# Patient Record
Sex: Female | Born: 1950
Health system: Southern US, Community
[De-identification: ages and names within clinical notes are randomized; demographics above are authoritative.]

## PROBLEM LIST (undated history)

## (undated) DIAGNOSIS — I739 Peripheral vascular disease, unspecified: Secondary | ICD-10-CM

## (undated) DIAGNOSIS — J449 Chronic obstructive pulmonary disease, unspecified: Secondary | ICD-10-CM

## (undated) DIAGNOSIS — I2510 Atherosclerotic heart disease of native coronary artery without angina pectoris: Secondary | ICD-10-CM

## (undated) DIAGNOSIS — E785 Hyperlipidemia, unspecified: Secondary | ICD-10-CM

## (undated) DIAGNOSIS — N189 Chronic kidney disease, unspecified: Secondary | ICD-10-CM

## (undated) DIAGNOSIS — E119 Type 2 diabetes mellitus without complications: Secondary | ICD-10-CM

## (undated) DIAGNOSIS — C55 Malignant neoplasm of uterus, part unspecified: Secondary | ICD-10-CM

## (undated) DIAGNOSIS — I10 Essential (primary) hypertension: Secondary | ICD-10-CM

## (undated) DIAGNOSIS — K317 Polyp of stomach and duodenum: Secondary | ICD-10-CM

## (undated) DIAGNOSIS — K2940 Chronic atrophic gastritis without bleeding: Secondary | ICD-10-CM

## (undated) DIAGNOSIS — G4730 Sleep apnea, unspecified: Secondary | ICD-10-CM

## (undated) DIAGNOSIS — D649 Anemia, unspecified: Secondary | ICD-10-CM

## (undated) DIAGNOSIS — I209 Angina pectoris, unspecified: Secondary | ICD-10-CM

## (undated) DIAGNOSIS — G4733 Obstructive sleep apnea (adult) (pediatric): Secondary | ICD-10-CM

## (undated) HISTORY — DX: Type 2 diabetes mellitus without complications: E11.9

## (undated) HISTORY — DX: Essential (primary) hypertension: I10

## (undated) HISTORY — PX: ESOPHAGOGASTRODUODENOSCOPY: SHX1529

## (undated) HISTORY — DX: Obstructive sleep apnea (adult) (pediatric): G47.33

## (undated) HISTORY — DX: Peripheral vascular disease, unspecified: I73.9

## (undated) HISTORY — DX: Polyp of stomach and duodenum: K31.7

## (undated) HISTORY — DX: Hyperlipidemia, unspecified: E78.5

## (undated) HISTORY — DX: Atherosclerotic heart disease of native coronary artery without angina pectoris: I25.10

## (undated) HISTORY — PX: RENAL ARTERY STENT: SHX2321

## (undated) HISTORY — DX: Malignant neoplasm of uterus, part unspecified: C55

## (undated) HISTORY — DX: Anemia, unspecified: D64.9

## (undated) HISTORY — PX: TOTAL ABDOMINAL HYSTERECTOMY: SHX209

---
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
## 1997-09-10 HISTORY — PX: CORONARY ANGIOPLASTY WITH STENT PLACEMENT: SHX49

## 1998-01-03 ENCOUNTER — Ambulatory Visit (HOSPITAL_COMMUNITY): Admission: RE | Admit: 1998-01-03 | Discharge: 1998-01-03 | Payer: Self-pay | Admitting: Cardiology

## 1998-01-07 ENCOUNTER — Observation Stay (HOSPITAL_COMMUNITY): Admission: AD | Admit: 1998-01-07 | Discharge: 1998-01-08 | Payer: Self-pay | Admitting: Cardiology

## 1998-09-10 HISTORY — PX: COLONOSCOPY: SHX174

## 2001-11-10 ENCOUNTER — Encounter (INDEPENDENT_AMBULATORY_CARE_PROVIDER_SITE_OTHER): Payer: Self-pay | Admitting: *Deleted

## 2001-11-10 LAB — CONVERTED CEMR LAB

## 2003-11-09 ENCOUNTER — Other Ambulatory Visit: Admission: RE | Admit: 2003-11-09 | Discharge: 2003-11-09 | Payer: Self-pay | Admitting: Family Medicine

## 2003-11-11 ENCOUNTER — Other Ambulatory Visit: Admission: RE | Admit: 2003-11-11 | Discharge: 2003-11-11 | Payer: Self-pay | Admitting: Family Medicine

## 2005-01-09 ENCOUNTER — Ambulatory Visit: Payer: Self-pay | Admitting: Internal Medicine

## 2005-01-17 ENCOUNTER — Other Ambulatory Visit: Admission: RE | Admit: 2005-01-17 | Discharge: 2005-01-17 | Payer: Self-pay | Admitting: Family Medicine

## 2005-01-17 ENCOUNTER — Ambulatory Visit: Payer: Self-pay | Admitting: Family Medicine

## 2005-04-20 ENCOUNTER — Ambulatory Visit: Payer: Self-pay | Admitting: Internal Medicine

## 2005-05-24 ENCOUNTER — Ambulatory Visit: Payer: Self-pay | Admitting: Internal Medicine

## 2005-06-12 ENCOUNTER — Encounter: Admission: RE | Admit: 2005-06-12 | Discharge: 2005-09-10 | Payer: Self-pay | Admitting: Internal Medicine

## 2005-07-25 ENCOUNTER — Ambulatory Visit: Payer: Self-pay | Admitting: Family Medicine

## 2005-09-12 ENCOUNTER — Ambulatory Visit: Payer: Self-pay | Admitting: Internal Medicine

## 2005-10-23 ENCOUNTER — Ambulatory Visit: Payer: Self-pay | Admitting: Internal Medicine

## 2005-10-31 ENCOUNTER — Ambulatory Visit: Payer: Self-pay

## 2005-12-30 ENCOUNTER — Emergency Department (HOSPITAL_COMMUNITY): Admission: EM | Admit: 2005-12-30 | Discharge: 2005-12-30 | Payer: Self-pay | Admitting: Emergency Medicine

## 2006-01-02 ENCOUNTER — Ambulatory Visit: Payer: Self-pay | Admitting: Internal Medicine

## 2006-01-04 ENCOUNTER — Ambulatory Visit: Payer: Self-pay | Admitting: Internal Medicine

## 2006-02-06 ENCOUNTER — Encounter: Admission: RE | Admit: 2006-02-06 | Discharge: 2006-02-06 | Payer: Self-pay | Admitting: Cardiovascular Disease

## 2006-02-12 ENCOUNTER — Inpatient Hospital Stay (HOSPITAL_COMMUNITY): Admission: AD | Admit: 2006-02-12 | Discharge: 2006-02-13 | Payer: Self-pay | Admitting: Cardiovascular Disease

## 2006-02-22 ENCOUNTER — Ambulatory Visit: Payer: Self-pay | Admitting: Internal Medicine

## 2006-02-28 ENCOUNTER — Ambulatory Visit: Payer: Self-pay | Admitting: Internal Medicine

## 2006-06-20 ENCOUNTER — Ambulatory Visit: Payer: Self-pay | Admitting: Internal Medicine

## 2006-08-14 ENCOUNTER — Encounter: Admission: RE | Admit: 2006-08-14 | Discharge: 2006-08-14 | Payer: Self-pay | Admitting: Cardiovascular Disease

## 2006-08-20 ENCOUNTER — Ambulatory Visit (HOSPITAL_COMMUNITY): Admission: RE | Admit: 2006-08-20 | Discharge: 2006-08-21 | Payer: Self-pay | Admitting: Cardiovascular Disease

## 2006-08-28 ENCOUNTER — Ambulatory Visit: Payer: Self-pay | Admitting: Internal Medicine

## 2006-08-28 LAB — CONVERTED CEMR LAB
BUN: 20 mg/dL (ref 6–23)
Creatinine, Ser: 1.4 mg/dL — ABNORMAL HIGH (ref 0.4–1.2)
Potassium: 4.2 meq/L (ref 3.5–5.1)

## 2006-09-05 ENCOUNTER — Ambulatory Visit: Payer: Self-pay | Admitting: Internal Medicine

## 2006-10-21 ENCOUNTER — Ambulatory Visit: Payer: Self-pay | Admitting: Internal Medicine

## 2006-10-21 LAB — CONVERTED CEMR LAB: Hgb A1c MFr Bld: 5.7 % (ref 4.6–6.0)

## 2006-10-31 ENCOUNTER — Ambulatory Visit: Payer: Self-pay | Admitting: Internal Medicine

## 2007-03-25 ENCOUNTER — Encounter: Payer: Self-pay | Admitting: Internal Medicine

## 2007-04-10 ENCOUNTER — Encounter: Payer: Self-pay | Admitting: Internal Medicine

## 2007-04-22 ENCOUNTER — Encounter: Admission: RE | Admit: 2007-04-22 | Discharge: 2007-04-22 | Payer: Self-pay | Admitting: Cardiovascular Disease

## 2007-04-28 ENCOUNTER — Inpatient Hospital Stay (HOSPITAL_COMMUNITY): Admission: AD | Admit: 2007-04-28 | Discharge: 2007-04-29 | Payer: Self-pay | Admitting: Cardiovascular Disease

## 2007-05-21 ENCOUNTER — Encounter: Payer: Self-pay | Admitting: Internal Medicine

## 2007-06-04 ENCOUNTER — Encounter: Payer: Self-pay | Admitting: Internal Medicine

## 2007-07-07 DIAGNOSIS — E1151 Type 2 diabetes mellitus with diabetic peripheral angiopathy without gangrene: Secondary | ICD-10-CM | POA: Insufficient documentation

## 2007-07-07 DIAGNOSIS — Z8542 Personal history of malignant neoplasm of other parts of uterus: Secondary | ICD-10-CM | POA: Insufficient documentation

## 2007-07-07 DIAGNOSIS — D649 Anemia, unspecified: Secondary | ICD-10-CM | POA: Insufficient documentation

## 2007-07-07 DIAGNOSIS — I10 Essential (primary) hypertension: Secondary | ICD-10-CM

## 2007-07-24 ENCOUNTER — Encounter: Payer: Self-pay | Admitting: Internal Medicine

## 2007-09-18 ENCOUNTER — Encounter: Payer: Self-pay | Admitting: Internal Medicine

## 2007-10-09 ENCOUNTER — Encounter (INDEPENDENT_AMBULATORY_CARE_PROVIDER_SITE_OTHER): Payer: Self-pay | Admitting: *Deleted

## 2007-10-09 DIAGNOSIS — I219 Acute myocardial infarction, unspecified: Secondary | ICD-10-CM | POA: Insufficient documentation

## 2007-10-09 DIAGNOSIS — N259 Disorder resulting from impaired renal tubular function, unspecified: Secondary | ICD-10-CM

## 2007-12-07 ENCOUNTER — Telehealth (INDEPENDENT_AMBULATORY_CARE_PROVIDER_SITE_OTHER): Payer: Self-pay | Admitting: *Deleted

## 2007-12-10 ENCOUNTER — Telehealth (INDEPENDENT_AMBULATORY_CARE_PROVIDER_SITE_OTHER): Payer: Self-pay | Admitting: *Deleted

## 2008-02-03 ENCOUNTER — Encounter: Payer: Self-pay | Admitting: Internal Medicine

## 2008-04-06 ENCOUNTER — Ambulatory Visit: Payer: Self-pay | Admitting: Internal Medicine

## 2008-04-06 DIAGNOSIS — R0989 Other specified symptoms and signs involving the circulatory and respiratory systems: Secondary | ICD-10-CM

## 2008-04-06 DIAGNOSIS — R609 Edema, unspecified: Secondary | ICD-10-CM

## 2008-04-07 ENCOUNTER — Ambulatory Visit: Payer: Self-pay | Admitting: Internal Medicine

## 2008-04-10 LAB — CONVERTED CEMR LAB
BUN: 16 mg/dL (ref 6–23)
Basophils Absolute: 0.1 10*3/uL (ref 0.0–0.1)
Basophils Relative: 1.6 % (ref 0.0–3.0)
Eosinophils Absolute: 0.2 10*3/uL (ref 0.0–0.7)
Hemoglobin: 12.7 g/dL (ref 12.0–15.0)
MCHC: 33.6 g/dL (ref 30.0–36.0)
MCV: 87 fL (ref 78.0–100.0)
Monocytes Absolute: 0.3 10*3/uL (ref 0.1–1.0)
Neutro Abs: 1.2 10*3/uL — ABNORMAL LOW (ref 1.4–7.7)
RBC: 4.34 M/uL (ref 3.87–5.11)
TSH: 1.6 microintl units/mL (ref 0.35–5.50)

## 2008-04-12 ENCOUNTER — Encounter (INDEPENDENT_AMBULATORY_CARE_PROVIDER_SITE_OTHER): Payer: Self-pay | Admitting: *Deleted

## 2008-04-21 ENCOUNTER — Ambulatory Visit: Payer: Self-pay | Admitting: Internal Medicine

## 2008-04-21 ENCOUNTER — Encounter (INDEPENDENT_AMBULATORY_CARE_PROVIDER_SITE_OTHER): Payer: Self-pay | Admitting: *Deleted

## 2008-04-21 LAB — CONVERTED CEMR LAB: OCCULT 3: NEGATIVE

## 2008-05-28 ENCOUNTER — Encounter: Payer: Self-pay | Admitting: Internal Medicine

## 2008-06-24 ENCOUNTER — Encounter: Payer: Self-pay | Admitting: Internal Medicine

## 2008-06-28 ENCOUNTER — Telehealth (INDEPENDENT_AMBULATORY_CARE_PROVIDER_SITE_OTHER): Payer: Self-pay | Admitting: *Deleted

## 2008-07-15 ENCOUNTER — Encounter: Payer: Self-pay | Admitting: Internal Medicine

## 2008-07-26 ENCOUNTER — Ambulatory Visit: Payer: Self-pay | Admitting: Internal Medicine

## 2008-08-01 LAB — CONVERTED CEMR LAB
BUN: 12 mg/dL (ref 6–23)
Creatinine,U: 91 mg/dL
Microalb, Ur: 0.2 mg/dL (ref 0.0–1.9)

## 2008-08-02 ENCOUNTER — Ambulatory Visit: Payer: Self-pay | Admitting: Internal Medicine

## 2008-11-17 ENCOUNTER — Encounter: Payer: Self-pay | Admitting: Internal Medicine

## 2008-11-17 ENCOUNTER — Ambulatory Visit: Payer: Self-pay | Admitting: Internal Medicine

## 2008-11-30 ENCOUNTER — Ambulatory Visit: Payer: Self-pay | Admitting: Internal Medicine

## 2008-12-07 LAB — CONVERTED CEMR LAB
Creatinine,U: 69.9 mg/dL
Hgb A1c MFr Bld: 6.2 % (ref 4.6–6.5)
Microalb, Ur: 0.2 mg/dL (ref 0.0–1.9)
Potassium: 4.3 meq/L (ref 3.5–5.1)

## 2008-12-09 ENCOUNTER — Encounter (INDEPENDENT_AMBULATORY_CARE_PROVIDER_SITE_OTHER): Payer: Self-pay | Admitting: *Deleted

## 2008-12-30 ENCOUNTER — Encounter: Payer: Self-pay | Admitting: Internal Medicine

## 2009-01-25 ENCOUNTER — Telehealth (INDEPENDENT_AMBULATORY_CARE_PROVIDER_SITE_OTHER): Payer: Self-pay | Admitting: *Deleted

## 2009-04-04 ENCOUNTER — Ambulatory Visit: Payer: Self-pay | Admitting: Internal Medicine

## 2009-04-05 ENCOUNTER — Encounter (INDEPENDENT_AMBULATORY_CARE_PROVIDER_SITE_OTHER): Payer: Self-pay | Admitting: *Deleted

## 2009-04-11 ENCOUNTER — Encounter (INDEPENDENT_AMBULATORY_CARE_PROVIDER_SITE_OTHER): Payer: Self-pay | Admitting: *Deleted

## 2009-04-11 LAB — CONVERTED CEMR LAB
Creatinine, Ser: 1.3 mg/dL — ABNORMAL HIGH (ref 0.4–1.2)
Hgb A1c MFr Bld: 6 % (ref 4.6–6.5)
Potassium: 4 meq/L (ref 3.5–5.1)

## 2009-06-06 ENCOUNTER — Encounter: Payer: Self-pay | Admitting: Internal Medicine

## 2009-06-23 ENCOUNTER — Encounter: Payer: Self-pay | Admitting: Internal Medicine

## 2009-10-03 ENCOUNTER — Ambulatory Visit: Payer: Self-pay | Admitting: Internal Medicine

## 2009-10-07 LAB — CONVERTED CEMR LAB
Creatinine, Ser: 1.3 mg/dL — ABNORMAL HIGH (ref 0.4–1.2)
Creatinine,U: 41.6 mg/dL
Hgb A1c MFr Bld: 6.3 % (ref 4.6–6.5)
Microalb, Ur: 0.2 mg/dL (ref 0.0–1.9)

## 2009-10-14 ENCOUNTER — Ambulatory Visit: Payer: Self-pay | Admitting: Internal Medicine

## 2009-10-14 DIAGNOSIS — F17200 Nicotine dependence, unspecified, uncomplicated: Secondary | ICD-10-CM | POA: Insufficient documentation

## 2010-01-11 ENCOUNTER — Encounter: Payer: Self-pay | Admitting: Internal Medicine

## 2010-02-09 ENCOUNTER — Telehealth (INDEPENDENT_AMBULATORY_CARE_PROVIDER_SITE_OTHER): Payer: Self-pay | Admitting: *Deleted

## 2010-02-16 ENCOUNTER — Ambulatory Visit: Payer: Self-pay | Admitting: Internal Medicine

## 2010-02-16 LAB — CONVERTED CEMR LAB
ALT: 16 units/L (ref 0–35)
BUN: 15 mg/dL (ref 6–23)
Bilirubin, Direct: 0.1 mg/dL (ref 0.0–0.3)
CO2: 30 meq/L (ref 19–32)
Calcium: 9.3 mg/dL (ref 8.4–10.5)
Cholesterol: 156 mg/dL (ref 0–200)
GFR calc non Af Amer: 57.64 mL/min (ref >60)
Glucose, Bld: 106 mg/dL — ABNORMAL HIGH (ref 70–99)
HDL: 63.7 mg/dL (ref >39.00)
LDL Cholesterol: 77 mg/dL (ref 0–99)
Microalb Creat Ratio: 0.2 mg/g (ref 0.0–30.0)
Total Bilirubin: 0.6 mg/dL (ref 0.3–1.2)
Total CHOL/HDL Ratio: 2
Triglycerides: 76 mg/dL (ref 0.0–149.0)

## 2010-02-23 ENCOUNTER — Ambulatory Visit: Payer: Self-pay | Admitting: Internal Medicine

## 2010-02-23 DIAGNOSIS — K219 Gastro-esophageal reflux disease without esophagitis: Secondary | ICD-10-CM

## 2010-06-09 ENCOUNTER — Encounter: Payer: Self-pay | Admitting: Internal Medicine

## 2010-08-23 ENCOUNTER — Ambulatory Visit: Payer: Self-pay | Admitting: Internal Medicine

## 2010-08-29 LAB — CONVERTED CEMR LAB
Hgb A1c MFr Bld: 6.5 % (ref 4.6–6.5)
Potassium: 4.6 meq/L (ref 3.5–5.1)

## 2010-08-30 ENCOUNTER — Ambulatory Visit: Payer: Self-pay | Admitting: Internal Medicine

## 2010-09-19 ENCOUNTER — Encounter: Payer: Self-pay | Admitting: Cardiology

## 2010-09-19 ENCOUNTER — Ambulatory Visit
Admission: RE | Admit: 2010-09-19 | Discharge: 2010-09-19 | Payer: Self-pay | Source: Home / Self Care | Attending: Cardiology | Admitting: Cardiology

## 2010-09-19 DIAGNOSIS — I2510 Atherosclerotic heart disease of native coronary artery without angina pectoris: Secondary | ICD-10-CM | POA: Insufficient documentation

## 2010-10-10 NOTE — Letter (Signed)
Summary: [facility]  [facility]   Imported By: Lanelle Bal 01/25/2010 10:08:59  _____________________________________________________________________  External Attachment:    Type:   Image     Comment:   External Document

## 2010-10-10 NOTE — Assessment & Plan Note (Signed)
Summary: rov by pt..lh   Vital Signs:  Patient profile:   60 year old female Weight:      179.6 pounds BMI:     31.68 Pulse rate:   64 / minute Resp:     17 per minute BP sitting:   130 / 68  (left arm) Cuff size:   large  Vitals Entered By: Shonna Chock (October 14, 2009 2:58 PM) CC: Follow-up visit: Discuss labs  Comments REVIEWED MED LIST, PATIENT AGREED DOSE AND INSTRUCTION CORRECT    CC:  Follow-up visit: Discuss labs .  History of Present Illness: Labs reviewed & risks discussed. She is not checking glucoses because no  working machine .She states she only smoking 2 / day  but she lives with smoker.  Allergies (verified): No Known Drug Allergies  Review of Systems General:  Denies fatigue and weight loss. CV:  Denies chest pain or discomfort, lightheadness, and near fainting. Derm:  Denies poor wound healing. Neuro:  Denies numbness and tingling. Endo:  Denies excessive hunger, excessive thirst, and excessive urination.  Physical Exam  General:  well-nourished,in no acute distress; alert,appropriate and cooperative throughout examination; heavy tobacco odor  Lungs:  Normal respiratory effort, chest expands symmetrically. Lungs are clear to auscultation, no crackles or wheezes. Heart:  Normal rate and regular rhythm. S1 and S2 normal without gallop, murmur, click, rub. S4 with slurring Abdomen:  Bowel sounds positive,abdomen soft and non-tender without masses, organomegaly or hernias noted. No AAA or bruits Pulses:  R and L carotid,radial,dorsalis pedis and posterior tibial pulses are full and equal bilaterally. Bilateral carotid bruits Extremities:  No clubbing, cyanosis, edema.   Impression & Recommendations:  Problem # 1:  Hx of RENAL INSUFFICIENCY (ICD-588.9)  stable  Orders: Tobacco use cessation intermediate 3-10 minutes (04540)  Problem # 2:  DIABETES MELLITUS, TYPE II (ICD-250.00)  Slight progression Her updated medication list for this problem  includes:    Metformin Hcl 500 Mg Xr24h-tab (Metformin hcl) .Marland KitchenMarland KitchenMarland KitchenMarland Kitchen 1by mouth  once  a day with largest meal    Glimepiride 2 Mg Tabs (Glimepiride) .Marland Kitchen... 1/2  by mouth once daily    Baby Aspirin 81 Mg Chew (Aspirin) .Marland Kitchen... Chew 1 tablet by mouth qd    Altace 10 Mg Tabs (Ramipril) .Marland Kitchen... 1 by mouth once daily  Orders: Tobacco use cessation intermediate 3-10 minutes (98119)  Problem # 3:  HYPERTENSION (ICD-401.9)  controlled Her updated medication list for this problem includes:    Metoprolol Tartrate 25 Mg Tabs (Metoprolol tartrate) .Marland Kitchen... 1 by mouth two times a day    Altace 10 Mg Tabs (Ramipril) .Marland Kitchen... 1 by mouth once daily  Orders: Tobacco use cessation intermediate 3-10 minutes (14782)  Complete Medication List: 1)  Metformin Hcl 500 Mg Xr24h-tab (Metformin hcl) .Marland Kitchen.. 1by mouth  once  a day with largest meal 2)  Glimepiride 2 Mg Tabs (Glimepiride) .... 1/2  by mouth once daily 3)  Multivitamins Tabs (Multiple vitamin) .... Take 1 tablet by mouth daily 4)  Baby Aspirin 81 Mg Chew (Aspirin) .... Chew 1 tablet by mouth qd 5)  Metoprolol Tartrate 25 Mg Tabs (Metoprolol tartrate) .Marland Kitchen.. 1 by mouth two times a day 6)  Altace 10 Mg Tabs (Ramipril) .Marland Kitchen.. 1 by mouth once daily 7)  Plavix 75 Mg Tabs (Clopidogrel bisulfate) 8)  Simvastatin 80 Mg Tabs (Simvastatin) .Marland Kitchen.. 1 by mouth at bedtime 9)  Caltrate  10)  Niacin Cr 500 Mg Cr-caps (Niacin) .Marland Kitchen.. 1 by mouth at bedtime  Patient  Instructions: 1)  Eat brown carbs (yams, wheat pasta , wild rice & whole grained breads)  instead of whites (potatoes, rice , bread & pasta). Avoid ALL foods & drinks with High Fructose Corn Syrup as #1,2 or #3 on label. 2)  Please schedule a follow-up appointment in 4 months. 3)  BUN,creat, K+ prior to visit, ICD-9: 4)  HbgA1C prior to visit, ICD-9: 5)  Urine Microalbumin prior to visit, ICD-9: 6)  Stop Smoking Tips: Choose a Quit date. Cut down before the Quit date. decide what you will do as a substitute when you feel  the urge to smoke(gum,toothpick,exercise). Prescriptions: GLIMEPIRIDE 2 MG  TABS (GLIMEPIRIDE) 1/2  by mouth once daily  #90 Tablet x 0   Entered and Authorized by:   Marga Melnick MD   Signed by:   Marga Melnick MD on 10/14/2009   Method used:   Print then Give to Patient   RxID:   [id number] METFORMIN HCL 500 MG XR24H-TAB (METFORMIN HCL) 1BY MOUTH  ONCE  a day WITH LARGEST MEAL  #180 x 1   Entered and Authorized by:   Marga Melnick MD   Signed by:   Marga Melnick MD on 10/14/2009   Method used:   Print then Give to Patient   RxID:   [id number]

## 2010-10-10 NOTE — Assessment & Plan Note (Signed)
Summary: 4 mth fu/kdc   Vital Signs:  Patient profile:   60 year old female Weight:      179.2 pounds Pulse rate:   72 / minute Resp:     16 per minute BP sitting:   148 / 70  (left arm) Cuff size:   large  Vitals Entered By: Shonna Chock (February 23, 2010 10:16 AM) CC: 4 Month follow-up (copy of labs given), Hypertension Management Comments REVIEWED MED LIST, PATIENT AGREED DOSE AND INSTRUCTION CORRECT    CC:  4 Month follow-up (copy of labs given) and Hypertension Management.  History of Present Illness: Labs reviewed & risks discussed. A1c is 6.1%; it was 6.3% in 09/2009. Creat now 1.2 ; it was 1.3. Dr Allyson Sabal changed Altace to Lisinopril 10 mg. Some "heartburn " X 3 weeks intermittently. Triggers for ERD reviewed.  Hypertension History:      She denies headache, chest pain, palpitations, dyspnea with exertion, orthopnea, PND, peripheral edema, visual symptoms, neurologic problems, and syncope.        Positive major cardiovascular risk factors include female age 72 years old or older, diabetes, hypertension, and current tobacco user.        Positive history for target organ damage include ASHD (either angina/prior MI/prior CABG).     Preventive Screening-Counseling & Management  Alcohol-Tobacco     Smoking Status: current     Smoking Cessation Counseling: yes     Packs/Day: <0.25     Tobacco Counseling: to quit use of tobacco products  Caffeine-Diet-Exercise     Caffeine use/day: 2-3 cups / day  Comments: Cardiovascular  & GI risks discussed  Allergies (verified): No Known Drug Allergies  Social History: Packs/Day:  <0.25 Caffeine use/day:  2-3 cups / day  Review of Systems Eyes:  Denies blurring, double vision, and vision loss-both eyes. ENT:  Denies difficulty swallowing and hoarseness. CV:  Denies leg cramps with exertion, lightheadness, and near fainting. GI:  Complains of indigestion; denies abdominal pain, bloody stools, and dark tarry stools; Vinegar taken as  needed for dyspepsia. Derm:  Denies poor wound healing. Neuro:  Denies numbness and tingling.  Physical Exam  General:  well-nourished,in no acute distress; alert,appropriate and cooperative throughout examination Eyes:  No corneal or conjunctival inflammation noted.No icterus Lungs:  Normal respiratory effort, chest expands symmetrically. Lungs are clear to auscultation, no crackles or wheezes. Heart:  Normal rate and regular rhythm. S1 and S2 normal without gallop, murmur, click, rub . S4 Abdomen:  Bowel sounds positive,abdomen soft and non-tender without masses, organomegaly or hernias noted. Pulses:  R and L carotid,radial  pulses are full and equal bilaterally. Pedal pulses decreased Extremities:  No clubbing, cyanosis, edema. Neurologic:  alert & oriented X3 and sensation intact to light touch over feet .   Skin:  Intact without suspicious lesions or rashes Cervical Nodes:  No lymphadenopathy noted Axillary Nodes:  No palpable lymphadenopathy Psych:  memory intact for recent and remote, normally interactive, and good eye contact.     Impression & Recommendations:  Problem # 1:  DIABETES MELLITUS, TYPE II (ICD-250.00)  The following medications were removed from the medication list:    Glimepiride 2 Mg Tabs (Glimepiride) .Marland Kitchen... 1/2  by mouth once daily    Altace 10 Mg Tabs (Ramipril) .Marland Kitchen... 1 by mouth once daily Her updated medication list for this problem includes:    Metformin Hcl 500 Mg Xr24h-tab (Metformin hcl) .Marland KitchenMarland KitchenMarland KitchenMarland Kitchen 1by mouth  once  a day with largest meal    Baby  Aspirin 81 Mg Chew (Aspirin) .Marland Kitchen... Chew 1 tablet by mouth qd    Lisinopril 20 Mg Tabs (Lisinopril) .Marland Kitchen... 1 once daily  Problem # 2:  Hx of RENAL INSUFFICIENCY (ICD-588.9) resolved  Problem # 3:  HYPERTENSION (ICD-401.9) suboptimal control The following medications were removed from the medication list:    Altace 10 Mg Tabs (Ramipril) .Marland Kitchen... 1 by mouth once daily Her updated medication list for this problem  includes:    Metoprolol Tartrate 25 Mg Tabs (Metoprolol tartrate) .Marland Kitchen... 1 by mouth two times a day    Lisinopril 20 Mg Tabs (Lisinopril) .Marland Kitchen... 1 once daily  Problem # 4:  GERD (ICD-530.81)  Her updated medication list for this problem includes:    Ranitidine Hcl 150 Mg Tabs (Ranitidine hcl) .Marland Kitchen... 1 every 12 hrs pre meals as needed for heartburn  Problem # 5:  NONDEPENDENT TOBACCO USE DISORDER (ICD-305.1)  Complete Medication List: 1)  Metformin Hcl 500 Mg Xr24h-tab (Metformin hcl) .Marland Kitchen.. 1by mouth  once  a day with largest meal 2)  Multivitamins Tabs (Multiple vitamin) .... Take 1 tablet by mouth daily 3)  Baby Aspirin 81 Mg Chew (Aspirin) .... Chew 1 tablet by mouth qd 4)  Metoprolol Tartrate 25 Mg Tabs (Metoprolol tartrate) .Marland Kitchen.. 1 by mouth two times a day 5)  Plavix 75 Mg Tabs (Clopidogrel bisulfate) 6)  Simvastatin 80 Mg Tabs (Simvastatin) .Marland Kitchen.. 1 by mouth at bedtime 7)  Caltrate  8)  Niacin Cr 500 Mg Cr-caps (Niacin) .Marland Kitchen.. 1 by mouth at bedtime 9)  Lisinopril 20 Mg Tabs (Lisinopril) .Marland Kitchen.. 1 once daily 10)  Ranitidine Hcl 150 Mg Tabs (Ranitidine hcl) .Marland Kitchen.. 1 every 12 hrs pre meals as needed for heartburn  Hypertension Assessment/Plan:      The patient's hypertensive risk group is category C: Target organ damage and/or diabetes.  Today's blood pressure is 148/70.    Patient Instructions: 1)  Please schedule a follow-up appointment in 6 months. 2)  Tobacco is very bad for your health and your loved ones as we discussed 3)  Stop Smoking Tips: Choose a Quit date. Cut down before the Quit date. decide what you will do as a substitute when you feel the urge to smoke(gum,toothpick,exercise). 4)  BUN,creat, K+ prior to visit, ICD-9:401.9 5)  HbgA1C prior to visit, ICD-9:250.00 6)  Avoid foods high in acid (tomatoes, citrus juices, spicy foods). Avoid eating within two hours of lying down or before exercising. Do not over eat; try smaller more frequent meals. Elevate head of bed twelve inches  when sleeping. Prescriptions: RANITIDINE HCL 150 MG TABS (RANITIDINE HCL) 1 every 12 hrs pre meals as needed for heartburn  #60 x 1   Entered and Authorized by:   Marga Melnick MD   Signed by:   Marga Melnick MD on 02/23/2010   Method used:   Print then Give to Patient   RxID:   [id number] LISINOPRIL 20 MG TABS (LISINOPRIL) 1 once daily  #90 x 1   Entered and Authorized by:   Marga Melnick MD   Signed by:   Marga Melnick MD on 02/23/2010   Method used:   Print then Give to Patient   RxID:   [id number]

## 2010-10-10 NOTE — Progress Notes (Signed)
Summary: Refill Request  Phone Note Refill Request Call back at [phone] Message from:  Pharmacy on February 09, 2010 2:44 PM  Refills Requested: Medication #1:  GLIMEPIRIDE 2 MG  TABS 1/2  by mouth once daily   Dosage confirmed as above?Dosage Confirmed   Supply Requested: 1 month   Last Refilled: 11/14/2009 [facility] on [address] in [employer]  Next Appointment Scheduled: 6.16.11 Initial call taken by: Harold Barban,  February 09, 2010 2:44 PM    Prescriptions: GLIMEPIRIDE 2 MG  TABS (GLIMEPIRIDE) 1/2  by mouth once daily  #90 Tablet x 0   Entered by:   Shonna Chock   Authorized by:   Marga Melnick MD   Signed by:   Shonna Chock on 02/09/2010   Method used:   Electronically to        [facility] [id number] (retail)       [address] [place], [place]  [address]       Ph: [phone] or [phone]       Fax: [phone]   RxID:   [id number]

## 2010-10-12 NOTE — Assessment & Plan Note (Signed)
Summary: rto 6 months/cbs   Vital Signs:  Patient profile:   60 year old female Weight:      175.2 pounds BMI:     30.90 Pulse rate:   76 / minute Resp:     15 per minute BP sitting:   130 / 68  (left arm) Cuff size:   large  Vitals Entered By: Shonna Chock CMA (August 30, 2010 9:48 AM) CC: 6 month follow-up (copy of labs given) , Type 2 diabetes mellitus follow-up   CC:  6 month follow-up (copy of labs given)  and Type 2 diabetes mellitus follow-up.  History of Present Illness: Type 2 Diabetes Mellitus Follow-Up      This is a 60 year old woman who presents for Type 2 diabetes mellitus follow-up.  The patient denies polyuria, polydipsia, blurred vision, self managed hypoglycemia, weight loss, weight gain, and numbness of extremities.  The patient denies the following symptoms: neuropathic pain, chest pain, orthostatic symptoms, poor wound healing, intermittent claudication, vision loss, and foot ulcer.  Since the last visit the patient reports not exercising regularly.  The patient has been measuring capillary blood glucose before breakfast, range 130-140.  A1c was 6.1% ( 129, 22% risk) ; it is now 6.5% ( 140, 30% risk).She has stopped her Plavix due to cost ; [facility]  has discharged her due to outstanding balance.   Current Medications (verified): 1)  Metformin Hcl 500 Mg Xr24h-Tab (Metformin Hcl) .Marland Kitchen.. 1by Mouth  Once  A Day With Largest Meal 2)  Multivitamins   Tabs (Multiple Vitamin) .... Take 1 Tablet By Mouth Daily 3)  Baby Aspirin 81 Mg  Chew (Aspirin) .... Chew 1 Tablet By Mouth Qd 4)  Metoprolol Tartrate 25 Mg  Tabs (Metoprolol Tartrate) .Marland Kitchen.. 1 By Mouth Two Times A Day 5)  Plavix 75 Mg  Tabs (Clopidogrel Bisulfate) 6)  Simvastatin 80 Mg Tabs (Simvastatin) .Marland Kitchen.. 1 By Mouth At Bedtime 7)  Caltrate 8)  Niacin Cr 500 Mg Cr-Caps (Niacin) .Marland Kitchen.. 1 By Mouth At Bedtime 9)  Lisinopril 20 Mg Tabs (Lisinopril) .Marland Kitchen.. 1 Once Daily 10)  Ranitidine Hcl 150 Mg Tabs (Ranitidine Hcl) .Marland Kitchen..  1 Every 12 Hrs Pre Meals As Needed For Heartburn  Allergies (verified): No Known Drug Allergies  Physical Exam  General:  well-nourished,in no acute distress; alert,appropriate and cooperative throughout examination Lungs:  Normal respiratory effort, chest expands symmetrically. Lungs are clear to auscultation, no crackles or wheezes. Heart:  normal rate, regular rhythm, no gallop, no rub, no JVD, and grade 1 /6 systolic murmur.   Pulses:  R and L carotid,radial,dorsalis pedis and posterior tibial pulses are full and equal bilaterally. bilateral carotid bruits Extremities:  No clubbing, cyanosis, edema.Note : seen by Podiatry every 3 months) Neurologic:  alert & oriented X3 and sensation intact to light touch over feet.     Impression & Recommendations:  Problem # 1:  DIABETES MELLITUS, TYPE II (ICD-250.00)  Her updated medication list for this problem includes:    Metformin Hcl 500 Mg Xr24h-tab (Metformin hcl) .Marland KitchenMarland KitchenMarland KitchenMarland Kitchen 1by mouth  once  a day with largest meal    Baby Aspirin 81 Mg Chew (Aspirin) .Marland Kitchen... Chew 1 tablet by mouth qd    Lisinopril 20 Mg Tabs (Lisinopril) .Marland Kitchen... 1 once daily  Problem # 2:  NONDEPENDENT TOBACCO USE DISORDER (ICD-305.1) risk discussed  Problem # 3:  Hx of AMI (ICD-410.90)  discharged from [facility]; she needs new Cardiologist Her updated medication list for this problem includes:  Baby Aspirin 81 Mg Chew (Aspirin) .Marland Kitchen... Chew 1 tablet by mouth qd    Metoprolol Tartrate 25 Mg Tabs (Metoprolol tartrate) .Marland Kitchen... 1 by mouth two times a day    Plavix 75 Mg Tabs (Clopidogrel bisulfate)    Lisinopril 20 Mg Tabs (Lisinopril) .Marland Kitchen... 1 once daily  Orders: Cardiology Referral (Cardiology)  Complete Medication List: 1)  Metformin Hcl 500 Mg Xr24h-tab (Metformin hcl) .Marland Kitchen.. 1by mouth  once  a day with largest meal 2)  Multivitamins Tabs (Multiple vitamin) .... Take 1 tablet by mouth daily 3)  Baby Aspirin 81 Mg Chew (Aspirin) .... Chew 1 tablet by mouth qd 4)   Metoprolol Tartrate 25 Mg Tabs (Metoprolol tartrate) .Marland Kitchen.. 1 by mouth two times a day 5)  Plavix 75 Mg Tabs (Clopidogrel bisulfate) 6)  Simvastatin 80 Mg Tabs (Simvastatin) .Marland Kitchen.. 1 by mouth at bedtime 7)  Caltrate  8)  Niacin Cr 500 Mg Cr-caps (Niacin) .Marland Kitchen.. 1 by mouth at bedtime 9)  Lisinopril 20 Mg Tabs (Lisinopril) .Marland Kitchen.. 1 once daily 10)  Ranitidine Hcl 150 Mg Tabs (Ranitidine hcl) .Marland Kitchen.. 1 every 12 hrs pre meals as needed for heartburn  Patient Instructions: 1)  Stay on Metformin once daily with largest meal. Avoid sugar from High Fructose Corn Syrup sources as discussed. 2)  Please schedule a follow-up appointment in 4 months. 3)  HbgA1C prior to visit, ICD-9:250.00 4)  Urine Microalbumin prior to visit, ICD-9:250.00 5)  Stop Smoking Tips: Choose a Quit date. Cut down before the Quit date. decide what you will do as a substitute when you feel the urge to smoke(gum,toothpick,exercise).Smoking raises heart attack risk 2-3X normal. 6)  Take 325 mg coated  Aspirin every day since you have stopped your Plavix   Orders Added: 1)  Est. Patient Level III [14782] 2)  Cardiology Referral [Cardiology]

## 2010-10-12 NOTE — Assessment & Plan Note (Signed)
Summary: acute mi/mt   Visit Type:  Initial Consult Primary Provider:  Marga Melnick MD  CC:  CAD.  History of Present Illness: The patient presents as a new patient. She has been cared for by another cardiology service. She has a history of peripheral vascular disease and coronary disease. She has been getting along quite well cervix last catheterization in 2007. She doesn't exercise routinely but she had been at work and doing household chores. With this she denies any chest pressure, neck or arm discomfort. She does not have palpitations, presyncope or syncope. She has no shortness of breath, PND or orthopnea. She has no weight gain or edema.  She does have good lipid control as described below. Her diabetes is also well controlled. Unfortunately she continues to smoke cigarettes.  Current Medications (verified): 1)  Metformin Hcl 500 Mg Xr24h-Tab (Metformin Hcl) .Marland Kitchen.. 1by Mouth  Once  A Day With Largest Meal 2)  Multivitamins   Tabs (Multiple Vitamin) .... Take 1 Tablet By Mouth Daily 3)  Baby Aspirin 81 Mg  Chew (Aspirin) .... Chew 1 Tablet By Mouth Qd 4)  Metoprolol Tartrate 25 Mg  Tabs (Metoprolol Tartrate) .Marland Kitchen.. 1 By Mouth Two Times A Day 5)  Simvastatin 80 Mg Tabs (Simvastatin) .Marland Kitchen.. 1 By Mouth At Bedtime 6)  Caltrate 7)  Niacin Cr 500 Mg Cr-Caps (Niacin) .Marland Kitchen.. 1 By Mouth At Bedtime 8)  Lisinopril 20 Mg Tabs (Lisinopril) .Marland Kitchen.. 1 Once Daily 9)  Ranitidine Hcl 150 Mg Tabs (Ranitidine Hcl) .Marland Kitchen.. 1 Every 12 Hrs Pre Meals As Needed For Heartburn  Allergies (verified): No Known Drug Allergies  Past History:  Past Medical History: Reviewed history from 03/30/2008 and no changes required. Anemia-NOS Diabetes mellitus, type II Hypertension Uterine CA Left bruit w/ normal doppler  Past Surgical History: Reviewed history from 04/06/2008 and no changes required. Hysterectomy & BSO for uterine CA Right renal art  stent (May 2007) Right renal art stent for restenosis (Dec. 2007) RCA  stent (1999), Dr Elsie Lincoln G4 P4 Colonoscopy neg. (2000)  Family History: Reviewed history from 10/09/2007 and no changes required. Father: HTN Mother: died April 23, 2003 CHF after hip fx., DM, HTN, DVT Siblings: brother- IDDM, HTN, Emphysema, smoker                sister- IDDM MGM: NIDDM Maternal aunt:  NIDDM Paternal aunt:  MI  Social History: Current Smoker; no diet  Review of Systems       As stated in the HPI and negative for all other systems.   Vital Signs:  Patient profile:   60 year old female Height:      63.25 inches Weight:      170 pounds BMI:     29.98 Pulse rate:   89 / minute Resp:     16 per minute BP sitting:   164 / 70  (right arm)  Vitals Entered By: Marrion Coy, CNA (September 19, 2010 10:49 AM)  Physical Exam  General:  Well developed, well nourished, in no acute distress. Head:  normocephalic and atraumatic Eyes:  PERRLA/EOM intact; conjunctiva and lids normal. Mouth:  Teeth, gums and palate normal. Oral mucosa normal. Neck:  Neck supple, no JVD. No masses, thyromegaly or abnormal cervical nodes. Chest Wall:  no deformities or breast masses noted Lungs:  Clear bilaterally to auscultation and percussion. Abdomen:  Bowel sounds positive,abdomen soft and non-tender without masses, organomegaly or hernias noted. Extremities:  No clubbing, cyanosis, edema.Note : seen by Podiatry every 3 months) Neurologic:  alert & oriented X3 and sensation intact to light touch over feet.   Skin:  Intact without suspicious lesions or rashes Cervical Nodes:  No lymphadenopathy noted Axillary Nodes:  No palpable lymphadenopathy Psych:  memory intact for recent and remote, normally interactive, and good eye contact.     Detailed Cardiovascular Exam  Neck    Carotids: Carotids full and equal bilaterally without bruits.      Neck Veins: Normal, no JVD.    Heart    Inspection: no deformities or lifts noted.      Palpation: normal PMI with no thrills palpable.       Auscultation: regular rate and rhythm, S1, S2 without murmurs, rubs, gallops, or clicks.    Vascular    Abdominal Aorta: no palpable masses, pulsations, or audible bruits.      Femoral Pulses: normal femoral pulses bilaterally, Ruiz    Pedal Pulses: normal pedal pulses bilaterally.      Radial Pulses: normal radial pulses bilaterally.      Peripheral Circulation: no clubbing, cyanosis, or edema noted with normal capillary refill.     EKG  Procedure date:  09/19/2010  Findings:      Sinus rhythm, rate 78, axis within normal limits, intervals within normal limits, no acute ST-T wave changes  Impression & Recommendations:  Problem # 1:  CAD (ICD-414.00) The patient has no ongoing angina. She will continue with risk reduction. Orders: EKG w/ Interpretation (93000)  Problem # 2:  CAROTID BRUITS, BILATERAL (ICD-785.9) She has no nonobstructive carotid stenosis and is due for carotid Dopplers in April. I will arrange this. Orders: Carotid Duplex (Carotid Duplex)  Problem # 3:  NONDEPENDENT TOBACCO USE DISORDER (ICD-305.1) We discussed the need to stop smoking (greater than 3 minutes). She will try cold [place].  Problem # 4:  HYPERTENSION (ICD-401.9) Her blood pressure is elevated but she did not take her medications today. She says it is otherwise well controlled. I will make no changes.  Problem # 5:  DIABETES MELLITUS, TYPE II (ICD-250.00) Her hemoglobin A1c was 6.7 and she will continue the meds as listed.  Patient Instructions: 1)  Your physician recommends that you schedule a follow-up appointment in: 6 months with Dr  Lions 2)  Your physician recommends that you continue on your current medications as directed. Please refer to the Current Medication list given to you today. 3)  Your physician has requested that you have a carotid duplex in April 2012. This test is an ultrasound of the carotid arteries in your neck. It looks at blood flow through these arteries that supply  the brain with blood. Allow one hour for this exam. There are no restrictions or special instructions. 4)  Your physician discussed the hazards of tobacco use.  Tobacco use cessation is recommended and techniques and options to help you quit were discussed.

## 2010-12-19 ENCOUNTER — Encounter: Payer: Self-pay | Admitting: *Deleted

## 2011-01-23 ENCOUNTER — Other Ambulatory Visit: Payer: Self-pay | Admitting: Cardiology

## 2011-01-23 ENCOUNTER — Other Ambulatory Visit: Payer: Self-pay | Admitting: *Deleted

## 2011-01-23 ENCOUNTER — Encounter (INDEPENDENT_AMBULATORY_CARE_PROVIDER_SITE_OTHER): Payer: PRIVATE HEALTH INSURANCE | Admitting: *Deleted

## 2011-01-23 DIAGNOSIS — R0989 Other specified symptoms and signs involving the circulatory and respiratory systems: Secondary | ICD-10-CM

## 2011-01-23 DIAGNOSIS — I6529 Occlusion and stenosis of unspecified carotid artery: Secondary | ICD-10-CM

## 2011-01-23 NOTE — Cardiovascular Report (Signed)
NAME:  JACARRA, BOBAK NO.:  [id number]   MEDICAL RECORD NO.:  [id number]          PATIENT TYPE:  INP   LOCATION:  2899                         FACILITY:  [facility]   PHYSICIAN:  Nanetta Batty, M.D.   DATE OF BIRTH:  Mar 08, 1951   DATE OF PROCEDURE:  DATE OF DISCHARGE:                            CARDIAC CATHETERIZATION   HISTORY OF PRESENT ILLNESS:  Ms. Holben is a 60 year old, mildly  overweight African American female, mother of four children with a  history of CAD and PVOD.  She has had right renal artery intervention as  well as reintervention in the past.  She has also had right iliac PTA  and stenting with known total left SFA.  Positive for hypertension and  hyperlipidemia as well as non-insulin requiring diabetes.  Follow-up  renal Dopplers performed April 28 revealed early in-stent restenosis  after her most recent procedure performed August 20, 2006.  She does  have hypertension and multiple antihypertensive medications.  She  presents now for angiography and potential re-intervention.   DESCRIPTION OF PROCEDURE:  The patient was brought to the second floor  [facility] angiographic suite in the postabsorptive state.  She was  premedicated with p.o. Valium.  Right groin was prepped and shaved in  the usual sterile fashion and 1% Xylocaine was used for local  anesthesia.  A six upgraded to 7-French sheath was inserted into the  right femoral artery using standard Seldinger technique.  A 5-French  pigtail catheter was used for midstream abdominal aortography.  A 7-  French right Judkins guide was used for selective right renal artery  angiography.  Visipaque dye was used through the entirety of the case.  Aortic pressures monitored in the case.   ANGIOGRAPHIC RESULTS:  1. Abdominal aorta.      a.     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      b.     A 40-50% left renal artery stenosis.  2. The right and iliac stent was widely patent.   PROCEDURE DESCRIPTION:  The patient received 3000 units of heparin  intravenously.  Using a 7-French short IMA guide catheter along with OM4  190 stabilizer wire and a 35 x 15 cutting balloon, atherectomy was  performed on the right renal artery in-stent restenosis at nominal  pressures.  Following this a 35 x 18 Endeavor stent was then deployed  across the entirety of the stented segment at 15 atmospheres and  postdilated with a 50 x 15 Quantum at 12 atmospheres (5 mm) resulting in  reduction of a 95% diffuse in-stent restenosis to 0% residual with  excellent flow.  The patient tolerated procedure well.  The guidewire  and catheter were removed.  The ACT was measured less than  200.  The sheath was removed.  Pressure held on groin to achieve  hemostasis.  The patient left lab in stable condition.  She will be  hydrated overnight, discharged home in the morning on aspirin and  Plavix.  She will get follow-up renal Dopplers and we will see her back  in the office after that.  She  left lab in stable condition.      Nanetta Batty, M.D.  Electronically Signed     JB/MEDQ  D:  04/28/2007  T:  04/28/2007  Job:  [id number]   cc:   2nd Floor [facility] Angiographic Suite  [facility]  Madaline Savage, M.D.  Titus Dubin. Alwyn Ren, MD,FACP,FCCP

## 2011-01-24 ENCOUNTER — Encounter: Payer: Self-pay | Admitting: Cardiology

## 2011-01-26 NOTE — Cardiovascular Report (Signed)
NAME:  Christine Meyer, Christine Meyer NO.:  [id number]   MEDICAL RECORD NO.:  [id number]          PATIENT TYPE:  AMB   LOCATION:  SDS                          FACILITY:  [facility]   PHYSICIAN:  Nanetta Batty, M.D.   DATE OF BIRTH:  12/30/1950   DATE OF PROCEDURE:  08/20/2006  DATE OF DISCHARGE:                            CARDIAC CATHETERIZATION   PROCEDURES:  1. Abdominal aortogram.  2. Selective right renal angiogram.  3. Cutting balloon atherectomy.  4. Percutaneous transluminal angioplasty and stent procedure.   Christine Meyer is a 60 year old moderately overweight African-American  female, history of CAD status post RCA stenting by Dr. Elsie Lincoln in 1999.  Problems include peripheral vascular occlusive disease status post right  renal artery PTA and stenting 6 months ago as well as right iliac artery  PTA and stenting.  She is a known superficial femoral artery occlusion.  We have been following her carotids which show moderate disease on the  left.  She does complain of left lower extremity claudication.  She  denies chest pain.  Left Myoview performed in April was nonischemic.  She recently discontinued smoking.  She has had resistant hypertension  and was seen by Dr. Elsie Lincoln July 23, 2006, with documented blood  pressures in the 170 range.  Recent renal Dopplers revealed high-grade  in-stent restenosis.  She presents now for angiography and potential  repeat intervention.   DESCRIPTION OF PROCEDURE:  The patient brought to the second floor [facility] angiographic suite in the postabsorptive state.  She was  premedicated with p.o. Valium.  Her right groin was prepped and shaved  in the usual sterile fashion; 1% Xylocaine was used local anesthesia.  A  6-French sheath was inserted into the right femoral artery using  standard Seldinger technique.  A 5-French tennis racket catheter was  used for abdominal aortography.  ICV dye was used for the entirety of  the case.  Aortic  pressures monitored in the case.   ANGIOGRAPHIC RESULTS:  Abdominal aorta.  1. Renal arteries: 95% in-stent restenosis within the right renal      artery stent.  2. Left renal artery patent.  3. Infrarenal abdominal aorta normal.  4. Iliac bifurcation 40% ostial right common iliac artery stenosis.      The right iliac stent was widely patent.   DESCRIPTION OF PROCEDURE:  Existing 6-French sheath in the right femoral  artery was exchanged over wire for a 7-French sheath.  Using a 7-French  IMA short guide catheter along with 0.14 190 stabilizer wire on a 4 x 15  cutting balloon, atherectomy was performed.  The patient received 3000  units of heparin intravenously.  Following this, angioplasty was  performed with a 6 x 15 Aviator resulting in residual disease at the  distal edge of the stent.  Because of this, additional stenting was  performed with a 6 x 12 Genesis on Aviator stent balloon premount and  deployed at 8-10 atmospheres.  The final angiographic result was  reduction of 95% in-stent restenosis to 0% residual.  The patient  tolerated the procedure well.  The patient did  have transient back pain  with balloon and stent deployment which resolved immediately with  balloon deflation.  The guidewire and catheter removed.  The sheath  was then secured in place.  ACT was measured.  The sheath will be  removed once the ACT falls below 150.  The patient will be hydrated  overnight and discharged home in the morning on aspirin and Plavix.  She  will get followup renal Dopplers in the office after which time she will  see me back in the office for followup.      Nanetta Batty, M.D.  Electronically Signed     JB/MEDQ  D:  08/20/2006  T:  08/20/2006  Job:  [id number]   cc:   2nd Fl[facility] angiographic suite  [facility]  Madaline Savage, M.D.  Titus Dubin. Alwyn Ren, MD,FACP,FCCP

## 2011-01-26 NOTE — Discharge Summary (Signed)
NAMEDENORA, WYSOCKI NO.:  [id number]   MEDICAL RECORD NO.:  [id number]          PATIENT TYPE:  INP   LOCATION:  2039                         FACILITY:  [facility]   PHYSICIAN:  Madaline Savage, M.D.DATE OF BIRTH:  10-30-1950   DATE OF ADMISSION:  02/12/2006  DATE OF DISCHARGE:  02/13/2006                                 DISCHARGE SUMMARY   DISCHARGE DIAGNOSIS:  1.  Claudication with abnormal ABIs, status post elective right external      iliac artery percutaneous transluminal angioplasty as well as right      renal artery stenting this admission.  2.  Coronary disease, right coronary artery stenting in 1999, by Dr. Elsie Lincoln.  3.  Hypertension, treated, now status post right renal artery stenosis.  4.  Hyperlipidemia, treated.  5.  Non-insulin-dependent diabetes mellitus.  6.  Smoking.   HOSPITAL COURSE:  The patient is a 60 year old female, followed by Dr.  Elsie Lincoln, with disease as noted above.  She presented Dr. Allyson Sabal, Jan 11, 2006,  with claudication and accelerated hypertension.  Outpatient Doppler  suggested right renal artery stenosis and showed moderate disease in both  lower extremities.  She was admitted for peripheral angiogram.  This was  done on February 12, 2006.  An 80% right renal artery stenosis, 70% right  external iliac artery stenosis, and total left SFA with three-vessel runoff.  She underwent a right external iliac artery stenosis intervention and right  renal artery stenosis intervention.  She tolerated this well and we feel she  be discharged February 13, 2006.   DISCHARGE MEDICATIONS:  1.  Altace 10 mg a day.  2.  Toprol XL 50 mg a day.  3.  Advicor 500/20 every day.  4.  Plavix 75 mg a day.  5.  Actos 30 mg a day.  6.  Glimepiride 1 mg a day.  7.  Chantix 1 mg twice a day.   LABORATORY:  White count 4.3, hemoglobin 11.3, hematocrit 33, platelets 206.  Sodium 143, potassium 4.2, BUN 16, creatinine 1.1.   DISPOSITION:  The patient is discharged  in stable condition and will follow  up with Dr. Elsie Lincoln in the office.      Abelino Derrick, P.A.    ______________________________  Madaline Savage, M.D.    Lenard Lance  D:  02/13/2006  T:  02/13/2006  Job:  [id number]   cc:   Titus Dubin. Alwyn Ren, M.D. [facility]  [phone] [place]  [place] [address]

## 2011-01-26 NOTE — Discharge Summary (Signed)
Christine Meyer, Christine Meyer NO.:  [id number]   MEDICAL RECORD NO.:  [id number]          PATIENT TYPE:  INP   LOCATION:  6531                         FACILITY:  [facility]   PHYSICIAN:  Nanetta Batty, M.D.        DATE OF BIRTH:   DATE OF ADMISSION:  04/28/2007  DATE OF DISCHARGE:  04/29/2007                               DISCHARGE SUMMARY   MAIN CARDIOLOGIST:  Dr. Elsie Lincoln.   PRIMARY CARE PHYSICIAN:  Dr. Alwyn Ren in [place].   FINAL DIAGNOSES:  1. Peripheral vascular disease.  2. Right renal artery end stent restenosis requiring cutting balloon      nephrectomy and placement of an Endeavor stent.  3. Left renal artery 40% to 50% stenosis.  4. Hypertension.  5. Hyperlipidemia.  6. Coronary artery disease.   PROCEDURE:  Renal angiogram reveals 95% stenosis in the right renal  artery.  This was end stent restenosis.  There was also noted to be a  40% to 50% stenosis of the left renal artery.  The right and ileac stent  was widely patent.  Dr. Allyson Sabal proceeded with a cutting balloon  nephrectomy of the right renal artery stent restenosis, followed by  placement of 35-mm  x 18-mm Endeavor stent which was deployed across the  entirety of the previously-stented segment, taking 95% stenosis to 0%  residual.   COMPLICATIONS:  None.   BRIEF HISTORY:  Christine Meyer is a 60 year old African American female with  a history of coronary atherosclerotic heart disease, as well as  peripheral vascular disease.  She underwent renal artery stenting in the  past, as well as right ileac PTA stenting, and she has no totally-  occluded left SFA.  She also has diabetes mellitus, as well as  hypertension and dyslipidemia.  She underwent followup renal Dopplers in  April of 2008, which revealed early end-stent restenosis, and she was  referred back for repeat angiography as well as potential intervention.   HOSPITAL COURSE:  Postprocedure, Christine Meyer was admitted to 6500 in  stable condition.  She  was hydrated aggressively post angiogram.  Preprocedure labs revealed a sodium of 140, BUN of 20, creatinine of  1.9.  Postprocedure labs remains stable.  The groin site was without  hematoma or swelling.  Her BUN prior to discharge was 20, and her  creatinine was 1.35.  She does continue to smoke, and we have strongly  advised her to stop tobacco.  She is deemed ready for discharge.  She  may increase her activity slowly.  She may shower and bathe.  No lifting  for the next 2 weeks.  No driving for the next 48 hours.  She is to  follow a low-sodium heart-healthy diet.  She may return to work on  May 06, 2007.  Please keep her groin site clean and dry, and is to  contact us with any redness, swelling, or drainage from the site.  She  is to follow up with Dr. Allyson Sabal in the next 1 to 2 weeks.  However, prior  to that appointment I would like for her to have renal Dopplers  performed.  Our office has been notified and will contact her with date  and time of the appointment.   DISCHARGE MEDICATIONS:  1. Altace 10 mg 2 pills daily.  2. Toprol-XL 50 mg daily.  3. Advicor 500 mg/20 mg daily.  4. Plavix 75 mg daily.  5. Actos 30 mg daily.  6. Glimepiride 2 mg daily.  7. Multivitamin daily.  8. Caltrate Plus D 600 mg daily.  9. Aspirin 81 mg daily.     ______________________________  Charmian Muff, NP      Nanetta Batty, M.D.  Electronically Signed    LS/MEDQ  D:  06/03/2007  T:  06/04/2007  Job:  [id number]   cc:   [facility]  Titus Dubin. Alwyn Ren, MD,FACP,FCCP

## 2011-01-26 NOTE — Cardiovascular Report (Signed)
NAME:  BREXLEE, HEBERLEIN NO.:  [id number]   MEDICAL RECORD NO.:  [id number]          PATIENT TYPE:  INP   LOCATION:  2039                         FACILITY:  [facility]   PHYSICIAN:  Nanetta Batty, M.D.   DATE OF BIRTH:  [birth year]   DATE OF PROCEDURE:  DATE OF DISCHARGE:                              CARDIAC CATHETERIZATION   Ms. Edling is a 60 year old, mildly overweight, African American female with  a history of CAD status post RCA stenting in 1999 by Dr. Lavonne Chick.  Other  problems include recently discontinued tobacco abuse, hypertension,  hyperlipidemia, and diabetes.  She had a Persantine Myoview performed, December 28, 2005, which was not ischemic.  She denies chest pain, shortness of  breath.  She does complain of significant claudication, right greater than  left at one block.  Recent Doppler's revealed high-grade right renal artery  stenosis, moderate left ICA stenosis, with severe PVOD.  ABIs were 0.7 on  the right and 0.55 on the left with what appeared to be an occluded left  SFA.  She presents now for angiography and potential intervention.   DESCRIPTION OF PROCEDURE:  The patient was brought to the second floor [facility] angiographic suite in the postabsorptive state.  She was premedicated  without p.o. Valium. Her right groin was prepped and shaved in the usual  sterile fashion.  Her left groin was prepped and draped in the usual sterile  fashion.  Xylocaine 1% was used for local anesthesia.  A 5-French sheath was  inserted into left femoral artery, using standard Seldinger technique.  A 5-  [place] tennis racket catheter was used for midstream and distal abdominal  aortography with bi femoral runoff.  Visipaque dye was used for the entirety  of the case.  Retrograde aortic pressures monitored during the case.   ANGIOGRAPHIC RESULTS:  1.  Abdominal aorta.      1.  Renal arteries - 80% right renal artery stenosis.      2.  Infrarenal abdominal aorta free  of significant atherosclerotic          changes.  2.  Left lower extremity.      1.  Total left SFA with reconstitution of the above-the-knee popliteal          by profunda femoris collaterals.  There is 2-vessel runoff.  3.  Right lower extremity.      1.  A 40 to 50% ostial right common iliac artery stenosis.      2.  A 70% smooth segmental proximal right external iliac artery stenosis          with an 80-mm pullback gradient, after administration of 200 mcg of          intra-arterial nitroglycerin distally through an end-hole catheter.      3.  Three-vessel runoff.   IMPRESSION:  Ms. Schum has high-grade right renal artery stenosis, total  left superficial femoral artery and high-grade hemodynamically significant  right external iliac artery stenosis, probably responsible for claudication.  We will proceed with stenting of her iliac and renal artery for relief of  symptoms of claudication, for renal preservation, and treatment of  renovascular hypertension.   PROCEDURE DESCRIPTION:  The patient received 3000 units of heparin  intravenously.  Contralateral access was obtained with a 5-French crossover  catheter and an 0.035 __________ wire.  A 6-French bright tip 30-cm  __________ Cordis sheath was then advanced over the iliac bifurcation and an  8.4 Smart stent was then deployed under fluoroscopic angiographic control  and post dilated with a 7 Powerflex to 2 atmospheres resulting in reduction  of a 70% proximal right external iliac artery stenosis to 0% residual.   Attention was then focused on the right renal artery.  Using a 6-French  short right Judkins guide catheter with an 0.014 190 stabilizer guidewire  and a 4 x 1.5 Genesis balloon PTA was performed.  Following this,  overlapping a 6 x 12 Genesis __________  stents were then deployed at  nominal pressures resulting in reduction of an 80% proximal right renal  artery stenosis to 0% residual.  The patient tolerated the  procedure well.  There were no hemodynamic or electrograph sequelae.   IMPRESSION:  Ms. Stehlin had successful PTA and stenting of her right external  iliac artery and right renal artery.   The guidewire and catheter were removed.  The sheath was then secured in  place.  An ACT will be measured and the sheaths will be removed once  __________  is below 200.  The patient will be hydrated overnight,  discharged home in the morning if she remains hemodynamically stable.  The  patient left lab in stable condition.      Nanetta Batty, M.D.  Electronically Signed     JB/MEDQ  D:  02/12/2006  T:  02/13/2006  Job:  [id number]   cc:   patient's chart   Second Floor [facility]   [facility]  [address]  [place], [place] [address]   Madaline Savage, M.D.  Fax: [phone]   Titus Dubin. Alwyn Ren, M.D. [facility]  [phone] [place]  [place] [address]

## 2011-01-26 NOTE — Discharge Summary (Signed)
NAME:  MAGDALENE, TARDIFF NO.:  [id number]   MEDICAL RECORD NO.:  [id number]          PATIENT TYPE:  OIB   LOCATION:  3705                         FACILITY:  [facility]   PHYSICIAN:  Nanetta Batty, M.D.   DATE OF BIRTH:  1951/06/26   DATE OF ADMISSION:  08/20/2006  DATE OF DISCHARGE:  08/21/2006                               DISCHARGE SUMMARY   DISCHARGE DIAGNOSIS:  1. Peripheral vascular disease, status post left renal artery      angioplasty this admission secondary to in-stent restenosis.  2. Treated hypertension.  3. Non-insulin-dependent diabetes.  4. Treated dyslipidemia.  5. Coronary disease with prior right coronary artery stenting.  6. History of smoking.   HOSPITAL COURSE:  Ms. Jillson is a 60 year old female followed by Dr.  Elsie Lincoln who had RCA stent placed in 1999.  She has hypertension and  peripheral vascular disease.  She has had previous right renal artery  PTA and stenting in the spring of 2007.  She also had a right iliac  artery PTA and has known left superficial femoral artery occlusion.  She  has moderate disease in her carotids that we are following.  She had a  nonischemic Cardiolite in April 2000.  She was referred to Dr. Allyson Sabal for  evaluation of hypertension with abnormal renal Dopplers.  She was seen  by Dr. Allyson Sabal August 08, 2006, admitted for elective angiogram.  This  revealed a 95% in-stent restenosis on the right.  This was dilated and  stented with good result.  She had a patent right iliac stent.  We feel  she can be discharged on August 21, 2006.  She will need followup  Dopplers and followup BMP.   LABORATORY DATA AT DISCHARGE:  White count 5.0, hemoglobin 10.6,  hematocrit 31.5, platelets 183.  Sodium 140, potassium 3.8, BUN 14,  creatinine 1.3   DISPOSITION:  The patient is discharged in stable condition and will  follow up with Dr. Elsie Lincoln and Dr. Allyson Sabal.      Abelino Derrick, P.A.      Nanetta Batty, M.D.  Electronically Signed    LKK/MEDQ  D:  08/21/2006  T:  08/21/2006  Job:  [id number]   cc:   Titus Dubin. Alwyn Ren, MD,FACP,FCCP  Madaline Savage, M.D.

## 2011-02-05 ENCOUNTER — Other Ambulatory Visit: Payer: Self-pay | Admitting: Internal Medicine

## 2011-02-06 NOTE — Telephone Encounter (Signed)
2)  Please schedule a follow-up appointment in 4 months. 3)  HbgA1C prior to visit, ICD-9:250.00 4)  Urine Microalbumin prior to visit, ICD-9:250.00 Copied from 08/2010 Patient Instructions

## 2011-02-14 ENCOUNTER — Encounter: Payer: Self-pay | Admitting: Cardiology

## 2011-03-12 ENCOUNTER — Encounter: Payer: Self-pay | Admitting: Cardiology

## 2011-03-12 ENCOUNTER — Ambulatory Visit (INDEPENDENT_AMBULATORY_CARE_PROVIDER_SITE_OTHER): Payer: PRIVATE HEALTH INSURANCE | Admitting: Cardiology

## 2011-03-12 DIAGNOSIS — F17200 Nicotine dependence, unspecified, uncomplicated: Secondary | ICD-10-CM

## 2011-03-12 DIAGNOSIS — E785 Hyperlipidemia, unspecified: Secondary | ICD-10-CM

## 2011-03-12 DIAGNOSIS — E119 Type 2 diabetes mellitus without complications: Secondary | ICD-10-CM

## 2011-03-12 DIAGNOSIS — N259 Disorder resulting from impaired renal tubular function, unspecified: Secondary | ICD-10-CM

## 2011-03-12 DIAGNOSIS — R0989 Other specified symptoms and signs involving the circulatory and respiratory systems: Secondary | ICD-10-CM

## 2011-03-12 DIAGNOSIS — I10 Essential (primary) hypertension: Secondary | ICD-10-CM

## 2011-03-12 DIAGNOSIS — I2510 Atherosclerotic heart disease of native coronary artery without angina pectoris: Secondary | ICD-10-CM

## 2011-03-12 MED ORDER — LISINOPRIL 20 MG PO TABS: 20.0000 mg | ORAL_TABLET | Freq: Every day | ORAL | Status: DC

## 2011-03-12 MED ORDER — PRAVASTATIN SODIUM 80 MG PO TABS: 80.0000 mg | ORAL_TABLET | Freq: Every evening | ORAL | Status: DC

## 2011-03-12 NOTE — Progress Notes (Signed)
HPI The patient presents for followup of her known significant vascular disease. Since I last saw her she had carotid Doppler demonstrating less then 30% right stenosis and 40-59% left stenosis. She has been unable to stop smoking despite Chantix. She has had no new cardiovascular symptoms.The patient denies any new symptoms such as chest discomfort, neck or arm discomfort. There has been no new shortness of breath, PND or orthopnea. There have been no reported palpitations, presyncope or syncope.  She walks at work but does not otherwise exercise.  No Known Allergies  Current Outpatient Prescriptions  Medication Sig Dispense Refill  . aspirin (BABY ASPIRIN) 81 MG chewable tablet Chew 81 mg by mouth daily.        . Calcium Carbonate (CALTRATE 600 PO) Take by mouth daily.        Marland Kitchen lisinopril (PRINIVIL,ZESTRIL) 20 MG tablet Take 20 mg by mouth daily.        . metFORMIN (GLUCOPHAGE) 500 MG tablet Take one tablet daily with largest meal  30 tablet  0  . metoprolol tartrate (LOPRESSOR) 25 MG tablet Take 25 mg by mouth 2 (two) times daily.        . Multiple Vitamins-Minerals (MULTIVITAL) tablet Take 1 tablet by mouth daily.        . niacin 500 MG CR capsule Take 500 mg by mouth at bedtime.        . ranitidine (ZANTAC) 150 MG capsule Take 150 mg by mouth. 1 every 12 hrs pre meals as needed for heartburn       . DISCONTD: simvastatin (ZOCOR) 80 MG tablet Take 80 mg by mouth at bedtime.          Past Medical History  Diagnosis Date  . Anemia     NOS  . Diabetes mellitus, type 2   . Hypertension   . Uterine cancer     Past Surgical History  Procedure Date  . Hysterectomy (other)     and BSO for uterine CA  . Right renal art stent May 2007  . Right renal art sten for restenosis Dec 2007  . Rca stent 1999    Dr. Elsie Lincoln G4 P4  . Colonoscopy neg 2000    ROS:  As stated in the HPI and negative for all other systems.  PHYSICAL EXAM BP 152/74  Pulse 80  Resp 16  Ht 5\' 2"  (1.575 m)  Wt 178  lb (80.74 kg)  BMI 32.56 kg/m2GENERAL:  Well appearing HEENT:  Pupils equal round and reactive, fundi not visualized, oral mucosa unremarkable NECK:  No jugular venous distention, waveform within normal limits, carotid upstroke brisk and symmetric, soft carotid bruits, no thyromegaly LYMPHATICS:  No cervical, inguinal adenopathy LUNGS:  Clear to auscultation bilaterally BACK:  No CVA tenderness CHEST:  Unremarkable HEART:  PMI not displaced or sustained,S1 and S2 within normal limits, no S3, no S4, no clicks, no rubs, no murmurs ABD:  Flat, positive bowel sounds normal in frequency in pitch, no bruits, no rebound, no guarding, no midline pulsatile mass, no hepatomegaly, no splenomegaly EXT:  2 plus pulses upper, diminished DP/PT lower., no edema, no cyanosis no clubbing SKIN:  No rashes no nodules NEURO:  Cranial nerves II through XII grossly intact, motor grossly intact throughout PSYCH:  Cognitively intact, oriented to person place and time   EKG:  Sinus rhythm, rate 71, axis within normal limits, intervals within normal limits, no acute ST-T wave changes.  ASSESSMENT AND PLAN

## 2011-03-12 NOTE — Assessment & Plan Note (Signed)
Her blood pressure is elevated today but she has been out of her lisinopril. She will restart this and I will assess at the time of her treadmill.

## 2011-03-12 NOTE — Patient Instructions (Addendum)
You are being scheduled for a treadmill test.  Please follow the instruction sheet given.  You are due for this in 8 weeks. Please have a fasting lipid panel in 8 weeks.  Do not eat or drink anything after midnight the day you come in. Start Pravastatin 80 mg a day Stop Simvastatin Continue all other medications as listed

## 2011-03-12 NOTE — Assessment & Plan Note (Signed)
She will have follow up in April of next year.

## 2011-03-12 NOTE — Assessment & Plan Note (Signed)
I will bring the patient back for a POET (Plain Old Exercise Test). This will allow me to screen for obstructive coronary disease, risk stratify and very importantly provide a prescription for exercise.   

## 2011-03-12 NOTE — Assessment & Plan Note (Signed)
We again discussed the need to stop smoking 

## 2011-03-12 NOTE — Assessment & Plan Note (Signed)
I would check a lipid profile when she comes back for a treadmill. She is not taking simvastatin apparently because of cost. I have convinced her to start pravastatin 80 mg q.h.s. Her lipids will be in 8 weeks.

## 2011-04-12 ENCOUNTER — Other Ambulatory Visit: Payer: Self-pay | Admitting: *Deleted

## 2011-04-12 ENCOUNTER — Telehealth: Payer: Self-pay | Admitting: Internal Medicine

## 2011-04-12 MED ORDER — METFORMIN HCL ER 500 MG PO TB24: ORAL_TABLET | ORAL | Status: DC

## 2011-04-12 MED ORDER — METOPROLOL TARTRATE 25 MG PO TABS: 25.0000 mg | ORAL_TABLET | Freq: Two times a day (BID) | ORAL | Status: DC

## 2011-04-12 NOTE — Telephone Encounter (Signed)
Per OV in Dec-- needs labs prior to visit, due for f/u w/ Hopp as well.  3)  HbgA1C prior to visit, ICD-9:250.00 4)  Urine Microalbumin prior to visit, ICD-9:250.00

## 2011-04-12 NOTE — Telephone Encounter (Signed)
Patient has lab appt on 04/16/2011 and followup appt with Dr Alwyn Ren on 8/15

## 2011-04-16 ENCOUNTER — Other Ambulatory Visit (INDEPENDENT_AMBULATORY_CARE_PROVIDER_SITE_OTHER): Payer: PRIVATE HEALTH INSURANCE

## 2011-04-16 DIAGNOSIS — E119 Type 2 diabetes mellitus without complications: Secondary | ICD-10-CM

## 2011-04-16 LAB — MICROALBUMIN / CREATININE URINE RATIO: Microalb, Ur: 0.2 mg/dL (ref 0.0–1.9)

## 2011-04-17 LAB — HEMOGLOBIN A1C: Hgb A1c MFr Bld: 6.3 % (ref 4.6–6.5)

## 2011-04-25 ENCOUNTER — Encounter: Payer: Self-pay | Admitting: Internal Medicine

## 2011-04-25 ENCOUNTER — Ambulatory Visit (INDEPENDENT_AMBULATORY_CARE_PROVIDER_SITE_OTHER): Payer: PRIVATE HEALTH INSURANCE | Admitting: Internal Medicine

## 2011-04-25 DIAGNOSIS — F17200 Nicotine dependence, unspecified, uncomplicated: Secondary | ICD-10-CM

## 2011-04-25 DIAGNOSIS — E119 Type 2 diabetes mellitus without complications: Secondary | ICD-10-CM

## 2011-04-25 NOTE — Progress Notes (Signed)
Subjective:    Patient ID: Christine Meyer, female    DOB: 03-11-1951, 60 y.o.   MRN: [id number]  HPI Diabetes status assessment: Fasting or morning glucose range:  84- 95  Highest glucose 2 hours after any meal:  Not  checked. Hypoglycemia :  no .                                                     Excess thirst :no;  Excess hunger:  no ;  Excess urination:  no.                                  Lightheadedness with standing:  no. Chest pain:  no ; Palpitations :no ;  Pain in  calves with walking:  Not since stents placed .                                                                                                                     Non healing skin  ulcers or sores,especially over the feet:  no. Numbness or tingling or burning in feet : no .                                                                                                                                              Significant change in  Weight : stable. Vision changes : no  .                                                                    Exercise : walking 2x/week . Nutrition/diet:  No plan  Medication compliance : yes. Medication adverse  Effects:  no . Eye exam : due Foot care : seen every 3 mos.  A1c/ urine microalbumin monitor:  6.3% ;0.2 Smoking : 4 cigarettes/ day; risks & options discussed  She is scheduled to see a cardiologist on August 30; lipids will be done and a  stress test performed. Her last lipids were 02/16/2010; all values were at goal at that time.        Review of Systems     Objective:   Physical Exam Gen.: Healthy and well-nourished in appearance. Alert, appropriate and cooperative throughout exam. Neck: No deformities, masses, or tenderness noted.  Thyroid normal. Lungs: Normal respiratory effort; chest expands symmetrically. Lungs are clear to auscultation without rales, wheezes, or increased work of breathing. Heart: Normal rate and rhythm. Normal S1 and S2. No gallop, click, or  rub. S4, no  murmur.                       Musculoskeletal/extremities: No  cyanosis, edema, or deformity noted. Slight clubbing suggested.Joints normal. Nail health  good. Vascular: Carotid, radial artery are full and equal. No bruits present. Slightly decreased pedal pulses Neurologic: Alert and oriented x3. Deep tendon reflexes symmetrical and normal.   Light touch normal over feet        Skin: Intact without suspicious lesions or rashes.  Psych: Mood and affect are normal. Normally interactive                                                                                         Assessment & Plan:  #1 diabetes, excellent control  #2 smoker, risk discussed  #3 dyslipidemia, as per cardiology  Plan: See orders and recommendations

## 2011-04-25 NOTE — Patient Instructions (Addendum)
Please think about quitting smoking. Review the risks we discussed. Please call 1-800-QUIT-NOW [phone]) for free smoking cessation counseling.Consider  [facility]'s smoking cessation program @ [email or web address] or [phone].  Please  schedule fasting Labs : BMET, A1c, TSH  in 6 months (250.00, 272.4)

## 2011-05-07 ENCOUNTER — Other Ambulatory Visit: Payer: Self-pay | Admitting: Internal Medicine

## 2011-05-08 MED ORDER — METOPROLOL TARTRATE 25 MG PO TABS: 25.0000 mg | ORAL_TABLET | Freq: Two times a day (BID) | ORAL | Status: DC

## 2011-05-08 MED ORDER — METFORMIN HCL ER 500 MG PO TB24: ORAL_TABLET | ORAL | Status: DC

## 2011-05-08 NOTE — Telephone Encounter (Signed)
RX's sent to pharmacy 

## 2011-05-10 ENCOUNTER — Other Ambulatory Visit (INDEPENDENT_AMBULATORY_CARE_PROVIDER_SITE_OTHER): Payer: PRIVATE HEALTH INSURANCE | Admitting: *Deleted

## 2011-05-10 ENCOUNTER — Encounter: Payer: PRIVATE HEALTH INSURANCE | Admitting: Cardiology

## 2011-05-10 ENCOUNTER — Ambulatory Visit (INDEPENDENT_AMBULATORY_CARE_PROVIDER_SITE_OTHER): Payer: PRIVATE HEALTH INSURANCE | Admitting: Cardiology

## 2011-05-10 DIAGNOSIS — E785 Hyperlipidemia, unspecified: Secondary | ICD-10-CM

## 2011-05-10 DIAGNOSIS — I2510 Atherosclerotic heart disease of native coronary artery without angina pectoris: Secondary | ICD-10-CM

## 2011-05-10 DIAGNOSIS — I10 Essential (primary) hypertension: Secondary | ICD-10-CM

## 2011-05-10 LAB — LIPID PANEL
LDL Cholesterol: 66 mg/dL (ref 0–99)
VLDL: 13.2 mg/dL (ref 0.0–40.0)

## 2011-05-10 NOTE — Patient Instructions (Signed)
Your physician has requested that you have a lexiscan myoview. For further information please visit [email or web address]. Please follow instruction sheet, as given.   

## 2011-05-10 NOTE — Progress Notes (Signed)
Exercise Treadmill Test  Pre-Exercise Testing Evaluation Rhythm: normal sinus  Rate: 66   PR:  .14 QRS:  .08  QT:  .39 QTc: .41     Test  Exercise Tolerance Test Ordering MD: Angelina Sheriff, MD  Interpreting MD:  Angelina Sheriff, MD  Unique Test No: 1  Treadmill:  1  Indication for ETT: known ASHD  Contraindication to ETT: No   Stress Modality: exercise - treadmill  Cardiac Imaging Performed: non   Protocol: standard Bruce - maximal  Max BP:  143/53  Max MPHR (bpm):  158 85% MPR (bpm):  134  MPHR obtained (bpm):  137 % MPHR obtained:  88  Reached 85% MPHR (min:sec):  3:50 Total Exercise Time (min-sec):  5:00  Workload in METS:  6.9 Borg Scale: 17  Reason ETT Terminated:  desired heart rate attained    ST Segment Analysis At Rest: normal ST segments - no evidence of significant ST depression With Exercise:  Borderline ST depression in the anterior and lateral leads at peak exercise  Other Information Arrhythmia:  Yes Angina during ETT:  absent (0) Quality of ETT:  diagnostic  ETT Interpretation:  borderline (indeterminate) with non-specific ST changes  Comments: The patient had an poor exercise tolerance.  There was no chest pain.  There was an accelerated level of dyspnea.  There were no arrhythmias, a normal heart rate response and normal BP response.  There was 1.5 mm ST T wave changes at peak exercise in the anterior and lateral leads.  She did not have a normal heart rate recovery.  Recommendations: The patient had ST changes suggestive but not diagnostic of ischemia.  She needs a Scientist, physiological.

## 2011-05-15 ENCOUNTER — Encounter: Payer: Self-pay | Admitting: *Deleted

## 2011-05-17 ENCOUNTER — Ambulatory Visit (HOSPITAL_COMMUNITY): Payer: No Typology Code available for payment source | Attending: Cardiology | Admitting: Radiology

## 2011-05-17 DIAGNOSIS — I739 Peripheral vascular disease, unspecified: Secondary | ICD-10-CM | POA: Insufficient documentation

## 2011-05-17 DIAGNOSIS — R0609 Other forms of dyspnea: Secondary | ICD-10-CM | POA: Insufficient documentation

## 2011-05-17 DIAGNOSIS — R9439 Abnormal result of other cardiovascular function study: Secondary | ICD-10-CM | POA: Insufficient documentation

## 2011-05-17 DIAGNOSIS — E119 Type 2 diabetes mellitus without complications: Secondary | ICD-10-CM

## 2011-05-17 DIAGNOSIS — I2510 Atherosclerotic heart disease of native coronary artery without angina pectoris: Secondary | ICD-10-CM

## 2011-05-17 DIAGNOSIS — R0989 Other specified symptoms and signs involving the circulatory and respiratory systems: Secondary | ICD-10-CM | POA: Insufficient documentation

## 2011-05-17 DIAGNOSIS — I10 Essential (primary) hypertension: Secondary | ICD-10-CM | POA: Insufficient documentation

## 2011-05-17 DIAGNOSIS — F17200 Nicotine dependence, unspecified, uncomplicated: Secondary | ICD-10-CM | POA: Insufficient documentation

## 2011-05-17 MED ORDER — TECHNETIUM TC 99M TETROFOSMIN IV KIT
32.0000 | PACK | Freq: Once | INTRAVENOUS | Status: AC | PRN
  Administered 2011-05-17: 32 via INTRAVENOUS

## 2011-05-17 MED ORDER — TECHNETIUM TC 99M TETROFOSMIN IV KIT
11.0000 | PACK | Freq: Once | INTRAVENOUS | Status: AC | PRN
  Administered 2011-05-17: 11 via INTRAVENOUS

## 2011-05-17 MED ORDER — REGADENOSON 0.4 MG/5ML IV SOLN
0.4000 mg | Freq: Once | INTRAVENOUS | Status: AC
  Administered 2011-05-17: 0.4 mg via INTRAVENOUS

## 2011-05-17 NOTE — Progress Notes (Signed)
[facility] SITE 3 NUCLEAR MED [address] [place] [address] [phone]  Cardiology Nuclear Med Study  Christine Meyer is a 60 y.o. female [id number] [birth year]   Nuclear Med Background Indication for Stress Test:  Evaluation for Ischemia, Stent Patency, PTCA Patency and  05/10/11 Abnormal GXT with poor exercise tolerance History: 2007  Angioplasty, COPD, 8/12 GXT- Abnormal, 2007 Heart Catheterization ,2007 Myocardial Perfusion Study and 1999, 2007 Stents-RCA Cardiac Risk Factors: Claudication, Hypertension, Lipids, NIDDM, PVD and Smoker  Symptoms:  DOE   Nuclear Pre-Procedure Caffeine/Decaff Intake:  None NPO After: 8:30pm   Lungs:  Clear IV 0.9% NS with Angio Cath:  22g  IV Site: R Wrist , tolerated well IV Started by:  Irean Hong, RN  Chest Size (in):  38 Cup Size: C  Height: 5\' 2"  (1.575 m)  Weight:  173 lb (78.472 kg)  BMI:  Body mass index is 31.64 kg/(m^2). Tech Comments:  Held lopressor, and metformin this am    Nuclear Med Study 1 or 2 day study: 1 day  Stress Test Type:  Treadmill/Lexiscan  Reading MD: Charlton Haws, MD  Order Authorizing Provider:  Dr. Daiva Nakayama  Resting Radionuclide: Technetium 26m Tetrofosmin  Resting Radionuclide Dose: 11 mCi   Stress Radionuclide:  Technetium 67m Tetrofosmin  Stress Radionuclide Dose: 32 mCi           Stress Protocol Rest HR: 59 Stress HR: 110  Rest BP: 110/42 Stress BP: 175/61  Exercise Time (min): n/a METS: n/a   Predicted Max HR: 158 bpm % Max HR: 69.62 bpm Rate Pressure Product: 52841   Dose of Adenosine (mg):  n/a Dose of Lexiscan: 0.4 mg  Dose of Atropine (mg): n/a Dose of Dobutamine: n/a mcg/kg/min (at max HR)  Stress Test Technologist: Bonnita Levan, RN  Nuclear Technologist:  Domenic Polite, CNMT     Rest Procedure:  Myocardial perfusion imaging was performed at rest 45 minutes following the intravenous administration of Technetium 19m Tetrofosmin. Rest ECG: NSR  Stress  Procedure:  The patient received IV Lexiscan 0.4 mg over 15-seconds with concurrent low level exercise and then Technetium 25m Tetrofosmin was injected at 30-seconds while the patient continued walking one more minute.  There were no significant changes with Lexiscan.  Quantitative spect images were obtained after a 45-minute delay. Stress ECG: No significant change from baseline ECG  QPS Raw Data Images:  Normal; no motion artifact; normal heart/lung ratio. Stress Images:  Normal homogeneous uptake in all areas of the myocardium. Rest Images:  Normal homogeneous uptake in all areas of the myocardium. Subtraction (SDS):  Normal Transient Ischemic Dilatation (Normal <1.22):  1.06 Lung/Heart Ratio (Normal <0.45):  .34  Quantitative Gated Spect Images QGS EDV:  71 ml QGS ESV:  29 ml QGS cine images:  NL LV Function; NL Wall Motion QGS EF: 59%  Impression Exercise Capacity:  Lexiscan with no exercise. BP Response:  Normal blood pressure response. Clinical Symptoms:  No chest pain. ECG Impression:  No significant ST segment change suggestive of ischemia. Comparison with Prior Nuclear Study: No images to compare  Overall Impression:  Normal stress nuclear study.       Charlton Haws

## 2011-05-24 ENCOUNTER — Telehealth: Payer: Self-pay | Admitting: Cardiology

## 2011-05-24 NOTE — Telephone Encounter (Signed)
Pt rtn call re test results °

## 2011-05-24 NOTE — Telephone Encounter (Signed)
Pt aware of normal myoview results

## 2011-06-22 LAB — BASIC METABOLIC PANEL
CO2: 26
Calcium: 8.8
Calcium: 9.1
Creatinine, Ser: 1.35 — ABNORMAL HIGH
GFR calc Af Amer: 51 — ABNORMAL LOW
GFR calc non Af Amer: 42 — ABNORMAL LOW
Glucose, Bld: 111 — ABNORMAL HIGH
Sodium: 140
Sodium: 141

## 2011-06-22 LAB — CBC
Hemoglobin: 11.2 — ABNORMAL LOW
MCHC: 33.8
MCV: 86.6
RDW: 15.4 — ABNORMAL HIGH

## 2011-09-20 ENCOUNTER — Encounter: Payer: Self-pay | Admitting: Internal Medicine

## 2011-09-20 ENCOUNTER — Ambulatory Visit (INDEPENDENT_AMBULATORY_CARE_PROVIDER_SITE_OTHER): Payer: PRIVATE HEALTH INSURANCE | Admitting: Internal Medicine

## 2011-09-20 VITALS — BP 130/72 | HR 73 | Temp 98.4°F | Wt 174.0 lb

## 2011-09-20 DIAGNOSIS — M545 Low back pain: Secondary | ICD-10-CM

## 2011-09-20 DIAGNOSIS — E119 Type 2 diabetes mellitus without complications: Secondary | ICD-10-CM

## 2011-09-20 DIAGNOSIS — R350 Frequency of micturition: Secondary | ICD-10-CM

## 2011-09-20 LAB — POCT URINALYSIS DIPSTICK
Protein, UA: NEGATIVE
Spec Grav, UA: <=1.005
Urobilinogen, UA: 0.2
pH, UA: 5

## 2011-09-20 LAB — HEMOGLOBIN A1C: Hgb A1c MFr Bld: 6.3 % (ref 4.6–6.5)

## 2011-09-20 MED ORDER — CYCLOBENZAPRINE HCL 5 MG PO TABS: 5.0000 mg | ORAL_TABLET | Freq: Three times a day (TID) | ORAL | Status: AC | PRN

## 2011-09-20 MED ORDER — TRAMADOL HCL 50 MG PO TABS: 50.0000 mg | ORAL_TABLET | Freq: Four times a day (QID) | ORAL | Status: AC | PRN

## 2011-09-20 NOTE — Progress Notes (Signed)
  Subjective:    Patient ID: Christine Meyer, female    DOB: 24-May-1951, 61 y.o.   MRN: [id number]  HPI FREQUENCY: Onset: 10 days  Course: progressive Symptoms Urgency: no  Dysuria: no Hesitancy: no  Hematuria: no  Flank Pain: yes, also LS with LLE radiation  Fever: no    Nausea/Vomiting: no  Discharge: yes, scant Red Flags  : (Risk Factors for Complicated UTI) Recent Antibiotic Usage (last 30 days): no  More than 3 UTI's last 12 months: no  PMH of  1. DM: yes 2. Renal Disease/Calculi: no  3. Urinary Tract Abnormality: no 4. Instrumentation/Trauma: no    Review of Systems   She is unable to quantitate or qualify the radicular pain in the left leg. She denies weakness or numbness or tingling in the leg. She denies any incontinence of urine or stool. Tylenol does help the back pain. She denies any back injury or back surgery.  She states fasting blood sugars typically are in the 125 range. A1c was well controlled at 6.3 in August, 20012     Objective:   Physical Exam Gen.: Healthy and well-nourished in appearance. Alert, appropriate and cooperative throughout exam.  Eyes: No corneal or conjunctival inflammation noted. No icterus Neck:  Range of motion normal. Lungs: Normal respiratory effort; chest expands symmetrically. Lungs are clear to auscultation without rales, wheezes, or increased work of breathing. Heart: Normal rate and rhythm. Normal S1 and S2. No gallop, click, or rub. Grade 1/6 systolic murmur  Abdomen: Bowel sounds normal; abdomen soft and nontender. No masses, organomegaly or hernias noted.                                                                                 Musculoskeletal/extremities: No deformity or scoliosis noted of  the thoracic or lumbar spine. No  cyanosis, edema, or deformity noted. Slight clubbing suggested. Tone & strength  Normal. She exhibits the classic "low back crawl" lying down and sitting up from the exam table. Straight leg raising  is normal. Vascular: Carotid, radial artery, dorsalis pedis and  posterior tibial pulses are full and equal. No bruits present. Neurologic: Alert and oriented x3. Deep tendon reflexes symmetrical and normal. Gait is normal including hips and heel walking.         Skin: Intact without suspicious lesions or rashes. Lymph: No cervical, axillary  lymphadenopathy present. Psych: Mood and affect are normal. Normally interactive                                                                                            Assessment & Plan:  #1 frequency; urinalysis is normal.  #2 diabetes, status needs to be clarified at home but sugar measurements are excellent.  #3 low back pain syndrome without evidence  Plan: See orders and recommendations

## 2011-09-20 NOTE — Progress Notes (Signed)
Addended by: Regis Bill on: 09/20/2011 01:54 PM   Modules accepted: Orders

## 2011-09-20 NOTE — Patient Instructions (Signed)
The best exercises for the low back include freestyle swimming, stretch aerobics, and yoga. 

## 2012-02-22 ENCOUNTER — Other Ambulatory Visit: Payer: Self-pay | Admitting: *Deleted

## 2012-02-22 ENCOUNTER — Encounter (INDEPENDENT_AMBULATORY_CARE_PROVIDER_SITE_OTHER): Payer: Self-pay

## 2012-02-22 DIAGNOSIS — I6529 Occlusion and stenosis of unspecified carotid artery: Secondary | ICD-10-CM

## 2012-03-07 ENCOUNTER — Telehealth: Payer: Self-pay | Admitting: *Deleted

## 2012-03-07 NOTE — Telephone Encounter (Signed)
Left message on identified voice mail--c. Dopplers normal--will need f/u c. Dopplers in 1 year

## 2012-04-01 ENCOUNTER — Telehealth: Payer: Self-pay | Admitting: Internal Medicine

## 2012-04-01 DIAGNOSIS — I10 Essential (primary) hypertension: Secondary | ICD-10-CM

## 2012-04-01 MED ORDER — LISINOPRIL 20 MG PO TABS: 20.0000 mg | ORAL_TABLET | Freq: Every day | ORAL | Status: DC

## 2012-04-01 NOTE — Telephone Encounter (Signed)
RX sent

## 2012-04-01 NOTE — Telephone Encounter (Signed)
Refill: Lisinopril oral tablet 20mg . Take one tablet daily. Qty 30. Last fill 11-06-11

## 2012-06-05 ENCOUNTER — Telehealth: Payer: Self-pay | Admitting: Internal Medicine

## 2012-06-05 NOTE — Telephone Encounter (Signed)
Refill #90; last labs were 1/13. Please  schedule fasting Labs & follow up appt : BMETa1c , urine microalbumin. Diagnoses /Codes: 250.00

## 2012-06-05 NOTE — Telephone Encounter (Signed)
Refill metformin HCL ER Oral tablet extend 500mg  #90 - take one tablet by mouth every day with largest meal--last fill 7.22.13  last ov 1.10.13 acute

## 2012-06-05 NOTE — Telephone Encounter (Signed)
Lmovm for pt to call office. °

## 2012-06-05 NOTE — Telephone Encounter (Signed)
Refill metformin HCL ER Oral tablet extend 500mg  #90 - take one tablet by mouth every day with largest meal--last fill 7.22.13  last ov 1.10.13 acute. Plz advise if refill approved since office visit over due.      MW

## 2012-06-06 NOTE — Telephone Encounter (Signed)
Pt scheduled  

## 2012-06-13 ENCOUNTER — Other Ambulatory Visit (INDEPENDENT_AMBULATORY_CARE_PROVIDER_SITE_OTHER): Payer: Self-pay

## 2012-06-13 DIAGNOSIS — E119 Type 2 diabetes mellitus without complications: Secondary | ICD-10-CM

## 2012-06-13 LAB — BASIC METABOLIC PANEL
BUN: 16 mg/dL (ref 6–23)
CO2: 25 mEq/L (ref 19–32)
Chloride: 106 mEq/L (ref 96–112)
GFR: 50.47 mL/min — ABNORMAL LOW (ref >60.00)
Glucose, Bld: 91 mg/dL (ref 70–99)
Potassium: 4 mEq/L (ref 3.5–5.1)

## 2012-06-13 LAB — MICROALBUMIN / CREATININE URINE RATIO
Creatinine,U: 226.7 mg/dL
Microalb Creat Ratio: 0.1 mg/g (ref 0.0–30.0)

## 2012-06-16 ENCOUNTER — Other Ambulatory Visit: Payer: Self-pay

## 2012-06-16 NOTE — Telephone Encounter (Signed)
Per Dr.Hopper patient to hold Metformin due to improvement in A1c  I called the pharmacy and left message on voicemail informing them of Dr.Hopper decision for they sent a manual fax stating 2nd request

## 2012-06-20 ENCOUNTER — Encounter: Payer: Self-pay | Admitting: Internal Medicine

## 2012-06-20 ENCOUNTER — Ambulatory Visit (INDEPENDENT_AMBULATORY_CARE_PROVIDER_SITE_OTHER): Payer: Self-pay | Admitting: Internal Medicine

## 2012-06-20 VITALS — BP 146/72 | HR 76 | Temp 98.1°F | Resp 18 | Ht 63.0 in | Wt 161.0 lb

## 2012-06-20 DIAGNOSIS — E119 Type 2 diabetes mellitus without complications: Secondary | ICD-10-CM

## 2012-06-20 DIAGNOSIS — E785 Hyperlipidemia, unspecified: Secondary | ICD-10-CM

## 2012-06-20 DIAGNOSIS — F17200 Nicotine dependence, unspecified, uncomplicated: Secondary | ICD-10-CM

## 2012-06-20 DIAGNOSIS — N259 Disorder resulting from impaired renal tubular function, unspecified: Secondary | ICD-10-CM

## 2012-06-20 DIAGNOSIS — I10 Essential (primary) hypertension: Secondary | ICD-10-CM

## 2012-06-20 NOTE — Assessment & Plan Note (Signed)
Metformin will be held; she'll be asked to drink to thirst up to 32 ounces of fluids daily. Recheck labs in 10 weeks. Blood pressure control critical

## 2012-06-20 NOTE — Assessment & Plan Note (Signed)
Diabetes control is excellent. Metformin will be held because of the excellent control and because of the rising creatinine. A1c will be checked with fasting labs in 10 weeks

## 2012-06-20 NOTE — Assessment & Plan Note (Signed)
Blood pressure goals discussed. I stressed she will not be able to care for her daughter, unless she "cares for herself". Compliance with blood pressure medication administration stressed to prevent progressive kidney impairment and increased risk of stroke or heart attack

## 2012-06-20 NOTE — Assessment & Plan Note (Signed)
She is an active smoker, 5 cigarettes per day. This increases her risk of heart attack or stroke 2-3 times normal. This was discussed with her

## 2012-06-20 NOTE — Assessment & Plan Note (Signed)
She'll be asked to take her statin after the mid day meal as she believes it disturbed her sleep if taken at bedtime

## 2012-06-20 NOTE — Patient Instructions (Addendum)
Restart the cholesterol medicine prescribed by your cardiologist. Blood Pressure Goal  Ideally is an AVERAGE < 135/85. This AVERAGE should be calculated from @ least 5-7 BP readings taken @ different times of day on different days of week. You should not respond to isolated BP readings , but rather the AVERAGE for that week  Please  schedule fasting Labs in 10 weeks : BMET,Lipids, hepatic panel, A1c. PLEASE BRING THESE INSTRUCTIONS TO FOLLOW UP  LAB APPOINTMENT.This will guarantee correct labs are drawn, eliminating need for repeat blood sampling ( needle sticks ! ). Diagnoses /Codes: 250.00, 401.9,272.4. Please think about quitting smoking. Review the risks we discussed. Please call 1-800-QUIT-NOW ([phone]) for free smoking cessation counseling.

## 2012-06-20 NOTE — Progress Notes (Signed)
  Subjective:    Patient ID: Christine Meyer, female    DOB: 05-15-1951, 61 y.o.   MRN: [id number]  HPI She returns for followup of her diabetes and renal function. Her A1c was 6.2% which corresponds to an average sugar 132 and long-term risk of 24% on 06/13/12. Creatinine had increased to 1.4 on that date; it had been 1.2 on 08/23/10.  Her lipids were last checked 05/10/11 and were at goal    Review of Systems HYPERTENSION: Disease Monitoring: Blood pressure range- 130-140/?48- ? Chest pain, palpitations- no       Dyspnea- no Medications: Compliance-  not taken X 2 days;"I leave pill bottle when out caring for my daughter" Lightheadedness,Syncope- no    Edema- no  DIABETES: Disease Monitoring: Blood Sugar ranges-99- 101  Polyuria/phagia/dipsia- no       Visual problems- no Medications: Compliance-metformin was stopped by me 3 weeks ago because of the creatinine elevation and local A1c   Hypoglycemic symptoms- no  HYPERLIPIDEMIA: Disease Monitoring: See symptoms for Hypertension Medications: Compliance- she stopped her pravastatin a month ago, "because it won't let me sleep" Abd pain, bowel changes- no Muscle aches- no  She is taking 3 baby aspirin daily. She is smoking 5 cigarettes a day            Objective:   Physical Exam Gen.:  well-nourished in appearance. Alert and cooperative throughout exam.  Eyes: No corneal or conjunctival inflammation noted.  Mouth: Oral mucosa and oropharynx reveal no lesions or exudates. Teeth in good repair. Neck: No deformities, masses, or tenderness noted. Thyroid normal. Lungs: Normal respiratory effort; chest expands symmetrically. Lungs are clear to auscultation without rales, wheezes, or increased work of breathing. Heart: Normal rate and rhythm. Normal S1 and S2. No gallop, click, or rub. S4 with slurring at  LSB Abdomen: Bowel sounds normal; abdomen soft and nontender. No masses, organomegaly or hernias noted. No AAA or bruits                                                                                 Musculoskeletal/extremities:  No  cyanosis, edema, or deformity noted.Clubbing. Joints normal. Nail health  good. Vascular: Carotid & radial artery  are full and equal. L > R carotid bruits present.Decreased dorsalis pedis and  posterior tibial pulses Neurologic: Alert and oriented x3. Deep tendon reflexes symmetrical and normal.          Skin: Intact without suspicious lesions or rashes. Lymph: No cervical, axillary, or inguinal lymphadenopathy present. Psych: Mood and affect are normal. Normally interactive                                                                                         Assessment & Plan:

## 2012-09-01 ENCOUNTER — Other Ambulatory Visit (INDEPENDENT_AMBULATORY_CARE_PROVIDER_SITE_OTHER): Payer: Self-pay

## 2012-09-01 DIAGNOSIS — E119 Type 2 diabetes mellitus without complications: Secondary | ICD-10-CM

## 2012-09-01 LAB — HEMOGLOBIN A1C: Hgb A1c MFr Bld: 6.4 % (ref 4.6–6.5)

## 2013-01-18 ENCOUNTER — Other Ambulatory Visit: Payer: Self-pay | Admitting: Internal Medicine

## 2013-01-20 ENCOUNTER — Other Ambulatory Visit: Payer: Self-pay | Admitting: Cardiology

## 2013-01-20 NOTE — Telephone Encounter (Signed)
..   Requested Prescriptions   Pending Prescriptions Disp Refills  . pravastatin (PRAVACHOL) 80 MG tablet [Pharmacy Med Name: PRAVASTATIN 80MG  TABLETS] 30 tablet 1    Sig: TAKE 1 TABLET AT BEDTIME FOR CHOLESTEROL  .Marland KitchenPatient needs to contact office to schedule  Appointment  for future refills.[phone]. Thank you.

## 2013-07-08 ENCOUNTER — Ambulatory Visit (HOSPITAL_COMMUNITY): Payer: PRIVATE HEALTH INSURANCE | Attending: Cardiology

## 2013-07-08 DIAGNOSIS — I6529 Occlusion and stenosis of unspecified carotid artery: Secondary | ICD-10-CM | POA: Insufficient documentation

## 2013-07-08 DIAGNOSIS — I658 Occlusion and stenosis of other precerebral arteries: Secondary | ICD-10-CM | POA: Insufficient documentation

## 2013-07-08 DIAGNOSIS — F17200 Nicotine dependence, unspecified, uncomplicated: Secondary | ICD-10-CM | POA: Insufficient documentation

## 2013-07-08 DIAGNOSIS — E119 Type 2 diabetes mellitus without complications: Secondary | ICD-10-CM | POA: Insufficient documentation

## 2013-07-08 DIAGNOSIS — I10 Essential (primary) hypertension: Secondary | ICD-10-CM | POA: Insufficient documentation

## 2013-07-08 DIAGNOSIS — E785 Hyperlipidemia, unspecified: Secondary | ICD-10-CM | POA: Insufficient documentation

## 2013-11-27 ENCOUNTER — Other Ambulatory Visit: Payer: Self-pay | Admitting: Internal Medicine

## 2013-11-30 ENCOUNTER — Other Ambulatory Visit: Payer: Self-pay | Admitting: *Deleted

## 2013-11-30 NOTE — Telephone Encounter (Signed)
Error//AB/CMA 

## 2013-11-30 NOTE — Telephone Encounter (Signed)
Rx sent to the pharmacy by e-script.//AB/CMA 

## 2013-12-07 ENCOUNTER — Other Ambulatory Visit (INDEPENDENT_AMBULATORY_CARE_PROVIDER_SITE_OTHER): Payer: Self-pay

## 2013-12-07 ENCOUNTER — Encounter: Payer: Self-pay | Admitting: Internal Medicine

## 2013-12-07 ENCOUNTER — Ambulatory Visit (INDEPENDENT_AMBULATORY_CARE_PROVIDER_SITE_OTHER): Payer: Self-pay | Admitting: Internal Medicine

## 2013-12-07 VITALS — BP 180/60 | HR 81 | Temp 98.1°F | Resp 14 | Wt 155.4 lb

## 2013-12-07 DIAGNOSIS — E1159 Type 2 diabetes mellitus with other circulatory complications: Secondary | ICD-10-CM

## 2013-12-07 DIAGNOSIS — N259 Disorder resulting from impaired renal tubular function, unspecified: Secondary | ICD-10-CM

## 2013-12-07 DIAGNOSIS — I10 Essential (primary) hypertension: Secondary | ICD-10-CM

## 2013-12-07 DIAGNOSIS — F17200 Nicotine dependence, unspecified, uncomplicated: Secondary | ICD-10-CM

## 2013-12-07 DIAGNOSIS — E785 Hyperlipidemia, unspecified: Secondary | ICD-10-CM

## 2013-12-07 LAB — HEMOGLOBIN A1C: Hgb A1c MFr Bld: 6 % (ref 4.6–6.5)

## 2013-12-07 LAB — BASIC METABOLIC PANEL
BUN: 14 mg/dL (ref 6–23)
CHLORIDE: 102 meq/L (ref 96–112)
CO2: 29 meq/L (ref 19–32)
CREATININE: 1.1 mg/dL (ref 0.4–1.2)
Calcium: 9.5 mg/dL (ref 8.4–10.5)
GFR: 67.7 mL/min (ref >60.00)
GLUCOSE: 93 mg/dL (ref 70–99)
Potassium: 4.4 mEq/L (ref 3.5–5.1)
Sodium: 137 mEq/L (ref 135–145)

## 2013-12-07 MED ORDER — LISINOPRIL 20 MG PO TABS: ORAL_TABLET | ORAL | Status: DC

## 2013-12-07 MED ORDER — LOVASTATIN 20 MG PO TABS: 20.0000 mg | ORAL_TABLET | Freq: Every day | ORAL | Status: DC

## 2013-12-07 MED ORDER — METOPROLOL TARTRATE 25 MG PO TABS: 25.0000 mg | ORAL_TABLET | Freq: Two times a day (BID) | ORAL | Status: DC

## 2013-12-07 MED ORDER — ATORVASTATIN CALCIUM 20 MG PO TABS: 20.0000 mg | ORAL_TABLET | Freq: Every day | ORAL | Status: DC

## 2013-12-07 NOTE — Assessment & Plan Note (Signed)
BMET 

## 2013-12-07 NOTE — Progress Notes (Signed)
Pre visit review using our clinic review tool, if applicable. No additional management support is needed unless otherwise documented below in the visit note. 

## 2013-12-07 NOTE — Assessment & Plan Note (Signed)
BMET A1c 

## 2013-12-07 NOTE — Assessment & Plan Note (Signed)
Risk discussed 

## 2013-12-07 NOTE — Patient Instructions (Signed)
Your next office appointment will be determined based upon review of your pending labs. Those instructions will be transmitted to you through My Chart . Minimal Blood Pressure Goal= AVERAGE < 140/90;  Ideal is an AVERAGE < 135/85. This AVERAGE should be calculated from @ least 5-7 BP readings taken @ different times of day on different days of week. You should not respond to isolated BP readings , but rather the AVERAGE for that week .Please bring your  blood pressure cuff to office visits to verify that it is reliable.It  can also be checked against the blood pressure device at the pharmacy. Finger or wrist cuffs are not dependable; an arm cuff is. Please think about quitting smoking. Review the risks we discussed. Please call 1-800-QUIT-NOW (613)426-7656) for free smoking cessation counseling. There are multiple options are to help you stop smoking. These include nicotine patches, nicotine gum, and the new "E cigarette".

## 2013-12-07 NOTE — Assessment & Plan Note (Signed)
Metoprolol restarted

## 2013-12-07 NOTE — Progress Notes (Signed)
   Subjective:    Patient ID: Christine Meyer, female    DOB: 10/03/1950, 63 y.o.   MRN: [id number]  HPI HYPERTENSION: Disease Monitoring: Blood pressure range/ average :variable 178-193/? Medication Compliance:off Beta blocker ; she could not afford it  Pre Diabetes :  FBS range/average:no monitor Highest 2 hr post meal glucose:no monitor Medication compliance:no meds Hypoglycemia:no Ophthamology care:UTD; no retinopathy Podiatry care:not UTD  HYPERLIPIDEMIA: Disease Monitoring: Medication Compliance:statin D/Ced due to cost      Review of Systems Chest pain, palpitations:no       Dyspnea:occasionally with house work Edema:no Claudication: no Lightheadedness,Syncope:no Weight gain/loss:stable Polyuria/phagia/dipsia:no      Blurred vision /diplopia/lossof vision:no Limb numbness/tingling/burning:no Non healing skin lesions:no Abd pain, bowel changes: no  Myalgias: no Memory loss:no       Objective:   Physical Exam Appears healthy and well-nourished & in no acute distress  No carotid bruits are present.No neck pain distention present at 10 - 15 degrees. Thyroid normal to palpation  Heart rhythm and rate are normal with no gallop or murmur  Chest : mild scattered low grade rhonchi; no increased work of breathing  There is no evidence of aortic aneurysm or renal artery bruits  Abdomen soft with no organomegaly or masses. No HJR  Clubbing w/o cyanosis or edema present.  Pedal pulses are decreased  No ischemic skin changes are present . Fingernails/ toenails healthy   Alert and oriented. Strength, tone, DTRs reflexes normal          Assessment & Plan:  See Current Assessment & Plan in Problem List under specific Diagnosis

## 2013-12-08 ENCOUNTER — Other Ambulatory Visit: Payer: Self-pay | Admitting: Internal Medicine

## 2013-12-08 DIAGNOSIS — I10 Essential (primary) hypertension: Secondary | ICD-10-CM

## 2013-12-08 DIAGNOSIS — E785 Hyperlipidemia, unspecified: Secondary | ICD-10-CM

## 2014-03-17 ENCOUNTER — Telehealth: Payer: Self-pay | Admitting: *Deleted

## 2014-03-17 DIAGNOSIS — E1159 Type 2 diabetes mellitus with other circulatory complications: Secondary | ICD-10-CM

## 2014-03-17 NOTE — Telephone Encounter (Signed)
Left message on machine for patient to schedule a lab appointment for cholesterol. Lipid ordered Diabetic bundle

## 2014-05-28 ENCOUNTER — Telehealth: Payer: Self-pay | Admitting: Family Medicine

## 2014-05-28 NOTE — Telephone Encounter (Signed)
Pt is a patient of Dr Darrol Angel, but Dr Etter Sjogren has done her paps in the past. Pt has a inflame cyst on her vagina. Will Dr Etter Sjogren see pt for this? Pt is requesting Dr Etter Sjogren. Please advise.

## 2014-05-28 NOTE — Telephone Encounter (Signed)
Ok but not today

## 2014-05-28 NOTE — Telephone Encounter (Signed)
appt 9/21

## 2014-05-28 NOTE — Telephone Encounter (Signed)
Please advise      KP 

## 2014-05-31 ENCOUNTER — Encounter: Payer: Self-pay | Admitting: Family Medicine

## 2014-05-31 ENCOUNTER — Ambulatory Visit (INDEPENDENT_AMBULATORY_CARE_PROVIDER_SITE_OTHER): Payer: Medicare Other | Admitting: Family Medicine

## 2014-05-31 VITALS — BP 180/54 | HR 66 | Temp 98.2°F | Wt 155.6 lb

## 2014-05-31 DIAGNOSIS — N760 Acute vaginitis: Secondary | ICD-10-CM

## 2014-05-31 DIAGNOSIS — E785 Hyperlipidemia, unspecified: Secondary | ICD-10-CM

## 2014-05-31 DIAGNOSIS — I10 Essential (primary) hypertension: Secondary | ICD-10-CM | POA: Diagnosis not present

## 2014-05-31 LAB — BASIC METABOLIC PANEL
BUN: 13 mg/dL (ref 6–23)
CALCIUM: 9 mg/dL (ref 8.4–10.5)
CO2: 25 mEq/L (ref 19–32)
CREATININE: 1.1 mg/dL (ref 0.4–1.2)
Chloride: 108 mEq/L (ref 96–112)
GFR: 67.6 mL/min (ref >60.00)
GLUCOSE: 66 mg/dL — AB (ref 70–99)
Potassium: 3.7 mEq/L (ref 3.5–5.1)
Sodium: 143 mEq/L (ref 135–145)

## 2014-05-31 LAB — LIPID PANEL
Cholesterol: 181 mg/dL (ref 0–200)
HDL: 46.2 mg/dL (ref >39.00)
LDL Cholesterol: 110 mg/dL — ABNORMAL HIGH (ref 0–99)
NONHDL: 134.8
Total CHOL/HDL Ratio: 4
Triglycerides: 126 mg/dL (ref 0.0–149.0)
VLDL: 25.2 mg/dL (ref 0.0–40.0)

## 2014-05-31 LAB — HEPATIC FUNCTION PANEL
ALBUMIN: 3.9 g/dL (ref 3.5–5.2)
ALT: 12 U/L (ref 0–35)
AST: 18 U/L (ref 0–37)
Alkaline Phosphatase: 82 U/L (ref 39–117)
Bilirubin, Direct: 0 mg/dL (ref 0.0–0.3)
Total Bilirubin: 0.3 mg/dL (ref 0.2–1.2)
Total Protein: 7.6 g/dL (ref 6.0–8.3)

## 2014-05-31 MED ORDER — LISINOPRIL 40 MG PO TABS: 40.0000 mg | ORAL_TABLET | Freq: Every day | ORAL | Status: DC

## 2014-05-31 MED ORDER — LOVASTATIN 20 MG PO TABS
20.0000 mg | ORAL_TABLET | Freq: Every day | ORAL | Status: DC
Start: 2014-05-31 — End: 2014-05-31

## 2014-05-31 MED ORDER — METOPROLOL TARTRATE 25 MG PO TABS: 25.0000 mg | ORAL_TABLET | Freq: Two times a day (BID) | ORAL | Status: DC

## 2014-05-31 MED ORDER — LOVASTATIN 20 MG PO TABS: 20.0000 mg | ORAL_TABLET | Freq: Every day | ORAL | Status: DC

## 2014-05-31 MED ORDER — AMOXICILLIN-POT CLAVULANATE 875-125 MG PO TABS: 1.0000 | ORAL_TABLET | Freq: Two times a day (BID) | ORAL | Status: DC

## 2014-05-31 NOTE — Progress Notes (Signed)
Pre visit review using our clinic review tool, if applicable. No additional management support is needed unless otherwise documented below in the visit note. 

## 2014-05-31 NOTE — Patient Instructions (Signed)
Smoking Cessation, Tips for Success  If you are ready to quit smoking, congratulations! You have chosen to help yourself be healthier. Cigarettes bring nicotine, tar, carbon monoxide, and other irritants into your body. Your lungs, heart, and blood vessels will be able to work better without these poisons. There are many different ways to quit smoking. Nicotine gum, nicotine patches, a nicotine inhaler, or nicotine nasal spray can help with physical craving. Hypnosis, support groups, and medicines help break the habit of smoking.  WHAT THINGS CAN I DO TO MAKE QUITTING EASIER?   Here are some tips to help you quit for good:  · Pick a date when you will quit smoking completely. Tell all of your friends and family about your plan to quit on that date.  · Do not try to slowly cut down on the number of cigarettes you are smoking. Pick a quit date and quit smoking completely starting on that day.  · Throw away all cigarettes.    · Clean and remove all ashtrays from your home, work, and car.  · On a card, write down your reasons for quitting. Carry the card with you and read it when you get the urge to smoke.  · Cleanse your body of nicotine. Drink enough water and fluids to keep your urine clear or pale yellow. Do this after quitting to flush the nicotine from your body.  · Learn to predict your moods. Do not let a bad situation be your excuse to have a cigarette. Some situations in your life might tempt you into wanting a cigarette.  · Never have "just one" cigarette. It leads to wanting another and another. Remind yourself of your decision to quit.  · Change habits associated with smoking. If you smoked while driving or when feeling stressed, try other activities to replace smoking. Stand up when drinking your coffee. Brush your teeth after eating. Sit in a different chair when you read the paper. Avoid alcohol while trying to quit, and try to drink fewer caffeinated beverages. Alcohol and caffeine may urge you to  smoke.  · Avoid foods and drinks that can trigger a desire to smoke, such as sugary or spicy foods and alcohol.  · Ask people who smoke not to smoke around you.  · Have something planned to do right after eating or having a cup of coffee. For example, plan to take a walk or exercise.  · Try a relaxation exercise to calm you down and decrease your stress. Remember, you may be tense and nervous for the first 2 weeks after you quit, but this will pass.  · Find new activities to keep your hands busy. Play with a pen, coin, or rubber band. Doodle or draw things on paper.  · Brush your teeth right after eating. This will help cut down on the craving for the taste of tobacco after meals. You can also try mouthwash.    · Use oral substitutes in place of cigarettes. Try using lemon drops, carrots, cinnamon sticks, or chewing gum. Keep them handy so they are available when you have the urge to smoke.  · When you have the urge to smoke, try deep breathing.  · Designate your home as a nonsmoking area.  · If you are a heavy smoker, ask your health care provider about a prescription for nicotine chewing gum. It can ease your withdrawal from nicotine.  · Reward yourself. Set aside the cigarette money you save and buy yourself something nice.  · Look for   support from others. Join a support group or smoking cessation program. Ask someone at home or at work to help you with your plan to quit smoking.  · Always ask yourself, "Do I need this cigarette or is this just a reflex?" Tell yourself, "Today, I choose not to smoke," or "I do not want to smoke." You are reminding yourself of your decision to quit.  · Do not replace cigarette smoking with electronic cigarettes (commonly called e-cigarettes). The safety of e-cigarettes is unknown, and some may contain harmful chemicals.  · If you relapse, do not give up! Plan ahead and think about what you will do the next time you get the urge to smoke.  HOW WILL I FEEL WHEN I QUIT SMOKING?  You  may have symptoms of withdrawal because your body is used to nicotine (the addictive substance in cigarettes). You may crave cigarettes, be irritable, feel very hungry, cough often, get headaches, or have difficulty concentrating. The withdrawal symptoms are only temporary. They are strongest when you first quit but will go away within 10-14 days. When withdrawal symptoms occur, stay in control. Think about your reasons for quitting. Remind yourself that these are signs that your body is healing and getting used to being without cigarettes. Remember that withdrawal symptoms are easier to treat than the major diseases that smoking can cause.   Even after the withdrawal is over, expect periodic urges to smoke. However, these cravings are generally short lived and will go away whether you smoke or not. Do not smoke!  WHAT RESOURCES ARE AVAILABLE TO HELP ME QUIT SMOKING?  Your health care provider can direct you to community resources or hospitals for support, which may include:  · Group support.  · Education.  · Hypnosis.  · Therapy.  Document Released: 05/25/2004 Document Revised: 01/11/2014 Document Reviewed: 02/12/2013  [employer]® Patient Information ©2015 [employer]. This information is not intended to replace advice given to you by your health care provider. Make sure you discuss any questions you have with your health care provider.

## 2014-05-31 NOTE — Progress Notes (Signed)
Subjective:    Patient ID: Christine Meyer, female    DOB: [birth year], 63 y.o.   MRN: [id number]  HPI Pt here c/o abscess in vulval on L side.  It started drainging over the weekend.  It was very painful but has improved she it started to drain She is here to f/u bp and cholesterol also.     Review of Systems As above    Past Medical History  Diagnosis Date  . Anemia     NOS  . Diabetes mellitus, type 2   . Hypertension   . Uterine cancer    Family History  Problem Relation Age of Onset  . Heart failure Mother     after hip fx.   . Hypertension Mother   . Diabetes Mother   . Deep vein thrombosis Mother   . Hypertension Father   . Diabetes Sister     insulin dependent  . Hypertension Brother   . Emphysema Brother   . Diabetes Brother     insulin dependent  . Diabetes Maternal Aunt     non-insulent dependent  . Heart attack Paternal Aunt   . Diabetes Maternal Grandmother     non-insulin dependent   Current Outpatient Prescriptions  Medication Sig Dispense Refill  . aspirin (BABY ASPIRIN) 81 MG chewable tablet Chew 81 mg by mouth daily.        . Calcium Carbonate (CALTRATE 600 PO) Take by mouth daily.        Marland Kitchen lovastatin (MEVACOR) 20 MG tablet Take 1 tablet (20 mg total) by mouth at bedtime.  30 tablet  2  . metoprolol tartrate (LOPRESSOR) 25 MG tablet Take 1 tablet (25 mg total) by mouth 2 (two) times daily.  60 tablet  2  . Multiple Vitamins-Minerals (MULTIVITAL) tablet Take 1 tablet by mouth daily.        . niacin 500 MG CR capsule Take 500 mg by mouth at bedtime.        . ranitidine (ZANTAC) 150 MG capsule Take 150 mg by mouth. 1 every 12 hrs pre meals as needed for heartburn       . amoxicillin-clavulanate (AUGMENTIN) 875-125 MG per tablet Take 1 tablet by mouth 2 (two) times daily.  20 tablet  0  . lisinopril (PRINIVIL,ZESTRIL) 40 MG tablet Take 1 tablet (40 mg total) by mouth daily.  90 tablet  3   No current facility-administered medications for this visit.      Objective:   Physical Exam  BP 180/54  Pulse 66  Temp(Src) 98.2 F (36.8 C) (Oral)  Wt 155 lb 10.3 oz (70.6 kg)  SpO2 100% General appearance: alert, cooperative, appears stated age and no distress Throat: lips, mucosa, and tongue normal; teeth and gums normal Neck: no adenopathy, supple, symmetrical, trachea midline and thyroid not enlarged, symmetric, no tenderness/mass/nodules Lungs: clear to auscultation bilaterally Heart: S1, S2 normal Pelvic: + draining abscess L labia, culture down Extremities: extremities normal, atraumatic, no cyanosis or edema       Assessment & Plan:  1. Abscess, vagina con't to soak , take abx == if it does not improve significantly in 2-3 days--- refer to gyn  - amoxicillin-clavulanate (AUGMENTIN) 875-125 MG per tablet; Take 1 tablet by mouth 2 (two) times daily.  Dispense: 20 tablet; Refill: 0 - Wound culture  2. Essential hypertension Elevated-- increase med - lisinopril (PRINIVIL,ZESTRIL) 40 MG tablet; Take 1 tablet (40 mg total) by mouth daily.  Dispense: 90 tablet; Refill: 3  3.  HYPERTENSION   - Basic metabolic panel - metoprolol tartrate (LOPRESSOR) 25 MG tablet; Take 1 tablet (25 mg total) by mouth 2 (two) times daily.  Dispense: 60 tablet; Refill: 2  4. Dyslipidemia  Check labs - Hepatic function panel - Lipid panel - lovastatin (MEVACOR) 20 MG tablet; Take 1 tablet (20 mg total) by mouth at bedtime.  Dispense: 30 tablet; Refill: 2

## 2014-06-03 LAB — WOUND CULTURE: Gram Stain: NONE SEEN

## 2014-06-09 ENCOUNTER — Other Ambulatory Visit: Payer: Self-pay

## 2014-06-09 DIAGNOSIS — E785 Hyperlipidemia, unspecified: Secondary | ICD-10-CM

## 2014-06-09 MED ORDER — LOVASTATIN 40 MG PO TABS: 40.0000 mg | ORAL_TABLET | Freq: Every day | ORAL | Status: DC

## 2014-06-23 ENCOUNTER — Other Ambulatory Visit (HOSPITAL_COMMUNITY): Payer: Self-pay | Admitting: *Deleted

## 2014-06-23 DIAGNOSIS — I6523 Occlusion and stenosis of bilateral carotid arteries: Secondary | ICD-10-CM

## 2014-07-12 ENCOUNTER — Other Ambulatory Visit: Payer: Self-pay | Admitting: Family Medicine

## 2014-07-14 ENCOUNTER — Ambulatory Visit (HOSPITAL_COMMUNITY): Payer: Medicare Other | Attending: Cardiology | Admitting: *Deleted

## 2014-07-14 DIAGNOSIS — E119 Type 2 diabetes mellitus without complications: Secondary | ICD-10-CM | POA: Insufficient documentation

## 2014-07-14 DIAGNOSIS — I10 Essential (primary) hypertension: Secondary | ICD-10-CM | POA: Insufficient documentation

## 2014-07-14 DIAGNOSIS — I6523 Occlusion and stenosis of bilateral carotid arteries: Secondary | ICD-10-CM

## 2014-07-14 DIAGNOSIS — E785 Hyperlipidemia, unspecified: Secondary | ICD-10-CM | POA: Diagnosis not present

## 2014-07-14 DIAGNOSIS — Z720 Tobacco use: Secondary | ICD-10-CM | POA: Insufficient documentation

## 2014-07-14 NOTE — Progress Notes (Signed)
Carotid duplex complete 

## 2014-07-26 ENCOUNTER — Telehealth: Payer: Self-pay | Admitting: Cardiology

## 2014-07-26 NOTE — Telephone Encounter (Signed)
**Note De-Identified Christine Meyer Obfuscation** The pt has been given her Carotid Duplex results, she verbalized understanding.

## 2014-07-26 NOTE — Telephone Encounter (Signed)
Returning a call from last Thursday,she does not know who it was. She thinks it might be about her test results.

## 2014-08-24 ENCOUNTER — Ambulatory Visit (INDEPENDENT_AMBULATORY_CARE_PROVIDER_SITE_OTHER): Payer: Medicare Other | Admitting: Medical

## 2014-08-24 ENCOUNTER — Ambulatory Visit (HOSPITAL_BASED_OUTPATIENT_CLINIC_OR_DEPARTMENT_OTHER)
Admission: RE | Admit: 2014-08-24 | Discharge: 2014-08-24 | Disposition: A | Discharge status: Home / Self Care | Payer: Medicare Other | Source: Ambulatory Visit | Attending: Medical | Admitting: Medical

## 2014-08-24 ENCOUNTER — Encounter: Payer: Self-pay | Admitting: Medical

## 2014-08-24 ENCOUNTER — Telehealth: Payer: Self-pay | Admitting: Internal Medicine

## 2014-08-24 ENCOUNTER — Encounter (HOSPITAL_BASED_OUTPATIENT_CLINIC_OR_DEPARTMENT_OTHER): Payer: Self-pay | Admitting: *Deleted

## 2014-08-24 ENCOUNTER — Telehealth: Payer: Self-pay | Admitting: Medical

## 2014-08-24 ENCOUNTER — Emergency Department (HOSPITAL_BASED_OUTPATIENT_CLINIC_OR_DEPARTMENT_OTHER)
Admission: EM | Admit: 2014-08-24 | Discharge: 2014-08-24 | Disposition: A | Discharge status: Home / Self Care | Payer: Medicare Other | Attending: Emergency Medicine | Admitting: Emergency Medicine

## 2014-08-24 ENCOUNTER — Emergency Department (HOSPITAL_BASED_OUTPATIENT_CLINIC_OR_DEPARTMENT_OTHER): Payer: Medicare Other

## 2014-08-24 VITALS — BP 170/80 | HR 82 | Temp 98.0°F | Ht 63.5 in | Wt 155.8 lb

## 2014-08-24 DIAGNOSIS — Z862 Personal history of diseases of the blood and blood-forming organs and certain disorders involving the immune mechanism: Secondary | ICD-10-CM | POA: Diagnosis not present

## 2014-08-24 DIAGNOSIS — R0602 Shortness of breath: Secondary | ICD-10-CM | POA: Diagnosis not present

## 2014-08-24 DIAGNOSIS — J449 Chronic obstructive pulmonary disease, unspecified: Secondary | ICD-10-CM

## 2014-08-24 DIAGNOSIS — M79604 Pain in right leg: Secondary | ICD-10-CM | POA: Diagnosis not present

## 2014-08-24 DIAGNOSIS — R0600 Dyspnea, unspecified: Secondary | ICD-10-CM | POA: Diagnosis not present

## 2014-08-24 DIAGNOSIS — M25561 Pain in right knee: Secondary | ICD-10-CM | POA: Diagnosis not present

## 2014-08-24 DIAGNOSIS — R7989 Other specified abnormal findings of blood chemistry: Secondary | ICD-10-CM | POA: Insufficient documentation

## 2014-08-24 DIAGNOSIS — I739 Peripheral vascular disease, unspecified: Secondary | ICD-10-CM

## 2014-08-24 DIAGNOSIS — Z8542 Personal history of malignant neoplasm of other parts of uterus: Secondary | ICD-10-CM | POA: Diagnosis not present

## 2014-08-24 DIAGNOSIS — Z79899 Other long term (current) drug therapy: Secondary | ICD-10-CM | POA: Diagnosis not present

## 2014-08-24 DIAGNOSIS — M545 Low back pain: Secondary | ICD-10-CM | POA: Insufficient documentation

## 2014-08-24 DIAGNOSIS — N289 Disorder of kidney and ureter, unspecified: Secondary | ICD-10-CM | POA: Diagnosis not present

## 2014-08-24 DIAGNOSIS — Z720 Tobacco use: Secondary | ICD-10-CM | POA: Insufficient documentation

## 2014-08-24 DIAGNOSIS — J8410 Pulmonary fibrosis, unspecified: Secondary | ICD-10-CM | POA: Diagnosis not present

## 2014-08-24 DIAGNOSIS — I10 Essential (primary) hypertension: Secondary | ICD-10-CM | POA: Diagnosis not present

## 2014-08-24 DIAGNOSIS — I6523 Occlusion and stenosis of bilateral carotid arteries: Secondary | ICD-10-CM

## 2014-08-24 DIAGNOSIS — Z7951 Long term (current) use of inhaled steroids: Secondary | ICD-10-CM | POA: Diagnosis not present

## 2014-08-24 DIAGNOSIS — R748 Abnormal levels of other serum enzymes: Secondary | ICD-10-CM | POA: Diagnosis not present

## 2014-08-24 DIAGNOSIS — Z87891 Personal history of nicotine dependence: Secondary | ICD-10-CM | POA: Insufficient documentation

## 2014-08-24 DIAGNOSIS — Z7982 Long term (current) use of aspirin: Secondary | ICD-10-CM | POA: Diagnosis not present

## 2014-08-24 DIAGNOSIS — J984 Other disorders of lung: Secondary | ICD-10-CM | POA: Diagnosis not present

## 2014-08-24 DIAGNOSIS — E119 Type 2 diabetes mellitus without complications: Secondary | ICD-10-CM | POA: Insufficient documentation

## 2014-08-24 DIAGNOSIS — M25569 Pain in unspecified knee: Secondary | ICD-10-CM | POA: Insufficient documentation

## 2014-08-24 LAB — COMPREHENSIVE METABOLIC PANEL
ALBUMIN: 4.1 g/dL (ref 3.5–5.2)
ALK PHOS: 68 U/L (ref 39–117)
ALT: 14 U/L (ref 0–35)
AST: 19 U/L (ref 0–37)
BUN: 13 mg/dL (ref 6–23)
CHLORIDE: 107 meq/L (ref 96–112)
CO2: 24 mEq/L (ref 19–32)
Calcium: 9.4 mg/dL (ref 8.4–10.5)
Creatinine, Ser: 1.1 mg/dL (ref 0.4–1.2)
GFR: 64.7 mL/min (ref >60.00)
Glucose, Bld: 82 mg/dL (ref 70–99)
POTASSIUM: 3.8 meq/L (ref 3.5–5.1)
SODIUM: 137 meq/L (ref 135–145)
TOTAL PROTEIN: 7.5 g/dL (ref 6.0–8.3)
Total Bilirubin: 0.4 mg/dL (ref 0.2–1.2)

## 2014-08-24 LAB — D-DIMER, QUANTITATIVE: D-Dimer, Quant: 0.51 ug/mL-FEU — ABNORMAL HIGH (ref 0.00–0.48)

## 2014-08-24 MED ORDER — ALBUTEROL SULFATE HFA 108 (90 BASE) MCG/ACT IN AERS: 2.0000 | INHALATION_SPRAY | Freq: Four times a day (QID) | RESPIRATORY_TRACT | Status: DC | PRN

## 2014-08-24 MED ORDER — AMLODIPINE BESYLATE 5 MG PO TABS: 5.0000 mg | ORAL_TABLET | Freq: Every day | ORAL | Status: DC

## 2014-08-24 MED ORDER — IOHEXOL 350 MG/ML SOLN
100.0000 mL | Freq: Once | INTRAVENOUS | Status: AC | PRN
  Administered 2014-08-24: 100 mL via INTRAVENOUS

## 2014-08-24 MED ORDER — MOMETASONE FUROATE 220 MCG/INH IN AEPB: 1.0000 | INHALATION_SPRAY | Freq: Two times a day (BID) | RESPIRATORY_TRACT | Status: DC

## 2014-08-24 NOTE — Telephone Encounter (Signed)
I talked with pt and her daughter. D-dimer mild elevated. After I saw her today she did have 2 minute or so shortness of breath episode. I tried to call radiology and get the ct of chest to r/o PE ordered  but needed the cmp results which are not back yet. Therefore I advised daughter to take mom to ED at the med center and notify them of recent shortness of breath episodes d-dimer result and work up I did earlier today.ED could get cmp stat and do CT.

## 2014-08-24 NOTE — Discharge Instructions (Signed)
Shortness of Breath Shortness of breath means you have trouble breathing. It could also mean that you have a medical problem. You should get immediate medical care for shortness of breath. CAUSES   Not enough oxygen in the air such as with high altitudes or a smoke-filled room.  Certain lung diseases, infections, or problems.  Heart disease or conditions, such as angina or heart failure.  Low red blood cells (anemia).  Poor physical fitness, which can cause shortness of breath when you exercise.  Chest or back injuries or stiffness.  Being overweight.  Smoking.  Anxiety, which can make you feel like you are not getting enough air. DIAGNOSIS  Serious medical problems can often be found during your physical exam. Tests may also be done to determine why you are having shortness of breath. Tests may include:  Chest X-rays.  Lung function tests.  Blood tests.  An electrocardiogram (ECG).  An ambulatory electrocardiogram. An ambulatory ECG records your heartbeat patterns over a 24-hour period.  Exercise testing.  A transthoracic echocardiogram (TTE). During echocardiography, sound waves are used to evaluate how blood flows through your heart.  A transesophageal echocardiogram (TEE).  Imaging scans. Your health care provider may not be able to find a cause for your shortness of breath after your exam. In this case, it is important to have a follow-up exam with your health care provider as directed.  TREATMENT  Treatment for shortness of breath depends on the cause of your symptoms and can vary greatly. HOME CARE INSTRUCTIONS   Do not smoke. Smoking is a common cause of shortness of breath. If you smoke, ask for help to quit.  Avoid being around chemicals or things that may bother your breathing, such as paint fumes and dust.  Rest as needed. Slowly resume your usual activities.  If medicines were prescribed, take them as directed for the full length of time directed. This  includes oxygen and any inhaled medicines.  Keep all follow-up appointments as directed by your health care provider. SEEK MEDICAL CARE IF:   Your condition does not improve in the time expected.  You have a hard time doing your normal activities even with rest.  You have any new symptoms. SEEK IMMEDIATE MEDICAL CARE IF:   Your shortness of breath gets worse.  You feel light-headed, faint, or develop a cough not controlled with medicines.  You start coughing up blood.  You have pain with breathing.  You have chest pain or pain in your arms, shoulders, or abdomen.  You have a fever.  You are unable to walk up stairs or exercise the way you normally do. MAKE SURE YOU:  Understand these instructions.  Will watch your condition.  Will get help right away if you are not doing well or get worse. Document Released: 05/22/2001 Document Revised: 09/01/2013 Document Reviewed: 11/12/2011 [facility] Patient Information 2015 [place], [place]. This information is not intended to replace advice given to you by your health care provider. Make sure you discuss any questions you have with your health care provider.  Intermittent Claudication Blockage of leg arteries results from poor circulation of blood in the leg arteries. This produces an aching, tired, and sometimes burning pain in the legs that is brought on by exercise and made better by rest. Claudication refers to the limping that happens from leg cramps. It is also referred to as Vaso-occlusive disease of the legs, arterial insufficiency of the legs, recurrent leg pain, recurrent leg cramping and calf pain with exercise.  CAUSES  This condition is due to narrowing or blockage of the arteries (muscular vessels which carry blood away from the heart and around the body). Blockage of arteries can occur anywhere in the body. If they occur in the heart, a person may experience angina (chest pain) or even a heart attack. If they occur in the neck  or the brain, a person may have a stroke. Intermittent claudication is when the blockage occurs in the legs, most commonly in the calf or the foot.  Atherosclerosis, or blockage of arteries, can occur for many reasons. Some of these are smoking, diabetes, and high cholesterol. SYMPTOMS  Intermittent claudication may occur in both legs, and it often continues to get worse over time. However, some people complain only of weakness in the legs when walking, or a feeling of "tiredness" in the buttocks. Impotence (not able to have an erection) is an occasional complaint in men. Pain while resting is uncommon.  WHAT TO EXPECT AT [facility] PROVIDER'S OFFICE: Your medical history will be asked for and a physical examination will be performed. Medical history questions documenting claudication in detail may include:   Time pattern  Do you have leg cramps at night (nocturnal cramps)?  How often does leg pain with cramping occur?  Is it getting worse?  What is the quality of the pain?  Is the pain sharp?  Is there an aching pain with the cramps?  Aggravating factors  Is it worse after you exercise?  Is it worse after you are standing for a while?  Do you smoke? How much?  Do you drink alcohol? How much?  Are you diabetic? How well is your blood sugar controlled?  Other  What other symptoms are also present?  Has there been impotence (men)?  Is there pain in the back?  Is there a darkening of the skin of the legs, feet or toes?  Is there weakness or paralysis of the legs? The physical examination may include evaluation of the femoral pulse (in the groin) and the other areas where the pulse can be felt in the legs. DIAGNOSIS  Diagnostic tests that may be performed include:  Blood pressure measured in arms and legs for comparison.  Doppler ultrasonography on the legs and the heart.  Duplex Doppler/ultrasound exam of extremity to visualize arterial blood flow.  ECG- to  evaluate the activity of your heart.  Aortography- to visualize blockages in your arteries. TREATMENT Surgical treatment may be suggested if claudication interferes with the patient's activities or work, and if the diseased arteries do not seem to be improving after treatment. Be aware that this condition can worsen over time and you should carefully monitor your condition. HOME CARE INSTRUCTIONS  Talk to your caregiver about the cause of your leg cramping and about what to do at home to relieve it.  A healthy diet is important to lessen the likeliness of atherosclerosis.  A program of daily walking for short periods, and stopping for pain or cramping, may help improve function.  It is important to stop smoking.  Avoid putting hot or cold items on legs.  Avoid tight shoes. SEEK MEDICAL CARE IF: There are many other causes of leg pain such as arthritis or low blood potassium. However, some causes of leg pain may be life threatening such as a blood clot in the legs. Seek medical attention if you have:  Leg pain that does not go away.  Legs that may be red, hot or swollen.  Ulcers or sores appear on your ankle or foot.  Any chest pain or shortness of breath accompanying leg pain.  Diabetes.  You are pregnant. SEEK IMMEDIATE MEDICAL CARE IF:   Your leg pain becomes severe or will not go away.  Your foot turns blue or a dark color.  Your leg becomes red, hot or swollen or you develop a fever over 102F.  Any chest pain or shortness of breath accompanying leg pain. MAKE SURE YOU:   Understand these instructions.  Will watch your condition.  Will get help right away if you are not doing well or get worse. Document Released: 06/29/2004 Document Revised: 11/19/2011 Document Reviewed: 12/03/2013 [facility] Patient Information 2015 [place], [place]. This information is not intended to replace advice given to you by your health care provider. Make sure you discuss any questions you  have with your health care provider.

## 2014-08-24 NOTE — Patient Instructions (Addendum)
With your rt leg pain and recent mild shortness of breath, I will get ekg, cxr, d-dimer, and rt lower extremity doppler.  In event d-dimer elevated will go ahead and get cmp as well.  Your shortness of breath may be related to copd and therefore will get cxr as well. In addition will rx qvar and albuterol inhaler.   Your blood pressure is high today and will add low dose amlodipine 5 mg.  Labs will be done stat today. And I want you to stay in radiology until your doppler result is back.  I will call you with stat d-dimer later this afternoon if this test is positive outpatient study may be done or may advise ED evaluation.  Follow up in 7 days or as needed.

## 2014-08-24 NOTE — Telephone Encounter (Signed)
I got cmp results at 6:10 pm about an hour after I advised going to ED due to her intermittent shortness of breath and elvated d dimer.

## 2014-08-24 NOTE — ED Notes (Signed)
Pt c/o bil lower leg pain and SOB , seen at [facility] today with elevated d-dimer here for eval

## 2014-08-24 NOTE — Assessment & Plan Note (Addendum)
Your blood pressure is high today and will add low dose amlodipine 5 mg.  Continue current bp meds as well.

## 2014-08-24 NOTE — Progress Notes (Signed)
Pre visit review using our clinic review tool, if applicable. No additional management support is needed unless otherwise documented below in the visit note. 

## 2014-08-24 NOTE — ED Provider Notes (Signed)
CSN: [id number]     Arrival date & time 08/24/14  3016 History   This chart was scribed for Ernestina Patches, MD by Hilda Lias, ED Scribe. This patient was seen in room MH09/MH09 and the patient's care was started at 8:13 PM.   Chief Complaint  Patient presents with  . Abnormal lab      The history is provided by the patient and a relative. No language interpreter was used.     HPI Comments: Christine Meyer is a 63 y.o. female who presents to the Emergency Department complaining of an abnormal lab that she received today when she had an appointment upstairs in this facility. Pt states that she got an appointment here because her leg was "giving out" when she was doing daily activities around the house with associated cramping and SOB that has been present for several months, but has worsened in the last 3-4 weeks. Her daughter states that a D-Dimer, doppler, and CXR were ordered, and abnormal results were found on the D-dimer, as it was elevated. Pt was referred to ER because of her D-Dimer being elevated. Pt states that she has a stent in her right leg, and that the pain occurs bilaterally, but is worse in her right leg than in her left. Pt states that when the onset of her symptoms occur she has to stop whatever she is doing. Pt also states that her feet get very cold at night, and that she has to wear socks when she sleeps because of this. In addition to leg pain symptoms she also states that she has had back pain for several months and has been coughing and sneezing intermittently the past 2-3 weeks. Pt denies chest pain, abdominal pain, and diaphoresis.        Past Medical History  Diagnosis Date  . Anemia     NOS  . Diabetes mellitus, type 2   . Hypertension   . Uterine cancer    Past Surgical History  Procedure Laterality Date  . Hysterectomy (other)      and BSO for uterine CA  . Right renal art stent  May 2007  . Right renal art sten for restenosis  Dec 2007  . Rca stent   1999    Dr. Melvern Banker G4 P4  . Colonoscopy neg  2000   Family History  Problem Relation Age of Onset  . Heart failure Mother     after hip fx.   . Hypertension Mother   . Diabetes Mother   . Deep vein thrombosis Mother   . Hypertension Father   . Diabetes Sister     insulin dependent  . Hypertension Brother   . Emphysema Brother   . Diabetes Brother     insulin dependent  . Diabetes Maternal Aunt     non-insulent dependent  . Heart attack Paternal Aunt   . Diabetes Maternal Grandmother     non-insulin dependent   History  Substance Use Topics  . Smoking status: Smoker, Current Status Unknown -- 0.30 packs/day  . Smokeless tobacco: Never Used  . Alcohol Use: No   OB History    No data available     Review of Systems  Constitutional: Negative for fever, chills, diaphoresis, activity change, appetite change and fatigue.  HENT: Negative for congestion, facial swelling, rhinorrhea and sore throat.   Eyes: Negative for photophobia and discharge.  Respiratory: Positive for shortness of breath. Negative for cough and chest tightness.   Cardiovascular: Negative for chest pain,  palpitations and leg swelling.  Gastrointestinal: Negative for nausea, vomiting, abdominal pain and diarrhea.  Endocrine: Negative for polydipsia and polyuria.  Genitourinary: Negative for dysuria, frequency, difficulty urinating and pelvic pain.  Musculoskeletal: Positive for back pain. Negative for arthralgias, neck pain and neck stiffness.  Skin: Negative for color change and wound.  Allergic/Immunologic: Negative for immunocompromised state.  Neurological: Positive for weakness. Negative for facial asymmetry, numbness and headaches.  Hematological: Does not bruise/bleed easily.  Psychiatric/Behavioral: Negative for confusion and agitation.      Allergies  Review of patient's allergies indicates no known allergies.  Home Medications   Prior to Admission medications   Medication Sig Start Date  End Date Taking? Authorizing Provider  albuterol (PROVENTIL HFA;VENTOLIN HFA) 108 (90 BASE) MCG/ACT inhaler Inhale 2 puffs into the lungs every 6 (six) hours as needed for wheezing or shortness of breath. 08/24/14   Meriam Sprague [place], PA-C  amLODipine (NORVASC) 5 MG tablet Take 1 tablet (5 mg total) by mouth daily. 08/24/14   [place], PA-C  aspirin (BABY ASPIRIN) 81 MG chewable tablet Chew 81 mg by mouth daily.      Historical Provider, MD  Calcium Carbonate (CALTRATE 600 PO) Take by mouth daily.      Historical Provider, MD  lisinopril (PRINIVIL,ZESTRIL) 40 MG tablet Take 1 tablet (40 mg total) by mouth daily. 05/31/14   Rosalita Chessman, DO  lovastatin (MEVACOR) 40 MG tablet Take 1 tablet (40 mg total) by mouth at bedtime. 06/09/14   Rosalita Chessman, DO  metoprolol tartrate (LOPRESSOR) 25 MG tablet TAKE 1 TABLET BY MOUTH TWICE DAILY 07/12/14   Alferd Apa Lowne, DO  mometasone (ASMANEX 60 METERED DOSES) 220 MCG/INH inhaler Inhale 1 puff into the lungs 2 (two) times daily. 08/24/14   [place], PA-C  Multiple Vitamins-Minerals (MULTIVITAL) tablet Take 1 tablet by mouth daily.      Historical Provider, MD  niacin 500 MG CR capsule Take 500 mg by mouth at bedtime.      Historical Provider, MD  ranitidine (ZANTAC) 150 MG capsule Take 150 mg by mouth. 1 every 12 hrs pre meals as needed for heartburn     Historical Provider, MD   BP 167/61 mmHg  Pulse 60  Temp(Src) 98 F (36.7 C) (Oral)  Resp 22  SpO2 100% Physical Exam  Constitutional: She is oriented to person, place, and time. She appears well-developed and well-nourished. No distress.  HENT:  Head: Normocephalic.  Mouth/Throat: Oropharynx is clear and moist.  Eyes: Pupils are equal, round, and reactive to light.  Neck: Neck supple.  Cardiovascular: Normal rate, regular rhythm and normal heart sounds.   Pulmonary/Chest: Effort normal. No respiratory distress. She has no wheezes. She has rales.  Rales in right lower lobe  Abdominal:  Soft. She exhibits no distension. There is no tenderness. There is no rebound and no guarding.  Musculoskeletal: She exhibits no edema or tenderness.  Feet: No palpable DP and PT pulse bilaterally, but both oscultated with bedside ultrasound  Toes are cool, but rest of foot is well perfused  Neurological: She is alert and oriented to person, place, and time.  Skin: Skin is warm and dry.  Psychiatric: She has a normal mood and affect.  Nursing note and vitals reviewed.   ED Course  Procedures (including critical care time)  DIAGNOSTIC STUDIES: Oxygen Saturation is 100% on RA, normal by my interpretation.    COORDINATION OF CARE: 8:33 PM Discussed treatment plan with pt at bedside  and pt agreed to plan.   Labs Review Labs Reviewed - No data to display  Imaging Review Ct Angio Chest Pe W/cm &/or Wo Cm  08/24/2014   CLINICAL DATA:  63 year old female with history of dyspnea for the past 4 weeks, with elevated D-dimer and tingling sensation in the right leg. Evaluate for pulmonary embolism. History of hypertension.  EXAM: CT ANGIOGRAPHY CHEST WITH CONTRAST  TECHNIQUE: Multidetector CT imaging of the chest was performed using the standard protocol during bolus administration of intravenous contrast. Multiplanar CT image reconstructions and MIPs were obtained to evaluate the vascular anatomy.  CONTRAST:  115mL OMNIPAQUE IOHEXOL 350 MG/ML SOLN  COMPARISON:  No priors.  FINDINGS: Mediastinum: No filling defects within the pulmonary arterial tree to suggest underlying pulmonary embolism. Heart size is borderline enlarged. There is no significant pericardial fluid, thickening or pericardial calcification. Multiple borderline enlarged and mildly enlarged mediastinal and bilateral hilar lymph nodes. The largest of these include 11 mm short axis lymph nodes in the hilar regions bilaterally, and low right paratracheal lymph nodes measuring up to 1 cm in short axis. Esophagus is unremarkable in  appearance.  Lungs/Pleura: Mild diffuse bronchial wall thickening with moderate centrilobular and paraseptal emphysema. 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 No confluent consolidative airspace disease. No pleural effusions. No suspicious appearing pulmonary nodules or masses are noted.  Upper Abdomen: Right renal artery stent noted. This is incompletely visualized, but the visualized portions appear potentially occluded. The right kidney is not visualized.  Musculoskeletal: There are no aggressive appearing lytic or blastic lesions noted in the visualized portions of the skeleton.  Review of the MIP images confirms the above findings.  IMPRESSION: 1. No evidence of pulmonary embolism. 2. There is a spectrum of imaging findings in the lungs compatible with a combination of emphysema and interstitial lung disease, as detailed above. Specifically, the interstitial lung changes are concerning for probable usual interstitial pneumonia (UIP). Repeat high-resolution chest CT in 12 months is suggested to further evaluate these findings and assess for temporal changes in the appearance of the lung parenchyma if clinically appropriate. In the meantime, outpatient referral to pulmonology is suggested for further evaluation. 3. Although incompletely visualized, there appears to be a stent in the right renal artery, and the visualized portions of the superior potentially occluded. Clinical correlation for signs and symptoms of renovascular hypertension is suggested.   Electronically Signed   By: Vinnie Langton M.D.   On: 08/24/2014 21:30     EKG Interpretation None      MDM   Final diagnoses:  Dyspnea  Elevated d-dimer  Intermittent  claudication    Pt is a 63 y.o. female with Pmhx as above who presents with BLLE pain w/ ambulation, SOB and elevated d-dimer from PCP office. She also had nml CXR and negative DVT studies today. Leg pain BL, though R>L and is exacerbated by walking, relieved by rest. On PE, VSS, pt in NAD. Pulses are weak in feet and found only w/ doppler. CTA with no evidence of PE< but liekly chronic lung disease. She also has possible renal artery stent occlusion, though clinically, Cr is stable and family reports no recent sigficicant change in her BPs'. Leg pain is likely claudication. Will have her to f/u closely with PCP for consideration for  referral to vascular for her suspected claudication and possible RA stent occlusion which I do not feel is an acute emergency today.     Carolann Trautman evaluation in the Emergency Department is complete. It has been determined that no acute conditions requiring further emergency intervention are present at this time. The patient/guardian have been advised of the diagnosis and plan. We have discussed signs and symptoms that warrant return to the ED, such as changes or worsening in symptoms, worsening pain, fever, inability to tolerate liquids.     I personally performed the services described in this documentation, which was scribed in my presence. The recorded information has been reviewed and is accurate.     Ernestina Patches, MD 08/26/14 [phone]

## 2014-08-24 NOTE — Assessment & Plan Note (Signed)
Rt lower ext doppler today negative. D-dimer as well. If D-dimer positive will either set up for outpatient ct of chest for evaluluation of  PE or send to ED.

## 2014-08-24 NOTE — Progress Notes (Signed)
Subjective:    Patient ID: Christine Meyer, female    DOB: Jul 05, 1951, 63 y.o.   MRN: [id number]  HPI   Pt in reporting that her legs have both legs feel a little weak. Pt has history of stents placed in her rt leg. Pt states her legs cramps at times. Not enough weakness that makes her fall.  But reports some rt calf pain and some pain behind rt knee. She points in region of popliteal fossae. No hx of dvt.  No trauma or falls.   Pt states some recent some shortness of breath when she walks. Pt has history of smoking(Pt smokes about 5-6 cigarettes). Pt  Has years of mild shortness of breath ambulating. Last week worse than her baseline sob on ambulating that she has had for years.   Pt also has some history of heart disease.Known hx of cardiac stents in addition to rt lower extreity stents.  Yesterday short of breath moving trash can and at work. No assocated chest pain. No dyspnea presently during the exam.  Past Medical History  Diagnosis Date  . Anemia     NOS  . Diabetes mellitus, type 2   . Hypertension   . Uterine cancer     History   Social History  . Marital Status: Single    Spouse Name: N/A    Number of Children: N/A  . Years of Education: N/A   Occupational History  . Not on file.   Social History Main Topics  . Smoking status: Smoker, Current Status Unknown -- 0.30 packs/day  . Smokeless tobacco: Never Used  . Alcohol Use: No  . Drug Use: Not on file  . Sexual Activity: Not on file   Other Topics Concern  . Not on file   Social History Narrative   No diet.     Past Surgical History  Procedure Laterality Date  . Hysterectomy (other)      and BSO for uterine CA  . Right renal art stent  May 2007  . Right renal art sten for restenosis  Dec 2007  . Rca stent  1999    Dr. Melvern Banker G4 P4  . Colonoscopy neg  2000    Family History  Problem Relation Age of Onset  . Heart failure Mother     after hip fx.   . Hypertension Mother   . Diabetes Mother    . Deep vein thrombosis Mother   . Hypertension Father   . Diabetes Sister     insulin dependent  . Hypertension Brother   . Emphysema Brother   . Diabetes Brother     insulin dependent  . Diabetes Maternal Aunt     non-insulent dependent  . Heart attack Paternal Aunt   . Diabetes Maternal Grandmother     non-insulin dependent    No Known Allergies  Current Outpatient Prescriptions on File Prior to Visit  Medication Sig Dispense Refill  . aspirin (BABY ASPIRIN) 81 MG chewable tablet Chew 81 mg by mouth daily.      . Calcium Carbonate (CALTRATE 600 PO) Take by mouth daily.      Marland Kitchen lisinopril (PRINIVIL,ZESTRIL) 40 MG tablet Take 1 tablet (40 mg total) by mouth daily. 90 tablet 3  . lovastatin (MEVACOR) 40 MG tablet Take 1 tablet (40 mg total) by mouth at bedtime. 30 tablet 2  . metoprolol tartrate (LOPRESSOR) 25 MG tablet TAKE 1 TABLET BY MOUTH TWICE DAILY 180 tablet 1  . Multiple Vitamins-Minerals (MULTIVITAL) tablet  Take 1 tablet by mouth daily.      . niacin 500 MG CR capsule Take 500 mg by mouth at bedtime.      . ranitidine (ZANTAC) 150 MG capsule Take 150 mg by mouth. 1 every 12 hrs pre meals as needed for heartburn      No current facility-administered medications on file prior to visit.    BP 170/80 mmHg  Pulse 82  Temp(Src) 98 F (36.7 C) (Oral)  Ht 5' 3.5" (1.613 m)  Wt 155 lb 12.8 oz (70.67 kg)  BMI 27.16 kg/m2  SpO2 100%       Review of Systems  Constitutional: Negative for fever, chills, diaphoresis, activity change and fatigue.  Respiratory: Positive for shortness of breath. Negative for cough and chest tightness.        Nonr presently but some yesterday on activity.  Cardiovascular: Negative for chest pain, palpitations and leg swelling.  Gastrointestinal: Negative for nausea, vomiting and abdominal pain.  Musculoskeletal: Negative for neck pain and neck stiffness.       Rt leg pain and some in popliteal region.  Neurological: Negative for dizziness,  tremors, seizures, syncope, facial asymmetry, speech difficulty, weakness, light-headedness, numbness and headaches.  Psychiatric/Behavioral: Negative for behavioral problems, confusion and agitation. The patient is not nervous/anxious.        Objective:   Physical Exam   General Mental Status- Alert. General Appearance- Not in acute distress.   Skin General: Color- Normal Color. Moisture- Normal Moisture.  Neck Carotid Arteries- Normal color. Moisture- Normal Moisture. No carotid bruits. No JVD.  Chest and Lung Exam Auscultation: Breath Sounds:-Normal.  Cardiovascular Auscultation:Rythm- Regular. Murmurs & Other Heart Sounds:Auscultation of the heart reveals- No Murmurs.  Abdomen Inspection:-Inspeection Normal. Palpation/Percussion:Note:No mass. Palpation and Percussion of the abdomen reveal- Non Tender, Non Distended + BS, no rebound or guarding.    Neurologic Cranial Nerve exam:- CN III-XII intact(No nystagmus), symmetric smile. Romberg Exam:- Negative.  Strength:- 5/5 equal and symmetric strength both upper and lower extremities.  Rt lower ext- mild faint rt popliteal pain/positive homans signs.  Lt lower ext- negative homans sign. Both calfs symmetric. Feet- both side pulses intact and capillary refill intact.        Assessment & Plan:  On walk in the office her pulse was 86. Pulse o2% was 99%. No sob or chest pain on walk. Pulling trash can at home last night. Then at work felt sob yesterday. Theses events both were transient.

## 2014-08-24 NOTE — Assessment & Plan Note (Addendum)
Your shortness of breath may be related to copd and therefore will get cxr as well. In addition will rx qvar and albuterol inhaler. Note also getting lower extremity US and getting d-dimer.  Ekg looked normal today in the office.

## 2014-08-24 NOTE — ED Notes (Signed)
Pt placed on cardiac monitor 

## 2014-08-24 NOTE — Telephone Encounter (Signed)
Pt returning your call back

## 2014-08-25 ENCOUNTER — Telehealth: Payer: Self-pay | Admitting: Medical

## 2014-08-25 ENCOUNTER — Telehealth: Payer: Self-pay | Admitting: Internal Medicine

## 2014-08-25 DIAGNOSIS — R0600 Dyspnea, unspecified: Secondary | ICD-10-CM

## 2014-08-25 NOTE — Telephone Encounter (Signed)
Pt was seen in ER and had CT scan- neg for PE. They are wanting her to see Pulmonary (Dr Lake Bells or Dr Lamonte Sakai) and Vascular (Dr Gwenlyn Found). Can referrals be placed. Please advise.

## 2014-08-25 NOTE — Telephone Encounter (Signed)
Regarding exact specialist to refer.

## 2014-08-25 NOTE — Telephone Encounter (Signed)
Called patient with results of CXR.

## 2014-08-25 NOTE — Telephone Encounter (Signed)
After extensive negative work up in our office and neg ct of chest for her dyspnea with acitivity will refer to both cariology and neurology.

## 2014-08-27 ENCOUNTER — Encounter: Payer: Self-pay | Admitting: Internal Medicine

## 2014-09-05 ENCOUNTER — Other Ambulatory Visit: Payer: Self-pay | Admitting: Family Medicine

## 2014-09-05 DIAGNOSIS — E785 Hyperlipidemia, unspecified: Secondary | ICD-10-CM

## 2014-09-06 ENCOUNTER — Other Ambulatory Visit (HOSPITAL_COMMUNITY)
Admission: RE | Admit: 2014-09-06 | Discharge: 2014-09-06 | Disposition: A | Discharge status: Home / Self Care | Payer: Medicare Other | Source: Ambulatory Visit | Attending: Family Medicine | Admitting: Family Medicine

## 2014-09-06 ENCOUNTER — Other Ambulatory Visit: Payer: Self-pay | Admitting: Family Medicine

## 2014-09-06 ENCOUNTER — Ambulatory Visit (INDEPENDENT_AMBULATORY_CARE_PROVIDER_SITE_OTHER): Payer: Medicare Other | Admitting: Family Medicine

## 2014-09-06 ENCOUNTER — Encounter: Payer: Self-pay | Admitting: Family Medicine

## 2014-09-06 VITALS — BP 124/58 | HR 63 | Temp 97.7°F | Ht 63.0 in | Wt 154.8 lb

## 2014-09-06 DIAGNOSIS — Z124 Encounter for screening for malignant neoplasm of cervix: Secondary | ICD-10-CM

## 2014-09-06 DIAGNOSIS — I739 Peripheral vascular disease, unspecified: Secondary | ICD-10-CM

## 2014-09-06 DIAGNOSIS — E785 Hyperlipidemia, unspecified: Secondary | ICD-10-CM

## 2014-09-06 DIAGNOSIS — I10 Essential (primary) hypertension: Secondary | ICD-10-CM

## 2014-09-06 DIAGNOSIS — Z803 Family history of malignant neoplasm of breast: Secondary | ICD-10-CM

## 2014-09-06 DIAGNOSIS — I6523 Occlusion and stenosis of bilateral carotid arteries: Secondary | ICD-10-CM

## 2014-09-06 DIAGNOSIS — E2839 Other primary ovarian failure: Secondary | ICD-10-CM

## 2014-09-06 DIAGNOSIS — Z23 Encounter for immunization: Secondary | ICD-10-CM | POA: Diagnosis not present

## 2014-09-06 DIAGNOSIS — Z1151 Encounter for screening for human papillomavirus (HPV): Secondary | ICD-10-CM | POA: Diagnosis not present

## 2014-09-06 DIAGNOSIS — Z0000 Encounter for general adult medical examination without abnormal findings: Secondary | ICD-10-CM

## 2014-09-06 DIAGNOSIS — Z1231 Encounter for screening mammogram for malignant neoplasm of breast: Secondary | ICD-10-CM

## 2014-09-06 DIAGNOSIS — Z1239 Encounter for other screening for malignant neoplasm of breast: Secondary | ICD-10-CM

## 2014-09-06 LAB — CBC WITH DIFFERENTIAL/PLATELET
Basophils Absolute: 0 10*3/uL (ref 0.0–0.1)
Basophils Relative: 0.8 % (ref 0.0–3.0)
Eosinophils Absolute: 0.2 10*3/uL (ref 0.0–0.7)
Eosinophils Relative: 3.9 % (ref 0.0–5.0)
HCT: 35.8 % — ABNORMAL LOW (ref 36.0–46.0)
Hemoglobin: 11.3 g/dL — ABNORMAL LOW (ref 12.0–15.0)
Lymphocytes Relative: 44 % (ref 12.0–46.0)
Lymphs Abs: 2.2 10*3/uL (ref 0.7–4.0)
MCHC: 31.7 g/dL (ref 30.0–36.0)
MCV: 108.6 fl — ABNORMAL HIGH (ref 78.0–100.0)
Monocytes Absolute: 0.4 10*3/uL (ref 0.1–1.0)
Monocytes Relative: 7.3 % (ref 3.0–12.0)
Neutro Abs: 2.2 10*3/uL (ref 1.4–7.7)
Neutrophils Relative %: 44 % (ref 43.0–77.0)
Platelets: 240 10*3/uL (ref 150.0–400.0)
RBC: 3.3 Mil/uL — ABNORMAL LOW (ref 3.87–5.11)
RDW: 16.9 % — ABNORMAL HIGH (ref 11.5–15.5)
WBC: 5 10*3/uL (ref 4.0–10.5)

## 2014-09-06 LAB — POCT URINALYSIS DIPSTICK
Bilirubin, UA: NEGATIVE
Blood, UA: NEGATIVE
Glucose, UA: NEGATIVE
KETONES UA: NEGATIVE
LEUKOCYTES UA: NEGATIVE
Nitrite, UA: NEGATIVE
PROTEIN UA: NEGATIVE
Spec Grav, UA: >=1.03
UROBILINOGEN UA: NEGATIVE
pH, UA: 5

## 2014-09-06 LAB — BASIC METABOLIC PANEL
BUN: 15 mg/dL (ref 6–23)
CALCIUM: 9.1 mg/dL (ref 8.4–10.5)
CO2: 27 mEq/L (ref 19–32)
Chloride: 106 mEq/L (ref 96–112)
Creatinine, Ser: 1.1 mg/dL (ref 0.4–1.2)
GFR: 64.69 mL/min (ref >60.00)
Glucose, Bld: 90 mg/dL (ref 70–99)
POTASSIUM: 4.1 meq/L (ref 3.5–5.1)
SODIUM: 139 meq/L (ref 135–145)

## 2014-09-06 LAB — LIPID PANEL
Cholesterol: 165 mg/dL (ref 0–200)
HDL: 53.6 mg/dL (ref >39.00)
LDL Cholesterol: 85 mg/dL (ref 0–99)
NONHDL: 111.4
Total CHOL/HDL Ratio: 3
Triglycerides: 133 mg/dL (ref 0.0–149.0)
VLDL: 26.6 mg/dL (ref 0.0–40.0)

## 2014-09-06 LAB — HEPATIC FUNCTION PANEL
ALT: 12 U/L (ref 0–35)
AST: 17 U/L (ref 0–37)
Albumin: 3.9 g/dL (ref 3.5–5.2)
Alkaline Phosphatase: 67 U/L (ref 39–117)
Bilirubin, Direct: 0 mg/dL (ref 0.0–0.3)
Total Bilirubin: 0.2 mg/dL (ref 0.2–1.2)
Total Protein: 7.4 g/dL (ref 6.0–8.3)

## 2014-09-06 LAB — MICROALBUMIN / CREATININE URINE RATIO
Creatinine,U: 231.1 mg/dL
Microalb Creat Ratio: 0 mg/g (ref 0.0–30.0)
Microalb, Ur: 0.1 mg/dL (ref 0.0–1.9)

## 2014-09-06 MED ORDER — LOVASTATIN 40 MG PO TABS: 40.0000 mg | ORAL_TABLET | Freq: Every day | ORAL | Status: DC

## 2014-09-06 NOTE — Patient Instructions (Signed)
Preventive Care for Adults A healthy lifestyle and preventive care can promote health and wellness. Preventive health guidelines for women include the following key practices.  A routine yearly physical is a good way to check with your health care provider about your health and preventive screening. It is a chance to share any concerns and updates on your health and to receive a thorough exam.  Visit your dentist for a routine exam and preventive care every 6 months. Brush your teeth twice a day and floss once a day. Good oral hygiene prevents tooth decay and gum disease.  The frequency of eye exams is based on your age, health, family medical history, use of contact lenses, and other factors. Follow your health care provider's recommendations for frequency of eye exams.  Eat a healthy diet. Foods like vegetables, fruits, whole grains, low-fat dairy products, and lean protein foods contain the nutrients you need without too many calories. Decrease your intake of foods high in solid fats, added sugars, and salt. Eat the right amount of calories for you.Get information about a proper diet from your health care provider, if necessary.  Regular physical exercise is one of the most important things you can do for your health. Most adults should get at least 150 minutes of moderate-intensity exercise (any activity that increases your heart rate and causes you to sweat) each week. In addition, most adults need muscle-strengthening exercises on 2 or more days a week.  Maintain a healthy weight. The body mass index (BMI) is a screening tool to identify possible weight problems. It provides an estimate of body fat based on height and weight. Your health care provider can find your BMI and can help you achieve or maintain a healthy weight.For adults 20 years and older:  A BMI below 18.5 is considered underweight.  A BMI of 18.5 to 24.9 is normal.  A BMI of 25 to 29.9 is considered overweight.  A BMI of  30 and above is considered obese.  Maintain normal blood lipids and cholesterol levels by exercising and minimizing your intake of saturated fat. Eat a balanced diet with plenty of fruit and vegetables. Blood tests for lipids and cholesterol should begin at age 76 and be repeated every 5 years. If your lipid or cholesterol levels are high, you are over 50, or you are at high risk for heart disease, you may need your cholesterol levels checked more frequently.Ongoing high lipid and cholesterol levels should be treated with medicines if diet and exercise are not working.  If you smoke, find out from your health care provider how to quit. If you do not use tobacco, do not start.  Lung cancer screening is recommended for adults aged 22-80 years who are at high risk for developing lung cancer because of a history of smoking. A yearly low-dose CT scan of the lungs is recommended for people who have at least a 30-pack-year history of smoking and are a current smoker or have quit within the past 15 years. A pack year of smoking is smoking an average of 1 pack of cigarettes a day for 1 year (for example: 1 pack a day for 30 years or 2 packs a day for 15 years). Yearly screening should continue until the smoker has stopped smoking for at least 15 years. Yearly screening should be stopped for people who develop a health problem that would prevent them from having lung cancer treatment.  If you are pregnant, do not drink alcohol. If you are breastfeeding,  be very cautious about drinking alcohol. If you are not pregnant and choose to drink alcohol, do not have more than 1 drink per day. One drink is considered to be 12 ounces (355 mL) of beer, 5 ounces (148 mL) of wine, or 1.5 ounces (44 mL) of liquor.  Avoid use of street drugs. Do not share needles with anyone. Ask for help if you need support or instructions about stopping the use of drugs.  High blood pressure causes heart disease and increases the risk of  stroke. Your blood pressure should be checked at least every 1 to 2 years. Ongoing high blood pressure should be treated with medicines if weight loss and exercise do not work.  If you are 75-52 years old, ask your health care provider if you should take aspirin to prevent strokes.  Diabetes screening involves taking a blood sample to check your fasting blood sugar level. This should be done once every 3 years, after age 15, if you are within normal weight and without risk factors for diabetes. Testing should be considered at a younger age or be carried out more frequently if you are overweight and have at least 1 risk factor for diabetes.  Breast cancer screening is essential preventive care for women. You should practice "breast self-awareness." This means understanding the normal appearance and feel of your breasts and may include breast self-examination. Any changes detected, no matter how small, should be reported to a health care provider. Women in their 58s and 30s should have a clinical breast exam (CBE) by a health care provider as part of a regular health exam every 1 to 3 years. After age 16, women should have a CBE every year. Starting at age 53, women should consider having a mammogram (breast X-ray test) every year. Women who have a family history of breast cancer should talk to their health care provider about genetic screening. Women at a high risk of breast cancer should talk to their health care providers about having an MRI and a mammogram every year.  Breast cancer gene (BRCA)-related cancer risk assessment is recommended for women who have family members with BRCA-related cancers. BRCA-related cancers include breast, ovarian, tubal, and peritoneal cancers. Having family members with these cancers may be associated with an increased risk for harmful changes (mutations) in the breast cancer genes BRCA1 and BRCA2. Results of the assessment will determine the need for genetic counseling and  BRCA1 and BRCA2 testing.  Routine pelvic exams to screen for cancer are no longer recommended for nonpregnant women who are considered low risk for cancer of the pelvic organs (ovaries, uterus, and vagina) and who do not have symptoms. Ask your health care provider if a screening pelvic exam is right for you.  If you have had past treatment for cervical cancer or a condition that could lead to cancer, you need Pap tests and screening for cancer for at least 20 years after your treatment. If Pap tests have been discontinued, your risk factors (such as having a new sexual partner) need to be reassessed to determine if screening should be resumed. Some women have medical problems that increase the chance of getting cervical cancer. In these cases, your health care provider may recommend more frequent screening and Pap tests.  The HPV test is an additional test that may be used for cervical cancer screening. The HPV test looks for the virus that can cause the cell changes on the cervix. The cells collected during the Pap test can be  tested for HPV. The HPV test could be used to screen women aged 30 years and older, and should be used in women of any age who have unclear Pap test results. After the age of 30, women should have HPV testing at the same frequency as a Pap test.  Colorectal cancer can be detected and often prevented. Most routine colorectal cancer screening begins at the age of 50 years and continues through age 75 years. However, your health care provider may recommend screening at an earlier age if you have risk factors for colon cancer. On a yearly basis, your health care provider may provide home test kits to check for hidden blood in the stool. Use of a small camera at the end of a tube, to directly examine the colon (sigmoidoscopy or colonoscopy), can detect the earliest forms of colorectal cancer. Talk to your health care provider about this at age 50, when routine screening begins. Direct  exam of the colon should be repeated every 5-10 years through age 75 years, unless early forms of pre-cancerous polyps or small growths are found.  People who are at an increased risk for hepatitis B should be screened for this virus. You are considered at high risk for hepatitis B if:  You were born in a country where hepatitis B occurs often. Talk with your health care provider about which countries are considered high risk.  Your parents were born in a high-risk country and you have not received a shot to protect against hepatitis B (hepatitis B vaccine).  You have HIV or AIDS.  You use needles to inject street drugs.  You live with, or have sex with, someone who has hepatitis B.  You get hemodialysis treatment.  You take certain medicines for conditions like cancer, organ transplantation, and autoimmune conditions.  Hepatitis C blood testing is recommended for all people born from 1945 through 1965 and any individual with known risks for hepatitis C.  Practice safe sex. Use condoms and avoid high-risk sexual practices to reduce the spread of sexually transmitted infections (STIs). STIs include gonorrhea, chlamydia, syphilis, trichomonas, herpes, HPV, and human immunodeficiency virus (HIV). Herpes, HIV, and HPV are viral illnesses that have no cure. They can result in disability, cancer, and death.  You should be screened for sexually transmitted illnesses (STIs) including gonorrhea and chlamydia if:  You are sexually active and are younger than 24 years.  You are older than 24 years and your health care provider tells you that you are at risk for this type of infection.  Your sexual activity has changed since you were last screened and you are at an increased risk for chlamydia or gonorrhea. Ask your health care provider if you are at risk.  If you are at risk of being infected with HIV, it is recommended that you take a prescription medicine daily to prevent HIV infection. This is  called preexposure prophylaxis (PrEP). You are considered at risk if:  You are a heterosexual woman, are sexually active, and are at increased risk for HIV infection.  You take drugs by injection.  You are sexually active with a partner who has HIV.  Talk with your health care provider about whether you are at high risk of being infected with HIV. If you choose to begin PrEP, you should first be tested for HIV. You should then be tested every 3 months for as long as you are taking PrEP.  Osteoporosis is a disease in which the bones lose minerals and strength   with aging. This can result in serious bone fractures or breaks. The risk of osteoporosis can be identified using a bone density scan. Women ages 65 years and over and women at risk for fractures or osteoporosis should discuss screening with their health care providers. Ask your health care provider whether you should take a calcium supplement or vitamin D to reduce the rate of osteoporosis.  Menopause can be associated with physical symptoms and risks. Hormone replacement therapy is available to decrease symptoms and risks. You should talk to your health care provider about whether hormone replacement therapy is right for you.  Use sunscreen. Apply sunscreen liberally and repeatedly throughout the day. You should seek shade when your shadow is shorter than you. Protect yourself by wearing long sleeves, pants, a wide-brimmed hat, and sunglasses year round, whenever you are outdoors.  Once a month, do a whole body skin exam, using a mirror to look at the skin on your back. Tell your health care provider of new moles, moles that have irregular borders, moles that are larger than a pencil eraser, or moles that have changed in shape or color.  Stay current with required vaccines (immunizations).  Influenza vaccine. All adults should be immunized every year.  Tetanus, diphtheria, and acellular pertussis (Td, Tdap) vaccine. Pregnant women should  receive 1 dose of Tdap vaccine during each pregnancy. The dose should be obtained regardless of the length of time since the last dose. Immunization is preferred during the 27th-36th week of gestation. An adult who has not previously received Tdap or who does not know her vaccine status should receive 1 dose of Tdap. This initial dose should be followed by tetanus and diphtheria toxoids (Td) booster doses every 10 years. Adults with an unknown or incomplete history of completing a 3-dose immunization series with Td-containing vaccines should begin or complete a primary immunization series including a Tdap dose. Adults should receive a Td booster every 10 years.  Varicella vaccine. An adult without evidence of immunity to varicella should receive 2 doses or a second dose if she has previously received 1 dose. Pregnant females who do not have evidence of immunity should receive the first dose after pregnancy. This first dose should be obtained before leaving the health care facility. The second dose should be obtained 4-8 weeks after the first dose.  Human papillomavirus (HPV) vaccine. Females aged 13-26 years who have not received the vaccine previously should obtain the 3-dose series. The vaccine is not recommended for use in pregnant females. However, pregnancy testing is not needed before receiving a dose. If a female is found to be pregnant after receiving a dose, no treatment is needed. In that case, the remaining doses should be delayed until after the pregnancy. Immunization is recommended for any person with an immunocompromised condition through the age of 26 years if she did not get any or all doses earlier. During the 3-dose series, the second dose should be obtained 4-8 weeks after the first dose. The third dose should be obtained 24 weeks after the first dose and 16 weeks after the second dose.  Zoster vaccine. One dose is recommended for adults aged 60 years or older unless certain conditions are  present.  Measles, mumps, and rubella (MMR) vaccine. Adults born before 1957 generally are considered immune to measles and mumps. Adults born in 1957 or later should have 1 or more doses of MMR vaccine unless there is a contraindication to the vaccine or there is laboratory evidence of immunity to   each of the three diseases. A routine second dose of MMR vaccine should be obtained at least 28 days after the first dose for students attending postsecondary schools, health care workers, or international travelers. People who received inactivated measles vaccine or an unknown type of measles vaccine during 1963-1967 should receive 2 doses of MMR vaccine. People who received inactivated mumps vaccine or an unknown type of mumps vaccine before 1979 and are at high risk for mumps infection should consider immunization with 2 doses of MMR vaccine. For females of childbearing age, rubella immunity should be determined. If there is no evidence of immunity, females who are not pregnant should be vaccinated. If there is no evidence of immunity, females who are pregnant should delay immunization until after pregnancy. Unvaccinated health care workers born before 1957 who lack laboratory evidence of measles, mumps, or rubella immunity or laboratory confirmation of disease should consider measles and mumps immunization with 2 doses of MMR vaccine or rubella immunization with 1 dose of MMR vaccine.  Pneumococcal 13-valent conjugate (PCV13) vaccine. When indicated, a person who is uncertain of her immunization history and has no record of immunization should receive the PCV13 vaccine. An adult aged 19 years or older who has certain medical conditions and has not been previously immunized should receive 1 dose of PCV13 vaccine. This PCV13 should be followed with a dose of pneumococcal polysaccharide (PPSV23) vaccine. The PPSV23 vaccine dose should be obtained at least 8 weeks after the dose of PCV13 vaccine. An adult aged 19  years or older who has certain medical conditions and previously received 1 or more doses of PPSV23 vaccine should receive 1 dose of PCV13. The PCV13 vaccine dose should be obtained 1 or more years after the last PPSV23 vaccine dose.  Pneumococcal polysaccharide (PPSV23) vaccine. When PCV13 is also indicated, PCV13 should be obtained first. All adults aged 65 years and older should be immunized. An adult younger than age 65 years who has certain medical conditions should be immunized. Any person who resides in a nursing home or long-term care facility should be immunized. An adult smoker should be immunized. People with an immunocompromised condition and certain other conditions should receive both PCV13 and PPSV23 vaccines. People with human immunodeficiency virus (HIV) infection should be immunized as soon as possible after diagnosis. Immunization during chemotherapy or radiation therapy should be avoided. Routine use of PPSV23 vaccine is not recommended for American Indians, [place] Natives, or people younger than 65 years unless there are medical conditions that require PPSV23 vaccine. When indicated, people who have unknown immunization and have no record of immunization should receive PPSV23 vaccine. One-time revaccination 5 years after the first dose of PPSV23 is recommended for people aged 19-64 years who have chronic kidney failure, nephrotic syndrome, asplenia, or immunocompromised conditions. People who received 1-2 doses of PPSV23 before age 65 years should receive another dose of PPSV23 vaccine at age 65 years or later if at least 5 years have passed since the previous dose. Doses of PPSV23 are not needed for people immunized with PPSV23 at or after age 65 years.  Meningococcal vaccine. Adults with asplenia or persistent complement component deficiencies should receive 2 doses of quadrivalent meningococcal conjugate (MenACWY-D) vaccine. The doses should be obtained at least 2 months apart.  Microbiologists working with certain meningococcal bacteria, military recruits, people at risk during an outbreak, and people who travel to or live in countries with a high rate of meningitis should be immunized. A first-year college student up through age   21 years who is living in a residence hall should receive a dose if she did not receive a dose on or after her 16th birthday. Adults who have certain high-risk conditions should receive one or more doses of vaccine.  Hepatitis A vaccine. Adults who wish to be protected from this disease, have certain high-risk conditions, work with hepatitis A-infected animals, work in hepatitis A research labs, or travel to or work in countries with a high rate of hepatitis A should be immunized. Adults who were previously unvaccinated and who anticipate close contact with an international adoptee during the first 60 days after arrival in the [place] from a country with a high rate of hepatitis A should be immunized.  Hepatitis B vaccine. Adults who wish to be protected from this disease, have certain high-risk conditions, may be exposed to blood or other infectious body fluids, are household contacts or sex partners of hepatitis B positive people, are clients or workers in certain care facilities, or travel to or work in countries with a high rate of hepatitis B should be immunized.  Haemophilus influenzae type b (Hib) vaccine. A previously unvaccinated person with asplenia or sickle cell disease or having a scheduled splenectomy should receive 1 dose of Hib vaccine. Regardless of previous immunization, a recipient of a hematopoietic stem cell transplant should receive a 3-dose series 6-12 months after her successful transplant. Hib vaccine is not recommended for adults with HIV infection. Preventive Services / Frequency Ages 64 to 68 years  Blood pressure check.** / Every 1 to 2 years.  Lipid and cholesterol check.** / Every 5 years beginning at age  22.  Clinical breast exam.** / Every 3 years for women in their 88s and 53s.  BRCA-related cancer risk assessment.** / For women who have family members with a BRCA-related cancer (breast, ovarian, tubal, or peritoneal cancers).  Pap test.** / Every 2 years from ages 90 through 51. Every 3 years starting at age 21 through age 56 or 3 with a history of 3 consecutive normal Pap tests.  HPV screening.** / Every 3 years from ages 24 through ages 1 to 46 with a history of 3 consecutive normal Pap tests.  Hepatitis C blood test.** / For any individual with known risks for hepatitis C.  Skin self-exam. / Monthly.  Influenza vaccine. / Every year.  Tetanus, diphtheria, and acellular pertussis (Tdap, Td) vaccine.** / Consult your health care provider. Pregnant women should receive 1 dose of Tdap vaccine during each pregnancy. 1 dose of Td every 10 years.  Varicella vaccine.** / Consult your health care provider. Pregnant females who do not have evidence of immunity should receive the first dose after pregnancy.  HPV vaccine. / 3 doses over 6 months, if 72 and younger. The vaccine is not recommended for use in pregnant females. However, pregnancy testing is not needed before receiving a dose.  Measles, mumps, rubella (MMR) vaccine.** / You need at least 1 dose of MMR if you were born in 1957 or later. You may also need a 2nd dose. For females of childbearing age, rubella immunity should be determined. If there is no evidence of immunity, females who are not pregnant should be vaccinated. If there is no evidence of immunity, females who are pregnant should delay immunization until after pregnancy.  Pneumococcal 13-valent conjugate (PCV13) vaccine.** / Consult your health care provider.  Pneumococcal polysaccharide (PPSV23) vaccine.** / 1 to 2 doses if you smoke cigarettes or if you have certain conditions.  Meningococcal vaccine.** /  1 dose if you are age 19 to 21 years and a first-year college  student living in a residence hall, or have one of several medical conditions, you need to get vaccinated against meningococcal disease. You may also need additional booster doses.  Hepatitis A vaccine.** / Consult your health care provider.  Hepatitis B vaccine.** / Consult your health care provider.  Haemophilus influenzae type b (Hib) vaccine.** / Consult your health care provider. Ages 40 to 64 years  Blood pressure check.** / Every 1 to 2 years.  Lipid and cholesterol check.** / Every 5 years beginning at age 20 years.  Lung cancer screening. / Every year if you are aged 55-80 years and have a 30-pack-year history of smoking and currently smoke or have quit within the past 15 years. Yearly screening is stopped once you have quit smoking for at least 15 years or develop a health problem that would prevent you from having lung cancer treatment.  Clinical breast exam.** / Every year after age 40 years.  BRCA-related cancer risk assessment.** / For women who have family members with a BRCA-related cancer (breast, ovarian, tubal, or peritoneal cancers).  Mammogram.** / Every year beginning at age 40 years and continuing for as long as you are in good health. Consult with your health care provider.  Pap test.** / Every 3 years starting at age 30 years through age 65 or 70 years with a history of 3 consecutive normal Pap tests.  HPV screening.** / Every 3 years from ages 30 years through ages 65 to 70 years with a history of 3 consecutive normal Pap tests.  Fecal occult blood test (FOBT) of stool. / Every year beginning at age 50 years and continuing until age 75 years. You may not need to do this test if you get a colonoscopy every 10 years.  Flexible sigmoidoscopy or colonoscopy.** / Every 5 years for a flexible sigmoidoscopy or every 10 years for a colonoscopy beginning at age 50 years and continuing until age 75 years.  Hepatitis C blood test.** / For all people born from 1945 through  1965 and any individual with known risks for hepatitis C.  Skin self-exam. / Monthly.  Influenza vaccine. / Every year.  Tetanus, diphtheria, and acellular pertussis (Tdap/Td) vaccine.** / Consult your health care provider. Pregnant women should receive 1 dose of Tdap vaccine during each pregnancy. 1 dose of Td every 10 years.  Varicella vaccine.** / Consult your health care provider. Pregnant females who do not have evidence of immunity should receive the first dose after pregnancy.  Zoster vaccine.** / 1 dose for adults aged 60 years or older.  Measles, mumps, rubella (MMR) vaccine.** / You need at least 1 dose of MMR if you were born in 1957 or later. You may also need a 2nd dose. For females of childbearing age, rubella immunity should be determined. If there is no evidence of immunity, females who are not pregnant should be vaccinated. If there is no evidence of immunity, females who are pregnant should delay immunization until after pregnancy.  Pneumococcal 13-valent conjugate (PCV13) vaccine.** / Consult your health care provider.  Pneumococcal polysaccharide (PPSV23) vaccine.** / 1 to 2 doses if you smoke cigarettes or if you have certain conditions.  Meningococcal vaccine.** / Consult your health care provider.  Hepatitis A vaccine.** / Consult your health care provider.  Hepatitis B vaccine.** / Consult your health care provider.  Haemophilus influenzae type b (Hib) vaccine.** / Consult your health care provider. Ages 65   years and over  Blood pressure check.** / Every 1 to 2 years.  Lipid and cholesterol check.** / Every 5 years beginning at age 22 years.  Lung cancer screening. / Every year if you are aged 73-80 years and have a 30-pack-year history of smoking and currently smoke or have quit within the past 15 years. Yearly screening is stopped once you have quit smoking for at least 15 years or develop a health problem that would prevent you from having lung cancer  treatment.  Clinical breast exam.** / Every year after age 4 years.  BRCA-related cancer risk assessment.** / For women who have family members with a BRCA-related cancer (breast, ovarian, tubal, or peritoneal cancers).  Mammogram.** / Every year beginning at age 40 years and continuing for as long as you are in good health. Consult with your health care provider.  Pap test.** / Every 3 years starting at age 9 years through age 34 or 91 years with 3 consecutive normal Pap tests. Testing can be stopped between 65 and 70 years with 3 consecutive normal Pap tests and no abnormal Pap or HPV tests in the past 10 years.  HPV screening.** / Every 3 years from ages 57 years through ages 64 or 45 years with a history of 3 consecutive normal Pap tests. Testing can be stopped between 65 and 70 years with 3 consecutive normal Pap tests and no abnormal Pap or HPV tests in the past 10 years.  Fecal occult blood test (FOBT) of stool. / Every year beginning at age 15 years and continuing until age 17 years. You may not need to do this test if you get a colonoscopy every 10 years.  Flexible sigmoidoscopy or colonoscopy.** / Every 5 years for a flexible sigmoidoscopy or every 10 years for a colonoscopy beginning at age 86 years and continuing until age 71 years.  Hepatitis C blood test.** / For all people born from [birth year] through 1967 and any individual with known risks for hepatitis C.  Osteoporosis screening.** / A one-time screening for women ages 83 years and over and women at risk for fractures or osteoporosis.  Skin self-exam. / Monthly.  Influenza vaccine. / Every year.  Tetanus, diphtheria, and acellular pertussis (Tdap/Td) vaccine.** / 1 dose of Td every 10 years.  Varicella vaccine.** / Consult your health care provider.  Zoster vaccine.** / 1 dose for adults aged 61 years or older.  Pneumococcal 13-valent conjugate (PCV13) vaccine.** / Consult your health care provider.  Pneumococcal  polysaccharide (PPSV23) vaccine.** / 1 dose for all adults aged 28 years and older.  Meningococcal vaccine.** / Consult your health care provider.  Hepatitis A vaccine.** / Consult your health care provider.  Hepatitis B vaccine.** / Consult your health care provider.  Haemophilus influenzae type b (Hib) vaccine.** / Consult your health care provider. ** Family history and personal history of risk and conditions may change your health care provider's recommendations. Document Released: 10/23/2001 Document Revised: 01/11/2014 Document Reviewed: 01/22/2011 [facility] Patient Information 2015 [place], [place]. This information is not intended to replace advice given to you by your health care provider. Make sure you discuss any questions you have with your health care provider.

## 2014-09-06 NOTE — Progress Notes (Signed)
Pre visit review using our clinic review tool, if applicable. No additional management support is needed unless otherwise documented below in the visit note. 

## 2014-09-06 NOTE — Progress Notes (Signed)
Subjective:    Christine Meyer is a 63 y.o. female who presents for a welcome to Medicare exam and f/u dm,. Htn, and hyperlipidemia.  Pt has been a diet controlled dm for over a year.  Pt is not checking her BS at home.  She is taking her lovastatin and lisinopril.  No complaints.  She still c/o pain in R low leg with walking especially.  No sob or cp.  See visit with PA --edward.    Cardiac risk factors: advanced age (older than 69 for men, 69 for women), dyslipidemia, hypertension and sedentary lifestyle.  Activities of Daily Living  In your present state of health, do you have any difficulty performing the following activities?:  Preparing food and eating?: No Bathing yourself: No Getting dressed: No Using the toilet:No Moving around from place to place: No In the past year have you fallen or had a near fall?:No  Current exercise habits: The patient does not participate in regular exercise at present.   Dietary issues discussed: na   Depression Screen (Note: if answer to either of the following is "Yes", then a more complete depression screening is indicated)  Q1: Over the past two weeks, have you felt down, depressed or hopeless?no Q2: Over the past two weeks, have you felt little interest or pleasure in doing things? no   The following portions of the patient's history were reviewed and updated as appropriate:  She  has a past medical history of Anemia; Diabetes mellitus, type 2; Hypertension; Uterine cancer; and Hyperlipidemia. She  does not have any pertinent problems on file. She  has past surgical history that includes hysterectomy (other); right renal art stent (May 2007); right renal art sten for restenosis (Dec 2007); RCA stent (1999); and Colonoscopy neg (2000). Her family history includes Deep vein thrombosis in her mother; Diabetes in her brother, maternal aunt, maternal grandmother, mother, and sister; Emphysema in her brother; Heart attack in her paternal aunt; Heart  failure in her mother; Hypertension in her brother, father, and mother. She  reports that she has been smoking.  She has never used smokeless tobacco. She reports that she does not drink alcohol. Her drug history is not on file. She has a current medication list which includes the following prescription(s): albuterol, aspirin, calcium carbonate, lisinopril, lovastatin, metoprolol tartrate, mometasone, multivital, niacin, ranitidine, and amlodipine. Current Outpatient Prescriptions on File Prior to Visit  Medication Sig Dispense Refill  . albuterol (PROVENTIL HFA;VENTOLIN HFA) 108 (90 BASE) MCG/ACT inhaler Inhale 2 puffs into the lungs every 6 (six) hours as needed for wheezing or shortness of breath. 1 Inhaler 0  . aspirin (BABY ASPIRIN) 81 MG chewable tablet Chew 81 mg by mouth daily.      . Calcium Carbonate (CALTRATE 600 PO) Take by mouth daily.      Marland Kitchen lisinopril (PRINIVIL,ZESTRIL) 40 MG tablet Take 1 tablet (40 mg total) by mouth daily. 90 tablet 3  . lovastatin (MEVACOR) 40 MG tablet Take 1 tablet (40 mg total) by mouth at bedtime. 30 tablet 2  . metoprolol tartrate (LOPRESSOR) 25 MG tablet TAKE 1 TABLET BY MOUTH TWICE DAILY 180 tablet 1  . mometasone (ASMANEX 60 METERED DOSES) 220 MCG/INH inhaler Inhale 1 puff into the lungs 2 (two) times daily. 1 Inhaler 0  . Multiple Vitamins-Minerals (MULTIVITAL) tablet Take 1 tablet by mouth daily.      . niacin 500 MG CR capsule Take 500 mg by mouth at bedtime.      . ranitidine (ZANTAC)  150 MG capsule Take 150 mg by mouth. 1 every 12 hrs pre meals as needed for heartburn     . amLODipine (NORVASC) 5 MG tablet Take 1 tablet (5 mg total) by mouth daily. (Patient not taking: Reported on 09/06/2014) 30 tablet 1   No current facility-administered medications on file prior to visit.   She has No Known Allergies.. Review of Systems  Review of Systems  Constitutional: Negative for activity change, appetite change and fatigue.  HENT: Negative for hearing  loss, congestion, tinnitus and ear discharge.   Eyes: Negative for visual disturbance (see optho -- due) Respiratory: Negative for cough, chest tightness and shortness of breath.   Cardiovascular: Negative for chest pain, palpitations and leg swelling.  Gastrointestinal: Negative for abdominal pain, diarrhea, constipation and abdominal distention.  Genitourinary: Negative for urgency, frequency, decreased urine volume and difficulty urinating.  Musculoskeletal: Negative for back pain, arthralgias and gait problem.  Skin: Negative for color change, pallor and rash.  Neurological: Negative for dizziness, light-headedness, numbness and headaches.  Hematological: Negative for adenopathy. Does not bruise/bleed easily.  Psychiatric/Behavioral: Negative for suicidal ideas, confusion, sleep disturbance, self-injury, dysphoric mood, decreased concentration and agitation.  Pt is able to read and write and can do all ADLs No risk for falling No abuse/ violence in home   \   Objective:     Vision by Snellen chart: opth Blood pressure 124/58, pulse 63, temperature 97.7 F (36.5 C), temperature source Oral, height 5\' 3"  (1.6 m), weight 154 lb 12.8 oz (70.217 kg), SpO2 97 %. Body mass index is 27.43 kg/(m^2). BP 124/58 mmHg  Pulse 63  Temp(Src) 97.7 F (36.5 C) (Oral)  Ht 5\' 3"  (1.6 m)  Wt 154 lb 12.8 oz (70.217 kg)  BMI 27.43 kg/m2  SpO2 97% General appearance: alert, cooperative, appears stated age and no distress Head: Normocephalic, without obvious abnormality, atraumatic Eyes: conjunctivae/corneas clear. PERRL, EOM's intact. Fundi benign. Ears: normal TM's and external ear canals both ears Nose: Nares normal. Septum midline. Mucosa normal. No drainage or sinus tenderness. Throat: lips, mucosa, and tongue normal; teeth and gums normal Neck: no adenopathy, no carotid bruit, no JVD, supple, symmetrical, trachea midline and thyroid not enlarged, symmetric, no tenderness/mass/nodules Back:  symmetric, no curvature. ROM normal. No CVA tenderness. Lungs: clear to auscultation bilaterally Breasts: normal appearance, no masses or tenderness Heart: S1, S2 normal Abdomen: soft, non-tender; bowel sounds normal; no masses,  no organomegaly Pelvic: cervix normal in appearance, external genitalia normal, no adnexal masses or tenderness, no cervical motion tenderness, rectovaginal septum normal, uterus normal size, shape, and consistency, vagina normal without discharge and pap done and heme neg brown stool Extremities: extremities normal, atraumatic, no cyanosis or edema Pulses: 2+ and symmetric Skin: Skin color, texture, turgor normal. No rashes or lesions Lymph nodes: Cervical, supraclavicular, and axillary nodes normal. Neurologic: Alert and oriented X 3, normal strength and tone. Normal symmetric reflexes. Normal coordination and gait Psych- no depression, no anxiety      Assessment:    cpe     Plan:     During the course of the visit the patient was educated and counseled about appropriate screening and preventive services including:   Pneumococcal vaccine   Screening electrocardiogram  Screening mammography  Screening Pap smear and pelvic exam   Bone densitometry screening  Diabetes screening  Glaucoma screening  Smoking cessation counseling  Advanced directives: has NO advanced directive - not interested in additional information Pt refused flu shot Patient Instructions (the written plan)  was given to the patient.    1. Claudication in peripheral vascular disease  - Lower Extremity Arterial Doppler Right; Future  2. Essential hypertension Stable, con't lisinopril , metoprolol, norvasc - Basic metabolic panel - CBC with Differential - Hepatic function panel - Lipid panel - Microalbumin / creatinine urine ratio - POCT urinalysis dipstick  3. Hyperlipidemia LDL goal <100 con't lovastatin - Basic metabolic panel - CBC with Differential - Hepatic  function panel - Lipid panel - Microalbumin / creatinine urine ratio - POCT urinalysis dipstick  4. Welcome to Medicare preventive visit    5. Breast cancer screening    6. Estrogen deficiency   - DG Bone Density; Future  7. Special screening examination for neoplasm of breast   - MM DIGITAL SCREENING BILATERAL; Future  8. Family history of breast cancer    9. Encounter for screening mammogram for breast cancer   - MM DIGITAL SCREENING BILATERAL; Future  10. Need for prophylactic vaccination against Streptococcus pneumoniae (pneumococcus)   - Pneumococcal conjugate vaccine 13-valent  11. Pap smear for cervical cancer screening   - Cytology - PAP

## 2014-09-07 ENCOUNTER — Telehealth: Payer: Self-pay | Admitting: Internal Medicine

## 2014-09-07 ENCOUNTER — Ambulatory Visit (HOSPITAL_COMMUNITY): Payer: Medicare Other

## 2014-09-07 LAB — CYTOLOGY - PAP

## 2014-09-07 NOTE — Telephone Encounter (Signed)
emmi emailed °

## 2014-09-08 ENCOUNTER — Ambulatory Visit (HOSPITAL_COMMUNITY): Payer: Medicare Other

## 2014-09-09 ENCOUNTER — Other Ambulatory Visit (HOSPITAL_COMMUNITY): Payer: Self-pay | Admitting: Cardiology

## 2014-09-09 ENCOUNTER — Ambulatory Visit (HOSPITAL_COMMUNITY): Payer: Medicare Other | Attending: Cardiovascular Disease | Admitting: Cardiology

## 2014-09-09 DIAGNOSIS — I70219 Atherosclerosis of native arteries of extremities with intermittent claudication, unspecified extremity: Secondary | ICD-10-CM | POA: Diagnosis not present

## 2014-09-09 DIAGNOSIS — I739 Peripheral vascular disease, unspecified: Secondary | ICD-10-CM

## 2014-09-09 NOTE — Progress Notes (Signed)
Lower arterial Doppler and bilateral Duplex performed  

## 2014-09-13 ENCOUNTER — Other Ambulatory Visit: Payer: Self-pay | Admitting: Family Medicine

## 2014-09-13 DIAGNOSIS — I739 Peripheral vascular disease, unspecified: Secondary | ICD-10-CM

## 2014-09-16 ENCOUNTER — Ambulatory Visit: Payer: Medicare Other | Admitting: Cardiovascular Disease

## 2014-09-20 ENCOUNTER — Ambulatory Visit (INDEPENDENT_AMBULATORY_CARE_PROVIDER_SITE_OTHER)
Admission: RE | Admit: 2014-09-20 | Discharge: 2014-09-20 | Disposition: A | Discharge status: Home / Self Care | Payer: Medicare Other | Source: Ambulatory Visit | Attending: Internal Medicine | Admitting: Internal Medicine

## 2014-09-20 DIAGNOSIS — E2839 Other primary ovarian failure: Secondary | ICD-10-CM

## 2014-09-22 ENCOUNTER — Telehealth: Payer: Self-pay | Admitting: Internal Medicine

## 2014-09-22 NOTE — Telephone Encounter (Signed)
Pt returning your call regarding results. Best # [phone] (mobile) or home #

## 2014-09-24 ENCOUNTER — Ambulatory Visit (HOSPITAL_BASED_OUTPATIENT_CLINIC_OR_DEPARTMENT_OTHER)
Admission: RE | Admit: 2014-09-24 | Discharge: 2014-09-24 | Disposition: A | Discharge status: Home / Self Care | Payer: Medicare Other | Source: Ambulatory Visit | Attending: Family Medicine | Admitting: Family Medicine

## 2014-09-24 DIAGNOSIS — Z1231 Encounter for screening mammogram for malignant neoplasm of breast: Secondary | ICD-10-CM

## 2014-09-24 DIAGNOSIS — Z1239 Encounter for other screening for malignant neoplasm of breast: Secondary | ICD-10-CM

## 2014-10-06 ENCOUNTER — Telehealth: Payer: Self-pay | Admitting: Family Medicine

## 2014-10-06 NOTE — Telephone Encounter (Signed)
She can use Mucinex or mucinex DM otc , antihistamine--- if she has been sick >7 -10 and feels she needs and antibiotic she would need to be seen.

## 2014-10-06 NOTE — Telephone Encounter (Signed)
Caller name:Lucy Deatley Relationship to patient: Can be reached:[phone] Pharmacy:[facility]  Reason for call:has a deep cough/coughing up greenish mucus. No fever. Can something be called in. Please advise

## 2014-10-06 NOTE — Telephone Encounter (Signed)
Please advise if something can be called in or if the patient would need to be seen.      KP

## 2014-10-07 ENCOUNTER — Institutional Professional Consult (permissible substitution): Payer: Medicare Other | Admitting: Pulmonary Disease

## 2014-10-07 MED ORDER — DM-GUAIFENESIN ER 30-600 MG PO TB12: 1.0000 | ORAL_TABLET | Freq: Two times a day (BID) | ORAL | Status: DC

## 2014-10-07 MED ORDER — LORATADINE 10 MG PO TABS: 10.0000 mg | ORAL_TABLET | Freq: Every day | ORAL | Status: DC

## 2014-10-07 MED ORDER — DEXTROMETHORPHAN POLISTIREX 30 MG/5ML PO LQCR: 30.0000 mg | ORAL | Status: DC | PRN

## 2014-10-07 NOTE — Telephone Encounter (Signed)
yes

## 2014-10-07 NOTE — Telephone Encounter (Signed)
Spoke with daughter and she needs to know which antihistamine and will it be ok to delsym? Please advise     KP

## 2014-10-07 NOTE — Telephone Encounter (Signed)
All med's have been sent.       KP

## 2014-10-07 NOTE — Telephone Encounter (Signed)
Is is ok to take the delsym?

## 2014-10-07 NOTE — Telephone Encounter (Signed)
Any one is ok-- claritin, allegrea , zyrtec or store [place]

## 2014-10-12 ENCOUNTER — Encounter: Payer: Self-pay | Admitting: Cardiology

## 2014-10-12 ENCOUNTER — Ambulatory Visit (INDEPENDENT_AMBULATORY_CARE_PROVIDER_SITE_OTHER): Payer: Medicare Other | Admitting: Cardiology

## 2014-10-12 VITALS — BP 150/70 | HR 80 | Ht 63.0 in | Wt 155.9 lb

## 2014-10-12 DIAGNOSIS — R0602 Shortness of breath: Secondary | ICD-10-CM

## 2014-10-12 DIAGNOSIS — I25110 Atherosclerotic heart disease of native coronary artery with unstable angina pectoris: Secondary | ICD-10-CM

## 2014-10-12 DIAGNOSIS — I10 Essential (primary) hypertension: Secondary | ICD-10-CM

## 2014-10-12 NOTE — Progress Notes (Signed)
Cardiology Office Note   Date:  10/12/2014   ID:  Christine Meyer, Christine Meyer [birth year], MRN [id number]  PCP:  Garnet Koyanagi, DO  Cardiologist:   Minus Breeding, MD   Chief Complaint  Patient presents with  . Coronary Artery Disease      History of Present Illness: Christine Meyer is a 64 y.o. female who presents for follow up of CAD and dyspnea.  I saw her about 4 years ago. She has a history of coronary disease with distant stenting to her right coronary artery. She's had peripheral vascular disease with stenting of her renal artery. She is being followed actively for carotid disease. Recently she's been having some increasing leg pain. Did have lower extremity Dopplers with results described elsewhere. She's been having pain with walking is fairly reproducible and is due to see Dr. Gwenlyn Found.  She has also been getting dyspnea with exertion. This has been Slowly progressive. She's not describing resting shortness of breath. She's not describing PND or orthopnea. She doesn't have any palpitations, presyncope or syncope. She's never had any chest pressure, neck or arm discomfort. She's had no weight gain or edema. She unfortunately continues to smoke cigarettes.  Of note she did recently have an elevated d-dimer. She had a chest CT that was negative.  She had arterial Dopplers with greater then 50% right SFA and greater than 50% left CFA stenosis.     Past Medical History  Diagnosis Date  . Anemia     NOS  . Diabetes mellitus, type 2     Diet controlled  . Hypertension   . Uterine cancer   . Hyperlipidemia   . CAD (coronary artery disease)     Past Surgical History  Procedure Laterality Date  . Abdominal hysterectomy      and BSO for uterine CA  . Right renal art stent  May 2007  . Right renal art sten for restenosis  Dec 2007  . Rca stent  1999    Dr. Melvern Banker G4 P4  . Colonoscopy neg  2000     Current Outpatient Prescriptions  Medication Sig Dispense Refill  . albuterol  (PROVENTIL HFA;VENTOLIN HFA) 108 (90 BASE) MCG/ACT inhaler Inhale 2 puffs into the lungs every 6 (six) hours as needed for wheezing or shortness of breath. (Patient taking differently: Inhale 2 puffs into the lungs as needed for wheezing or shortness of breath. ) 1 Inhaler 0  . aspirin (BABY ASPIRIN) 81 MG chewable tablet Chew 81 mg by mouth daily. 3 tablets daily    . Calcium Carbonate (CALTRATE 600 PO) Take by mouth daily.      Marland Kitchen dextromethorphan (DELSYM) 30 MG/5ML liquid Take 5 mLs (30 mg total) by mouth as needed for cough. 89 mL 1  . dextromethorphan-guaiFENesin (MUCINEX DM) 30-600 MG per 12 hr tablet Take 1 tablet by mouth 2 (two) times daily. 60 tablet 1  . lisinopril (PRINIVIL,ZESTRIL) 40 MG tablet Take 1 tablet (40 mg total) by mouth daily. 90 tablet 3  . loratadine (CLARITIN) 10 MG tablet Take 1 tablet (10 mg total) by mouth daily. 30 tablet 11  . lovastatin (MEVACOR) 40 MG tablet Take 1 tablet (40 mg total) by mouth at bedtime. 30 tablet 2  . metoprolol tartrate (LOPRESSOR) 25 MG tablet TAKE 1 TABLET BY MOUTH TWICE DAILY 60 tablet 5  . mometasone (ASMANEX 60 METERED DOSES) 220 MCG/INH inhaler Inhale 1 puff into the lungs 2 (two) times daily. (Patient taking differently: Inhale 1 puff into the lungs  as needed. ) 1 Inhaler 0  . Multiple Vitamins-Minerals (MULTIVITAL) tablet Take 1 tablet by mouth daily.      . niacin 500 MG CR capsule Take 500 mg by mouth at bedtime.      . ranitidine (ZANTAC) 150 MG capsule Take 150 mg by mouth. 1 every 12 hrs pre meals as needed for heartburn      No current facility-administered medications for this visit.    Allergies:   Review of patient's allergies indicates no known allergies.    Social History:  The patient  reports that she has been smoking.  She has never used smokeless tobacco. She reports that she does not drink alcohol.   Family History:  The patient's family history includes Deep vein thrombosis in her mother; Diabetes in her brother,  maternal aunt, maternal grandmother, mother, and sister; Emphysema in her brother; Heart attack in her paternal aunt; Heart failure in her mother; Hypertension in her brother, father, and mother.    ROS:  Please see the history of present illness.   Otherwise, review of systems are positive for none.   All other systems are reviewed and negative.    PHYSICAL EXAM: VS:  BP 150/70 mmHg  Pulse 80  Ht 5\' 3"  (1.6 m)  Wt 155 lb 14.4 oz (70.716 kg)  BMI 27.62 kg/m2 , BMI Body mass index is 27.62 kg/(m^2). GEN: Well nourished, well developed, in no acute distress HEENT: normal Neck: no JVD, carotid bruits, or masses Cardiac: RRR; no murmurs, rubs, or gallops,no edema  Respiratory:  clear to auscultation bilaterally, normal work of breathing GI: soft, nontender, nondistended, + BS MS: no deformity or atrophy Skin: warm and dry, no rash Neuro:  Strength and sensation are intact Psych: euthymic mood, full affect Ext:  No edema, 2+ upper pulses, absent dorsalis pedis and posterior tibialis bilaterally   EKG:  EKG is not ordered today. EKG from 08/24/14 was reviewed by me today. Sinus rhythm, rate 81, axis within normal limits, intervals within normal limits, no acute ST-T wave changes.  Recent Labs: 09/06/2014: ALT 12; BUN 15; Creatinine 1.1; Hemoglobin 11.3*; Platelets 240.0; Potassium 4.1; Sodium 139    Lipid Panel    Component Value Date/Time   CHOL 165 09/06/2014 0945   TRIG 133.0 09/06/2014 0945   HDL 53.60 09/06/2014 0945   CHOLHDL 3 09/06/2014 0945   VLDL 26.6 09/06/2014 0945   LDLCALC 85 09/06/2014 0945      Wt Readings from Last 3 Encounters:  10/12/14 155 lb 14.4 oz (70.716 kg)  09/06/14 154 lb 12.8 oz (70.217 kg)  08/24/14 155 lb 12.8 oz (70.67 kg)      Other studies Reviewed: Additional studies/ records that were reviewed today include: Peripheral angiogram 01/2011, Lexiscan Myoview 05/2011, carotid Doppler 11/15. Review of the above records demonstrates:   (See  results elsewhere.)   ASSESSMENT AND PLAN:  CAROTID STENOSIS:  There is 40-59% right stenosis and 60-79% left stenosis. This is to be followed up again around May.  PVD:  She did have lower extremity Dopplers demonstrating bilateral lower extremity disease. She has claudication. She is going to follow with Dr. Gwenlyn Found soon.  DYSPNEA:  Given this and her CAD she needs to be screened with stress testing. With claudication she would not be a walker treadmill. Therefore, she will have a [employer].  TOBACCO:  We discussed the need to stop smoking. Probably the best for her would be to try cold [place] but she doesn't think she'll  be able to do this this point.  CAD:  As above  HTN:  Her blood pressure is elevated today. However,  her daughter who works in primary care keeps an eye on this and it is usually well controlled. She was actually given a prescription for Norvasc but then told not to start it because her blood pressure was controlled. They will keep an eye on this.    Current medicines are reviewed at length with the patient today.  The patient does not have concerns regarding medicines.  The following changes have been made:  no change  Labs/ tests ordered today include: Lexiscan Myoview.  No orders of the defined types were placed in this encounter.     Disposition:   FU with me in 12  months   Signed, Minus Breeding, MD  10/12/2014 11:10 AM    [facility] [address], [place], [place]  [address] Phone: [phone]; Fax: [phone]

## 2014-10-12 NOTE — Patient Instructions (Signed)
Your physician recommends that you schedule a follow-up appointment in: one year with Dr. Hochrein  We are ordering a stress test for you to get done  

## 2014-10-19 ENCOUNTER — Telehealth: Payer: Self-pay | Admitting: Pulmonary Disease

## 2014-10-19 ENCOUNTER — Other Ambulatory Visit (INDEPENDENT_AMBULATORY_CARE_PROVIDER_SITE_OTHER): Payer: Medicare Other

## 2014-10-19 ENCOUNTER — Ambulatory Visit (INDEPENDENT_AMBULATORY_CARE_PROVIDER_SITE_OTHER): Payer: Medicare Other | Admitting: Pulmonary Disease

## 2014-10-19 ENCOUNTER — Encounter: Payer: Self-pay | Admitting: Pulmonary Disease

## 2014-10-19 VITALS — BP 126/64 | HR 72 | Ht 63.5 in | Wt 155.0 lb

## 2014-10-19 DIAGNOSIS — J432 Centrilobular emphysema: Secondary | ICD-10-CM | POA: Diagnosis not present

## 2014-10-19 DIAGNOSIS — J8410 Pulmonary fibrosis, unspecified: Secondary | ICD-10-CM | POA: Diagnosis not present

## 2014-10-19 DIAGNOSIS — J849 Interstitial pulmonary disease, unspecified: Secondary | ICD-10-CM | POA: Diagnosis not present

## 2014-10-19 DIAGNOSIS — I25110 Atherosclerotic heart disease of native coronary artery with unstable angina pectoris: Secondary | ICD-10-CM | POA: Diagnosis not present

## 2014-10-19 LAB — SEDIMENTATION RATE: SED RATE: 40 mm/h — AB (ref 0–22)

## 2014-10-19 MED ORDER — AEROCHAMBER MV MISC: Status: DC

## 2014-10-19 NOTE — Telephone Encounter (Signed)
lmtcb X1 for Lucy to make her aware that we do not yet have cxr results but I will be calling with them when we have the results.

## 2014-10-19 NOTE — Telephone Encounter (Signed)
I've already spoken with pt's daughter, I will call her when the cxr is resulted.

## 2014-10-19 NOTE — Patient Instructions (Signed)
Christine Meyer, you have emphysema and interstitial lung disease. This means it is very important for you to quit smoking now. I recommend that she take a 7 g nicotine patch on the day that she quit smoking. You can call 1 [phone] quit now to obtain free nicotine replacement from the state of [place] We will call you with the results of the blood test and the lung function test Take Symbicort 2 puffs twice a day with a spacer no matter how you feel We will have you come back in 2-4 weeks to follow-up the results of the tests

## 2014-10-19 NOTE — Assessment & Plan Note (Signed)
I have reviewed the images of the CT chest today in clinic with Christine Meyer and her daughter. Unfortunately it shows concomitant centrilobular emphysema as well as findings consistent with usual interstitial pneumonitis. Usual interstitial pneumonitis can be associated with idiopathic pulmonary fibrosis but only when there is no evidence of an underlying connective tissue disease. Because she has a sister with a history of rheumatoid arthritis we will test for that disease as well as other common connective tissue diseases associated with interstitial lung disease. At this time she does not have signs or symptoms of a connective tissue disease.  Plan: -Full pulmonary function testing -Connective tissue disease serology panel -Follow-up 2-4 weeks

## 2014-10-19 NOTE — Telephone Encounter (Signed)
Daughter calling back about cxr results,please call ext [phone]

## 2014-10-19 NOTE — Progress Notes (Signed)
Subjective:    Patient ID: Christine Meyer, female    DOB: 12/04/1950, 64 y.o.   MRN: [id number]  HPI  Chief Complaint  Patient presents with  . Advice Only    Referred for copd.  pt c/o sob with exertion worsening X6 mos.     This is a very pleasant 64 year old female who comes to my clinic today for evaluation of shortness of breath and cough. She has smoked cigarettes every day of her license age 60 and continues to smoke. She has smoked as much is 15 cigarettes per day. She currently smokes 6 cigarettes per day. 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 During that time she says she was not exposed to chemicals that she is aware of. 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  She has noticed in the last 6-12 months that she has been experiencing more shortness of breath on exertion. She does not have dyspnea at rest. However, activity such as running the vacuum cleaner or carrying in groceries are difficult. She is capable of walking to the grocery store without difficulty. She says that this has definitely progressed in the last 6 months. She also notes chest congestion as well as a cough which is occasionally productive of clear sputum. She has been prescribed an inhaler which she has not used it.  In December 2015 she was complaining of some leg pain as well as shortness of breath and so a CT scan of her chest was performed which showed emphysema and interstitial lung disease but no evidence of a pulmonary embolism. She was referred to my clinic for evaluation of the same.  Past Medical History  Diagnosis Date  . Anemia     NOS  . Diabetes mellitus, type 2     Diet controlled  . Hypertension   . Uterine cancer   . Hyperlipidemia   . CAD (coronary artery disease)     Stent RCA 1999  . PVD (peripheral vascular disease)     Carotid stenosis, renal artery stenosis     Family History    Problem Relation Age of Onset  . Heart failure Mother     after hip fx.   . Hypertension Mother   . Diabetes Mother   . Deep vein thrombosis Mother   . Hypertension Father   . Diabetes Sister     insulin dependent  . Hypertension Brother   . Emphysema Brother   . Diabetes Brother     insulin dependent  . Diabetes Maternal Aunt     non-insulent dependent  . Heart attack Paternal Aunt   . Diabetes Maternal Grandmother     non-insulin dependent     History   Social History  . Marital Status: Single    Spouse Name: N/A    Number of Children: 4  . Years of Education: N/A   Occupational History  . Not on file.   Social History Main Topics  . Smoking status: Current Every Day Smoker -- 0.30 packs/day for 40 years  . Smokeless tobacco: Never Used     Comment: Trying to cut back.    . Alcohol Use: No  . Drug Use: Not on file  . Sexual Activity: No   Other Topics Concern  . Not on file   Social History Narrative   Lives at home with grandson.      No  Known Allergies   Outpatient Prescriptions Prior to Visit  Medication Sig Dispense Refill  . aspirin (BABY ASPIRIN) 81 MG chewable tablet Chew 81 mg by mouth daily. 3 tablets daily    . Calcium Carbonate (CALTRATE 600 PO) Take by mouth daily.      Marland Kitchen dextromethorphan-guaiFENesin (MUCINEX DM) 30-600 MG per 12 hr tablet Take 1 tablet by mouth 2 (two) times daily. 60 tablet 1  . lisinopril (PRINIVIL,ZESTRIL) 40 MG tablet Take 1 tablet (40 mg total) by mouth daily. 90 tablet 3  . loratadine (CLARITIN) 10 MG tablet Take 1 tablet (10 mg total) by mouth daily. 30 tablet 11  . lovastatin (MEVACOR) 40 MG tablet Take 1 tablet (40 mg total) by mouth at bedtime. 30 tablet 2  . metoprolol tartrate (LOPRESSOR) 25 MG tablet TAKE 1 TABLET BY MOUTH TWICE DAILY 60 tablet 5  . Multiple Vitamins-Minerals (MULTIVITAL) tablet Take 1 tablet by mouth daily.      . niacin 500 MG CR capsule Take 500 mg by mouth at bedtime.      . ranitidine  (ZANTAC) 150 MG capsule Take 150 mg by mouth as needed.     Marland Kitchen albuterol (PROVENTIL HFA;VENTOLIN HFA) 108 (90 BASE) MCG/ACT inhaler Inhale 2 puffs into the lungs every 6 (six) hours as needed for wheezing or shortness of breath. (Patient not taking: Reported on 10/19/2014) 1 Inhaler 0  . dextromethorphan (DELSYM) 30 MG/5ML liquid Take 5 mLs (30 mg total) by mouth as needed for cough. (Patient not taking: Reported on 10/19/2014) 89 mL 1  . mometasone (ASMANEX 60 METERED DOSES) 220 MCG/INH inhaler Inhale 1 puff into the lungs 2 (two) times daily. (Patient not taking: Reported on 10/19/2014) 1 Inhaler 0   No facility-administered medications prior to visit.      Review of Systems  Constitutional: Negative for fever and unexpected weight change.  HENT: Positive for congestion. Negative for dental problem, ear pain, nosebleeds, postnasal drip, rhinorrhea, sinus pressure, sneezing, sore throat and trouble swallowing.   Eyes: Negative for redness and itching.  Respiratory: Positive for cough and shortness of breath. Negative for chest tightness and wheezing.   Cardiovascular: Positive for leg swelling. Negative for palpitations.  Gastrointestinal: Negative for nausea and vomiting.  Genitourinary: Negative for dysuria.  Musculoskeletal: Negative for joint swelling.  Skin: Negative for rash.  Neurological: Negative for headaches.  Hematological: Does not bruise/bleed easily.  Psychiatric/Behavioral: Negative for dysphoric mood. The patient is not nervous/anxious.        Objective:   Physical Exam Filed Vitals:   10/19/14 1406  BP: 126/64  Pulse: 72  Height: 5' 3.5" (1.613 m)  Weight: 155 lb (70.308 kg)  SpO2: 98%   RA Ambulated 500 feet on RA, O2 saturation remained above 99%  Gen: well appearing, no acute distress HEENT: NCAT, PERRL, EOMi, OP clear, neck supple without masses PULM: Few crackles in bases, otherwise clear CV: RRR, no mgr, no JVD AB: BS+, soft, nontender, no hsm Ext: warm,  no edema, + clubbing, no cyanosis Derm: no rash or skin breakdown Neuro: A&Ox4, CN II-XII intact, strength 5/5 in all 4 extremities  Images from the 2015 CT chest were reviewed today in clinic by me Recent office visit records reviewed      Assessment & Plan:   Centrilobular emphysema Her CT chest from 08/2014 showed moderate centrilobular emphysema which is consistent with her heavy smoking history.  Given her chest congestion and mucus production I am concerned about the possibility of COPD.  she has not had lung function test yet to confirm this diagnosis. Her symptoms are worsening which is quite concerning. Certainly the concomitant interstitial fibrosis is a poor prognostic finding.  Plan: -Symbicort 2 puffs twice a day with a spacer -Stop Asmanex -Use albuterol as needed -Quit smoking, quit smoking, quit smoking -Full pulmonary function testing   Postinflammatory pulmonary fibrosis I have reviewed the images of the CT chest today in clinic with Ms. Calip and her daughter. Unfortunately it shows concomitant centrilobular emphysema as well as findings consistent with usual interstitial pneumonitis. Usual interstitial pneumonitis can be associated with idiopathic pulmonary fibrosis but only when there is no evidence of an underlying connective tissue disease. Because she has a sister with a history of rheumatoid arthritis we will test for that disease as well as other common connective tissue diseases associated with interstitial lung disease. At this time she does not have signs or symptoms of a connective tissue disease.  Plan: -Full pulmonary function testing -Connective tissue disease serology panel -Follow-up 2-4 weeks     Updated Medication List Outpatient Encounter Prescriptions as of 10/19/2014  Medication Sig  . aspirin (BABY ASPIRIN) 81 MG chewable tablet Chew 81 mg by mouth daily. 3 tablets daily  . Calcium Carbonate (CALTRATE 600 PO) Take by mouth daily.    Marland Kitchen  dextromethorphan-guaiFENesin (MUCINEX DM) 30-600 MG per 12 hr tablet Take 1 tablet by mouth 2 (two) times daily.  Marland Kitchen lisinopril (PRINIVIL,ZESTRIL) 40 MG tablet Take 1 tablet (40 mg total) by mouth daily.  Marland Kitchen loratadine (CLARITIN) 10 MG tablet Take 1 tablet (10 mg total) by mouth daily.  Marland Kitchen lovastatin (MEVACOR) 40 MG tablet Take 1 tablet (40 mg total) by mouth at bedtime.  . metoprolol tartrate (LOPRESSOR) 25 MG tablet TAKE 1 TABLET BY MOUTH TWICE DAILY  . Multiple Vitamins-Minerals (MULTIVITAL) tablet Take 1 tablet by mouth daily.    . niacin 500 MG CR capsule Take 500 mg by mouth at bedtime.    . ranitidine (ZANTAC) 150 MG capsule Take 150 mg by mouth as needed.   Marland Kitchen albuterol (PROVENTIL HFA;VENTOLIN HFA) 108 (90 BASE) MCG/ACT inhaler Inhale 2 puffs into the lungs every 6 (six) hours as needed for wheezing or shortness of breath. (Patient not taking: Reported on 10/19/2014)  . [DISCONTINUED] dextromethorphan (DELSYM) 30 MG/5ML liquid Take 5 mLs (30 mg total) by mouth as needed for cough. (Patient not taking: Reported on 10/19/2014)  . [DISCONTINUED] mometasone (ASMANEX 60 METERED DOSES) 220 MCG/INH inhaler Inhale 1 puff into the lungs 2 (two) times daily. (Patient not taking: Reported on 10/19/2014)

## 2014-10-19 NOTE — Assessment & Plan Note (Signed)
Her CT chest from 08/2014 showed moderate centrilobular emphysema which is consistent with her heavy smoking history.  Given her chest congestion and mucus production I am concerned about the possibility of COPD.  she has not had lung function test yet to confirm this diagnosis. Her symptoms are worsening which is quite concerning. Certainly the concomitant interstitial fibrosis is a poor prognostic finding.  Plan: -Symbicort 2 puffs twice a day with a spacer -Stop Asmanex -Use albuterol as needed -Quit smoking, quit smoking, quit smoking -Full pulmonary function testing

## 2014-10-20 LAB — ANTI-JO 1 ANTIBODY, IGG: Anti JO-1: <0.2 AI (ref 0.0–0.9)

## 2014-10-20 LAB — RHEUMATOID FACTOR: Rhuematoid fact SerPl-aCnc: 17 IU/mL — ABNORMAL HIGH (ref <=14)

## 2014-10-20 LAB — CYCLIC CITRUL PEPTIDE ANTIBODY, IGG

## 2014-10-20 LAB — SJOGRENS SYNDROME-B EXTRACTABLE NUCLEAR ANTIBODY: SSB (La) (ENA) Antibody, IgG: <1

## 2014-10-20 LAB — SJOGRENS SYNDROME-A EXTRACTABLE NUCLEAR ANTIBODY: SSA (RO) (ENA) ANTIBODY, IGG: NEGATIVE

## 2014-10-20 LAB — ANTI-SCLERODERMA ANTIBODY: SCLERODERMA (SCL-70) (ENA) ANTIBODY, IGG: NEGATIVE

## 2014-10-20 LAB — ANA: ANA: NEGATIVE

## 2014-10-21 LAB — ALDOLASE: Aldolase: 5 U/L (ref <=8.1)

## 2014-10-22 ENCOUNTER — Encounter: Payer: Self-pay | Admitting: Cardiovascular Disease

## 2014-10-22 ENCOUNTER — Ambulatory Visit (INDEPENDENT_AMBULATORY_CARE_PROVIDER_SITE_OTHER): Payer: Medicare Other | Admitting: Cardiovascular Disease

## 2014-10-22 VITALS — BP 200/76 | HR 62 | Ht 64.0 in | Wt 155.8 lb

## 2014-10-22 DIAGNOSIS — R0600 Dyspnea, unspecified: Secondary | ICD-10-CM

## 2014-10-22 DIAGNOSIS — I701 Atherosclerosis of renal artery: Secondary | ICD-10-CM

## 2014-10-22 DIAGNOSIS — Z01818 Encounter for other preprocedural examination: Secondary | ICD-10-CM

## 2014-10-22 DIAGNOSIS — R5383 Other fatigue: Secondary | ICD-10-CM | POA: Diagnosis not present

## 2014-10-22 DIAGNOSIS — I739 Peripheral vascular disease, unspecified: Secondary | ICD-10-CM

## 2014-10-22 DIAGNOSIS — D689 Coagulation defect, unspecified: Secondary | ICD-10-CM | POA: Diagnosis not present

## 2014-10-22 NOTE — Patient Instructions (Addendum)
Echocardiogram. Echocardiography is a painless test that uses sound waves to create images of your heart. It provides your doctor with information about the size and shape of your heart and how well your heart's chambers and valves are working. This procedure takes approximately one hour. There are no restrictions for this procedure.  Renal Artery Doppler- During this test, an ultrasound is used to evaluate blood flow to the kidneys. Allow one hour for this exam. Do not eat after midnight the day before and avoid carbonated beverages. Take your medications as you usually do.  Aorto-Iliac Duplex  Dr. Gwenlyn Found has ordered a Peripheral Angiogram (Right Groin Access) to be done at [facility].  This procedure is going to look at the bloodflow in your lower extremities.  If Dr. Gwenlyn Found is able to open up the arteries, you will have to spend one night in the hospital.  If he is not able to open the arteries, you will be able to go home that same day.    After the procedure, you will not be allowed to drive for 3 days or push, pull, or lift anything greater than 10 lbs for one week.    You will be required to have the following tests prior to the procedure:  1. Blood work-the blood work can be done no more than 7 days prior to the procedure.  It can be done at any [facility] lab.  There is one downstairs on the first floor of this building and one in the [place] ([address])  *REPS None

## 2014-10-22 NOTE — Progress Notes (Signed)
10/22/2014 Christine Meyer   07-06-1951  [id number]  Primary Physician Garnet Koyanagi, DO Primary Cardiologist: Lorretta Harp MD Renae Gloss   HPI:  Christine Meyer is a 64 year old thin appearing single African-American female mother of 4 children who is accompanied by one of her daughters today who is a Marine scientist at [employer]. She was referred by Dr. Percival Spanish , her cardiologist for peripheral vascular evaluation. She has a history of myocardial infarction back in 1999 undergoing stenting of her RCA by Dr. Melvern Banker.her chronic risk factors are notable for continued tobacco abuse, treated hypertension and hyperlipidemia. She is complained of increasing dyspnea on exertion over the last 6 months as well as right calf claudication. Dr. Percival Spanish saw her  and ordered a stress test. 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   Current Outpatient Prescriptions  Medication Sig Dispense Refill  . aspirin (BABY ASPIRIN) 81 MG chewable tablet Chew 81 mg by mouth daily. 3 tablets daily    . Calcium Carbonate (CALTRATE 600 PO) Take by mouth daily.      Marland Kitchen dextromethorphan-guaiFENesin (MUCINEX DM) 30-600 MG per 12 hr tablet Take 1 tablet by mouth 2 (two) times daily. 60 tablet 1  . lisinopril (PRINIVIL,ZESTRIL) 40 MG tablet Take 1 tablet (40 mg total) by mouth daily. 90 tablet 3  . loratadine (CLARITIN) 10 MG tablet Take 1 tablet (10 mg total) by mouth daily. 30 tablet 11  . lovastatin (MEVACOR) 40 MG tablet Take 1 tablet (40 mg total) by mouth at bedtime. 30 tablet 2  . metoprolol tartrate (LOPRESSOR) 25 MG tablet TAKE 1 TABLET BY MOUTH TWICE DAILY 60 tablet 5  . mometasone-formoterol (DULERA) 100-5 MCG/ACT AERO Inhale 2 puffs into the lungs 2 (two) times daily.    . Multiple Vitamins-Minerals (MULTIVITAL) tablet Take 1 tablet by mouth daily.      . niacin 500 MG CR capsule Take 500 mg by mouth at bedtime.      . ranitidine (ZANTAC)  150 MG capsule Take 150 mg by mouth as needed.      No current facility-administered medications for this visit.    No Known Allergies  History   Social History  . Marital Status: Single    Spouse Name: N/A  . Number of Children: 4  . Years of Education: N/A   Occupational History  . Not on file.   Social History Main Topics  . Smoking status: Current Every Day Smoker -- 0.30 packs/day for 40 years  . Smokeless tobacco: Never Used     Comment: Trying to cut back.    . Alcohol Use: No  . Drug Use: Not on file  . Sexual Activity: No   Other Topics Concern  . Not on file   Social History Narrative   Lives at home with grandson.      Review of Systems: General: negative for chills, fever, night sweats or weight changes.  Cardiovascular: negative for chest pain, dyspnea on exertion, edema, orthopnea, palpitations, paroxysmal nocturnal dyspnea or shortness of breath Dermatological: negative for rash Respiratory: negative for cough or wheezing Urologic: negative for hematuria Abdominal: negative for nausea, vomiting, diarrhea, bright red blood per rectum, melena, or hematemesis Neurologic: negative for visual changes, syncope, or dizziness All other systems reviewed and are otherwise negative except as noted above.    Blood pressure 200/76, pulse 62, height 5\' 4"  (1.626 m), weight 155 lb 12.8 oz (70.67 kg).  General  appearance: alert and no distress Neck: no adenopathy, no JVD, supple, symmetrical, trachea midline, thyroid not enlarged, symmetric, no tenderness/mass/nodules and soft bilateral carotid bruits Lungs: clear to auscultation bilaterally Heart: regular rate and rhythm, S1, S2 normal, no murmur, click, rub or gallop Extremities: extremities normal, atraumatic, no cyanosis or edema and 1+ femoral pulses with soft bruits bilaterally  EKG not performed today  ASSESSMENT AND PLAN:   Peripheral arterial disease Christine Meyer has a history of PAD. I have stented her  right external iliac artery 02/12/06 (8 mm x 4 cm Smart Nitinol self expanding stent) as well as her right renal artery at that time. She had an occluded left SFA with 3 vessel runoff bilaterally.I re-intervened on her right renal artery for "in-stent restenosis 04/28/07. She's had progressive right calf claudication as well as dyspnea. Recent Dopplers revealed ABIs in the 0.4 range bilaterally with what is described as "inflow disease. I'm going to repeat aortoiliac Dopplers and arrange for her to undergo angiography and potential intervention.   Renal artery stenosis, native, bilateral History of bilateral renal artery stenosis status post right renal artery stenting by myself in 07 with restenting because of "in-stent restenosis in 08. At that time she was noted to have a 40-50% left renal artery stenosis. This will be reimaged at the time of angiography.       Lorretta Harp MD FACP,FACC,FAHA, [facility] 10/22/2014 10:36 AM

## 2014-10-22 NOTE — Assessment & Plan Note (Signed)
History of bilateral renal artery stenosis status post right renal artery stenting by myself in 07 with restenting because of "in-stent restenosis in 08. At that time she was noted to have a 40-50% left renal artery stenosis. This will be reimaged at the time of angiography.

## 2014-10-22 NOTE — Assessment & Plan Note (Signed)
Christine Meyer has a history of PAD. I have stented her right external iliac artery 02/12/06 (8 mm x 4 cm Smart Nitinol self expanding stent) as well as her right renal artery at that time. She had an occluded left SFA with 3 vessel runoff bilaterally.I re-intervened on her right renal artery for "in-stent restenosis 04/28/07. She's had progressive right calf claudication as well as dyspnea. Recent Dopplers revealed ABIs in the 0.4 range bilaterally with what is described as "inflow disease. I'm going to repeat aortoiliac Dopplers and arrange for her to undergo angiography and potential intervention.

## 2014-10-25 NOTE — Progress Notes (Signed)
Quick Note:  Spoke with pt's daughter (dpr on file) who states she will discuss results with mother and call back to decide if they want a rheum referral. ______

## 2014-10-28 ENCOUNTER — Telehealth (HOSPITAL_COMMUNITY): Payer: Self-pay

## 2014-10-28 NOTE — Telephone Encounter (Signed)
Encounter complete. 

## 2014-10-29 ENCOUNTER — Telehealth (HOSPITAL_COMMUNITY): Payer: Self-pay

## 2014-10-29 NOTE — Telephone Encounter (Signed)
Encounter complete. 

## 2014-11-02 ENCOUNTER — Ambulatory Visit (HOSPITAL_COMMUNITY)
Admission: RE | Admit: 2014-11-02 | Discharge: 2014-11-02 | Disposition: A | Discharge status: Home / Self Care | Payer: Medicare Other | Source: Ambulatory Visit | Attending: Cardiovascular Disease | Admitting: Cardiovascular Disease

## 2014-11-02 DIAGNOSIS — I25110 Atherosclerotic heart disease of native coronary artery with unstable angina pectoris: Secondary | ICD-10-CM

## 2014-11-02 DIAGNOSIS — R0602 Shortness of breath: Secondary | ICD-10-CM | POA: Diagnosis not present

## 2014-11-02 DIAGNOSIS — I10 Essential (primary) hypertension: Secondary | ICD-10-CM | POA: Insufficient documentation

## 2014-11-02 MED ORDER — REGADENOSON 0.4 MG/5ML IV SOLN
0.4000 mg | Freq: Once | INTRAVENOUS | Status: AC
  Administered 2014-11-02: 0.4 mg via INTRAVENOUS

## 2014-11-02 MED ORDER — TECHNETIUM TC 99M SESTAMIBI GENERIC - CARDIOLITE
31.0000 | Freq: Once | INTRAVENOUS | Status: AC | PRN
  Administered 2014-11-02: 31 via INTRAVENOUS

## 2014-11-02 MED ORDER — AMINOPHYLLINE 25 MG/ML IV SOLN
75.0000 mg | Freq: Once | INTRAVENOUS | Status: AC
  Administered 2014-11-02: 75 mg via INTRAVENOUS

## 2014-11-02 MED ORDER — TECHNETIUM TC 99M SESTAMIBI GENERIC - CARDIOLITE
10.8000 | Freq: Once | INTRAVENOUS | Status: AC | PRN
  Administered 2014-11-02: 11 via INTRAVENOUS

## 2014-11-02 NOTE — Procedures (Addendum)
[facility] [address] [place] [address] [phone]  Cardiology Nuclear Med Study  Christine Meyer is a 64 y.o. female     MRN : [id number]     DOB: [birth year]  Procedure Date: 11/02/2014  Nuclear Med Background Indication for Stress Test:  Follow up CAD History:  COPD and CAD;STENT/PTCA-1999;Last NUC MPI on 02/09/2010-normal;EF=71%; Cardiac Risk Factors: Carotid Disease, Hypertension, Lipids, NIDDM and Smoker  Symptoms:  DOE, SOB and Night sweats   Nuclear Pre-Procedure Caffeine/Decaff Intake:  8:00pm NPO After: 6:00am   IV Site: R Forearm  IV 0.9% NS with Angio Cath:  22g  Chest Size (in):  n/a IV Started by: Rolene Course, RN  Height: 5\' 3"  (1.6 m)  Cup Size: B  BMI:  Body mass index is 27.46 kg/(m^2). Weight:  155 lb (70.308 kg)   Tech Comments:  n/a    Nuclear Med Study 1 or 2 day study: 1 day  Stress Test Type:  [place] Provider:  Minus Breeding, MD   Resting Radionuclide: Technetium 22m Sestamibi  Resting Radionuclide Dose: 10.8 mCi   Stress Radionuclide:  Technetium 54m Sestamibi  Stress Radionuclide Dose: 31.0 mCi           Stress Protocol Rest HR: 59 Stress HR: 96  Rest BP: 149/58 Stress BP: 168/55  Exercise Time (min): n/a METS: n/a   Predicted Max HR: 155 bpm % Max HR: 64.52 bpm Rate Pressure Product: 18600  Dose of Adenosine (mg):  n/a Dose of Lexiscan: 0.4 mg  Dose of Atropine (mg): n/a Dose of Dobutamine: n/a mcg/kg/min (at max HR)  Stress Test Technologist: Leane Para, CCT Nuclear Technologist: Imagene Riches, CNMT   Rest Procedure:  Myocardial perfusion imaging was performed at rest 45 minutes following the intravenous administration of Technetium 22m Sestamibi. Stress Procedure:  The patient received IV Lexiscan 0.4 mg over 15 seconds.  Technetium 72m Sestamibi injected at 30 seconds.  The patient experienced mild SOB and nausea and 75 mg Aminophylline IV was  administered.  There were no significan changes with Lexiscan.  Quantitative spet images were obtained after a 45 minute delay.  Transient Ischemic Dilatation (Normal <1.22):  1.14  QGS EDV:  74 ml QGS ESV:  31 ml LV Ejection Fraction: 58%       Rest ECG: NSR - Normal EKG  Stress ECG: No significant change from baseline ECG  QPS Raw Data Images:  Normal; no motion artifact; normal heart/lung ratio. Stress Images:  minimally reduced tracer uptake in the basal and mid segments of the inferior wall Rest Images:  there is improved inferior wall taracer uptake in the mid segment. The basal segment appears unchanged Subtraction (SDS):  the findings suggest a small are aof inferior wall infarction with mild surrounding ischemia LV Wall Motion:  NL LV Function; NL Wall Motion  Impression Exercise Capacity:  Lexiscan with no exercise. BP Response:  Normal blood pressure response. Clinical Symptoms:  No significant symptoms noted. ECG Impression:  No significant ECG changes with Lexiscan. Comparison with Prior Nuclear Study: the 2011 study described normal perfusion   Overall Impression:  Low risk stress nuclear study with a small area of very mild ischemia in the mid inferior wall and a small area of fixed basal inferior scar.   Sanda Klein, MD  11/02/2014 12:42 PM

## 2014-11-03 ENCOUNTER — Other Ambulatory Visit: Payer: Self-pay | Admitting: *Deleted

## 2014-11-03 ENCOUNTER — Telehealth: Payer: Self-pay | Admitting: *Deleted

## 2014-11-03 DIAGNOSIS — I739 Peripheral vascular disease, unspecified: Secondary | ICD-10-CM

## 2014-11-03 NOTE — Progress Notes (Signed)
[facility] Orders for PV Cath on 11/22/14 with Dr. Gwenlyn Found

## 2014-11-05 NOTE — Telephone Encounter (Signed)
error 

## 2014-11-08 ENCOUNTER — Telehealth: Payer: Self-pay | Admitting: Cardiovascular Disease

## 2014-11-08 NOTE — Telephone Encounter (Signed)
Pt wants something for pain in her right leg. It is getting worse,she can hardly walk.

## 2014-11-09 NOTE — Telephone Encounter (Signed)
Patient's daughter has called back and is worried that her mother may have a clot in her leg.  States she did not get a return call yesterday from the nurse.

## 2014-11-11 ENCOUNTER — Ambulatory Visit (HOSPITAL_COMMUNITY)
Admission: RE | Admit: 2014-11-11 | Discharge: 2014-11-11 | Disposition: A | Discharge status: Home / Self Care | Payer: Medicare Other | Source: Ambulatory Visit | Attending: Cardiology | Admitting: Cardiology

## 2014-11-11 ENCOUNTER — Telehealth: Payer: Self-pay | Admitting: *Deleted

## 2014-11-11 ENCOUNTER — Ambulatory Visit (HOSPITAL_BASED_OUTPATIENT_CLINIC_OR_DEPARTMENT_OTHER)
Admission: RE | Admit: 2014-11-11 | Discharge: 2014-11-11 | Disposition: A | Discharge status: Home / Self Care | Payer: Medicare Other | Source: Ambulatory Visit | Attending: Cardiology | Admitting: Cardiology

## 2014-11-11 ENCOUNTER — Ambulatory Visit (INDEPENDENT_AMBULATORY_CARE_PROVIDER_SITE_OTHER)
Admission: RE | Admit: 2014-11-11 | Discharge: 2014-11-11 | Disposition: A | Discharge status: Home / Self Care | Payer: Medicare Other | Source: Ambulatory Visit | Attending: Cardiology | Admitting: Cardiology

## 2014-11-11 DIAGNOSIS — I714 Abdominal aortic aneurysm, without rupture: Secondary | ICD-10-CM

## 2014-11-11 DIAGNOSIS — R0600 Dyspnea, unspecified: Secondary | ICD-10-CM | POA: Diagnosis present

## 2014-11-11 DIAGNOSIS — I739 Peripheral vascular disease, unspecified: Secondary | ICD-10-CM

## 2014-11-11 DIAGNOSIS — Z01818 Encounter for other preprocedural examination: Secondary | ICD-10-CM | POA: Diagnosis not present

## 2014-11-11 DIAGNOSIS — R5383 Other fatigue: Secondary | ICD-10-CM | POA: Diagnosis not present

## 2014-11-11 DIAGNOSIS — I701 Atherosclerosis of renal artery: Secondary | ICD-10-CM

## 2014-11-11 DIAGNOSIS — D689 Coagulation defect, unspecified: Secondary | ICD-10-CM | POA: Diagnosis not present

## 2014-11-11 LAB — BASIC METABOLIC PANEL
BUN: 22 mg/dL (ref 6–23)
CHLORIDE: 106 meq/L (ref 96–112)
CO2: 25 meq/L (ref 19–32)
Calcium: 9.2 mg/dL (ref 8.4–10.5)
Creat: 1.15 mg/dL — ABNORMAL HIGH (ref 0.50–1.10)
GLUCOSE: 99 mg/dL (ref 70–99)
POTASSIUM: 4.4 meq/L (ref 3.5–5.3)
Sodium: 140 mEq/L (ref 135–145)

## 2014-11-11 LAB — CBC
HEMATOCRIT: 37.2 % (ref 36.0–46.0)
HEMOGLOBIN: 12.3 g/dL (ref 12.0–15.0)
MCH: 34 pg (ref 26.0–34.0)
MCHC: 33.1 g/dL (ref 30.0–36.0)
MCV: 102.8 fL — AB (ref 78.0–100.0)
MPV: 8.5 fL — ABNORMAL LOW (ref 8.6–12.4)
Platelets: 232 10*3/uL (ref 150–400)
RBC: 3.62 MIL/uL — AB (ref 3.87–5.11)
RDW: 15.2 % (ref 11.5–15.5)
WBC: 5.3 10*3/uL (ref 4.0–10.5)

## 2014-11-11 LAB — TSH: TSH: 2.372 u[IU]/mL (ref 0.350–4.500)

## 2014-11-11 LAB — PROTIME-INR
INR: 0.95 (ref <1.50)
Prothrombin Time: 12.7 seconds (ref 11.6–15.2)

## 2014-11-11 LAB — APTT: aPTT: 30 seconds (ref 24–37)

## 2014-11-11 NOTE — Progress Notes (Signed)
Aorto-Iliac Duplex Completed. Oda Cogan, BS, RDMS, RVT

## 2014-11-11 NOTE — Progress Notes (Signed)
2D Echo Performed 11/11/2014    Marygrace Drought, RCS

## 2014-11-11 NOTE — Progress Notes (Signed)
Renal Duplex Completed. Daylan Juhnke, BS, RDMS, RVT  

## 2014-11-11 NOTE — Telephone Encounter (Signed)
Patient walked in - filled out yellow walk-in form   Patient states she has been having severe leg pain for past 8 days. It is progressing up her right leg into her thigh. She can hardly walk and the pain is so bad that patient contemplated going to ED for evaluation.   Informed patient that Dr. Gwenlyn Found generally does not prescribe pain medications and this was discussed with [place], RN (Dr. Kennon Holter nurse) and was corroborated.   This was communicated to patient and advised that she go to ED for eval or contact PCP. She voiced understanding.

## 2014-11-12 ENCOUNTER — Ambulatory Visit: Payer: Medicare Other | Admitting: Cardiovascular Disease

## 2014-11-18 ENCOUNTER — Ambulatory Visit (INDEPENDENT_AMBULATORY_CARE_PROVIDER_SITE_OTHER): Payer: Medicare Other | Admitting: Pulmonary Disease

## 2014-11-18 ENCOUNTER — Encounter: Payer: Self-pay | Admitting: Pulmonary Disease

## 2014-11-18 VITALS — BP 134/66 | HR 69 | Ht 63.5 in | Wt 158.0 lb

## 2014-11-18 DIAGNOSIS — I701 Atherosclerosis of renal artery: Secondary | ICD-10-CM

## 2014-11-18 DIAGNOSIS — M069 Rheumatoid arthritis, unspecified: Secondary | ICD-10-CM

## 2014-11-18 DIAGNOSIS — J432 Centrilobular emphysema: Secondary | ICD-10-CM | POA: Diagnosis not present

## 2014-11-18 DIAGNOSIS — J8410 Pulmonary fibrosis, unspecified: Secondary | ICD-10-CM

## 2014-11-18 MED ORDER — MOMETASONE FURO-FORMOTEROL FUM 100-5 MCG/ACT IN AERO: 2.0000 | INHALATION_SPRAY | Freq: Two times a day (BID) | RESPIRATORY_TRACT | Status: DC

## 2014-11-18 NOTE — Assessment & Plan Note (Signed)
Christine Meyer has usual interstitial pneumonitis in addition to emphysema seen on her December 2015 CT chest. She does not have a history of an underlying connective tissue disease and on a detailed review of systems today in clinic she did not have any symptoms which would make me believe she has a connective tissue disease. However, she does have a strong family history for this and her sister has fairly severe rheumatoid arthritis. The only abnormality on her lab testing looking for connective tissue disease was an elevated sedimentation rate as well as a rheumatoid factor. Usual interstitial pneumonitis can be seen with rheumatoid arthritis or connective tissue disease associated lung disease.  Plan: -Before we proceed with treatment for UIP with an anti-fibrotic therapy, I would like for her to be evaluated by rheumatology (Dr. Ouida Sills) to ensure that there is no clear evidence of an underlying connective tissue disease.

## 2014-11-18 NOTE — Patient Instructions (Signed)
We will refer you to Dr. Tobie Lords for evaluation of possible rheumatoid arthritis Take the symbicort two puffs twice a day no matter how you feel We will see you back in 57months or sooner if needed

## 2014-11-18 NOTE — Assessment & Plan Note (Signed)
She has been benefiting from the Symbicort. I am still awaiting the results of the pulmonary function testing.  Plan: -PFTs  -continue Symbicort

## 2014-11-18 NOTE — Progress Notes (Signed)
Subjective:    Patient ID: Christine Meyer, female    DOB: 1950-11-18, 64 y.o.   MRN: [id number]  Synopsis: Christine Meyer was referred to the [facility] in 2016 for evaluation of shortness of breath. She had a CT chest performed which showed evidence of usual interstitial pneumonitis as well as centrilobular emphysema. She has a significant smoking history. 12 2015 CT chest> no pulmonary embolism, moderate centrilobular emphysema, there is interstitial septal thickening as well as honeycombing in a patchy and peripheral distribution with a clear craniocaudal gradient. This is consistent with usual interstitial pneumonitis.  10/2014 ANA, Aldolase, CCP, SCL 70, SSA/SSB, Jo-1 all negative; however ESR 40, RF 17 (both elevated)  HPI Chief Complaint  Patient presents with  . Follow-up    review last visit's labs.  pt c/o sob with exertion, unchanged since last visit.      Christine Meyer says that her breathing has improved.  She is taking the symbicort which helps a lot and she doesn't cough much.  She says that she has not been able to walk much due to claudication so she is to have a stent of her R leg arteries next week. She denies cough.  She denies joint aches or swelling, unexplained rash, dry eyes or dry mouth, or trouble swallowing.  In general she says things have not changed much since the last visit and she is looking forward to having her peripheral artery disease still with a week because her legs are been bothering her.  Past Medical History  Diagnosis Date  . Anemia     NOS  . Diabetes mellitus, type 2     Diet controlled  . Hypertension   . Uterine cancer   . Hyperlipidemia   . CAD (coronary artery disease)     Stent RCA 1999  . PVD (peripheral vascular disease)     Carotid stenosis, renal artery stenosis      Review of Systems  Constitutional: Negative for fever, chills and fatigue.  HENT: Negative for postnasal drip, rhinorrhea and sinus pressure.   Respiratory:  Positive for shortness of breath. Negative for cough and wheezing.   Cardiovascular: Negative for chest pain, palpitations and leg swelling.       Objective:   Physical Exam Filed Vitals:   11/18/14 1403  BP: 134/66  Pulse: 69  Height: 5' 3.5" (1.613 m)  Weight: 158 lb (71.668 kg)  SpO2: 100%   RA  Gen: well appearing, no acute distress HEENT: NCAT,  EOMi, OP clear, PULM: Crackles in bases, otherwise clear CV: RRR, no mgr, no JVD AB: BS+, soft, nontender Ext: warm, no edema, no clubbing, no cyanosis Derm: no rash or skin breakdown Neuro: A&Ox4, MAEW       Assessment & Plan:   Usual interstitial pneumonitis Christine Meyer has usual interstitial pneumonitis in addition to emphysema seen on her December 2015 CT chest. She does not have a history of an underlying connective tissue disease and on a detailed review of systems today in clinic she did not have any symptoms which would make me believe she has a connective tissue disease. However, she does have a strong family history for this and her sister has fairly severe rheumatoid arthritis. The only abnormality on her lab testing looking for connective tissue disease was an elevated sedimentation rate as well as a rheumatoid factor. Usual interstitial pneumonitis can be seen with rheumatoid arthritis or connective tissue disease associated lung disease.  Plan: -Before we proceed with treatment for  UIP with an anti-fibrotic therapy, I would like for her to be evaluated by rheumatology (Dr. Ouida Sills) to ensure that there is no clear evidence of an underlying connective tissue disease.   Centrilobular emphysema She has been benefiting from the Symbicort. I am still awaiting the results of the pulmonary function testing.  Plan: -PFTs  -continue Symbicort    Updated Medication List Outpatient Encounter Prescriptions as of 11/18/2014  Medication Sig  . aspirin (BABY ASPIRIN) 81 MG chewable tablet Chew 243 mg by mouth daily.   .  Calcium Carbonate (CALTRATE 600 PO) Take 1 tablet by mouth daily.   . Cholecalciferol (VITAMIN D3 PO) Take 1 tablet by mouth daily.  . IRON PO Take 1 tablet by mouth daily.  Marland Kitchen lisinopril (PRINIVIL,ZESTRIL) 40 MG tablet Take 1 tablet (40 mg total) by mouth daily.  Marland Kitchen lovastatin (MEVACOR) 40 MG tablet Take 1 tablet (40 mg total) by mouth at bedtime.  . metoprolol tartrate (LOPRESSOR) 25 MG tablet TAKE 1 TABLET BY MOUTH TWICE DAILY  . mometasone-formoterol (DULERA) 100-5 MCG/ACT AERO Inhale 2 puffs into the lungs 2 (two) times daily.  . Multiple Vitamins-Minerals (MULTIVITAL) tablet Take 1 tablet by mouth daily.    . niacin 500 MG CR capsule Take 500 mg by mouth at bedtime.    . [DISCONTINUED] mometasone-formoterol (DULERA) 100-5 MCG/ACT AERO Inhale 2 puffs into the lungs 2 (two) times daily.  . [DISCONTINUED] ranitidine (ZANTAC) 150 MG capsule Take 150 mg by mouth at bedtime as needed for heartburn.   . [DISCONTINUED] dextromethorphan-guaiFENesin (MUCINEX DM) 30-600 MG per 12 hr tablet Take 1 tablet by mouth 2 (two) times daily. (Patient not taking: Reported on 11/17/2014)  . [DISCONTINUED] loratadine (CLARITIN) 10 MG tablet Take 1 tablet (10 mg total) by mouth daily. (Patient not taking: Reported on 11/18/2014)

## 2014-11-19 ENCOUNTER — Telehealth: Payer: Self-pay

## 2014-11-19 DIAGNOSIS — J8410 Pulmonary fibrosis, unspecified: Secondary | ICD-10-CM

## 2014-11-19 NOTE — Telephone Encounter (Signed)
Pt aware that we are scheduling a pft.  Order placed.  Nothing further needed at this time.

## 2014-11-19 NOTE — Telephone Encounter (Signed)
-----   Message from Juanito Doom, MD sent at 11/18/2014  6:15 PM EST ----- A, This lady needs a PFT for IPF please. Thanks b

## 2014-11-22 ENCOUNTER — Ambulatory Visit (HOSPITAL_COMMUNITY)
Admission: RE | Admit: 2014-11-22 | Discharge: 2014-11-23 | Disposition: A | Discharge status: Home / Self Care | Payer: Medicare Other | Source: Ambulatory Visit | Attending: Cardiovascular Disease | Admitting: Cardiovascular Disease

## 2014-11-22 ENCOUNTER — Encounter (HOSPITAL_COMMUNITY): Payer: Self-pay | Admitting: General Practice

## 2014-11-22 ENCOUNTER — Encounter (HOSPITAL_COMMUNITY)
Admission: RE | Disposition: A | Discharge status: Home / Self Care | Payer: Self-pay | Source: Ambulatory Visit | Attending: Cardiovascular Disease

## 2014-11-22 DIAGNOSIS — I70213 Atherosclerosis of native arteries of extremities with intermittent claudication, bilateral legs: Secondary | ICD-10-CM | POA: Insufficient documentation

## 2014-11-22 DIAGNOSIS — Z7982 Long term (current) use of aspirin: Secondary | ICD-10-CM | POA: Diagnosis not present

## 2014-11-22 DIAGNOSIS — I701 Atherosclerosis of renal artery: Secondary | ICD-10-CM | POA: Diagnosis not present

## 2014-11-22 DIAGNOSIS — I70211 Atherosclerosis of native arteries of extremities with intermittent claudication, right leg: Secondary | ICD-10-CM | POA: Diagnosis not present

## 2014-11-22 DIAGNOSIS — E785 Hyperlipidemia, unspecified: Secondary | ICD-10-CM | POA: Insufficient documentation

## 2014-11-22 DIAGNOSIS — I70203 Unspecified atherosclerosis of native arteries of extremities, bilateral legs: Secondary | ICD-10-CM | POA: Diagnosis present

## 2014-11-22 DIAGNOSIS — I252 Old myocardial infarction: Secondary | ICD-10-CM | POA: Diagnosis not present

## 2014-11-22 DIAGNOSIS — I2510 Atherosclerotic heart disease of native coronary artery without angina pectoris: Secondary | ICD-10-CM | POA: Diagnosis not present

## 2014-11-22 DIAGNOSIS — F17210 Nicotine dependence, cigarettes, uncomplicated: Secondary | ICD-10-CM | POA: Diagnosis not present

## 2014-11-22 DIAGNOSIS — J8489 Other specified interstitial pulmonary diseases: Secondary | ICD-10-CM | POA: Insufficient documentation

## 2014-11-22 DIAGNOSIS — J8410 Pulmonary fibrosis, unspecified: Secondary | ICD-10-CM

## 2014-11-22 DIAGNOSIS — I739 Peripheral vascular disease, unspecified: Secondary | ICD-10-CM | POA: Diagnosis present

## 2014-11-22 DIAGNOSIS — I10 Essential (primary) hypertension: Secondary | ICD-10-CM | POA: Diagnosis not present

## 2014-11-22 HISTORY — DX: Sleep apnea, unspecified: G47.30

## 2014-11-22 HISTORY — PX: ILIAC ARTERY STENT: SHX1786

## 2014-11-22 HISTORY — PX: ABDOMINAL ANGIOGRAM: SHX5499

## 2014-11-22 HISTORY — DX: Chronic obstructive pulmonary disease, unspecified: J44.9

## 2014-11-22 HISTORY — PX: LOWER EXTREMITY ANGIOGRAM: SHX5508

## 2014-11-22 LAB — POCT ACTIVATED CLOTTING TIME
ACTIVATED CLOTTING TIME: 134 s
Activated Clotting Time: 227 seconds
Activated Clotting Time: 239 seconds

## 2014-11-22 LAB — GLUCOSE, CAPILLARY
Glucose-Capillary: 112 mg/dL — ABNORMAL HIGH (ref 70–99)
Glucose-Capillary: 130 mg/dL — ABNORMAL HIGH (ref 70–99)
Glucose-Capillary: 108 mg/dL — ABNORMAL HIGH (ref 70–99)

## 2014-11-22 SURGERY — ANGIOGRAM, LOWER EXTREMITY
Anesthesia: LOCAL | Laterality: Right

## 2014-11-22 MED ORDER — SODIUM CHLORIDE 0.9 % IV SOLN
INTRAVENOUS | Status: AC
Start: 2014-11-22 — End: 2014-11-22
  Administered 2014-11-22: 10:00:00 via INTRAVENOUS

## 2014-11-22 MED ORDER — MORPHINE SULFATE 2 MG/ML IJ SOLN
1.0000 mg | INTRAMUSCULAR | Status: DC | PRN
  Administered 2014-11-22: 1 mg via INTRAVENOUS
  Filled 2014-11-22: qty 1

## 2014-11-22 MED ORDER — HYDRALAZINE HCL 20 MG/ML IJ SOLN
10.0000 mg | INTRAMUSCULAR | Status: DC | PRN
  Administered 2014-11-22: 10 mg via INTRAVENOUS
  Filled 2014-11-22: qty 1

## 2014-11-22 MED ORDER — HEPARIN (PORCINE) IN NACL 2-0.9 UNIT/ML-% IJ SOLN
INTRAMUSCULAR | Status: AC
  Filled 2014-11-22: qty 500

## 2014-11-22 MED ORDER — ASPIRIN EC 325 MG PO TBEC: 325.0000 mg | DELAYED_RELEASE_TABLET | Freq: Every day | ORAL | Status: DC

## 2014-11-22 MED ORDER — NITROGLYCERIN 1 MG/10 ML FOR IR/CATH LAB
INTRA_ARTERIAL | Status: AC
  Filled 2014-11-22: qty 10

## 2014-11-22 MED ORDER — METOPROLOL TARTRATE 25 MG PO TABS
25.0000 mg | ORAL_TABLET | Freq: Two times a day (BID) | ORAL | Status: DC
  Administered 2014-11-22 – 2014-11-23 (×3): 25 mg via ORAL
  Filled 2014-11-22 (×3): qty 1

## 2014-11-22 MED ORDER — ACETAMINOPHEN 325 MG PO TABS: 650.0000 mg | ORAL_TABLET | ORAL | Status: DC | PRN

## 2014-11-22 MED ORDER — ASPIRIN 81 MG PO CHEW
CHEWABLE_TABLET | ORAL | Status: AC
  Filled 2014-11-22: qty 1

## 2014-11-22 MED ORDER — ONDANSETRON HCL 4 MG/2ML IJ SOLN: 4.0000 mg | Freq: Four times a day (QID) | INTRAMUSCULAR | Status: DC | PRN

## 2014-11-22 MED ORDER — LIDOCAINE HCL (PF) 1 % IJ SOLN
INTRAMUSCULAR | Status: AC
  Filled 2014-11-22: qty 30

## 2014-11-22 MED ORDER — CLOPIDOGREL BISULFATE 300 MG PO TABS
ORAL_TABLET | ORAL | Status: AC
  Filled 2014-11-22: qty 1

## 2014-11-22 MED ORDER — HEPARIN SODIUM (PORCINE) 1000 UNIT/ML IJ SOLN
INTRAMUSCULAR | Status: AC
  Filled 2014-11-22: qty 1

## 2014-11-22 MED ORDER — ASPIRIN 81 MG PO CHEW
81.0000 mg | CHEWABLE_TABLET | ORAL | Status: AC
  Administered 2014-11-22: 81 mg via ORAL

## 2014-11-22 MED ORDER — ASPIRIN EC 325 MG PO TBEC
325.0000 mg | DELAYED_RELEASE_TABLET | Freq: Every day | ORAL | Status: DC
  Administered 2014-11-23: 325 mg via ORAL
  Filled 2014-11-22: qty 1

## 2014-11-22 MED ORDER — LISINOPRIL 40 MG PO TABS
40.0000 mg | ORAL_TABLET | Freq: Every day | ORAL | Status: DC
  Administered 2014-11-22 – 2014-11-23 (×2): 40 mg via ORAL
  Filled 2014-11-22 (×2): qty 1

## 2014-11-22 MED ORDER — SODIUM CHLORIDE 0.9 % IJ SOLN: 3.0000 mL | INTRAMUSCULAR | Status: DC | PRN

## 2014-11-22 MED ORDER — ASPIRIN 81 MG PO CHEW: 243.0000 mg | CHEWABLE_TABLET | Freq: Every day | ORAL | Status: DC

## 2014-11-22 MED ORDER — SODIUM CHLORIDE 0.9 % IV SOLN
INTRAVENOUS | Status: DC
  Administered 2014-11-22: 07:00:00 via INTRAVENOUS

## 2014-11-22 MED ORDER — HYDRALAZINE HCL 20 MG/ML IJ SOLN
INTRAMUSCULAR | Status: AC
  Filled 2014-11-22: qty 1

## 2014-11-22 MED ORDER — CLOPIDOGREL BISULFATE 75 MG PO TABS
75.0000 mg | ORAL_TABLET | Freq: Every day | ORAL | Status: DC
  Administered 2014-11-23: 10:00:00 75 mg via ORAL
  Filled 2014-11-22: qty 1

## 2014-11-22 MED ORDER — MORPHINE SULFATE 2 MG/ML IJ SOLN
2.0000 mg | Freq: Once | INTRAMUSCULAR | Status: AC
Start: 2014-11-22 — End: 2014-11-22
  Administered 2014-11-22: 2 mg via INTRAVENOUS
  Filled 2014-11-22: qty 1

## 2014-11-22 MED ORDER — HEPARIN (PORCINE) IN NACL 2-0.9 UNIT/ML-% IJ SOLN
INTRAMUSCULAR | Status: AC
  Filled 2014-11-22: qty 1000

## 2014-11-22 MED ORDER — MOMETASONE FURO-FORMOTEROL FUM 100-5 MCG/ACT IN AERO
2.0000 | INHALATION_SPRAY | Freq: Two times a day (BID) | RESPIRATORY_TRACT | Status: DC
  Administered 2014-11-22 – 2014-11-23 (×2): 2 via RESPIRATORY_TRACT
  Filled 2014-11-22: qty 8.8

## 2014-11-22 NOTE — Progress Notes (Signed)
Site area: right groin  Site Prior to Removal:  Level 0  Pressure Applied For 20 MINUTES    Minutes Beginning at 1300  Manual:   Yes.    Patient Status During Pull:  stable  Post Pull Groin Site:  Level 0  Post Pull Instructions Given:  Yes.    Post Pull Pulses Present:  Yes.    Dressing Applied:  Yes.    Comments:   

## 2014-11-22 NOTE — Progress Notes (Signed)
Site area: left groin  Site Prior to Removal:  Level 0  Pressure Applied For 20 MINUTES    Minutes Beginning at 1430  Manual:   Yes.    Patient Status During Pull:  stable  Post Pull Groin Site:  Level 0  Post Pull Instructions Given:  Yes.    Post Pull Pulses Present:  Yes.    Dressing Applied:  Yes.    Comments:

## 2014-11-22 NOTE — CV Procedure (Addendum)
Bridget Montezuma is a 64 y.o. female    [id number] LOCATION:  FACILITY: [facility]  PHYSICIAN: Quay Burow, M.D. August 29, 1951   DATE OF PROCEDURE:  11/22/2014  DATE OF DISCHARGE:     PV Angiogram/Intervention    History obtained from chart review.Mrs. Reller is a 64 year old thin appearing single African-American female mother of 4 children . She was referred by Dr. Percival Spanish , her cardiologist for peripheral vascular evaluation. She has a history of myocardial infarction back in 1999 undergoing stenting of her RCA by Dr. Melvern Banker.her chronic risk factors are notable for continued tobacco abuse, treated hypertension and hyperlipidemia. She is complained of increasing dyspnea on exertion over the last 6 months as well as right calf claudication. Dr. Percival Spanish saw her and ordered a stress test. 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 She has had right renal artery stenting in the past as well as right external iliac artery stenting as well.   PROCEDURE DESCRIPTION:   The patient was brought to the second floor [facility] cath lab in the postabsorptive state. She was not premedicated . Her right and left groinsWere prepped and shaved in usual sterile fashion. Xylocaine 1% was used  for local anesthesia. A 5 French sheath was inserted into the left common femoral artery using standard Seldinger technique. A 5 French Patel catheter was placed at the level of the renal arteries. Abdominal aortography, bilateral iliac angiography with bifemoral runoff was performed using bolus chase digital subtraction stepup table technique. Visipaque dye was used for the entirety of the case. Retrograde aortic pressure was monitored during the case.   HEMODYNAMICS:    AO SYSTOLIC/AO DIASTOLIC: 128/78   Angiographic Data:   1: Abdominal aortogram-the right renal artery stent was occluded. The left renal artery was widely patent. The infrarenal  abdominal aorta was fluoroscopically calcified but free of obstructive disease.  2: Left lower extremity-occluded left SFA at the origin with reconstitution in the adductor canal profunda femoris collaterals. There was three-vessel runoff  3: Right lower extremity-95% ostial right common iliac artery stenosis, 70% distal right common iliac artery stenosis extending into the proximal portion of the previously placed stent, long diffusely diseased SFA in the 50% range with 63% segmental stenosis in the midportion. There was three-vessel runoff there was a 45 mm gradient across the origin of the right common iliac artery.  IMPRESSION:high-grade ostial right common iliac artery stenosis as well as moderate distal right common iliac artery stenosis. We will proceed with PTA and stenting using the ipsilateral approach.  Procedure Description:a 6 French bright tip sheath was inserted into the right common femoral artery. The patient received a total of 7000 units of heparin with an ending ACT of 239. Total contrast administered and the patient was 205 mL. Using a 0.35 cm Versicore  wire, a 5 mm x 2 cm short shaft balloon was used to predilate the origin of the right common iliac artery. Following this, a 8 mm x 18 mm long  Balloon expandable stent was then deployed at the origin of the right common iliac artery. There was approximately a 2 mm overhang into the distal abdominal aorta. Following this a pullback gradient was performed with a 5 French angiocatheter across the distal right common iliac artery after demonstration of 200 g of intra-arterial nitroglycerin which demonstrated a 35 mm gradient. Because of this this area was stented with an 8 mm x 4 cm long Cordis Smart Nitinol self  expanding stent and postdilated with a 7 mm x 2 cm balloon resulting in reduction of a 67% segmental stenosis to 0% residual. The bright tip sheath was exchanged for a short 6 French sheath and both were secured. The patient left  the lab in stable condition.  Final Impression: successful ostial and distal right common iliac artery stenting with a Balloon expandable stent in the ostium of the RCIA  and nitinol self-expanding stent in the mid with excellent angiographic result. The patient will be treated with double and triple therapy. 300 mg Plavix by mouth was given. She is to be removed and pressure held once the AST was documented to be less than 170. She'll be hydrated overnight and discharged home in the morning. We will get lower extremity or chill Doppler studies in our [facility] office next week and she will see mid-level provider back into the 3 weeks on the same day that I am in the office so that we can see the patient together.    Lorretta Harp MD, [facility] 11/22/2014 9:06 AM

## 2014-11-22 NOTE — H&P (Signed)
Christine Meyer  June 19, 1951  [id number]  Primary Physician Garnet Koyanagi, DO Primary Cardiologist: Lorretta Harp MD Renae Gloss   HPI: Christine Meyer is a 64 year old thin appearing single African-American female mother of 4 children who is accompanied by one of her daughters today who is a Marine scientist at [employer]. She was referred by Dr. Percival Spanish , her cardiologist for peripheral vascular evaluation. She has a history of myocardial infarction back in 1999 undergoing stenting of her RCA by Dr. Melvern Banker.her chronic risk factors are notable for continued tobacco abuse, treated hypertension and hyperlipidemia. She is complained of increasing dyspnea on exertion over the last 6 months as well as right calf claudication. Dr. Percival Spanish saw her and ordered a stress test. 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   Current Outpatient Prescriptions  Medication Sig Dispense Refill  . aspirin (BABY ASPIRIN) 81 MG chewable tablet Chew 81 mg by mouth daily. 3 tablets daily    . Calcium Carbonate (CALTRATE 600 PO) Take by mouth daily.     Marland Kitchen dextromethorphan-guaiFENesin (MUCINEX DM) 30-600 MG per 12 hr tablet Take 1 tablet by mouth 2 (two) times daily. 60 tablet 1  . lisinopril (PRINIVIL,ZESTRIL) 40 MG tablet Take 1 tablet (40 mg total) by mouth daily. 90 tablet 3  . loratadine (CLARITIN) 10 MG tablet Take 1 tablet (10 mg total) by mouth daily. 30 tablet 11  . lovastatin (MEVACOR) 40 MG tablet Take 1 tablet (40 mg total) by mouth at bedtime. 30 tablet 2  . metoprolol tartrate (LOPRESSOR) 25 MG tablet TAKE 1 TABLET BY MOUTH TWICE DAILY 60 tablet 5  . mometasone-formoterol (DULERA) 100-5 MCG/ACT AERO Inhale 2 puffs into the lungs 2 (two) times daily.    . Multiple Vitamins-Minerals (MULTIVITAL) tablet Take 1 tablet by mouth daily.     . niacin 500 MG CR capsule Take 500 mg by  mouth at bedtime.     . ranitidine (ZANTAC) 150 MG capsule Take 150 mg by mouth as needed.      No current facility-administered medications for this visit.    No Known Allergies  History   Social History  . Marital Status: Single    Spouse Name: N/A  . Number of Children: 4  . Years of Education: N/A   Occupational History  . Not on file.   Social History Main Topics  . Smoking status: Current Every Day Smoker -- 0.30 packs/day for 40 years  . Smokeless tobacco: Never Used     Comment: Trying to cut back.   . Alcohol Use: No  . Drug Use: Not on file  . Sexual Activity: No   Other Topics Concern  . Not on file   Social History Narrative   Lives at home with grandson.      Review of Systems: General: negative for chills, fever, night sweats or weight changes.  Cardiovascular: negative for chest pain, dyspnea on exertion, edema, orthopnea, palpitations, paroxysmal nocturnal dyspnea or shortness of breath Dermatological: negative for rash Respiratory: negative for cough or wheezing Urologic: negative for hematuria Abdominal: negative for nausea, vomiting, diarrhea, bright red blood per rectum, melena, or hematemesis Neurologic: negative for visual changes, syncope, or dizziness All other systems reviewed and are otherwise negative except as noted above.    Blood pressure 200/76, pulse 62, height 5\' 4"  (1.626 m), weight 155 lb 12.8 oz (70.67 kg).  General appearance: alert and no distress Neck: no adenopathy,  no JVD, supple, symmetrical, trachea midline, thyroid not enlarged, symmetric, no tenderness/mass/nodules and soft bilateral carotid bruits Lungs: clear to auscultation bilaterally Heart: regular rate and rhythm, S1, S2 normal, no murmur, click, rub or gallop Extremities: extremities normal, atraumatic, no cyanosis or edema and 1+ femoral pulses with soft bruits bilaterally  EKG not performed  today  ASSESSMENT AND PLAN:   Peripheral arterial disease Christine Meyer has a history of PAD. I have stented her right external iliac artery 02/12/06 (8 mm x 4 cm Smart Nitinol self expanding stent) as well as her right renal artery at that time. She had an occluded left SFA with 3 vessel runoff bilaterally.I re-intervened on her right renal artery for "in-stent restenosis 04/28/07. She's had progressive right calf claudication as well as dyspnea. Recent Dopplers revealed ABIs in the 0.4 range bilaterally with what is described as "inflow disease. I'm going to repeat aortoiliac Dopplers and arrange for her to undergo angiography and potential intervention.   Renal artery stenosis, native, bilateral History of bilateral renal artery stenosis status post right renal artery stenting by myself in 07 with restenting because of "in-stent restenosis in 08. At that time she was noted to have a 40-50% left renal artery stenosis. This will be reimaged at the time of angiography.       Lorretta Harp MD FACP,FACC,FAHA, FSCAI H & P will be scanned in.  Pt was reexamined and existing H & P reviewed. No changes found.  Lorretta Harp, MD [facility] 11/22/2014 7:42 AM

## 2014-11-23 ENCOUNTER — Other Ambulatory Visit: Payer: Self-pay | Admitting: Physician Assistant

## 2014-11-23 ENCOUNTER — Encounter (HOSPITAL_COMMUNITY): Payer: Self-pay | Admitting: Physician Assistant

## 2014-11-23 ENCOUNTER — Ambulatory Visit (HOSPITAL_COMMUNITY): Payer: Medicare Other

## 2014-11-23 DIAGNOSIS — I739 Peripheral vascular disease, unspecified: Secondary | ICD-10-CM

## 2014-11-23 DIAGNOSIS — Z7982 Long term (current) use of aspirin: Secondary | ICD-10-CM | POA: Diagnosis not present

## 2014-11-23 DIAGNOSIS — I10 Essential (primary) hypertension: Secondary | ICD-10-CM | POA: Diagnosis not present

## 2014-11-23 DIAGNOSIS — I70213 Atherosclerosis of native arteries of extremities with intermittent claudication, bilateral legs: Secondary | ICD-10-CM | POA: Diagnosis not present

## 2014-11-23 DIAGNOSIS — I252 Old myocardial infarction: Secondary | ICD-10-CM | POA: Diagnosis not present

## 2014-11-23 DIAGNOSIS — E785 Hyperlipidemia, unspecified: Secondary | ICD-10-CM | POA: Diagnosis not present

## 2014-11-23 DIAGNOSIS — F17210 Nicotine dependence, cigarettes, uncomplicated: Secondary | ICD-10-CM | POA: Diagnosis not present

## 2014-11-23 LAB — PULMONARY FUNCTION TEST
DL/VA % PRED: 71 %
DL/VA: 3.26 ml/min/mmHg/L
DLCO COR % PRED: 42 %
DLCO cor: 9.21 ml/min/mmHg
DLCO unc % pred: 38 %
DLCO unc: 8.31 ml/min/mmHg
FEF 25-75 POST: 0.94 L/s
FEF 25-75 Pre: 1.4 L/sec
FEF2575-%Change-Post: -32 %
FEF2575-%PRED-POST: 54 %
FEF2575-%PRED-PRE: 81 %
FEV1-%Change-Post: -6 %
FEV1-%Pred-Post: 95 %
FEV1-%Pred-Pre: 102 %
FEV1-Post: 1.7 L
FEV1-Pre: 1.82 L
FEV1FVC-%CHANGE-POST: 2 %
FEV1FVC-%PRED-PRE: 91 %
FEV6-%CHANGE-POST: -8 %
FEV6-%PRED-PRE: 115 %
FEV6-%Pred-Post: 105 %
FEV6-Post: 2.32 L
FEV6-Pre: 2.54 L
FEV6FVC-%Change-Post: 0 %
FEV6FVC-%Pred-Post: 104 %
FEV6FVC-%Pred-Pre: 103 %
FVC-%Change-Post: -8 %
FVC-%PRED-POST: 101 %
FVC-%Pred-Pre: 111 %
FVC-POST: 2.32 L
FVC-Pre: 2.55 L
POST FEV1/FVC RATIO: 73 %
PRE FEV6/FVC RATIO: 100 %
Post FEV6/FVC ratio: 100 %
Pre FEV1/FVC ratio: 71 %
RV % pred: 103 %
RV: 2.08 L
TLC % pred: 87 %
TLC: 4.16 L

## 2014-11-23 LAB — BASIC METABOLIC PANEL
ANION GAP: 6 (ref 5–15)
BUN: 15 mg/dL (ref 6–23)
CO2: 27 mmol/L (ref 19–32)
CREATININE: 1.1 mg/dL (ref 0.50–1.10)
Calcium: 8.9 mg/dL (ref 8.4–10.5)
Chloride: 107 mmol/L (ref 96–112)
GFR calc Af Amer: 60 mL/min — ABNORMAL LOW (ref >90)
GFR calc non Af Amer: 52 mL/min — ABNORMAL LOW (ref >90)
Glucose, Bld: 102 mg/dL — ABNORMAL HIGH (ref 70–99)
Potassium: 4.2 mmol/L (ref 3.5–5.1)
SODIUM: 140 mmol/L (ref 135–145)

## 2014-11-23 LAB — CBC
HCT: 32.1 % — ABNORMAL LOW (ref 36.0–46.0)
Hemoglobin: 10.6 g/dL — ABNORMAL LOW (ref 12.0–15.0)
MCH: 33.1 pg (ref 26.0–34.0)
MCHC: 33 g/dL (ref 30.0–36.0)
MCV: 100.3 fL — AB (ref 78.0–100.0)
PLATELETS: 242 10*3/uL (ref 150–400)
RBC: 3.2 MIL/uL — AB (ref 3.87–5.11)
RDW: 13.8 % (ref 11.5–15.5)
WBC: 5.5 10*3/uL (ref 4.0–10.5)

## 2014-11-23 LAB — GLUCOSE, CAPILLARY: Glucose-Capillary: 98 mg/dL (ref 70–99)

## 2014-11-23 MED ORDER — ALBUTEROL SULFATE (2.5 MG/3ML) 0.083% IN NEBU
2.5000 mg | INHALATION_SOLUTION | Freq: Once | RESPIRATORY_TRACT | Status: AC
  Administered 2014-11-23: 09:00:00 2.5 mg via RESPIRATORY_TRACT

## 2014-11-23 MED ORDER — ASPIRIN 81 MG PO CHEW: 81.0000 mg | CHEWABLE_TABLET | Freq: Every day | ORAL | Status: DC

## 2014-11-23 MED ORDER — CLOPIDOGREL BISULFATE 75 MG PO TABS: 75.0000 mg | ORAL_TABLET | Freq: Every day | ORAL | Status: DC

## 2014-11-23 NOTE — Discharge Summary (Signed)
CARDIOLOGY DISCHARGE SUMMARY   Patient ID: Christine Meyer MRN: [id number] DOB/AGE: Jun 01, 1951 64 y.o.  Admit date: 11/22/2014 Discharge date: 11/23/2014  PCP: Garnet Koyanagi, DO Primary Cardiologist: Dr. Gwenlyn Found  Primary Discharge Diagnosis:  Peripheral arterial disease with claudication  Secondary Discharge Diagnosis:  Hypertension Hyperlipidemia  Procedures: Abdominal aortogram, left lower extremity angiogram, right lower extremity angiogram, stenting of the ostial and distal right common iliac artery with a balloon expandable stent in the ostium and a self-expanding stent in the midportion  Hospital Course: Christine Meyer is a 64 y.o. female with a history of CAD. She was referred for peripheral vascular evaluation. She was evaluated by Dr. Gwenlyn Found and peripheral vascular catheterization was recommended. She came to the hospital for the procedure on 11/22/2014.  Procedure results are below. She was noted to have high-grade ostial right common iliac artery stenosis as well as moderate distal right common iliac artery stenosis. She had PTCA and stenting which was successful. She tolerated the procedure well.  On 11/23/2014, she was seen by Dr. Ellyn Hack and all data were reviewed. She was doing well and having no difficulties. No further inpatient workup is indicated and she is considered stable for discharge, to follow up as an outpatient.  BP 141/46 mmHg  Pulse 80  Temp(Src) 98.3 F (36.8 C) (Oral)  Resp 20  Ht 5\' 3"  (1.6 m)  Wt 158 lb 11.7 oz (72 kg)  BMI 28.13 kg/m2  SpO2 95% General: Well developed, well nourished, female in no acute distress Head: Eyes PERRLA, No xanthomas.   Normocephalic and atraumatic  Lungs: Clear bilaterally to auscultation. Heart: HRRR S1 S2, without RG. Soft systolic murmur noted. No JVD.  Abdomen: Bowel sounds are present, abdomen soft and non-tender without masses or  hernias noted. Msk: Normal strength and tone for age. Extremities: No  clubbing, cyanosis or edema. Bilateral femoral bruits, no hematoma at bilateral cath sites.  Skin:  No rashes or lesions noted. Neuro: Alert and oriented X 3. Psych:  Good affect, responds appropriately   Labs:   Lab Results  Component Value Date   WBC 5.5 11/23/2014   HGB 10.6* 11/23/2014   HCT 32.1* 11/23/2014   MCV 100.3* 11/23/2014   PLT 242 11/23/2014     Recent Labs Lab 11/23/14 0410  NA 140  K 4.2  CL 107  CO2 27  BUN 15  CREATININE 1.10  CALCIUM 8.9  GLUCOSE 102*   Peripheral Vascular Cath: 11/22/2014 Final Impression: successful ostial and distal right common iliac artery stenting with a balloon expandable stent in the ostial and nitinol self-expanding stent in the mid with excellent angiographic result. The patient will be treated with double antiplatelet therapy. 300 mg Plavix by mouth was given. She is to be removed and pressure held once the AST was documented to be less than 170. She'll be hydrated overnight and discharged home in the morning. We will get lower extremity arterial Doppler studies in our [facility] office next week and she will see mid-level provider back into the 3 weeks on the same day that I am in the office so that we can see the patient together.  FOLLOW UP PLANS AND APPOINTMENTS No Known Allergies   Medication List    TAKE these medications        aspirin 81 MG chewable tablet  Chew 1 tablet (81 mg total) by mouth daily.     CALTRATE 600 PO  Take 1 tablet by mouth daily.     clopidogrel  75 MG tablet  Commonly known as:  PLAVIX  Take 1 tablet (75 mg total) by mouth daily with breakfast.     IRON PO  Take 1 tablet by mouth daily.     lisinopril 40 MG tablet  Commonly known as:  PRINIVIL,ZESTRIL  Take 1 tablet (40 mg total) by mouth daily.     lovastatin 40 MG tablet  Commonly known as:  MEVACOR  Take 1 tablet (40 mg total) by mouth at bedtime.     metoprolol tartrate 25 MG tablet  Commonly known as:  LOPRESSOR  TAKE 1 TABLET  BY MOUTH TWICE DAILY     mometasone-formoterol 100-5 MCG/ACT Aero  Commonly known as:  DULERA  Inhale 2 puffs into the lungs 2 (two) times daily.     MULTIVITAL tablet  Take 1 tablet by mouth daily.     niacin 500 MG CR capsule  Take 500 mg by mouth at bedtime.     VITAMIN D3 PO  Take 1 tablet by mouth daily.         Follow-up Information    Follow up with Lorretta Harp, MD.   Specialty:  Cardiology   Why:  The office will call.   Contact information:   [address] [place] [address] [phone]       BRING ALL MEDICATIONS WITH YOU TO FOLLOW UP APPOINTMENTS  Time spent with patient to include physician time: 38 min Signed: Rosaria Ferries, PA-C 11/23/2014, 9:58 AM Co-Sign MD  Agree with note by Rosaria Ferries PA-C  S/P RCIA PTA/stent with Balloon explandable stent at Ostium and Nitinol self expanding stent distal RCIA. Excellent angiographic result. Both groins OK. Labs OK. Had PFTs this AM per her Pulm MD. OK for DC home. Will need LEAs at [facility] next week followed by ROV with [facility] 2-3 weeks on day that I'm in the office. DAPT.    Lorretta Harp, M.D., [place], [facility], [place] [address] [place], [place]  [address]  [phone] 11/23/2014 11:05 AM

## 2014-12-03 ENCOUNTER — Encounter: Payer: Self-pay | Admitting: *Deleted

## 2014-12-06 ENCOUNTER — Ambulatory Visit (HOSPITAL_COMMUNITY)
Admission: RE | Admit: 2014-12-06 | Discharge: 2014-12-06 | Disposition: A | Discharge status: Home / Self Care | Payer: Medicare Other | Source: Ambulatory Visit | Attending: Cardiovascular Disease | Admitting: Cardiovascular Disease

## 2014-12-06 ENCOUNTER — Telehealth: Payer: Self-pay

## 2014-12-06 DIAGNOSIS — I739 Peripheral vascular disease, unspecified: Secondary | ICD-10-CM | POA: Diagnosis not present

## 2014-12-06 NOTE — Progress Notes (Signed)
Arterial Duplex Lower Ext. Right Completed. Oda Cogan, BS, RDMS, RVT

## 2014-12-06 NOTE — Telephone Encounter (Signed)
Letter completed and Christine Meyer has been made aware.      KP

## 2014-12-06 NOTE — Telephone Encounter (Signed)
Ok to give letter since she was just in hospital

## 2014-12-06 NOTE — Telephone Encounter (Addendum)
  Regarding: Jury duty Can Dr. Etter Sjogren write an excuse note for Christine Meyer duty  Christine Meyer duty date 01/17/15 Juror # [id number]    Please advise       KP

## 2014-12-08 ENCOUNTER — Other Ambulatory Visit: Payer: Self-pay

## 2014-12-08 ENCOUNTER — Telehealth: Payer: Self-pay | Admitting: Pulmonary Disease

## 2014-12-08 MED ORDER — MULTIVITAL PO TABS: 1.0000 | ORAL_TABLET | Freq: Every day | ORAL | Status: DC

## 2014-12-08 MED ORDER — VITAMIN D3 25 MCG (1000 UNIT) PO TABS: 1000.0000 [IU] | ORAL_TABLET | Freq: Every day | ORAL | Status: DC

## 2014-12-08 NOTE — Telephone Encounter (Signed)
Samples have been taken down stairs to [facility]. Nothing further was needed.

## 2014-12-13 ENCOUNTER — Encounter: Payer: Self-pay | Admitting: *Deleted

## 2014-12-13 DIAGNOSIS — B370 Candidal stomatitis: Secondary | ICD-10-CM | POA: Diagnosis not present

## 2014-12-13 DIAGNOSIS — R5383 Other fatigue: Secondary | ICD-10-CM | POA: Diagnosis not present

## 2014-12-13 DIAGNOSIS — R768 Other specified abnormal immunological findings in serum: Secondary | ICD-10-CM | POA: Diagnosis not present

## 2014-12-13 DIAGNOSIS — M19049 Primary osteoarthritis, unspecified hand: Secondary | ICD-10-CM | POA: Diagnosis not present

## 2014-12-13 DIAGNOSIS — J84112 Idiopathic pulmonary fibrosis: Secondary | ICD-10-CM | POA: Diagnosis not present

## 2014-12-13 DIAGNOSIS — I739 Peripheral vascular disease, unspecified: Secondary | ICD-10-CM | POA: Diagnosis not present

## 2014-12-21 ENCOUNTER — Ambulatory Visit (INDEPENDENT_AMBULATORY_CARE_PROVIDER_SITE_OTHER): Payer: Medicare Other | Admitting: Cardiovascular Disease

## 2014-12-21 ENCOUNTER — Encounter: Payer: Self-pay | Admitting: Cardiology

## 2014-12-21 VITALS — BP 126/60 | HR 66 | Ht 63.0 in | Wt 159.7 lb

## 2014-12-21 DIAGNOSIS — Z9889 Other specified postprocedural states: Secondary | ICD-10-CM

## 2014-12-21 DIAGNOSIS — I701 Atherosclerosis of renal artery: Secondary | ICD-10-CM

## 2014-12-21 DIAGNOSIS — Z95828 Presence of other vascular implants and grafts: Secondary | ICD-10-CM

## 2014-12-21 NOTE — Progress Notes (Signed)
   Patient ID: Christine Meyer, female    DOB: 1951/01/27, 64 y.o.   MRN: [id number]  HPI    Review of Systems    Physical Exam

## 2014-12-21 NOTE — Progress Notes (Signed)
12/21/2014 Christine Meyer   August 11, 1951  [id number]  Primary Physician Garnet Koyanagi, DO Primary Cardiologist: Dr. Percival Spanish Dr. Gwenlyn Found  Reason for Visit/CC: [facility] F/U for PVD  HPI:  Christine Meyer is a 64 y.o. female with a history of CAD s/p MI in 1999 resulting in stenting of her RCA. Her other history is significant for HTN, HLD tobacco use and recent diagnosis of PVD with right LE claudication. She was referred to Dr. Gwenlyn Found by Dr. Percival Spanish for evaluation. She was evaluated by Dr. Gwenlyn Found and LE doppler study demonstrated reduced right sided ABI at 0.43. Peripheral vascular catheterization was recommended. She was admitted to [facility] for the procedure on 11/22/2014.  She was noted to have high-grade ostial right common iliac artery stenosis as well as moderate distal right common iliac artery stenosis. She had PTCA and stenting which was successful. The left lower extremity also demonstrated an occluded left SFA at the origin with reconstitution in the adductor canal profunda femoris collaterals. There was three-vessel runoff. She tolerated the procedure well and was discharged home on DAPT with ASA + Plavix.  She presents back for post hospital f/u. She notes significant improvement. No further claudication. She denies any complication involving her femoral access sites. Ambulating w/o difficulty. She reports full compliance with ASA + Plavix. No abnormal bleeding. Unfortunately she continues to smoke. She denies CP.   Repeat LE arterial dopplers demonstrated notable improvement in right ABI from 0.43 to 0.61.   Current Outpatient Prescriptions  Medication Sig Dispense Refill  . aspirin 81 MG chewable tablet Chew 1 tablet (81 mg total) by mouth daily.    . Calcium Carbonate (CALTRATE 600 PO) Take 1 tablet by mouth daily.     . cholecalciferol (VITAMIN D) 1000 UNITS tablet Take 1 tablet (1,000 Units total) by mouth daily. 90 tablet 3  . clopidogrel (PLAVIX) 75 MG tablet Take 1 tablet  (75 mg total) by mouth daily with breakfast. 30 tablet 11  . IRON PO Take 1 tablet by mouth daily.    Marland Kitchen lisinopril (PRINIVIL,ZESTRIL) 40 MG tablet Take 1 tablet (40 mg total) by mouth daily. 90 tablet 3  . lovastatin (MEVACOR) 40 MG tablet Take 1 tablet (40 mg total) by mouth at bedtime. 30 tablet 2  . metoprolol tartrate (LOPRESSOR) 25 MG tablet TAKE 1 TABLET BY MOUTH TWICE DAILY 60 tablet 5  . mometasone-formoterol (DULERA) 100-5 MCG/ACT AERO Inhale 2 puffs into the lungs 2 (two) times daily. 8.8 g 5  . Multiple Vitamins-Minerals (MULTIVITAL) tablet Take 1 tablet by mouth daily. Centrum Silver 90 tablet 3  . niacin 500 MG CR capsule Take 500 mg by mouth at bedtime.       No current facility-administered medications for this visit.    No Known Allergies  History   Social History  . Marital Status: Single    Spouse Name: N/A  . Number of Children: 4  . Years of Education: N/A   Occupational History  . Not on file.   Social History Main Topics  . Smoking status: Current Every Day Smoker -- 0.25 packs/day for 55 years    Types: Cigarettes  . Smokeless tobacco: Never Used  . Alcohol Use: No  . Drug Use: No  . Sexual Activity: Yes   Other Topics Concern  . Not on file   Social History Narrative   Lives at home with grandson.      Review of Systems: General: negative for chills, fever, night sweats or weight  changes.  Cardiovascular: negative for chest pain, dyspnea on exertion, edema, orthopnea, palpitations, paroxysmal nocturnal dyspnea or shortness of breath Dermatological: negative for rash Respiratory: negative for cough or wheezing Urologic: negative for hematuria Abdominal: negative for nausea, vomiting, diarrhea, bright red blood per rectum, melena, or hematemesis Neurologic: negative for visual changes, syncope, or dizziness All other systems reviewed and are otherwise negative except as noted above.    Blood pressure 126/60, pulse 66, height 5\' 3"  (1.6 m),  weight 159 lb 11.2 oz (72.439 kg).  General appearance: alert, cooperative and no distress Neck: no carotid bruit and no JVD Lungs: clear to auscultation bilaterally Heart: regular rate and rhythm, S1, S2 normal, no murmur, click, rub or gallop Extremities: no LEE Pulses: 2+ and symmetric Skin: warm and dry Neurologic: Grossly normal  EKG not performed  ASSESSMENT AND PLAN:   1. PVD: s/p right common illiac stenting with improvement in ABI from 0.43 to 0.61. Claudication resolved. Continue ASA + Plavix. Smoking cessation advised. Repeat Dopplers in 6 months.  2. Claudication: resolved after PV intervention.   3. Tobacco Abuse: smoking cessation strongly advised.   4. HTN: Well controlled at 126/60. Continue current regimen.   5. HLD: continue statin.  6. CAD: stable w/o angina. Continue ASA, Plavix, metoprolol, lisinopril and lovastatin.    PLAN  F/U with Dr. Gwenlyn Found in 6 months. Repeat dopplers in 6 months.   Arvilla Market 12/21/2014 8:52 AM Agree with note written by Ellen Henri  [facility]  Patient is status post stenting of her right common and external iliac artery. Her symptoms have improved. Follow-up Dopplers have improved as well. She does have a total left SFA with two-vessel runoff and moderately high-grade segmental right SFA disease with asymptomatic from this. We will continue to follow her by duplex ultrasound on a semiannual basis.  Lorretta Harp 12/21/2014 5:16 PM

## 2014-12-21 NOTE — Patient Instructions (Signed)
Your physician recommends that you schedule a follow-up appointment in: 6 months with Dr. Gwenlyn Found  Continue to all your meds as you are taking them now  We are going to repeat your lower extremity dopplers in 6 months

## 2015-03-07 ENCOUNTER — Ambulatory Visit (INDEPENDENT_AMBULATORY_CARE_PROVIDER_SITE_OTHER): Payer: Medicare Other | Admitting: Pulmonary Disease

## 2015-03-07 ENCOUNTER — Encounter: Payer: Self-pay | Admitting: Pulmonary Disease

## 2015-03-07 VITALS — BP 152/62 | HR 66 | Ht 63.5 in | Wt 153.0 lb

## 2015-03-07 DIAGNOSIS — J8410 Pulmonary fibrosis, unspecified: Secondary | ICD-10-CM

## 2015-03-07 DIAGNOSIS — I701 Atherosclerosis of renal artery: Secondary | ICD-10-CM

## 2015-03-07 NOTE — Assessment & Plan Note (Addendum)
She has usual interstitial pneumonitis with emphysema and no evidence of a connective tissue disease. Her rheumatoid factor was slightly elevated but she had no history or exam findings worrisome for a connective tissue disease by my exam and this was confirmed after her visit with rheumatology.  I explained to her daughter and to the patient today at length that she has idiopathic pulmonary fibrosis. This condition invariably progresses over time. The only known treatment just slows the progress and does not improve the symptoms. We discussed the risks and benefits of treatment at length today. They prefer to think about this at this time.  I also explained to them that the only chance for a cure is lung transplantation.  Plan: Repeat pulmonary function testing in September with 6 minute walk They are going to think about pirfenidone,Ofev is not an option because of her history of coronary artery disease We will see her back in September Will likely make a referral to [place] lung transplant in 6-12 months

## 2015-03-07 NOTE — Assessment & Plan Note (Signed)
Stable interval and tolerating Dulera without difficulty.  Plan: Continue Dulera  Greater than 25 minutes total in 3 questions today in clinic

## 2015-03-07 NOTE — Patient Instructions (Addendum)
We will order lung function testing in September and we'll see you after that visit Please read the information packet we provided you about pirfenidone and let us know if you want to take the medication Keep taking the Symbicort as you're doing Follow the acid reflux lifestyle modification sheet Take over-the-counter Prilosec daily We will see you back in September

## 2015-03-07 NOTE — Progress Notes (Signed)
Subjective:    Patient ID: Christine Meyer, female    DOB: 1951-02-24, 64 y.o.   MRN: [id number]  Synopsis: Christine Meyer was referred to the [facility] in 2016 for evaluation of shortness of breath. She had a CT chest performed which showed evidence of usual interstitial pneumonitis as well as centrilobular emphysema. She has a significant smoking history. 12 2015 CT chest> no pulmonary embolism, moderate centrilobular emphysema, there is interstitial septal thickening as well as honeycombing in a patchy and peripheral distribution with a clear craniocaudal gradient. This is consistent with usual interstitial pneumonitis.  10/2014 ANA, Aldolase, CCP, SCL 70, SSA/SSB, Jo-1 all negative; however ESR 40, RF 17 (both elevated)  HPI Chief Complaint  Patient presents with  . Follow-up    Pt c/o sometimes prod cough with white mucus worse qhs X1-2 mos.      Christine Meyer saw rheumatology and was told that she did not have evidence of a connective tissue disease. She continues to have a cough which is sometimes productive of mucus. She continues to take [facility] twice a day and she says that this has been helping her breathing. She is rinsing her mouth afterwards. She's not had trouble from the medication. Her shortness of breath is about the same since the last visit. No chest pain or leg swelling.  Past Medical History  Diagnosis Date  . Anemia     NOS  . Hypertension   . Hyperlipidemia   . CAD (coronary artery disease)     Stent RCA 1999  . PVD (peripheral vascular disease)     Carotid stenosis, renal artery stenosis  . COPD (chronic obstructive pulmonary disease)   . Sleep apnea     dx'd but doesn't use mask   . Diabetes mellitus, type 2     Diet controlled  . Uterine cancer ?1980's  . OSA on CPAP       Review of Systems  Constitutional: Negative for fever, chills and fatigue.  HENT: Negative for postnasal drip, rhinorrhea and sinus pressure.   Respiratory: Positive for shortness  of breath. Negative for cough and wheezing.   Cardiovascular: Negative for chest pain, palpitations and leg swelling.       Objective:   Physical Exam Filed Vitals:   03/07/15 1210  BP: 152/62  Pulse: 66  Height: 5' 3.5" (1.613 m)  Weight: 153 lb (69.4 kg)  SpO2: 99%   RA  Gen: well appearing, no acute distress HEENT: NCAT,  EOMi, OP clear, PULM: Crackles in bases, otherwise clear CV: RRR, no mgr, no JVD AB: BS+, soft, nontender Ext: warm, no edema, no clubbing, no cyanosis Derm: no rash or skin breakdown Neuro: A&Ox4, MAEW  Rheumatology records were reviewed showing no evidence of a connective tissue disease April 2016 cardiology visit reviewed where she had stable symptoms and no evidence of progressive coronary artery disease     Assessment & Plan:   Usual interstitial pneumonitis She has usual interstitial pneumonitis with emphysema and no evidence of a connective tissue disease. Her rheumatoid factor was slightly elevated but she had no history or exam findings worrisome for a connective tissue disease by my exam and this was confirmed after her visit with rheumatology.  I explained to her daughter and to the patient today at length that she has idiopathic pulmonary fibrosis. This condition invariably progresses over time. The only known treatment just slows the progress and does not improve the symptoms. We discussed the risks and benefits of treatment at length  today. They prefer to think about this at this time.  I also explained to them that the only chance for a cure is lung transplantation.  Plan: Repeat pulmonary function testing in September with 6 minute walk They are going to think about pirfenidone,Ofev is not an option because of her history of coronary artery disease We will see her back in September Will likely make a referral to [facility] lung transplant in 6-12 months   Centrilobular emphysema Stable interval and tolerating Dulera without  difficulty.  Plan: Continue Dulera  Greater than 25 minutes total in 3 questions today in clinic    Updated Medication List Outpatient Encounter Prescriptions as of 03/07/2015  Medication Sig  . aspirin 81 MG chewable tablet Chew 1 tablet (81 mg total) by mouth daily.  . Calcium Carbonate (CALTRATE 600 PO) Take 1 tablet by mouth daily.   . cholecalciferol (VITAMIN D) 1000 UNITS tablet Take 1 tablet (1,000 Units total) by mouth daily.  . clopidogrel (PLAVIX) 75 MG tablet Take 1 tablet (75 mg total) by mouth daily with breakfast.  . IRON PO Take 1 tablet by mouth daily.  Marland Kitchen lisinopril (PRINIVIL,ZESTRIL) 40 MG tablet Take 1 tablet (40 mg total) by mouth daily.  Marland Kitchen lovastatin (MEVACOR) 40 MG tablet Take 1 tablet (40 mg total) by mouth at bedtime.  . metoprolol tartrate (LOPRESSOR) 25 MG tablet TAKE 1 TABLET BY MOUTH TWICE DAILY  . mometasone-formoterol (DULERA) 100-5 MCG/ACT AERO Inhale 2 puffs into the lungs 2 (two) times daily.  . Multiple Vitamins-Minerals (MULTIVITAL) tablet Take 1 tablet by mouth daily. Centrum Silver  . niacin 500 MG CR capsule Take 500 mg by mouth at bedtime.     No facility-administered encounter medications on file as of 03/07/2015.

## 2015-03-10 ENCOUNTER — Encounter: Payer: Self-pay | Admitting: Family Medicine

## 2015-03-10 ENCOUNTER — Ambulatory Visit (INDEPENDENT_AMBULATORY_CARE_PROVIDER_SITE_OTHER): Payer: Medicare Other | Admitting: Family Medicine

## 2015-03-10 VITALS — BP 140/62 | HR 72 | Temp 98.1°F | Ht 64.0 in | Wt 153.2 lb

## 2015-03-10 DIAGNOSIS — I701 Atherosclerosis of renal artery: Secondary | ICD-10-CM | POA: Diagnosis not present

## 2015-03-10 DIAGNOSIS — E1159 Type 2 diabetes mellitus with other circulatory complications: Secondary | ICD-10-CM | POA: Diagnosis not present

## 2015-03-10 DIAGNOSIS — E1151 Type 2 diabetes mellitus with diabetic peripheral angiopathy without gangrene: Secondary | ICD-10-CM

## 2015-03-10 DIAGNOSIS — I10 Essential (primary) hypertension: Secondary | ICD-10-CM

## 2015-03-10 DIAGNOSIS — E785 Hyperlipidemia, unspecified: Secondary | ICD-10-CM

## 2015-03-10 LAB — BASIC METABOLIC PANEL
BUN: 15 mg/dL (ref 6–23)
CO2: 26 meq/L (ref 19–32)
Calcium: 9.5 mg/dL (ref 8.4–10.5)
Chloride: 109 mEq/L (ref 96–112)
Creatinine, Ser: 1.06 mg/dL (ref 0.40–1.20)
GFR: 66.71 mL/min (ref >60.00)
Glucose, Bld: 85 mg/dL (ref 70–99)
Potassium: 4.2 mEq/L (ref 3.5–5.1)
SODIUM: 141 meq/L (ref 135–145)

## 2015-03-10 LAB — HEPATIC FUNCTION PANEL
ALT: 15 U/L (ref 0–35)
AST: 22 U/L (ref 0–37)
Albumin: 3.9 g/dL (ref 3.5–5.2)
Alkaline Phosphatase: 74 U/L (ref 39–117)
Bilirubin, Direct: 0 mg/dL (ref 0.0–0.3)
Total Bilirubin: 0.3 mg/dL (ref 0.2–1.2)
Total Protein: 7.4 g/dL (ref 6.0–8.3)

## 2015-03-10 LAB — POCT URINALYSIS DIPSTICK
BILIRUBIN UA: NEGATIVE
Blood, UA: NEGATIVE
Glucose, UA: NEGATIVE
KETONES UA: NEGATIVE
LEUKOCYTES UA: NEGATIVE
Nitrite, UA: NEGATIVE
PROTEIN UA: NEGATIVE
Spec Grav, UA: 1.025
UROBILINOGEN UA: 0.2
pH, UA: 6

## 2015-03-10 LAB — HEMOGLOBIN A1C: HEMOGLOBIN A1C: 5.9 % (ref 4.6–6.5)

## 2015-03-10 LAB — LIPID PANEL
Cholesterol: 167 mg/dL (ref 0–200)
HDL: 48.8 mg/dL (ref >39.00)
LDL Cholesterol: 99 mg/dL (ref 0–99)
NonHDL: 118.2
Total CHOL/HDL Ratio: 3
Triglycerides: 95 mg/dL (ref 0.0–149.0)
VLDL: 19 mg/dL (ref 0.0–40.0)

## 2015-03-10 LAB — MICROALBUMIN / CREATININE URINE RATIO
Creatinine,U: 206.5 mg/dL
Microalb Creat Ratio: 0.3 mg/g (ref 0.0–30.0)

## 2015-03-10 MED ORDER — LOSARTAN POTASSIUM 100 MG PO TABS
100.0000 mg | ORAL_TABLET | Freq: Every day | ORAL | Status: DC
Start: 2015-03-10 — End: 2016-01-07

## 2015-03-10 MED ORDER — GLUCOSE BLOOD VI STRP: ORAL_STRIP | Status: DC

## 2015-03-10 NOTE — Assessment & Plan Note (Signed)
Diet controlled. Check labs.  

## 2015-03-10 NOTE — Assessment & Plan Note (Signed)
con't lovastatin and check labs

## 2015-03-10 NOTE — Patient Instructions (Signed)

## 2015-03-10 NOTE — Progress Notes (Signed)
Patient ID: Christine Meyer, female    DOB: 03-May-1951  Age: 64 y.o. MRN: [id number]    Subjective:  Subjective HPI Christine Meyer presents for f/u dm, cholesterol and htn.  No complaints.  HYPERTENSION  Blood pressure range-running high at home per pt  Chest pain- no      Dyspnea- no Lightheadedness- no   Edema- no Other side effects - no   Medication compliance: good Low salt diet- yes  DIABETES  Blood Sugar ranges-not checkint   Polyuria- no New Visual problems- no Hypoglycemic symptoms- no Other side effects-no Medication compliance - good Last eye exam- due Foot exam- today  HYPERLIPIDEMIA  Medication compliance- good RUQ pain- no  Muscle aches- no Other side effects-no   Review of Systems  Constitutional: Negative for diaphoresis, appetite change, fatigue and unexpected weight change.  Eyes: Negative for pain, redness and visual disturbance.  Respiratory: Negative for cough, chest tightness, shortness of breath and wheezing.   Cardiovascular: Negative for chest pain, palpitations and leg swelling.  Endocrine: Negative for cold intolerance, heat intolerance, polydipsia, polyphagia and polyuria.  Genitourinary: Negative for dysuria, frequency and difficulty urinating.  Neurological: Positive for dizziness and light-headedness. Negative for numbness and headaches.       Dizziness may be due to low blood sugar but she is not checking her sugar    History Past Medical History  Diagnosis Date  . Anemia     NOS  . Hypertension   . Hyperlipidemia   . CAD (coronary artery disease)     Stent RCA 1999  . PVD (peripheral vascular disease)     Carotid stenosis, renal artery stenosis  . COPD (chronic obstructive pulmonary disease)   . Sleep apnea     dx'd but doesn't use mask   . Diabetes mellitus, type 2     Diet controlled  . Uterine cancer ?1980's  . OSA on CPAP     She has past surgical history that includes Renal artery stent (Right, May 2007; 08/2006);  Coronary angioplasty with stent (1999); Colonoscopy (2000); Iliac artery stent (Right, 11/22/2014); Total abdominal hysterectomy (?1980's); lower extremity angiogram (N/A, 11/22/2014); and abdominal angiogram (11/22/2014).   Her family history includes Deep vein thrombosis in her mother; Diabetes in her brother, maternal aunt, maternal grandmother, mother, and sister; Emphysema in her brother; Heart attack in her paternal aunt; Heart failure in her mother; Hypertension in her brother, father, and mother.She reports that she has been smoking Cigarettes.  She has a 13.75 pack-year smoking history. She has never used smokeless tobacco. She reports that she does not drink alcohol or use illicit drugs.  Current Outpatient Prescriptions on File Prior to Visit  Medication Sig Dispense Refill  . aspirin 81 MG chewable tablet Chew 1 tablet (81 mg total) by mouth daily.    . Calcium Carbonate (CALTRATE 600 PO) Take 1 tablet by mouth daily.     . cholecalciferol (VITAMIN D) 1000 UNITS tablet Take 1 tablet (1,000 Units total) by mouth daily. 90 tablet 3  . clopidogrel (PLAVIX) 75 MG tablet Take 1 tablet (75 mg total) by mouth daily with breakfast. 30 tablet 11  . IRON PO Take 1 tablet by mouth daily.    Marland Kitchen lovastatin (MEVACOR) 40 MG tablet Take 1 tablet (40 mg total) by mouth at bedtime. 30 tablet 2  . metoprolol tartrate (LOPRESSOR) 25 MG tablet TAKE 1 TABLET BY MOUTH TWICE DAILY 60 tablet 5  . mometasone-formoterol (DULERA) 100-5 MCG/ACT AERO Inhale 2 puffs into the lungs  2 (two) times daily. 8.8 g 5  . Multiple Vitamins-Minerals (MULTIVITAL) tablet Take 1 tablet by mouth daily. Centrum Silver 90 tablet 3  . niacin 500 MG CR capsule Take 500 mg by mouth at bedtime.       No current facility-administered medications on file prior to visit.     Objective:  Objective Physical Exam  Constitutional: She is oriented to person, place, and time. She appears well-developed and well-nourished.  HENT:  Head:  Normocephalic and atraumatic.  Eyes: Conjunctivae and EOM are normal.  Neck: Normal range of motion. Neck supple. No JVD present. Carotid bruit is not present. No thyromegaly present.  Cardiovascular: Normal rate, regular rhythm and normal heart sounds.   No murmur heard. Pulmonary/Chest: Effort normal and breath sounds normal. No respiratory distress. She has no wheezes. She has no rales. She exhibits no tenderness.  Musculoskeletal: She exhibits no edema.  Neurological: She is alert and oriented to person, place, and time.  Psychiatric: She has a normal mood and affect.  Sensory exam of the foot is normal, tested with the monofilament. Good pulses, no lesions or ulcers, good peripheral pulses.  BP 140/62 mmHg  Pulse 72  Temp(Src) 98.1 F (36.7 C) (Oral)  Ht 5\' 4"  (1.626 m)  Wt 153 lb 3.2 oz (69.491 kg)  BMI 26.28 kg/m2  SpO2 97% Wt Readings from Last 3 Encounters:  03/10/15 153 lb 3.2 oz (69.491 kg)  03/07/15 153 lb (69.4 kg)  12/21/14 159 lb 11.2 oz (72.439 kg)     Lab Results  Component Value Date   WBC 5.5 11/23/2014   HGB 10.6* 11/23/2014   HCT 32.1* 11/23/2014   PLT 242 11/23/2014   GLUCOSE 102* 11/23/2014   CHOL 165 09/06/2014   TRIG 133.0 09/06/2014   HDL 53.60 09/06/2014   LDLCALC 85 09/06/2014   ALT 12 09/06/2014   AST 17 09/06/2014   NA 140 11/23/2014   K 4.2 11/23/2014   CL 107 11/23/2014   CREATININE 1.10 11/23/2014   BUN 15 11/23/2014   CO2 27 11/23/2014   TSH 2.372 11/11/2014   INR 0.95 11/11/2014   HGBA1C 6.0 12/07/2013   MICROALBUR 0.1 09/06/2014    No results found.   Assessment & Plan:  Plan I have discontinued Ms. Babler's lisinopril. I am also having her start on losartan and glucose blood. Additionally, I am having her maintain her Calcium Carbonate (CALTRATE 600 PO), niacin, metoprolol tartrate, lovastatin, IRON PO, mometasone-formoterol, clopidogrel, aspirin, MULTIVITAL, and cholecalciferol.  Meds ordered this encounter  Medications    . losartan (COZAAR) 100 MG tablet    Sig: Take 1 tablet (100 mg total) by mouth daily.    Dispense:  30 tablet    Refill:  3  . glucose blood test strip    Sig: Use as instructed    Dispense:  100 each    Refill:  12    One touch verio flex    Problem List Items Addressed This Visit    Essential hypertension   Relevant Medications   losartan (COZAAR) 100 MG tablet   Other Relevant Orders   Basic metabolic panel   Dyslipidemia    con't lovastatin and check labs      DM (diabetes mellitus) type II controlled peripheral vascular disorder - Primary    Diet controlled Check labs      Relevant Medications   losartan (COZAAR) 100 MG tablet   glucose blood test strip   Other Relevant Orders   Hemoglobin  A1c   Microalbumin / creatinine urine ratio   POCT urinalysis dipstick    Other Visit Diagnoses    Hyperlipidemia LDL goal <70        Relevant Medications    losartan (COZAAR) 100 MG tablet    Other Relevant Orders    Hepatic function panel    Lipid panel       Follow-up: Return in about 6 months (around 09/09/2015), or if symptoms worsen or fail to improve, for hypertension, hyperlipidemia, diabetes II, fasting.  Garnet Koyanagi, DO

## 2015-03-10 NOTE — Progress Notes (Signed)
Pre visit review using our clinic review tool, if applicable. No additional management support is needed unless otherwise documented below in the visit note. 

## 2015-04-01 ENCOUNTER — Other Ambulatory Visit: Payer: Self-pay

## 2015-04-01 MED ORDER — ACCU-CHEK AVIVA PLUS W/DEVICE KIT: PACK | Status: DC

## 2015-04-01 MED ORDER — ACCU-CHEK SOFTCLIX LANCETS MISC: Status: AC

## 2015-04-01 MED ORDER — GLUCOSE BLOOD VI STRP: ORAL_STRIP | Status: DC

## 2015-04-01 NOTE — Telephone Encounter (Signed)
Message from the pharmacy and the patients plan only covers accu-chek products. Rx faxed.     KP

## 2015-04-05 ENCOUNTER — Telehealth: Payer: Self-pay | Admitting: Cardiovascular Disease

## 2015-04-05 NOTE — Telephone Encounter (Signed)
Closed encounter °

## 2015-05-01 ENCOUNTER — Other Ambulatory Visit: Payer: Self-pay | Admitting: Family Medicine

## 2015-06-16 ENCOUNTER — Other Ambulatory Visit: Payer: Self-pay | Admitting: Cardiovascular Disease

## 2015-06-16 DIAGNOSIS — I700 Atherosclerosis of aorta: Secondary | ICD-10-CM

## 2015-06-21 ENCOUNTER — Ambulatory Visit: Payer: Medicare Other

## 2015-06-21 ENCOUNTER — Ambulatory Visit: Payer: Medicare Other | Admitting: Pulmonary Disease

## 2015-06-23 ENCOUNTER — Ambulatory Visit (HOSPITAL_BASED_OUTPATIENT_CLINIC_OR_DEPARTMENT_OTHER)
Admission: RE | Admit: 2015-06-23 | Discharge: 2015-06-23 | Disposition: A | Discharge status: Home / Self Care | Payer: Medicare Other | Source: Ambulatory Visit | Attending: Cardiovascular Disease | Admitting: Cardiovascular Disease

## 2015-06-23 ENCOUNTER — Ambulatory Visit (HOSPITAL_COMMUNITY)
Admission: RE | Admit: 2015-06-23 | Discharge: 2015-06-23 | Disposition: A | Discharge status: Home / Self Care | Payer: Medicare Other | Source: Ambulatory Visit | Attending: Cardiovascular Disease | Admitting: Cardiovascular Disease

## 2015-06-23 DIAGNOSIS — E119 Type 2 diabetes mellitus without complications: Secondary | ICD-10-CM | POA: Diagnosis not present

## 2015-06-23 DIAGNOSIS — I2510 Atherosclerotic heart disease of native coronary artery without angina pectoris: Secondary | ICD-10-CM | POA: Insufficient documentation

## 2015-06-23 DIAGNOSIS — I700 Atherosclerosis of aorta: Secondary | ICD-10-CM | POA: Diagnosis not present

## 2015-06-23 DIAGNOSIS — E785 Hyperlipidemia, unspecified: Secondary | ICD-10-CM | POA: Diagnosis not present

## 2015-06-23 DIAGNOSIS — F17200 Nicotine dependence, unspecified, uncomplicated: Secondary | ICD-10-CM | POA: Insufficient documentation

## 2015-06-23 DIAGNOSIS — I10 Essential (primary) hypertension: Secondary | ICD-10-CM | POA: Insufficient documentation

## 2015-06-25 ENCOUNTER — Other Ambulatory Visit: Payer: Self-pay | Admitting: Family Medicine

## 2015-06-28 ENCOUNTER — Encounter: Payer: Self-pay | Admitting: Cardiovascular Disease

## 2015-06-28 ENCOUNTER — Ambulatory Visit (INDEPENDENT_AMBULATORY_CARE_PROVIDER_SITE_OTHER): Payer: Medicare Other | Admitting: Cardiovascular Disease

## 2015-06-28 VITALS — BP 164/74 | HR 75 | Ht 62.0 in | Wt 149.4 lb

## 2015-06-28 DIAGNOSIS — I739 Peripheral vascular disease, unspecified: Secondary | ICD-10-CM

## 2015-06-28 DIAGNOSIS — I6523 Occlusion and stenosis of bilateral carotid arteries: Secondary | ICD-10-CM

## 2015-06-28 DIAGNOSIS — R0989 Other specified symptoms and signs involving the circulatory and respiratory systems: Secondary | ICD-10-CM | POA: Diagnosis not present

## 2015-06-28 DIAGNOSIS — R0600 Dyspnea, unspecified: Secondary | ICD-10-CM

## 2015-06-28 DIAGNOSIS — I701 Atherosclerosis of renal artery: Secondary | ICD-10-CM

## 2015-06-28 NOTE — Patient Instructions (Signed)
Medication Instructions:  Your physician recommends that you continue on your current medications as directed. Please refer to the Current Medication list given to you today.   Labwork: none  Testing/Procedures: Your physician has requested that you have a carotid duplex. This test is an ultrasound of the carotid arteries in your neck. It looks at blood flow through these arteries that supply the brain with blood. Allow one hour for this exam. There are no restrictions or special instructions. IN 1 YEAR  Your physician has requested that you have a lower extremity arterial doppler- During this test, ultrasound is used to evaluate arterial blood flow in the legs. Allow approximately one hour for this exam. IN 1 YEAR   Follow-Up: Your physician wants you to follow-up in: 12 months with Dr. Andria Rhein will receive a reminder letter in the mail two months in advance. If you don't receive a letter, please call our office to schedule the follow-up appointment.   Any Other Special Instructions Will Be Listed Below (If Applicable).

## 2015-06-28 NOTE — Progress Notes (Signed)
06/28/2015 Christine Meyer   03-15-1951  [id number]  Primary Physician Christine Koyanagi, DO Primary Cardiologist: Christine Harp MD Christine Meyer   HPI:  Christine Meyer is a 64 year old thin appearing single African-American female mother of 4 children . She was referred by Christine Meyer , her cardiologist,  for peripheral vascular evaluation. I last saw her in the office 10/22/14.She has a history of myocardial infarction back in 1999 undergoing stenting of her RCA by Christine Meyer.her chronic risk factors are notable for continued tobacco abuse, treated hypertension and hyperlipidemia. She is complained of increasing dyspnea on exertion over the last 6 months as well as right calf claudication. Christine Meyer saw her and ordered a stress test. 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 She has had right renal artery stenting in the past as well as right external iliac artery stenting as well. 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 I stented both of these areas. She did have an occluded left SFA with high-grade segmental diffuse right SFA stenosis. Her Dopplers and symptoms improved.   Current Outpatient Prescriptions  Medication Sig Dispense Refill  . ACCU-CHEK SOFTCLIX LANCETS lancets Check blood sugar once daily. Dx:E11.9 100 each 5  . aspirin 81 MG chewable tablet Chew 1 tablet (81 mg total) by mouth daily.    . Blood Glucose Monitoring Suppl (ACCU-CHEK AVIVA PLUS) W/DEVICE KIT Check Blood sugar daily. Dx E11.9 1 kit 0  . Calcium Carbonate (CALTRATE 600 PO) Take 1 tablet by mouth daily.     . cholecalciferol (VITAMIN D) 1000 UNITS tablet Take 1 tablet (1,000 Units total) by mouth daily. 90 tablet 3  . clopidogrel (PLAVIX) 75 MG tablet Take 1 tablet (75 mg total) by mouth daily  with breakfast. 30 tablet 11  . glucose blood (ACCU-CHEK AVIVA PLUS) test strip Check blood sugar once daily. Dx:E11.9 100 each 5  . IRON PO Take 1 tablet by mouth daily.    Marland Kitchen lisinopril (PRINIVIL,ZESTRIL) 40 MG tablet Take 40 mg by mouth daily.  3  . losartan (COZAAR) 100 MG tablet Take 1 tablet (100 mg total) by mouth daily. 30 tablet 3  . lovastatin (MEVACOR) 40 MG tablet Take 1 tablet (40 mg total) by mouth at bedtime. 30 tablet 2  . metoprolol tartrate (LOPRESSOR) 25 MG tablet TAKE 1 TABLET BY MOUTH TWICE DAILY 180 tablet 1  . mometasone-formoterol (DULERA) 100-5 MCG/ACT AERO Inhale 2 puffs into the lungs 2 (two) times daily. 8.8 g 5  . Multiple Vitamins-Minerals (MULTIVITAL) tablet Take 1 tablet by mouth daily. Centrum Silver 90 tablet 3  . niacin 500 MG CR capsule Take 500 mg by mouth at bedtime.       No current facility-administered medications for this visit.    No Known Allergies  Social History   Social History  . Marital Status: Single    Spouse Name: N/A  . Number of Children: 4  . Years of Education: N/A   Occupational History  . Not on file.   Social History Main Topics  . Smoking status: Current Every Day Smoker -- 0.25 packs/day for 55 years    Types: Cigarettes  . Smokeless tobacco: Never Used  . Alcohol Use: No  . Drug Use: No  . Sexual Activity: Yes   Other Topics Concern  . Not  on file   Social History Narrative   Lives at home with grandson.      Review of Systems: General: negative for chills, fever, night sweats or weight changes.  Cardiovascular: negative for chest pain, dyspnea on exertion, edema, orthopnea, palpitations, paroxysmal nocturnal dyspnea or shortness of breath Dermatological: negative for rash Respiratory: negative for cough or wheezing Urologic: negative for hematuria Abdominal: negative for nausea, vomiting, diarrhea, bright red blood per rectum, melena, or hematemesis Neurologic: negative for visual changes, syncope, or  dizziness All other systems reviewed and are otherwise negative except as noted above.    Blood pressure 164/74, pulse 75, height 5' 2"  (1.575 m), weight 149 lb 6.4 oz (67.767 kg).  General appearance: alert and no distress Neck: no adenopathy, no JVD, supple, symmetrical, trachea midline, thyroid not enlarged, symmetric, no tenderness/mass/nodules and soft bilateral carotid bruits Lungs: clear to auscultation bilaterally Heart: regular rate and rhythm, S1, S2 normal, no murmur, click, rub or gallop Extremities: extremities normal, atraumatic, no cyanosis or edema and 2+ right femoral pulse without bruit  EKG normal sinus rhythm 75 without ST or T-wave changes. I personally reviewed this EKG  ASSESSMENT AND PLAN:   Renal artery stenosis, native, bilateral History of remote right renal artery stenting and restenting by myself with angiogram performed 11/22/14 revealing right renal artery stent to be totally occluded. The right renal artery is widely patent.  Peripheral arterial disease History of peripheral arterial disease status post remote right external iliac artery stenting with angiographic with documented in-stent restenosis as well as high-grade right common iliac artery ostial disease. I stented both of these on 11/12/14. She is a known occluded left SFA with moderately severe segmental right SFA stenosis and three-vessel runoff bilaterally. Her claudication resolved her Dopplers improved. She is on dual antiplatelet  therapy.      Christine Harp MD FACP,FACC,FAHA, [facility] 06/28/2015 11:48 AM

## 2015-06-28 NOTE — Assessment & Plan Note (Signed)
History of remote right renal artery stenting and restenting by myself with angiogram performed 11/22/14 revealing right renal artery stent to be totally occluded. The right renal artery is widely patent.

## 2015-06-28 NOTE — Assessment & Plan Note (Signed)
History of peripheral arterial disease status post remote right external iliac artery stenting with angiographic with documented in-stent restenosis as well as high-grade right common iliac artery ostial disease. I stented both of these on 11/12/14. She is a known occluded left SFA with moderately severe segmental right SFA stenosis and three-vessel runoff bilaterally. Her claudication resolved her Dopplers improved. She is on dual antiplatelet  therapy.

## 2015-07-03 ENCOUNTER — Other Ambulatory Visit: Payer: Self-pay | Admitting: Family Medicine

## 2015-07-07 ENCOUNTER — Other Ambulatory Visit (INDEPENDENT_AMBULATORY_CARE_PROVIDER_SITE_OTHER): Payer: Medicare Other

## 2015-07-07 ENCOUNTER — Encounter: Payer: Self-pay | Admitting: Pulmonary Disease

## 2015-07-07 ENCOUNTER — Ambulatory Visit (INDEPENDENT_AMBULATORY_CARE_PROVIDER_SITE_OTHER): Payer: Medicare Other | Admitting: Pulmonary Disease

## 2015-07-07 VITALS — BP 162/62 | HR 73 | Ht 62.0 in | Wt 149.0 lb

## 2015-07-07 DIAGNOSIS — J8410 Pulmonary fibrosis, unspecified: Secondary | ICD-10-CM

## 2015-07-07 DIAGNOSIS — J849 Interstitial pulmonary disease, unspecified: Secondary | ICD-10-CM

## 2015-07-07 DIAGNOSIS — I701 Atherosclerosis of renal artery: Secondary | ICD-10-CM | POA: Diagnosis not present

## 2015-07-07 DIAGNOSIS — Z5181 Encounter for therapeutic drug level monitoring: Secondary | ICD-10-CM

## 2015-07-07 DIAGNOSIS — J432 Centrilobular emphysema: Secondary | ICD-10-CM

## 2015-07-07 LAB — COMPREHENSIVE METABOLIC PANEL
ALBUMIN: 4 g/dL (ref 3.5–5.2)
ALK PHOS: 74 U/L (ref 39–117)
ALT: 19 U/L (ref 0–35)
AST: 20 U/L (ref 0–37)
BUN: 11 mg/dL (ref 6–23)
CALCIUM: 9.6 mg/dL (ref 8.4–10.5)
CO2: 25 mEq/L (ref 19–32)
CREATININE: 1.03 mg/dL (ref 0.40–1.20)
Chloride: 106 mEq/L (ref 96–112)
GFR: 68.89 mL/min (ref >60.00)
Glucose, Bld: 91 mg/dL (ref 70–99)
POTASSIUM: 3.8 meq/L (ref 3.5–5.1)
Sodium: 141 mEq/L (ref 135–145)
TOTAL PROTEIN: 7.4 g/dL (ref 6.0–8.3)
Total Bilirubin: 0.3 mg/dL (ref 0.2–1.2)

## 2015-07-07 LAB — PULMONARY FUNCTION TEST
DL/VA % PRED: 60 %
DL/VA: 2.76 ml/min/mmHg/L
DLCO unc % pred: 44 %
DLCO unc: 9.51 ml/min/mmHg
FEF 25-75 POST: 2.05 L/s
FEF 25-75 Pre: 1.75 L/sec
FEF2575-%CHANGE-POST: 16 %
FEF2575-%PRED-POST: 121 %
FEF2575-%PRED-PRE: 103 %
FEV1-%CHANGE-POST: 1 %
FEV1-%Pred-Post: 113 %
FEV1-%Pred-Pre: 111 %
FEV1-PRE: 1.97 L
FEV1-Post: 2 L
FEV1FVC-%CHANGE-POST: 3 %
FEV1FVC-%PRED-PRE: 102 %
FEV6-%Change-Post: -1 %
FEV6-%Pred-Post: 111 %
FEV6-%Pred-Pre: 112 %
FEV6-Post: 2.41 L
FEV6-Pre: 2.45 L
FEV6FVC-%Change-Post: 0 %
FEV6FVC-%Pred-Post: 104 %
FEV6FVC-%Pred-Pre: 104 %
FVC-%Change-Post: -1 %
FVC-%PRED-POST: 106 %
FVC-%PRED-PRE: 108 %
FVC-POST: 2.41 L
FVC-PRE: 2.45 L
POST FEV1/FVC RATIO: 83 %
PRE FEV1/FVC RATIO: 80 %
PRE FEV6/FVC RATIO: 100 %
Post FEV6/FVC ratio: 100 %
RV % pred: 71 %
RV: 1.43 L
TLC % PRED: 80 %
TLC: 3.82 L

## 2015-07-07 NOTE — Assessment & Plan Note (Signed)
This has been a stable interval for her and she remains asymptomatic. Today's lung function testing does not show progression. However, we reviewed the pathophysiology of this disease again today and she understands that this will progress. She was advised today that the Esbriet will only slow the progression of the disease and will not cause any improvement. She also was advised on the side effect profile.  Plan: After lengthy conversation in clinic today with Christine Meyer in her daughter, she has decided to take Esbriet.   Check liver function testing today Her daughter would prefer to use a lower dose considering her kidney function abnormalities. We will plan on using 2 pills 3 times a day 6 minute walk today 6 minute walk in 6 months Pulmonary function testing in 6 months Follow-up in 6-8 weeks

## 2015-07-07 NOTE — Patient Instructions (Signed)
We will start Esbriet We will call you with the bloodwork from today We will see you back in 6-8 weeks or sooner if needed

## 2015-07-07 NOTE — Assessment & Plan Note (Signed)
Stable interval.  Plan: Continue Ruthe Mannan

## 2015-07-07 NOTE — Progress Notes (Signed)
PFT done today. 

## 2015-07-07 NOTE — Progress Notes (Signed)
Subjective:    Patient ID: Christine Meyer, female    DOB: Aug 02, 1951, 65 y.o.   MRN: [id number]  Synopsis: Christine Meyer was referred to the [facility] in 2016 for evaluation of shortness of breath. She had a CT chest performed which showed evidence of usual interstitial pneumonitis as well as centrilobular emphysema. She has a significant smoking history. 12 2015 CT chest> no pulmonary embolism, moderate centrilobular emphysema, there is interstitial septal thickening as well as honeycombing in a patchy and peripheral distribution with a clear craniocaudal gradient. This is consistent with usual interstitial pneumonitis.  10/2014 ANA, Aldolase, CCP, SCL 70, SSA/SSB, Jo-1 all negative; however ESR 40, RF 17 (both elevated) 11/2014 PFT> Ratio 73%, FEV1 1.71L (98% pred), TLC 4.16L (87% pred), DLCO 8.31 (38% pred) April 2016 rheumatology visit (Dr. Trudie Reed no evidence of connective tissue disease October 2016 pulmonary function testing ratio 83%, FEV1 2.00 L (113% predicted), FVC 2.41 L (106% predicted), total lung capacity 3.82 L (80% predicted) C, DLCO 9.51 (44% predicted).   HPI Chief Complaint  Patient presents with  . Follow-up    With PFT. Pt c/o occasional cough with white mucus and dyspnea with exertion. Pt denies wheeze/CP/tightness.    Christine Meyer has been doing well since the last visit. Specifically, she says that she remains quite active working in her [employer]. While there she carries heavy objects, does a lot of walking, and works in a hot environment. She never has problems breathing while working there. She continues to take her do where a. She has not had a go to the doctor for an exacerbation of her emphysema. Specifically, she says that she has not had any bronchitis or pneumonia. She is here today to discuss the results of her lung function testing. Past Medical History  Diagnosis Date  . Anemia     NOS  . Hypertension   . Hyperlipidemia   . CAD (coronary  artery disease)     Stent RCA 1999  . PVD (peripheral vascular disease) (HCC)     Carotid stenosis, renal artery stenosis  . COPD (chronic obstructive pulmonary disease) ([place])   . Sleep apnea     dx'd but doesn't use mask   . Diabetes mellitus, type 2 (HCC)     Diet controlled  . Uterine cancer ([place]) ?1980's  . OSA on CPAP       Review of Systems  Constitutional: Negative for fever, chills and fatigue.  HENT: Negative for postnasal drip, rhinorrhea and sinus pressure.   Respiratory: Positive for shortness of breath. Negative for cough and wheezing.   Cardiovascular: Negative for chest pain, palpitations and leg swelling.       Objective:   Physical Exam Filed Vitals:   07/07/15 1154  BP: 162/62  Pulse: 73  Height: _0  (1.575 m)  Weight: 149 lb (67.586 kg)  SpO2: 98%   RA  Gen: well appearing, no acute distress HEENT: NCAT,  EOMi, OP clear, PULM: Crackles in bases, otherwise clear CV: RRR, no mgr, no JVD AB: BS+, soft, nontender Ext: warm, no edema, no clubbing, no cyanosis Derm: no rash or skin breakdown Neuro: A&Ox4, MAEW  Today's lung function testing was reviewed today      Assessment & Plan:   Usual interstitial pneumonitis This has been a stable interval for her and she remains asymptomatic. Today's lung function testing does not show progression. However, we reviewed the pathophysiology of this disease again today and she understands that this will progress.  She was advised today that the Esbriet will only slow the progression of the disease and will not cause any improvement. She also was advised on the side effect profile.  Plan: After lengthy conversation in clinic today with Christine Meyer in her daughter, she has decided to take Esbriet.   Check liver function testing today Her daughter would prefer to use a lower dose considering her kidney function abnormalities. We will plan on using 2 pills 3 times a day 6 minute walk today 6 minute walk in 6  months Pulmonary function testing in 6 months Follow-up in 6-8 weeks  Centrilobular emphysema (HCC) Stable interval.  Plan: Continue Dulera  > 15 minutes spent in face to face time as part of a 30 minute visit  Updated Medication List Outpatient Encounter Prescriptions as of 07/07/2015  Medication Sig  . ACCU-CHEK SOFTCLIX LANCETS lancets Check blood sugar once daily. Dx:E11.9  . aspirin 81 MG chewable tablet Chew 1 tablet (81 mg total) by mouth daily.  . Blood Glucose Monitoring Suppl (ACCU-CHEK AVIVA PLUS) W/DEVICE KIT Check Blood sugar daily. Dx E11.9  . Calcium Carbonate (CALTRATE 600 PO) Take 1 tablet by mouth daily.   . cholecalciferol (VITAMIN D) 1000 UNITS tablet Take 1 tablet (1,000 Units total) by mouth daily.  . clopidogrel (PLAVIX) 75 MG tablet Take 1 tablet (75 mg total) by mouth daily with breakfast.  . glucose blood (ACCU-CHEK AVIVA PLUS) test strip Check blood sugar once daily. Dx:E11.9  . IRON PO Take 1 tablet by mouth daily.  Marland Kitchen losartan (COZAAR) 100 MG tablet TAKE 1 TABLET(100 MG) BY MOUTH DAILY  . lovastatin (MEVACOR) 40 MG tablet Take 1 tablet (40 mg total) by mouth at bedtime.  . metoprolol tartrate (LOPRESSOR) 25 MG tablet TAKE 1 TABLET BY MOUTH TWICE DAILY  . mometasone-formoterol (DULERA) 100-5 MCG/ACT AERO Inhale 2 puffs into the lungs 2 (two) times daily.  . Multiple Vitamins-Minerals (MULTIVITAL) tablet Take 1 tablet by mouth daily. Centrum Silver  . niacin 500 MG CR capsule Take 500 mg by mouth at bedtime.    . [DISCONTINUED] lisinopril (PRINIVIL,ZESTRIL) 40 MG tablet Take 40 mg by mouth daily.   No facility-administered encounter medications on file as of 07/07/2015.

## 2015-07-08 NOTE — Progress Notes (Signed)
Quick Note:  Attempted to contact, no answer [email or web address] ______

## 2015-07-12 ENCOUNTER — Telehealth: Payer: Self-pay | Admitting: Pulmonary Disease

## 2015-07-13 NOTE — Telephone Encounter (Signed)
Forms received.  

## 2015-07-14 NOTE — Telephone Encounter (Signed)
esbriet forms have been faxed to [facility].  Will hold in my inbasket to follow up on.

## 2015-07-15 ENCOUNTER — Other Ambulatory Visit: Payer: Self-pay | Admitting: Pulmonary Disease

## 2015-07-25 NOTE — Telephone Encounter (Signed)
Christine Meyer, have you received anything on this pt yet?

## 2015-08-01 ENCOUNTER — Telehealth: Payer: Self-pay | Admitting: Pulmonary Disease

## 2015-08-01 NOTE — Telephone Encounter (Signed)
Attempted to call Janett Billow at [facility] at number and extension give Rang multiple times with no option to leave a voicemail  Will try to call back later

## 2015-08-02 ENCOUNTER — Telehealth: Payer: Self-pay | Admitting: Pulmonary Disease

## 2015-08-02 NOTE — Telephone Encounter (Signed)
Spoke with pt. She is aware of how she will be taking this medication. Nothing further was needed.

## 2015-08-02 NOTE — Telephone Encounter (Signed)
Needing Rx clarification of the Esbriet. Per last OV the pt requested to stay at dose 2 cap TID d/t Kidney Function. Called and spoke with Annie Main (pharmacist)- clarified Rx dosing.   Plan: After lengthy conversation in clinic today with Mrs. Janeway in her daughter, she has decided to take Esbriet.  Check liver function testing today Her daughter would prefer to use a lower dose considering her kidney function abnormalities. We will plan on using 2 pills 3 times a day 6 minute walk today 6 minute walk in 6 months Pulmonary function testing in 6 months Follow-up in 6-8 weeks  Nothing further needed.

## 2015-08-02 NOTE — Telephone Encounter (Signed)
Do you know how BQ is prescribing Esbriet for her? I know it's different for every patient. Thanks.

## 2015-08-02 NOTE — Telephone Encounter (Signed)
Per BQ, pt will titrate up to 2 pills TID.  This does differ from the usual 3 pills TID maintenance dosage.  Per last ov note: After lengthy conversation in clinic today with Mrs. Schouten in her daughter, she has decided to take Esbriet.  Check liver function testing today Her daughter would prefer to use a lower dose considering her kidney function abnormalities. We will plan on using 2 pills 3 times a day  Thanks!

## 2015-08-02 NOTE — Telephone Encounter (Signed)
Medication she received is Esbriet.

## 2015-08-15 ENCOUNTER — Telehealth: Payer: Self-pay | Admitting: Pulmonary Disease

## 2015-08-15 NOTE — Telephone Encounter (Signed)
Will hold in triage until forms are received

## 2015-08-16 NOTE — Telephone Encounter (Signed)
Form has been received, awaiting BQ's initial on this

## 2015-08-18 NOTE — Telephone Encounter (Signed)
Form will be initialed by BQ when he returns to the office on 08/22/15.

## 2015-08-19 NOTE — Telephone Encounter (Signed)
esbriet access [employer] [phone] opt 2 Case # [id number] smn had [birth year] on it needs correct dob marked thru and initialed when they talked to [facility] there was indication they may which to another therapy

## 2015-08-19 NOTE — Telephone Encounter (Signed)
Spoke with Charleston Ropes at [employer]- just needing corrected SMN faxed back Checked with Caryl Pina and she is waiting on Dr Lake Bells to return to the office to sign off on this corrected form and this will then be faxed back.  Will send to [facility] to follow up.

## 2015-08-22 NOTE — Telephone Encounter (Signed)
Form has been faxed back to [employer] [employer],s confirmation on fax received.  Nothing further needed.

## 2015-08-25 ENCOUNTER — Telehealth: Payer: Self-pay | Admitting: Pulmonary Disease

## 2015-08-25 NOTE — Telephone Encounter (Signed)
Spoke with [employer]. They state that the pt's Esbriet case has been closed. Per their records, they were told by the pt's pharmacy that BQ has decided to prescribe another medication instead of Esbriet.  BQ - is this correct?

## 2015-08-29 NOTE — Telephone Encounter (Signed)
No, I don't recall making that decision.  I was under the impression we were going to treat her with Esbriet

## 2015-08-29 NOTE — Telephone Encounter (Addendum)
Spoke with Eula Listen from [place], she said that patient advised [place] that she is no longer taking Esbriet.  [facility] pharmacy contacted Manus Rudd and advised them that patient is not taking Esbriet.  They closed the account.  However, since they received verification from Dr. Lake Bells that she is to stay on Esbriet, they have re-opened the case.  She requested that we contact [employer] insurance to confirm approval of the medications and call her back with the approval dates.  [place] PA dept at [phone], spoke with Karna Christmas  (Pt's Mcarthur Rossetti number is: [id number]) She said that Jacklynn Bue was denied in November 2016 because no prior authorization was completed.  Requested that PA be initiated.  Terri stated that she will fax over paperwork to be completed by Dr. Lake Bells for PA on Esbriet.    [employer] PA Dept: [id number] PA Authorization number: [id number]  Called and advised Shalina at [place] that [place] has been initiated for [place].  Will hold in triage until form has been received from [facility].

## 2015-08-31 NOTE — Telephone Encounter (Signed)
Received form from [facility]. Form filled out a faxed back to [facility]. Will await response.

## 2015-09-06 ENCOUNTER — Telehealth: Payer: Self-pay | Admitting: Pulmonary Disease

## 2015-09-06 NOTE — Telephone Encounter (Signed)
Is there something I need to do with this?

## 2015-09-06 NOTE — Telephone Encounter (Signed)
BQ- an appeal letter needs to be written to pt's insurance company explaining why Esbriet is needed for patient.  Please advise on what needs to be included in this letter.  Thanks!

## 2015-09-06 NOTE — Telephone Encounter (Signed)
I have paper saying it was denied. I will fax to you.

## 2015-09-06 NOTE — Telephone Encounter (Signed)
OK  To Whom it may concern:  Ms. Christine Meyer has been diagnosed with idiopathic pulmonary fibrosis based on her complaint of dyspnea, her exam findings of crackles on lung auscultation, and a high resolution CT chest which showed evidence of usual interstitial pneumonia.  She does not have an underlying connective tissue disease.  Esbriet has been shown to slow the progression of IPF, therefore I am prescribing it to her.

## 2015-09-06 NOTE — Telephone Encounter (Signed)
Misty please advise if you have received anything back from [facility]. Thanks.

## 2015-09-06 NOTE — Telephone Encounter (Signed)
Denial received and placed in BQ look at. Will forward to [facility]

## 2015-09-06 NOTE — Telephone Encounter (Signed)
Called [phone]) and spoke with Junie Panning at [facility]. She stated that he letter needs to be faxed to [phone].  Letter was composed and faxed to number listed above. Letter printed and faxed. Nothing further needed.

## 2015-09-06 NOTE — Telephone Encounter (Signed)
Per 10/27 visit: Her daughter would prefer to use a lower dose considering her kidney function abnormalities. We will plan on using 2 pills 3 times a day ---  I called spoke with [place]. Made aware pt is going to be taking 2 tabs TID. They have noted this in the system. Nothing further needed

## 2015-09-06 NOTE — Telephone Encounter (Signed)
Will send to triage to hold until PA denial information is received - being faxed from [facility] today.

## 2015-09-08 ENCOUNTER — Encounter: Payer: Self-pay | Admitting: Family Medicine

## 2015-09-08 ENCOUNTER — Other Ambulatory Visit: Payer: Self-pay | Admitting: Family Medicine

## 2015-09-08 ENCOUNTER — Ambulatory Visit (INDEPENDENT_AMBULATORY_CARE_PROVIDER_SITE_OTHER): Payer: Medicare Other | Admitting: Family Medicine

## 2015-09-08 VITALS — BP 132/74 | HR 67 | Temp 98.4°F | Ht 62.0 in | Wt 150.6 lb

## 2015-09-08 DIAGNOSIS — I701 Atherosclerosis of renal artery: Secondary | ICD-10-CM

## 2015-09-08 DIAGNOSIS — E785 Hyperlipidemia, unspecified: Secondary | ICD-10-CM | POA: Diagnosis not present

## 2015-09-08 DIAGNOSIS — Z1231 Encounter for screening mammogram for malignant neoplasm of breast: Secondary | ICD-10-CM

## 2015-09-08 DIAGNOSIS — Z23 Encounter for immunization: Secondary | ICD-10-CM

## 2015-09-08 DIAGNOSIS — Z1159 Encounter for screening for other viral diseases: Secondary | ICD-10-CM | POA: Diagnosis not present

## 2015-09-08 DIAGNOSIS — E119 Type 2 diabetes mellitus without complications: Secondary | ICD-10-CM

## 2015-09-08 LAB — POCT URINALYSIS DIPSTICK
BILIRUBIN UA: NEGATIVE
Blood, UA: NEGATIVE
Glucose, UA: NEGATIVE
KETONES UA: NEGATIVE
Leukocytes, UA: NEGATIVE
Nitrite, UA: NEGATIVE
PROTEIN UA: NEGATIVE
Urobilinogen, UA: 0.2
pH, UA: 6

## 2015-09-08 LAB — LIPID PANEL
CHOLESTEROL: 189 mg/dL (ref 0–200)
HDL: 49.9 mg/dL (ref >39.00)
LDL CALC: 112 mg/dL — AB (ref 0–99)
NONHDL: 138.99
Total CHOL/HDL Ratio: 4
Triglycerides: 137 mg/dL (ref 0.0–149.0)
VLDL: 27.4 mg/dL (ref 0.0–40.0)

## 2015-09-08 LAB — COMPREHENSIVE METABOLIC PANEL
ALBUMIN: 3.8 g/dL (ref 3.5–5.2)
ALK PHOS: 75 U/L (ref 39–117)
ALT: 17 U/L (ref 0–35)
AST: 20 U/L (ref 0–37)
BILIRUBIN TOTAL: 0.3 mg/dL (ref 0.2–1.2)
BUN: 12 mg/dL (ref 6–23)
CALCIUM: 9.4 mg/dL (ref 8.4–10.5)
CO2: 26 meq/L (ref 19–32)
CREATININE: 0.97 mg/dL (ref 0.40–1.20)
Chloride: 108 mEq/L (ref 96–112)
GFR: 73.79 mL/min (ref >60.00)
Glucose, Bld: 90 mg/dL (ref 70–99)
Potassium: 3.7 mEq/L (ref 3.5–5.1)
Sodium: 141 mEq/L (ref 135–145)
Total Protein: 7 g/dL (ref 6.0–8.3)

## 2015-09-08 LAB — HEMOGLOBIN A1C: Hgb A1c MFr Bld: 6.1 % (ref 4.6–6.5)

## 2015-09-08 MED ORDER — LOVASTATIN 40 MG PO TABS: 40.0000 mg | ORAL_TABLET | Freq: Every day | ORAL | Status: DC

## 2015-09-08 NOTE — Patient Instructions (Signed)

## 2015-09-08 NOTE — Progress Notes (Signed)
Patient ID: Christine Meyer, female    DOB: 06/06/1951  Age: 64 y.o. MRN: [id number]    Subjective:  Subjective HPI Christine Meyer presents for f/u dm , cholesterol and htn  HPI HYPERTENSION  Blood pressure range-machine broken   Chest pain- no      Dyspnea- no Lightheadedness- no   Edema- no Other side effects - no   Medication compliance: good Low salt diet- yes  DIABETES  Blood Sugar ranges-115 range  Polyuria- no New Visual problems- no Hypoglycemic symptoms- no Other side effects-no Medication compliance - good Last eye exam- due Foot exam- today  HYPERLIPIDEMIA  Medication compliance- no RUQ pain- no  Muscle aches- no Other side effects-no   Review of Systems  Constitutional: Negative for diaphoresis, appetite change, fatigue and unexpected weight change.  Eyes: Negative for pain, redness and visual disturbance.  Respiratory: Negative for cough, chest tightness, shortness of breath and wheezing.   Cardiovascular: Negative for chest pain, palpitations and leg swelling.  Endocrine: Negative for cold intolerance, heat intolerance, polydipsia, polyphagia and polyuria.  Genitourinary: Negative for dysuria, frequency and difficulty urinating.  Neurological: Negative for dizziness, light-headedness, numbness and headaches.        History Past Medical History  Diagnosis Date  . Anemia     NOS  . Hypertension   . Hyperlipidemia   . CAD (coronary artery disease)     Stent RCA 1999  . PVD (peripheral vascular disease) (HCC)     Carotid stenosis, renal artery stenosis  . COPD (chronic obstructive pulmonary disease) ([place])   . Sleep apnea     dx'd but doesn't use mask   . Diabetes mellitus, type 2 (HCC)     Diet controlled  . Uterine cancer ([place]) ?1980's  . OSA on CPAP     She has past surgical history that includes Renal artery stent (Right, May 2007; 08/2006); Coronary angioplasty with stent (1999); Colonoscopy (2000); Iliac artery stent (Right,  11/22/2014); Total abdominal hysterectomy (?1980's); lower extremity angiogram (N/A, 11/22/2014); and abdominal angiogram (11/22/2014).   Her family history includes Deep vein thrombosis in her mother; Diabetes in her brother, maternal aunt, maternal grandmother, mother, and sister; Emphysema in her brother; Heart attack in her paternal aunt; Heart failure in her mother; Hypertension in her brother, father, and mother.She reports that she has been smoking Cigarettes.  She has a 13.75 pack-year smoking history. She has never used smokeless tobacco. She reports that she does not drink alcohol or use illicit drugs.  Current Outpatient Prescriptions on File Prior to Visit  Medication Sig Dispense Refill  . ACCU-CHEK SOFTCLIX LANCETS lancets Check blood sugar once daily. Dx:E11.9 100 each 5  . aspirin 81 MG chewable tablet Chew 1 tablet (81 mg total) by mouth daily.    . Blood Glucose Monitoring Suppl (ACCU-CHEK AVIVA PLUS) W/DEVICE KIT Check Blood sugar daily. Dx E11.9 1 kit 0  . Calcium Carbonate (CALTRATE 600 PO) Take 1 tablet by mouth daily.     . cholecalciferol (VITAMIN D) 1000 UNITS tablet Take 1 tablet (1,000 Units total) by mouth daily. 90 tablet 3  . clopidogrel (PLAVIX) 75 MG tablet Take 1 tablet (75 mg total) by mouth daily with breakfast. 30 tablet 11  . DULERA 100-5 MCG/ACT AERO USE 2 PUFFS TWICE DAILY 13 g 5  . glucose blood (ACCU-CHEK AVIVA PLUS) test strip Check blood sugar once daily. Dx:E11.9 100 each 5  . IRON PO Take 1 tablet by mouth daily.    Marland Kitchen losartan (COZAAR) 100  MG tablet TAKE 1 TABLET(100 MG) BY MOUTH DAILY 90 tablet 1  . metoprolol tartrate (LOPRESSOR) 25 MG tablet TAKE 1 TABLET BY MOUTH TWICE DAILY 180 tablet 1  . Multiple Vitamins-Minerals (MULTIVITAL) tablet Take 1 tablet by mouth daily. Centrum Silver 90 tablet 3  . niacin 500 MG CR capsule Take 500 mg by mouth at bedtime.       No current facility-administered medications on file prior to visit.     Objective:    Objective Physical Exam  Constitutional: She is oriented to person, place, and time. She appears well-developed and well-nourished.  HENT:  Head: Normocephalic and atraumatic.  Eyes: Conjunctivae and EOM are normal.  Neck: Normal range of motion. Neck supple. No JVD present. Carotid bruit is not present. No thyromegaly present.  Cardiovascular: Normal rate, regular rhythm and normal heart sounds.   No murmur heard. Pulmonary/Chest: Effort normal and breath sounds normal. No respiratory distress. She has no wheezes. She has no rales. She exhibits no tenderness.  Musculoskeletal: She exhibits no edema.  Neurological: She is alert and oriented to person, place, and time.  Psychiatric: She has a normal mood and affect.   BP 132/74 mmHg  Pulse 67  Temp(Src) 98.4 F (36.9 C) (Oral)  Ht 5' 2"  (1.575 m)  Wt 150 lb 9.6 oz (68.312 kg)  BMI 27.54 kg/m2  SpO2 97% Wt Readings from Last 3 Encounters:  09/08/15 150 lb 9.6 oz (68.312 kg)  07/07/15 149 lb (67.586 kg)  06/28/15 149 lb 6.4 oz (67.767 kg)     Lab Results  Component Value Date   WBC 5.5 11/23/2014   HGB 10.6* 11/23/2014   HCT 32.1* 11/23/2014   PLT 242 11/23/2014   GLUCOSE 90 09/08/2015   CHOL 189 09/08/2015   TRIG 137.0 09/08/2015   HDL 49.90 09/08/2015   LDLCALC 112* 09/08/2015   ALT 17 09/08/2015   AST 20 09/08/2015   NA 141 09/08/2015   K 3.7 09/08/2015   CL 108 09/08/2015   CREATININE 0.97 09/08/2015   BUN 12 09/08/2015   CO2 26 09/08/2015   TSH 2.372 11/11/2014   INR 0.95 11/11/2014   HGBA1C 6.1 09/08/2015   MICROALBUR <0.7 03/10/2015    No results found.   Assessment & Plan:  Plan I am having Christine Meyer maintain her Calcium Carbonate (CALTRATE 600 PO), niacin, IRON PO, clopidogrel, aspirin, MULTIVITAL, cholecalciferol, ACCU-CHEK AVIVA PLUS, glucose blood, ACCU-CHEK SOFTCLIX LANCETS, metoprolol tartrate, losartan, DULERA, Pirfenidone, and lovastatin.  Meds ordered this encounter  Medications  .  Pirfenidone (ESBRIET) 267 MG CAPS    Sig: Take by mouth. 1 cap tid for 7 days and then increase to 2 caps tid  . lovastatin (MEVACOR) 40 MG tablet    Sig: Take 1 tablet (40 mg total) by mouth at bedtime.    Dispense:  30 tablet    Refill:  2    Problem List Items Addressed This Visit    Dyslipidemia   Relevant Medications   lovastatin (MEVACOR) 40 MG tablet   Other Relevant Orders   Lipid panel (Completed)   POCT urinalysis dipstick (Completed)   Comp Met (CMET) (Completed)    Other Visit Diagnoses    Controlled type 2 diabetes mellitus without complication, without long-term current use of insulin (HCC)    -  Primary    Relevant Medications    lovastatin (MEVACOR) 40 MG tablet    Other Relevant Orders    POCT urinalysis dipstick (Completed)    Hemoglobin A1c (  Completed)    Ambulatory referral to Ophthalmology    Need for pneumococcal vaccination        Relevant Orders    Pneumococcal polysaccharide vaccine 23-valent greater than or equal to 2yo subcutaneous/IM (Completed)    Need for hepatitis C screening test        Relevant Orders    Hepatitis C antibody       Follow-up: Return in about 6 months (around 03/08/2016), or if symptoms worsen or fail to improve, for annual exam, fasting, hypertension, hyperlipidemia, diabetes II.  Garnet Koyanagi, DO

## 2015-09-08 NOTE — Progress Notes (Signed)
Pre visit review using our clinic review tool, if applicable. No additional management support is needed unless otherwise documented below in the visit note. 

## 2015-09-09 LAB — HEPATITIS C ANTIBODY: HCV Ab: NEGATIVE

## 2015-09-21 ENCOUNTER — Telehealth: Payer: Self-pay | Admitting: Pulmonary Disease

## 2015-09-21 NOTE — Telephone Encounter (Signed)
Yes because she has CAD so we can't prescribe anything else

## 2015-09-21 NOTE — Telephone Encounter (Signed)
Esbriet has been denied for the pt. We can appeal this if we want to.  BQ - do you want to appeal this decision?  Thanks.

## 2015-09-22 NOTE — Telephone Encounter (Signed)
[place] [phone], opt 2, opt 6 Spoke with Shanon Brow, appeal can be done via telephone or by fax.  States that this was faxed already.  ----- Per Caryl Pina, denial letter received this morning and appeal form is attached.  Form placed in BQ look at. Needs to be faxed back asap.

## 2015-09-23 NOTE — Telephone Encounter (Signed)
OK,  I thought we already wrote a letter. So if not say the following.   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 She has coronary artery disease so we cannot prescribe Ofev.

## 2015-09-26 ENCOUNTER — Ambulatory Visit (HOSPITAL_BASED_OUTPATIENT_CLINIC_OR_DEPARTMENT_OTHER)
Admission: RE | Admit: 2015-09-26 | Discharge: 2015-09-26 | Disposition: A | Discharge status: Home / Self Care | Payer: Medicare Other | Source: Ambulatory Visit | Attending: Family Medicine | Admitting: Family Medicine

## 2015-09-26 DIAGNOSIS — Z1231 Encounter for screening mammogram for malignant neoplasm of breast: Secondary | ICD-10-CM | POA: Diagnosis not present

## 2015-09-26 NOTE — Telephone Encounter (Signed)
Will send to [place], BQ returned forms back to her this morning regarding Esbriet appeal.

## 2015-09-26 NOTE — Telephone Encounter (Signed)
Called appeals line at [phone] option 3 to initiate appeal for denial of esbriet.  Supporting info faxed to [phone], reference [phone]. Will continue to hold in my inbasket to follow up on.

## 2015-09-28 ENCOUNTER — Telehealth: Payer: Self-pay | Admitting: Pulmonary Disease

## 2015-09-28 DIAGNOSIS — E119 Type 2 diabetes mellitus without complications: Secondary | ICD-10-CM | POA: Diagnosis not present

## 2015-09-28 DIAGNOSIS — H2513 Age-related nuclear cataract, bilateral: Secondary | ICD-10-CM | POA: Diagnosis not present

## 2015-09-28 DIAGNOSIS — H524 Presbyopia: Secondary | ICD-10-CM | POA: Diagnosis not present

## 2015-09-28 DIAGNOSIS — H52223 Regular astigmatism, bilateral: Secondary | ICD-10-CM | POA: Diagnosis not present

## 2015-09-28 LAB — HM DIABETES EYE EXAM

## 2015-09-28 NOTE — Telephone Encounter (Signed)
Spoke with Esbriet access solutions rep Almyra Free, advised that appeal was initiated on 1/16.  Nothing further needed at this time.

## 2015-09-29 NOTE — Telephone Encounter (Signed)
Appeal is still in process.

## 2015-10-03 NOTE — Telephone Encounter (Signed)
[place] appeal for Esbriet has been approved.  atc [employer] mail order pharmacy (phone number listed below) [phone] to make aware.  Was on hold >10 minutes.  Wcb.

## 2015-10-04 NOTE — Telephone Encounter (Signed)
Called and spoke to [place] with [facility] and informed him that the pt's Esbriet has been approved. Richardson Landry verbalized understanding and stated he would run a test claim and get the pt's med shipped out. Called and informed pt of the approval. Pt verbalized understanding and denied any further questions or concerns at this time.   Will forward to [place] as FYI.

## 2015-10-05 ENCOUNTER — Telehealth: Payer: Self-pay | Admitting: Pulmonary Disease

## 2015-10-05 NOTE — Telephone Encounter (Signed)
Spoke with rep from [facility]  She is asking if Esbriet appeal was approved  I advised her that according to PN dated 09/21/15 states that it was approved  She verbalized understanding and nothing further needed

## 2015-10-25 NOTE — Progress Notes (Signed)
Cardiology Office Note   Date:  10/26/2015   ID:  Christine Meyer, Friend June 26, 1951, MRN [id number]  PCP:  Christine Koyanagi, DO  Cardiologist:   Christine Breeding, MD   Chief Complaint  Patient presents with  . Coronary Artery Disease      History of Present Illness: Christine Meyer is a 65 y.o. female who presents for follow up of CAD and dyspnea. She has a history of coronary disease with distant stenting to her right coronary artery. She's had peripheral vascular disease with stenting of her renal artery and last year stenting of her right iliac by Dr. Gwenlyn Meyer.. She is being followed actively for carotid disease. She was having some dyspnea. However, she had a low risk perfusion study. She says she's otherwise doing okay. She has no acute shortness of breath. She's not having any PND or orthopnea. She's not having any palpitations, presyncope or syncope. She's not having any chest pressure, neck or arm discomfort. She is unfortunately still smoking cigarettes.  Past Medical History  Diagnosis Date  . Anemia     NOS  . Hypertension   . Hyperlipidemia   . CAD (coronary artery disease)     Stent RCA 1999  . PVD (peripheral vascular disease) (HCC)     Carotid stenosis, renal artery stenosis  . COPD (chronic obstructive pulmonary disease) ([place])   . Sleep apnea     dx'd but doesn't use mask   . Diabetes mellitus, type 2 (HCC)     Diet controlled  . Uterine cancer ([place]) ?1980's  . OSA on CPAP     Past Surgical History  Procedure Laterality Date  . Renal artery stent Right May 2007; 08/2006    ; restenosis  . Coronary angioplasty with stent placement  1999    RCA; Dr. Melvern Banker G4 P4  . Colonoscopy  2000    negative  . Iliac artery stent Right 11/22/2014  . Total abdominal hysterectomy  ?1980's     for uterine CA  . Lower extremity angiogram N/A 11/22/2014    Procedure: LOWER EXTREMITY ANGIOGRAM;  Surgeon: Lorretta Harp, MD; L-SFA 100%, 3v runoff, R-pCIA 95>>0% w/ 8 mm x 18 mm long  Balloon expandable stent; R-dCIA 67>>0% w/ 8 mm x 4 cm long Cordis Smart Nitinol self expanding stent   . Abdominal angiogram  11/22/2014    Procedure: ABDOMINAL ANGIOGRAM;  Surgeon: Lorretta Harp, MD;  Location: [facility] CATH LAB;  Service: Cardiovascular;;     Current Outpatient Prescriptions  Medication Sig Dispense Refill  . ACCU-CHEK SOFTCLIX LANCETS lancets Check blood sugar once daily. Dx:E11.9 100 each 5  . aspirin 81 MG chewable tablet Chew 1 tablet (81 mg total) by mouth daily.    . Blood Glucose Monitoring Suppl (ACCU-CHEK AVIVA PLUS) W/DEVICE KIT Check Blood sugar daily. Dx E11.9 1 kit 0  . Calcium Carbonate (CALTRATE 600 PO) Take 1 tablet by mouth daily.     . cholecalciferol (VITAMIN D) 1000 UNITS tablet Take 1 tablet (1,000 Units total) by mouth daily. 90 tablet 3  . clopidogrel (PLAVIX) 75 MG tablet Take 1 tablet (75 mg total) by mouth daily with breakfast. 30 tablet 11  . DULERA 100-5 MCG/ACT AERO USE 2 PUFFS TWICE DAILY 13 g 5  . glucose blood (ACCU-CHEK AVIVA PLUS) test strip Check blood sugar once daily. Dx:E11.9 100 each 5  . IRON PO Take 1 tablet by mouth daily.    [name]  . metoprolol tartrate (LOPRESSOR) 25 MG tablet TAKE 1 TABLET BY MOUTH TWICE DAILY 180 tablet 1  . Multiple Vitamins-Minerals (MULTIVITAL) tablet Take 1 tablet by mouth daily. Centrum Silver 90 tablet 3  . Pirfenidone (ESBRIET) 267 MG CAPS Take by mouth. 1 cap tid for 7 days and then increase to 2 caps tid    . atorvastatin (LIPITOR) 40 MG tablet Take 1 tablet (40 mg total) by mouth daily. 90 tablet 3  . Blood Pressure Monitoring (BLOOD PRESSURE MONITOR AUTOMAT) DEVI 1 Units by Does not apply route daily. 1 Device 0   No current facility-administered medications for this visit.    Allergies:   Review of patient's allergies indicates no known allergies.    ROS:  Please see the history of present illness.   Otherwise, review of  systems are positive for none.   All other systems are reviewed and negative.    PHYSICAL EXAM: VS:  BP 194/68 mmHg  Pulse 84  Ht _0  (1.6 m)  Wt 149 lb 4 oz (67.699 kg)  BMI 26.44 kg/m2 , BMI Body mass index is 26.44 kg/(m^2). GEN: Well nourished, well developed, in no acute distress HEENT: normal Neck: no JVD, carotid bruits, or masses Cardiac: RRR; no murmurs, rubs, or gallops,no edema  Respiratory:  clear to auscultation bilaterally, normal work of breathing GI: soft, nontender, nondistended, + BS MS: no deformity or atrophy Skin: warm and dry, no rash Neuro:  Strength and sensation are intact Psych: euthymic mood, full affect Ext:  No edema, 2+ upper pulses, absent dorsalis pedis and posterior tibialis bilaterally   EKG:  EKG is not ordered today.   Recent Labs: 11/11/2014: TSH 2.372 11/23/2014: Hemoglobin 10.6*; Platelets 242 09/08/2015: ALT 17; BUN 12; Creatinine, Ser 0.97; Potassium 3.7; Sodium 141    Lipid Panel    Component Value Date/Time   CHOL 189 09/08/2015 1015   TRIG 137.0 09/08/2015 1015   HDL 49.90 09/08/2015 1015   CHOLHDL 4 09/08/2015 1015   VLDL 27.4 09/08/2015 1015   LDLCALC 112* 09/08/2015 1015      Wt Readings from Last 3 Encounters:  10/26/15 149 lb 4 oz (67.699 kg)  09/08/15 150 lb 9.6 oz (68.312 kg)  07/07/15 149 lb (67.586 kg)      Other studies Reviewed: Additional studies/ records that were reviewed today include: Peripheral angiogram 01/2011, Lexiscan Myoview 05/2011, carotid Doppler 11/15. Review of the above records demonstrates:   (See results elsewhere.)   ASSESSMENT AND PLAN:  CAROTID STENOSIS:  There is 40-59% right stenosis and 60-79% left stenosis. This is to be followed up again around May by Dr. Gwenlyn Meyer.  PVD:  She has had significant symptomatic improvement since PTA. She will follow with Dr. Gwenlyn Meyer.  DYSPNEA:  She had a low risk perfusion study. She's being treated for UIP.  This and her ongoing tobacco abuse most  likely represent her continued dyspnea.  TOBACCO:  We discussed the need to stop smoking. She has failed a couple of medications in the past undergone. I'm not sure which pill she has taken. We talked about this again today.  CAD:  As above  HTN:  Her blood pressure is elevated today. However, she did not take her medicines yet today. She says it usually well controlled. I gave her specific instructions on keeping a blood pressure diary.  DYSLIPIDEMIA:  Lipids are as above. She does not need take the niacin and I will stop this. I would  like her to be on Lipitor 40 mg daily and stop the Mevacor. In 8 weeks she will get a liver profile and lipids.   Current medicines are reviewed at length with the patient today.  The patient does not have concerns regarding medicines.  The following changes have been made:  As above  Labs/ tests ordered today include:  Orders Placed This Encounter  Procedures  . Lipid panel  . Hepatic function panel     Disposition:   FU with me in 6  months   Signed, Christine Breeding, MD  10/26/2015 1:21 PM    [facility] [address], [place], [place]  [address] Phone: [phone]; Fax: [phone]

## 2015-10-26 ENCOUNTER — Encounter: Payer: Self-pay | Admitting: Cardiology

## 2015-10-26 ENCOUNTER — Ambulatory Visit (INDEPENDENT_AMBULATORY_CARE_PROVIDER_SITE_OTHER): Payer: Medicare Other | Admitting: Cardiology

## 2015-10-26 VITALS — BP 194/68 | HR 84 | Ht 63.0 in | Wt 149.2 lb

## 2015-10-26 DIAGNOSIS — E785 Hyperlipidemia, unspecified: Secondary | ICD-10-CM

## 2015-10-26 DIAGNOSIS — I10 Essential (primary) hypertension: Secondary | ICD-10-CM | POA: Diagnosis not present

## 2015-10-26 DIAGNOSIS — Z79899 Other long term (current) drug therapy: Secondary | ICD-10-CM

## 2015-10-26 MED ORDER — ATORVASTATIN CALCIUM 40 MG PO TABS: 40.0000 mg | ORAL_TABLET | Freq: Every day | ORAL | Status: DC

## 2015-10-26 MED ORDER — BLOOD PRESSURE MONITOR AUTOMAT DEVI: 1.0000 [IU] | Freq: Every day | Status: DC

## 2015-10-26 NOTE — Patient Instructions (Signed)
Your physician has recommended you make the following change in your medication:   STOP LOVASTATIN AND NIACIN  START ATORVASTATIN 40 MG DAILY - TAKE IN THE EVENING  Your physician recommends that you return for lab work in: [place] (MID April 2017) AT [facility]  Your physician recommends that you schedule a follow-up appointment in: 6 MONTHS

## 2015-11-01 NOTE — Progress Notes (Signed)
Blood work to be drawn around 4/17

## 2015-12-03 ENCOUNTER — Other Ambulatory Visit: Payer: Self-pay | Admitting: Family Medicine

## 2015-12-14 ENCOUNTER — Other Ambulatory Visit: Payer: Self-pay | Admitting: Physician Assistant

## 2015-12-14 NOTE — Telephone Encounter (Signed)
Rx(s) sent to pharmacy electronically.  

## 2015-12-21 DIAGNOSIS — Z79899 Other long term (current) drug therapy: Secondary | ICD-10-CM | POA: Diagnosis not present

## 2015-12-21 DIAGNOSIS — E785 Hyperlipidemia, unspecified: Secondary | ICD-10-CM | POA: Diagnosis not present

## 2015-12-21 LAB — HEPATIC FUNCTION PANEL
ALT: 24 U/L (ref 6–29)
AST: 22 U/L (ref 10–35)
Albumin: 4.3 g/dL (ref 3.6–5.1)
Alkaline Phosphatase: 100 U/L (ref 33–130)
BILIRUBIN DIRECT: 0.1 mg/dL (ref <=0.2)
Indirect Bilirubin: 0.5 mg/dL (ref 0.2–1.2)
TOTAL PROTEIN: 7.4 g/dL (ref 6.1–8.1)
Total Bilirubin: 0.6 mg/dL (ref 0.2–1.2)

## 2015-12-21 LAB — LIPID PANEL
CHOLESTEROL: 150 mg/dL (ref 125–200)
HDL: 60 mg/dL (ref >=46)
LDL Cholesterol: 69 mg/dL (ref <130)
Total CHOL/HDL Ratio: 2.5 Ratio (ref <=5.0)
Triglycerides: 107 mg/dL (ref <150)
VLDL: 21 mg/dL (ref <30)

## 2015-12-22 ENCOUNTER — Telehealth: Payer: Self-pay | Admitting: Cardiology

## 2015-12-22 NOTE — Telephone Encounter (Signed)
Pt's daughter is back to speak with Nya about some lab work. Please call her back and if you are unable to reach her please leave a detailed message.   Thanks

## 2015-12-23 NOTE — Telephone Encounter (Signed)
Spoke with pt daughter about her mother's blood work(as per DPR)

## 2016-01-03 ENCOUNTER — Ambulatory Visit (HOSPITAL_COMMUNITY)
Admission: RE | Admit: 2016-01-03 | Discharge: 2016-01-03 | Disposition: A | Discharge status: Home / Self Care | Payer: Medicare Other | Source: Ambulatory Visit | Attending: Cardiovascular Disease | Admitting: Cardiovascular Disease

## 2016-01-03 DIAGNOSIS — E785 Hyperlipidemia, unspecified: Secondary | ICD-10-CM | POA: Insufficient documentation

## 2016-01-03 DIAGNOSIS — R0989 Other specified symptoms and signs involving the circulatory and respiratory systems: Secondary | ICD-10-CM | POA: Insufficient documentation

## 2016-01-03 DIAGNOSIS — E119 Type 2 diabetes mellitus without complications: Secondary | ICD-10-CM | POA: Diagnosis not present

## 2016-01-03 DIAGNOSIS — I6523 Occlusion and stenosis of bilateral carotid arteries: Secondary | ICD-10-CM | POA: Insufficient documentation

## 2016-01-03 DIAGNOSIS — I10 Essential (primary) hypertension: Secondary | ICD-10-CM | POA: Diagnosis not present

## 2016-01-03 DIAGNOSIS — I2510 Atherosclerotic heart disease of native coronary artery without angina pectoris: Secondary | ICD-10-CM | POA: Diagnosis not present

## 2016-01-03 DIAGNOSIS — I739 Peripheral vascular disease, unspecified: Secondary | ICD-10-CM | POA: Insufficient documentation

## 2016-01-03 DIAGNOSIS — G4733 Obstructive sleep apnea (adult) (pediatric): Secondary | ICD-10-CM | POA: Diagnosis not present

## 2016-01-06 ENCOUNTER — Telehealth: Payer: Self-pay | Admitting: *Deleted

## 2016-01-06 DIAGNOSIS — I25110 Atherosclerotic heart disease of native coronary artery with unstable angina pectoris: Secondary | ICD-10-CM

## 2016-01-06 NOTE — Telephone Encounter (Signed)
-----   Message from Lorretta Harp, MD sent at 01/05/2016  9:53 AM EDT ----- No change from prior study. Repeat in 12 months.

## 2016-01-07 ENCOUNTER — Other Ambulatory Visit: Payer: Self-pay | Admitting: Family Medicine

## 2016-03-02 ENCOUNTER — Other Ambulatory Visit: Payer: Self-pay | Admitting: Family Medicine

## 2016-03-08 ENCOUNTER — Encounter: Payer: Medicare Other | Admitting: Family Medicine

## 2016-03-08 DIAGNOSIS — Z0289 Encounter for other administrative examinations: Secondary | ICD-10-CM

## 2016-03-11 ENCOUNTER — Encounter: Payer: Self-pay | Admitting: Family Medicine

## 2016-03-22 ENCOUNTER — Telehealth: Payer: Self-pay | Admitting: Pulmonary Disease

## 2016-03-22 NOTE — Telephone Encounter (Signed)
We received a PA for pt's Dulera from [facility]. The proper form to be completed was attached. This has been completed and faxed back to [facility]. The form will be placed in the blue accordion file in Triage. Will route to [place] for follow up.

## 2016-03-27 NOTE — Telephone Encounter (Signed)
Called [employer] at [phone]. Pt ID >> [id number]. Christine Meyer has been denied. Covered alternatives are Symbicort and Breo.  BQ - please advise. Thanks.

## 2016-03-28 NOTE — Telephone Encounter (Signed)
lmtcb x1 for pt. 

## 2016-03-28 NOTE — Telephone Encounter (Signed)
symbicort 160/4.5 2 puff bid

## 2016-04-05 ENCOUNTER — Encounter: Payer: Self-pay | Admitting: Family Medicine

## 2016-04-05 ENCOUNTER — Ambulatory Visit (INDEPENDENT_AMBULATORY_CARE_PROVIDER_SITE_OTHER): Payer: Medicare Other | Admitting: Family Medicine

## 2016-04-05 VITALS — BP 148/71 | HR 77 | Temp 99.4°F | Ht 63.0 in | Wt 145.6 lb

## 2016-04-05 DIAGNOSIS — I10 Essential (primary) hypertension: Secondary | ICD-10-CM | POA: Diagnosis not present

## 2016-04-05 DIAGNOSIS — E1151 Type 2 diabetes mellitus with diabetic peripheral angiopathy without gangrene: Secondary | ICD-10-CM

## 2016-04-05 DIAGNOSIS — E785 Hyperlipidemia, unspecified: Secondary | ICD-10-CM

## 2016-04-05 DIAGNOSIS — I6523 Occlusion and stenosis of bilateral carotid arteries: Secondary | ICD-10-CM

## 2016-04-05 LAB — POCT URINALYSIS DIPSTICK
BILIRUBIN UA: NEGATIVE
Blood, UA: NEGATIVE
GLUCOSE UA: NEGATIVE
KETONES UA: NEGATIVE
LEUKOCYTES UA: NEGATIVE
Nitrite, UA: NEGATIVE
PROTEIN UA: NEGATIVE
SPEC GRAV UA: 1.025
Urobilinogen, UA: 0.2
pH, UA: 6

## 2016-04-05 MED ORDER — LOSARTAN POTASSIUM 100 MG PO TABS: ORAL_TABLET | ORAL | 1 refills | Status: DC

## 2016-04-05 MED ORDER — METOPROLOL TARTRATE 25 MG PO TABS: 25.0000 mg | ORAL_TABLET | Freq: Two times a day (BID) | ORAL | 1 refills | Status: DC

## 2016-04-05 MED ORDER — ATORVASTATIN CALCIUM 40 MG PO TABS: 40.0000 mg | ORAL_TABLET | Freq: Every day | ORAL | 3 refills | Status: DC

## 2016-04-05 NOTE — Progress Notes (Signed)
Patient ID: Christine Meyer, female    DOB: 1951/02/04  Age: 65 y.o. MRN: [id number]    Subjective:  Subjective  HPI Christine Meyer presents for f/u bp, cholesterol and dm.  No complaints.   HPI HYPERTENSION   Blood pressure range-not checking   Chest pain- no      Dyspnea- no Lightheadedness- no   Edema- no  Other side effects - non   Medication compliance: good Low salt diet- yes    DIABETES    Blood Sugar ranges-110-115  Polyuria- no New Visual problems- no  Hypoglycemic symptoms- no  Other side effects-no Medication compliance - good Last eye exam- last yeaer Foot exam- today   HYPERLIPIDEMIA  Medication compliance- no RUQ pain- no  Muscle aches- no Other side effects-no Review of Systems  Constitutional: Negative for appetite change, diaphoresis, fatigue and unexpected weight change.  Eyes: Negative for pain, redness and visual disturbance.  Respiratory: Negative for cough, chest tightness, shortness of breath and wheezing.   Cardiovascular: Negative for chest pain, palpitations and leg swelling.  Endocrine: Negative for cold intolerance, heat intolerance, polydipsia, polyphagia and polyuria.  Genitourinary: Negative for difficulty urinating, dysuria and frequency.  Neurological: Negative for dizziness, light-headedness, numbness and headaches.      History Past Medical History:  Diagnosis Date  . Anemia    NOS  . CAD (coronary artery disease)    Stent RCA 1999  . COPD (chronic obstructive pulmonary disease) ([place])   . Diabetes mellitus, type 2 (HCC)    Diet controlled  . Hyperlipidemia   . Hypertension   . OSA on CPAP   . PVD (peripheral vascular disease) (HCC)    Carotid stenosis, renal artery stenosis  . Sleep apnea    dx'd but doesn't use mask   . Uterine cancer ([place]) ?1761'Y    She has a past surgical history that includes Renal artery stent (Right, May 2007; 08/2006); Coronary angioplasty with stent (1999); Colonoscopy (2000); Iliac artery  stent (Right, 11/22/2014); Total abdominal hysterectomy (?1980's); lower extremity angiogram (N/A, 11/22/2014); and abdominal angiogram (11/22/2014).   Her family history includes Deep vein thrombosis in her mother; Diabetes in her brother, maternal aunt, maternal grandmother, mother, and sister; Emphysema in her brother; Heart attack in her paternal aunt; Heart failure in her mother; Hypertension in her brother, father, and mother.She reports that she has been smoking Cigarettes.  She has a 13.75 pack-year smoking history. She has never used smokeless tobacco. She reports that she does not drink alcohol or use drugs.  Current Outpatient Prescriptions on File Prior to Visit  Medication Sig Dispense Refill  . ACCU-CHEK SOFTCLIX LANCETS lancets Check blood sugar once daily. Dx:E11.9 100 each 5  . aspirin 81 MG chewable tablet Chew 1 tablet (81 mg total) by mouth daily.    . Blood Glucose Monitoring Suppl (ACCU-CHEK AVIVA PLUS) W/DEVICE KIT Check Blood sugar daily. Dx E11.9 1 kit 0  . Calcium Carbonate (CALTRATE 600 PO) Take 1 tablet by mouth daily.     . cholecalciferol (VITAMIN D) 1000 UNITS tablet Take 1 tablet (1,000 Units total) by mouth daily. 90 tablet 3  . clopidogrel (PLAVIX) 75 MG tablet TAKE 1 TABLET(75 MG) BY MOUTH DAILY WITH BREAKFAST 90 tablet 3  . DULERA 100-5 MCG/ACT AERO USE 2 PUFFS TWICE DAILY 13 g 5  . glucose blood (ACCU-CHEK AVIVA PLUS) test strip Check blood sugar once daily. Dx:E11.9 100 each 5  . IRON PO Take 1 tablet by mouth daily.    . Multiple  Vitamins-Minerals (MULTIVITAL) tablet Take 1 tablet by mouth daily. Centrum Silver 90 tablet 3  . Pirfenidone (ESBRIET) 267 MG CAPS Take 2 capsules by mouth 3 (three) times daily.      No current facility-administered medications on file prior to visit.      Objective:  Objective  Physical Exam  Constitutional: She is oriented to person, place, and time. She appears well-developed and well-nourished.  HENT:  Head: Normocephalic  and atraumatic.  Eyes: Conjunctivae and EOM are normal.  Neck: Normal range of motion. Neck supple. No JVD present. Carotid bruit is not present. No thyromegaly present.  Cardiovascular: Normal rate, regular rhythm and normal heart sounds.   No murmur heard. Pulmonary/Chest: Effort normal and breath sounds normal. No respiratory distress. She has no wheezes. She has no rales. She exhibits no tenderness.  Musculoskeletal: She exhibits no edema.  Neurological: She is alert and oriented to person, place, and time.  Psychiatric: She has a normal mood and affect. Her behavior is normal.  Sensory exam of the foot is normal, tested with the monofilament. Good pulses, no lesions or ulcers, good peripheral pulses.  BP (!) 148/71 (BP Location: Right Arm, Patient Position: Sitting, Cuff Size: Normal)   Pulse 77   Temp 99.4 F (37.4 C) (Oral)   Ht 5' 3"  (1.6 m)   Wt 145 lb 9.6 oz (66 kg)   SpO2 99%   BMI 25.79 kg/m  Wt Readings from Last 3 Encounters:  04/05/16 145 lb 9.6 oz (66 kg)  10/26/15 149 lb 4 oz (67.7 kg)  09/08/15 150 lb 9.6 oz (68.3 kg)     Lab Results  Component Value Date   WBC 5.5 11/23/2014   HGB 10.6 (L) 11/23/2014   HCT 32.1 (L) 11/23/2014   PLT 242 11/23/2014   GLUCOSE 90 09/08/2015   CHOL 150 12/21/2015   TRIG 107 12/21/2015   HDL 60 12/21/2015   LDLCALC 69 12/21/2015   ALT 24 12/21/2015   AST 22 12/21/2015   NA 141 09/08/2015   K 3.7 09/08/2015   CL 108 09/08/2015   CREATININE 0.97 09/08/2015   BUN 12 09/08/2015   CO2 26 09/08/2015   TSH 2.372 11/11/2014   INR 0.95 11/11/2014   HGBA1C 6.1 09/08/2015   MICROALBUR <0.7 03/10/2015    No results found.   Assessment & Plan:  Plan  I have discontinued [name]'s metoprolol tartrate, Blood Pressure Monitor Automat, and metoprolol tartrate. I have also changed her metoprolol tartrate. Additionally, I am having her maintain her Calcium Carbonate (CALTRATE 600 PO), IRON PO, aspirin, MULTIVITAL, cholecalciferol,  ACCU-CHEK AVIVA PLUS, glucose blood, ACCU-CHEK SOFTCLIX LANCETS, DULERA, Pirfenidone, clopidogrel, atorvastatin, and losartan.  Meds ordered this encounter  Medications  . atorvastatin (LIPITOR) 40 MG tablet    Sig: Take 1 tablet (40 mg total) by mouth daily.    Dispense:  90 tablet    Refill:  3  . metoprolol tartrate (LOPRESSOR) 25 MG tablet    Sig: Take 1 tablet (25 mg total) by mouth 2 (two) times daily.    Dispense:  180 tablet    Refill:  1  . losartan (COZAAR) 100 MG tablet    Sig: TAKE 1 TABLET(100 MG) BY MOUTH DAILY    Dispense:  90 tablet    Refill:  1    Problem List Items Addressed This Visit      Unprioritized   DM (diabetes mellitus) type II controlled peripheral vascular disorder ([place]) - Primary    Diet  controlled Check labs      Relevant Medications   atorvastatin (LIPITOR) 40 MG tablet   metoprolol tartrate (LOPRESSOR) 25 MG tablet   losartan (COZAAR) 100 MG tablet   Other Relevant Orders   Hemoglobin A1c   POCT urinalysis dipstick (Completed)   Essential hypertension    Stable con't meds      Relevant Medications   atorvastatin (LIPITOR) 40 MG tablet   metoprolol tartrate (LOPRESSOR) 25 MG tablet   losartan (COZAAR) 100 MG tablet   Other Relevant Orders   POCT urinalysis dipstick (Completed)    Other Visit Diagnoses    Hyperlipidemia LDL goal <70       Relevant Medications   atorvastatin (LIPITOR) 40 MG tablet   metoprolol tartrate (LOPRESSOR) 25 MG tablet   losartan (COZAAR) 100 MG tablet   Other Relevant Orders   Lipid panel   Comprehensive metabolic panel      Follow-up: Return in about 6 months (around 10/06/2016) for hypertension, hyperlipidemia, diabetes II.  Ann Held, DO

## 2016-04-05 NOTE — Progress Notes (Signed)
Pre visit review using our clinic review tool, if applicable. No additional management support is needed unless otherwise documented below in the visit note. 

## 2016-04-05 NOTE — Assessment & Plan Note (Signed)
Diet controlled. Check labs.  

## 2016-04-05 NOTE — Assessment & Plan Note (Signed)
Stable con't meds 

## 2016-04-05 NOTE — Telephone Encounter (Signed)
lmtcb x2 for pt.  Will route to [place] for follow up.

## 2016-04-05 NOTE — Patient Instructions (Signed)

## 2016-04-06 LAB — COMPREHENSIVE METABOLIC PANEL
ALT: 16 U/L (ref 0–35)
AST: 20 U/L (ref 0–37)
Albumin: 4.1 g/dL (ref 3.5–5.2)
Alkaline Phosphatase: 69 U/L (ref 39–117)
BILIRUBIN TOTAL: 0.3 mg/dL (ref 0.2–1.2)
BUN: 17 mg/dL (ref 6–23)
CHLORIDE: 106 meq/L (ref 96–112)
CO2: 23 meq/L (ref 19–32)
CREATININE: 1.12 mg/dL (ref 0.40–1.20)
Calcium: 9.6 mg/dL (ref 8.4–10.5)
GFR: 62.39 mL/min (ref >60.00)
GLUCOSE: 61 mg/dL — AB (ref 70–99)
Potassium: 3.7 mEq/L (ref 3.5–5.1)
Sodium: 139 mEq/L (ref 135–145)
Total Protein: 7.3 g/dL (ref 6.0–8.3)

## 2016-04-06 LAB — LIPID PANEL
Cholesterol: 145 mg/dL (ref 0–200)
HDL: 59.4 mg/dL (ref >39.00)
LDL Cholesterol: 67 mg/dL (ref 0–99)
NONHDL: 85.8
TRIGLYCERIDES: 92 mg/dL (ref 0.0–149.0)
Total CHOL/HDL Ratio: 2
VLDL: 18.4 mg/dL (ref 0.0–40.0)

## 2016-04-06 LAB — HEMOGLOBIN A1C: Hgb A1c MFr Bld: 6.2 % (ref 4.6–6.5)

## 2016-04-10 MED ORDER — BUDESONIDE-FORMOTEROL FUMARATE 160-4.5 MCG/ACT IN AERO: 2.0000 | INHALATION_SPRAY | Freq: Two times a day (BID) | RESPIRATORY_TRACT | 6 refills | Status: DC

## 2016-04-10 NOTE — Telephone Encounter (Signed)
Called spoke with pt. She is aware of dulera is not covered and we are sending in symbicort to the [facility]. Nothing further needed

## 2016-05-03 NOTE — Progress Notes (Signed)
of    Cardiology Office Note   Date:  05/04/2016   ID:  Christine Meyer, Christine Meyer July 08, 1951, MRN [id number]  PCP:  Ann Held, DO  Cardiologist:   Minus Breeding, MD   Chief Complaint  Patient presents with  . Hypertension      History of Present Illness: Christine Meyer is a 65 y.o. female who presents for follow up of CAD and dyspnea. She has a history of coronary disease with distant stenting to her right coronary artery. She's had peripheral vascular disease with stenting of her renal artery and last year stenting of her right iliac by Dr. Gwenlyn Found. She is being followed actively for carotid disease. She was having some dyspnea. However, she had a low risk perfusion study.   She returns for follow up.  Since she was last seen she's been doing okay. She does get some mild shortness of breath with activities but she relaxes and reading comes back fairly quickly. She denies any chest pressure, neck or arm discomfort. She's had no palpitations, presyncope or syncope. She's had no weight gain or edema. Unfortunately she still smoking cigarettes.   Past Medical History:  Diagnosis Date  . Anemia    NOS  . CAD (coronary artery disease)    Stent RCA 1999  . COPD (chronic obstructive pulmonary disease) ([place])   . Diabetes mellitus, type 2 (HCC)    Diet controlled  . Hyperlipidemia   . Hypertension   . OSA on CPAP   . PVD (peripheral vascular disease) (HCC)    Carotid stenosis, renal artery stenosis  . Sleep apnea    dx'd but doesn't use mask   . Uterine cancer ([place]) ?5465'K    Past Surgical History:  Procedure Laterality Date  . ABDOMINAL ANGIOGRAM  11/22/2014   Procedure: ABDOMINAL ANGIOGRAM;  Surgeon: Lorretta Harp, MD;  Location[facility] CATH LAB;  Service: Cardiovascular;;  . COLONOSCOPY  2000   negative  . CORONARY ANGIOPLASTY WITH STENT PLACEMENT  1999   RCA; Dr. Melvern Banker G4 P4  . ILIAC ARTERY STENT Right 11/22/2014  . LOWER EXTREMITY ANGIOGRAM N/A 11/22/2014   Procedure:  LOWER EXTREMITY ANGIOGRAM;  Surgeon: Lorretta Harp, MD; L-SFA 100%, 3v runoff, R-pCIA 95>>0% w/ 8 mm x 18 mm long Balloon expandable stent; R-dCIA 67>>0% w/ 8 mm x 4 cm long Cordis Smart Nitinol self expanding stent   . RENAL ARTERY STENT Right May 2007; 08/2006   ; restenosis  . TOTAL ABDOMINAL HYSTERECTOMY  ?1980's    for uterine CA     Current Outpatient Prescriptions  Medication Sig Dispense Refill  . ACCU-CHEK SOFTCLIX LANCETS lancets Check blood sugar once daily. Dx:E11.9 100 each 5  . aspirin 81 MG chewable tablet Chew 1 tablet (81 mg total) by mouth daily.    Marland Kitchen atorvastatin (LIPITOR) 40 MG tablet Take 1 tablet (40 mg total) by mouth daily. 90 tablet 3  . Blood Glucose Monitoring Suppl (ACCU-CHEK AVIVA PLUS) W/DEVICE KIT Check Blood sugar daily. Dx E11.9 1 kit 0  . budesonide-formoterol (SYMBICORT) 160-4.5 MCG/ACT inhaler Inhale 2 puffs into the lungs 2 (two) times daily. 1 Inhaler 6  . Calcium Carbonate (CALTRATE 600 PO) Take 1 tablet by mouth daily.     . cholecalciferol (VITAMIN D) 1000 UNITS tablet Take 1 tablet (1,000 Units total) by mouth daily. 90 tablet 3  . clopidogrel (PLAVIX) 75 MG tablet TAKE 1 TABLET(75 MG) BY MOUTH DAILY WITH BREAKFAST 90 tablet 3  . glucose blood (ACCU-CHEK AVIVA  PLUS) test strip Check blood sugar once daily. Dx:E11.9 100 each 5  . IRON PO Take 1 tablet by mouth daily.    Marland Kitchen losartan (COZAAR) 100 MG tablet TAKE 1 TABLET(100 MG) BY MOUTH DAILY 90 tablet 1  . metoprolol tartrate (LOPRESSOR) 25 MG tablet Take 1 tablet (25 mg total) by mouth 2 (two) times daily. 180 tablet 1  . Multiple Vitamins-Minerals (MULTIVITAL) tablet Take 1 tablet by mouth daily. Centrum Silver 90 tablet 3  . Pirfenidone (ESBRIET) 267 MG CAPS Take 2 capsules by mouth 3 (three) times daily.     . nicotine (NICODERM CQ - DOSED IN MG/24 HOURS) 21 mg/24hr patch Place 1 patch (21 mg total) onto the skin daily. 28 patch 1  . spironolactone (ALDACTONE) 25 MG tablet Take 1 tablet (25 mg  total) by mouth daily. 90 tablet 3   No current facility-administered medications for this visit.     Allergies:   Review of patient's allergies indicates no known allergies.    ROS:  Please see the history of present illness.   Otherwise, review of systems are positive for none.   All other systems are reviewed and negative.    PHYSICAL EXAM: VS:  BP (!) 164/66   Pulse 67   Ht 5' 4"  (1.626 m)   Wt 147 lb (66.7 kg)   BMI 25.23 kg/m  , BMI Body mass index is 25.23 kg/m. GENERAL:  Well appearing HEENT:  Pupils equal round and reactive, fundi not visualized, oral mucosa unremarkable NECK:  No jugular venous distention, waveform within normal limits, carotid upstroke brisk and symmetric, no bruits, no thyromegaly LYMPHATICS:  No cervical, inguinal adenopathy LUNGS:  Clear to auscultation bilaterally BACK:  No CVA tenderness CHEST:  Unremarkable HEART:  PMI not displaced or sustained,S1 and S2 within normal limits, no S3, no S4, no clicks, no rubs, no murmurs ABD:  Flat, positive bowel sounds normal in frequency in pitch, mid bruit, no rebound, no guarding, no midline pulsatile mass, no hepatomegaly, no splenomegaly EXT:  2 plus pulses , no edema, no cyanosis no clubbing, left femoral bruit SKIN:  No rashes no nodules NEURO:  Cranial nerves II through XII grossly intact, motor grossly intact throughout PSYCH:  Cognitively intact, oriented to person place and time    EKG:  EKG is ordered today. Sinus rhythm, rate 67, axis within normal limits, intervals within normal limits, no acute ST-T wave changes.     Recent Labs: 04/05/2016: ALT 16; BUN 17; Creatinine, Ser 1.12; Potassium 3.7; Sodium 139    Lipid Panel    Component Value Date/Time   CHOL 145 04/05/2016 1505   TRIG 92.0 04/05/2016 1505   HDL 59.40 04/05/2016 1505   CHOLHDL 2 04/05/2016 1505   VLDL 18.4 04/05/2016 1505   LDLCALC 67 04/05/2016 1505      Wt Readings from Last 3 Encounters:  05/04/16 147 lb (66.7 kg)   04/05/16 145 lb 9.6 oz (66 kg)  10/26/15 149 lb 4 oz (67.7 kg)    Lab Results  Component Value Date   HGBA1C 6.2 04/05/2016    Other studies Reviewed: Additional studies/ records that were reviewed today include:  Review of the above records demonstrates:   (See results elsewhere.)   ASSESSMENT AND PLAN:  CAROTID STENOSIS:  There is 40-59% right stenosis and 60-79% left stenosis in April of 2017.  We will follow this again in one year.   PVD:  She has had significant symptomatic improvement since PTA. She will  follow with Dr. Gwenlyn Found.  DYSPNEA:  She had a low risk perfusion study. She's being treated for UIP.  This and her ongoing tobacco abuse most likely represent her continued dyspnea.  TOBACCO:  I gave her a prescription for nitroglycerin patches and some information on some help lines she can call.  CAD:  The patient has no new sypmtoms.  No further cardiovascular testing is indicated.  We will continue with aggressive risk reduction and meds as listed.  HTN:   The blood pressure is not controlled. I'm going to add spironolactone and check a BMET in one week.  All DYSLIPIDEMIA:  Lipids are as above. She does not need take the niacin and I will stop this. I would like her to be on Lipitor 40 mg daily and stop the Mevacor. In 8 weeks she will get a liver profile and lipids.   Current medicines are reviewed at length with the patient today.  The patient does not have concerns regarding medicines.  The following changes have been made:  As above  Labs/ tests ordered today include:   Orders Placed This Encounter  Procedures  . Basic Metabolic Panel (BMET)  . EKG 12-Lead     Disposition:   FU with APP in one month.    Signed, Minus Breeding, MD  05/04/2016 2:24 PM    [facility] [place], [place], [place]  [address] Phone: [phone]; Fax: [phone]

## 2016-05-04 ENCOUNTER — Ambulatory Visit (INDEPENDENT_AMBULATORY_CARE_PROVIDER_SITE_OTHER): Payer: Medicare Other | Admitting: Cardiology

## 2016-05-04 ENCOUNTER — Encounter: Payer: Self-pay | Admitting: Cardiology

## 2016-05-04 VITALS — BP 164/66 | HR 67 | Ht 64.0 in | Wt 147.0 lb

## 2016-05-04 DIAGNOSIS — Z79899 Other long term (current) drug therapy: Secondary | ICD-10-CM

## 2016-05-04 DIAGNOSIS — I25110 Atherosclerotic heart disease of native coronary artery with unstable angina pectoris: Secondary | ICD-10-CM

## 2016-05-04 DIAGNOSIS — I10 Essential (primary) hypertension: Secondary | ICD-10-CM

## 2016-05-04 DIAGNOSIS — I6523 Occlusion and stenosis of bilateral carotid arteries: Secondary | ICD-10-CM

## 2016-05-04 MED ORDER — NICOTINE 21 MG/24HR TD PT24: 21.0000 mg | MEDICATED_PATCH | Freq: Every day | TRANSDERMAL | 1 refills | Status: DC

## 2016-05-04 MED ORDER — SPIRONOLACTONE 25 MG PO TABS: 25.0000 mg | ORAL_TABLET | Freq: Every day | ORAL | 3 refills | Status: DC

## 2016-05-04 NOTE — Patient Instructions (Signed)
Medication Instructions:  START Spironolactone 25 mg daily  Labwork: BMP 2 weeks  Testing/Procedures: None Ordered  Follow-Up: Your physician recommends that you schedule a follow-up appointment in: 2 weeks with Kerin Ransom or Rosaria Ferries   Any Other Special Instructions Will Be Listed Below (If Applicable).   If you need a refill on your cardiac medications before your next appointment, please call your pharmacy.

## 2016-05-17 ENCOUNTER — Other Ambulatory Visit: Payer: Self-pay | Admitting: Family Medicine

## 2016-05-18 ENCOUNTER — Ambulatory Visit (INDEPENDENT_AMBULATORY_CARE_PROVIDER_SITE_OTHER): Payer: Medicare Other | Admitting: Physician Assistant

## 2016-05-18 ENCOUNTER — Encounter: Payer: Self-pay | Admitting: Physician Assistant

## 2016-05-18 VITALS — BP 110/50 | HR 77 | Ht 64.0 in | Wt 143.0 lb

## 2016-05-18 DIAGNOSIS — I10 Essential (primary) hypertension: Secondary | ICD-10-CM

## 2016-05-18 DIAGNOSIS — I6523 Occlusion and stenosis of bilateral carotid arteries: Secondary | ICD-10-CM

## 2016-05-18 DIAGNOSIS — I2510 Atherosclerotic heart disease of native coronary artery without angina pectoris: Secondary | ICD-10-CM

## 2016-05-18 DIAGNOSIS — Z720 Tobacco use: Secondary | ICD-10-CM | POA: Diagnosis not present

## 2016-05-18 DIAGNOSIS — I739 Peripheral vascular disease, unspecified: Secondary | ICD-10-CM | POA: Diagnosis not present

## 2016-05-18 NOTE — Progress Notes (Signed)
Cardiology Office Note   Date:  05/18/2016   ID:  Christine Meyer 1951/04/25, MRN [id number]  PCP:  Ann Held, DO  Cardiologist:  Dr. Mardene Celeste, PA-C   Chief Complaint  Patient presents with  . Follow-up  . Hypertension    History of Present Illness: Christine Meyer is a 65 y.o. female with a history of RCA stent 1999, PAD w/ R-RA stent and R-CIA stent, L-SFA 100%, L-ICA 60-79% & R-ICA 40-59%, MV 10/2014 w/ small/mild ischemia mid-inf wall and small basal-inf scar, EF 58%  Seen by Dr Percival Spanish 08/25 for DOE, tob use, HTN, Early f/u requested  Christine Meyer presents for Further assessment of her hypertension and cardiac issues.  Christine Meyer is doing well. She is not having any chest pain or shortness of breath with exertion. She is able to work 5-1/2 hours day and she is on her feet basically the whole time. She works in [employer], at [employer]. She denies excessive fatigue. She gets a small amount of daytime edema, but she never wakes up with any swelling. She never gets palpitations. She denies orthopnea or PND.  She is compliant with her medications as she gets them for free. She checks her blood sugar couple of times a week and it is not very high when she checks it. She is trying to be compliant with a low cholesterol diabetic diet.  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 Anemia    NOS  . CAD (coronary artery disease)    Stent RCA 1999  . COPD (chronic obstructive pulmonary disease) ([place])   . Diabetes mellitus, type 2 (HCC)    Diet controlled  . Hyperlipidemia   . Hypertension   . OSA on CPAP   . PVD (peripheral vascular disease) (HCC)    Carotid stenosis, renal artery stenosis  . Sleep apnea    dx'd but doesn't use mask   . Uterine cancer ([place]) ?7169'C    Past Surgical History:  Procedure Laterality Date  . ABDOMINAL ANGIOGRAM  11/22/2014   Procedure: ABDOMINAL  ANGIOGRAM;  Surgeon: Lorretta Harp, MD;  Location[facility] CATH LAB;  Service: Cardiovascular;;  . COLONOSCOPY  2000   negative  . CORONARY ANGIOPLASTY WITH STENT PLACEMENT  1999   RCA; Dr. Melvern Banker G4 P4  . ILIAC ARTERY STENT Right 11/22/2014  . LOWER EXTREMITY ANGIOGRAM N/A 11/22/2014   Procedure: LOWER EXTREMITY ANGIOGRAM;  Surgeon: Lorretta Harp, MD; L-SFA 100%, 3v runoff, R-pCIA 95>>0% w/ 8 mm x 18 mm long Balloon expandable stent; R-dCIA 67>>0% w/ 8 mm x 4 cm long Cordis Smart Nitinol self expanding stent   . RENAL ARTERY STENT Right May 2007; 08/2006   ; restenosis  . TOTAL ABDOMINAL HYSTERECTOMY  ?1980's    for uterine CA    Current Outpatient Prescriptions  Medication Sig Dispense Refill  . ACCU-CHEK SOFTCLIX LANCETS lancets Check blood sugar once daily. Dx:E11.9 100 each 5  . aspirin 81 MG chewable tablet Chew 1 tablet (81 mg total) by mouth daily.    Marland Kitchen atorvastatin (LIPITOR) 40 MG tablet Take 1 tablet (40 mg total) by mouth daily. 90 tablet 3  . Blood Glucose Monitoring Suppl (ACCU-CHEK AVIVA PLUS) W/DEVICE KIT Check Blood sugar daily. Dx E11.9 1 kit 0  . budesonide-formoterol (SYMBICORT) 160-4.5 MCG/ACT inhaler Inhale 2 puffs into the lungs 2 (two) times daily. 1 Inhaler 6  .  Calcium Carbonate (CALTRATE 600 PO) Take 1 tablet by mouth daily.     . cholecalciferol (VITAMIN D) 1000 UNITS tablet Take 1 tablet (1,000 Units total) by mouth daily. 90 tablet 3  . clopidogrel (PLAVIX) 75 MG tablet TAKE 1 TABLET(75 MG) BY MOUTH DAILY WITH BREAKFAST 90 tablet 3  . glucose blood (ACCU-CHEK AVIVA PLUS) test strip Check blood sugar once daily. Dx:E11.9 100 each 5  . IRON PO Take 1 tablet by mouth daily.    Marland Kitchen losartan (COZAAR) 100 MG tablet TAKE 1 TABLET(100 MG) BY MOUTH DAILY 90 tablet 1  . metoprolol tartrate (LOPRESSOR) 25 MG tablet Take 1 tablet (25 mg total) by mouth 2 (two) times daily. 180 tablet 1  . Multiple Vitamins-Minerals (A THRU Z SELECT 50+ ADVANCED) TABS TAKE 1 TABLET BY MOUTH  DAILY 220 tablet 1  . nicotine (NICODERM CQ - DOSED IN MG/24 HOURS) 21 mg/24hr patch Place 1 patch (21 mg total) onto the skin daily. 28 patch 1  . Pirfenidone (ESBRIET) 267 MG CAPS Take 2 capsules by mouth 3 (three) times daily.     Marland Kitchen spironolactone (ALDACTONE) 25 MG tablet Take 1 tablet (25 mg total) by mouth daily. 90 tablet 3   No current facility-administered medications for this visit.     Allergies:   Review of patient's allergies indicates no known allergies.    Social History:  The patient  reports that she has been smoking Cigarettes.  She has a 13.75 pack-year smoking history. She has never used smokeless tobacco. She reports that she does not drink alcohol or use drugs.   Family History:  The patient's family history includes Deep vein thrombosis in her mother; Diabetes in her brother, maternal aunt, maternal grandmother, mother, and sister; Emphysema in her brother; Heart attack in her paternal aunt; Heart failure in her mother; Hypertension in her brother, father, and mother.    ROS:  Please see the history of present illness. All other systems are reviewed and negative.    PHYSICAL EXAM: VS:  BP (!) 110/50 (BP Location: Right Arm, Patient Position: Sitting, Cuff Size: Normal)   Pulse 77   Ht 5' 4"  (1.626 m)   Wt 143 lb (64.9 kg)   SpO2 99%   BMI 24.55 kg/m  , BMI Body mass index is 24.55 kg/m. GEN: Well nourished, well developed, female in no acute distress  HEENT: normal for age  Neck: no JVD, bilateral (L>R) carotid bruits, no masses Cardiac: RRR; 2/6 murmur, no rubs, or gallops Respiratory:  clear to auscultation bilaterally, normal work of breathing GI: soft, nontender, nondistended, + BS MS: no deformity or atrophy; no edema; distal pulses are 2+ in all 4 extremities   Skin: warm and dry, no rash Neuro:  Strength and sensation are intact Psych: euthymic mood, full affect   EKG:  EKG is not ordered today.   Recent Labs: 04/05/2016: ALT 16; BUN 17;  Creatinine, Ser 1.12; Potassium 3.7; Sodium 139    Lipid Panel    Component Value Date/Time   CHOL 145 04/05/2016 1505   TRIG 92.0 04/05/2016 1505   HDL 59.40 04/05/2016 1505   CHOLHDL 2 04/05/2016 1505   VLDL 18.4 04/05/2016 1505   LDLCALC 67 04/05/2016 1505     Wt Readings from Last 3 Encounters:  05/18/16 143 lb (64.9 kg)  05/04/16 147 lb (66.7 kg)  04/05/16 145 lb 9.6 oz (66 kg)     Other studies Reviewed: Additional studies/ records that were reviewed today include: Office  notes and testing.  ASSESSMENT AND PLAN:  1.  CAD: She is having no ongoing ischemic symptoms. She is compliant with her medications which include aspirin, statin, Plavix, Cozaar, metoprolol and spironolactone. Continue current therapy.  2. Hypertension: Her blood pressure and heart rate are under good control. She requires multidrug therapy but is doing well with that. No medication changes.  3. PAD: She is to continue aspirin and Plavix; cardiac risk factor control is emphasized.  4. Tobacco use: Cessation is again encouraged.  5. Hyperlipidemia: She was changed from Mevacor to Lipitor in July. She states that Dr. Chauncy Passy will be checking her cholesterol soon, she is encouraged to get that done.   Current medicines are reviewed at length with the patient today.  The patient does not have concerns regarding medicines.  The following changes have been made:  no change  Labs/ tests ordered today include:  No orders of the defined types were placed in this encounter.    Disposition:   FU with Dr. Percival Spanish  Signed, Rosaria Ferries, PA-C  05/18/2016 4:16 PM    [facility] Phone: [phone]; Fax: [phone]  This note was written with the assistance of speech recognition software. Please excuse any transcriptional errors.

## 2016-05-18 NOTE — Patient Instructions (Signed)
Medications:  Your physician recommends that you continue on your current medications as directed. Please refer to the Current Medication list given to you today.   Follow-Up:  Please keep your upcoming appointment with Dr. Gwenlyn Found on 07/04/16 at 10:45 am. Please arrive 15 minutes early for this appointment.  If you need a refill on your cardiac medications before your next appointment, please call your pharmacy.

## 2016-05-28 ENCOUNTER — Other Ambulatory Visit: Payer: Self-pay | Admitting: Cardiovascular Disease

## 2016-05-28 DIAGNOSIS — I739 Peripheral vascular disease, unspecified: Secondary | ICD-10-CM

## 2016-05-31 ENCOUNTER — Other Ambulatory Visit: Payer: Self-pay

## 2016-05-31 MED ORDER — GLUCOSE BLOOD VI STRP: ORAL_STRIP | 12 refills | Status: DC

## 2016-06-26 ENCOUNTER — Telehealth: Payer: Self-pay

## 2016-06-26 NOTE — Telephone Encounter (Signed)
Rx for glucose test strips have been sent tp [facility]

## 2016-06-27 ENCOUNTER — Ambulatory Visit (HOSPITAL_COMMUNITY)
Admission: RE | Admit: 2016-06-27 | Discharge: 2016-06-27 | Disposition: A | Discharge status: Home / Self Care | Payer: Medicare Other | Source: Ambulatory Visit | Attending: Cardiology | Admitting: Cardiology

## 2016-06-27 DIAGNOSIS — E785 Hyperlipidemia, unspecified: Secondary | ICD-10-CM | POA: Insufficient documentation

## 2016-06-27 DIAGNOSIS — I10 Essential (primary) hypertension: Secondary | ICD-10-CM | POA: Insufficient documentation

## 2016-06-27 DIAGNOSIS — Z720 Tobacco use: Secondary | ICD-10-CM | POA: Insufficient documentation

## 2016-06-27 DIAGNOSIS — I700 Atherosclerosis of aorta: Secondary | ICD-10-CM | POA: Diagnosis not present

## 2016-06-27 DIAGNOSIS — E1151 Type 2 diabetes mellitus with diabetic peripheral angiopathy without gangrene: Secondary | ICD-10-CM | POA: Diagnosis not present

## 2016-06-27 DIAGNOSIS — I739 Peripheral vascular disease, unspecified: Secondary | ICD-10-CM | POA: Diagnosis not present

## 2016-06-27 DIAGNOSIS — I2510 Atherosclerotic heart disease of native coronary artery without angina pectoris: Secondary | ICD-10-CM | POA: Diagnosis not present

## 2016-06-27 DIAGNOSIS — I708 Atherosclerosis of other arteries: Secondary | ICD-10-CM | POA: Insufficient documentation

## 2016-06-27 DIAGNOSIS — R938 Abnormal findings on diagnostic imaging of other specified body structures: Secondary | ICD-10-CM | POA: Diagnosis not present

## 2016-06-28 ENCOUNTER — Telehealth: Payer: Self-pay | Admitting: *Deleted

## 2016-06-28 DIAGNOSIS — I739 Peripheral vascular disease, unspecified: Secondary | ICD-10-CM

## 2016-06-28 NOTE — Telephone Encounter (Signed)
-----   Message from Lorretta Harp, MD sent at 06/28/2016  3:00 PM EDT ----- No change from prior study. Repeat in 12 months.

## 2016-07-04 ENCOUNTER — Encounter: Payer: Self-pay | Admitting: Cardiovascular Disease

## 2016-07-04 ENCOUNTER — Ambulatory Visit (INDEPENDENT_AMBULATORY_CARE_PROVIDER_SITE_OTHER): Payer: Medicare Other | Admitting: Cardiovascular Disease

## 2016-07-04 DIAGNOSIS — I779 Disorder of arteries and arterioles, unspecified: Secondary | ICD-10-CM | POA: Diagnosis not present

## 2016-07-04 DIAGNOSIS — I6523 Occlusion and stenosis of bilateral carotid arteries: Secondary | ICD-10-CM | POA: Diagnosis not present

## 2016-07-04 DIAGNOSIS — I739 Peripheral vascular disease, unspecified: Secondary | ICD-10-CM

## 2016-07-04 DIAGNOSIS — I701 Atherosclerosis of renal artery: Secondary | ICD-10-CM | POA: Diagnosis not present

## 2016-07-04 NOTE — Patient Instructions (Addendum)
Medication Instructions:  Your physician recommends that you continue on your current medications as directed. Please refer to the Current Medication list given to you today.  Testing/Procedures: Your physician has requested that you have a aorta and iliac duplex in October 2018. During this test, an ultrasound is used to evaluate blood flow to the aorta and iliac arteries. Allow one hour for this exam. Do not eat after midnight the day before and avoid carbonated beverages.   Your physician has requested that you have an ankle brachial index (ABI) in October 2018. During this test an ultrasound and blood pressure cuff are used to evaluate the arteries that supply the arms and legs with blood. Allow thirty minutes for this exam. There are no restrictions or special instructions.  Your physician has requested that you have a carotid duplex in April 2018. This test is an ultrasound of the carotid arteries in your neck. It looks at blood flow through these arteries that supply the brain with blood. Allow one hour for this exam. There are no restrictions or special instructions.   Follow-Up: Your physician wants you to follow-up in: 12 months with Dr. Gwenlyn Found. You will receive a reminder letter in the mail two months in advance. If you don't receive a letter, please call our office to schedule the follow-up appointment.   If you need a refill on your cardiac medications before your next appointment, please call your pharmacy.

## 2016-07-04 NOTE — Assessment & Plan Note (Addendum)
History of carotid artery disease by duplex ultrasound 01/03/16 with moderate right and moderately severe left ICA stenosis. She is neurologically symptomatic. We'll continue to follow her by duplex ultrasound on an annual basis.

## 2016-07-04 NOTE — Assessment & Plan Note (Signed)
History of right renal artery stenting remotely with angiographic documentation of an occluded stent.

## 2016-07-04 NOTE — Progress Notes (Signed)
07/04/2016 Christine Meyer   [birth year]  [id number]  Primary Physician Ann Held, DO Primary Cardiologist: Lorretta Harp MD Christine Meyer  HPI:  Christine Meyer is a 65 year old thin appearing single African-American female mother of 4 children . She was referred by Dr. Percival Spanish , her cardiologist,  for peripheral vascular evaluation. I last saw her in the office 06/28/15.She has a history of myocardial infarction back in 1999 undergoing stenting of her RCA by Dr. Melvern Banker.her chronic risk factors are notable for continued tobacco abuse, treated hypertension and hyperlipidemia. She is complained of increasing dyspnea on exertion over the last 6 months as well as right calf claudication. Dr. Percival Spanish saw her and ordered a stress test. 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 She has had right renal artery stenting in the past as well as right external iliac artery stenting as well. 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 I stented both of these areas. She did have an occluded left SFA with high-grade segmental diffuse right SFA stenosis. Her Dopplers and symptoms improved. Since I saw her a year ago she's remained currently stable. She continues to deny claudication. She is trying to stop smoking and currently smokes 3 cigarettes a day.   Current Outpatient Prescriptions  Medication Sig Dispense Refill  . ACCU-CHEK SOFTCLIX LANCETS lancets Check blood sugar once daily. Dx:E11.9 100 each 5  . aspirin 81 MG chewable tablet Chew 1 tablet (81 mg total) by mouth daily.    Marland Kitchen atorvastatin (LIPITOR) 40 MG tablet Take 1 tablet (40 mg total) by mouth daily. 90 tablet 3  . Blood Glucose Monitoring Suppl (ACCU-CHEK AVIVA PLUS) W/DEVICE KIT Check Blood sugar daily. Dx E11.9 1 kit  0  . budesonide-formoterol (SYMBICORT) 160-4.5 MCG/ACT inhaler Inhale 2 puffs into the lungs 2 (two) times daily. 1 Inhaler 6  . Calcium Carbonate (CALTRATE 600 PO) Take 1 tablet by mouth daily.     . cholecalciferol (VITAMIN D) 1000 UNITS tablet Take 1 tablet (1,000 Units total) by mouth daily. 90 tablet 3  . clopidogrel (PLAVIX) 75 MG tablet TAKE 1 TABLET(75 MG) BY MOUTH DAILY WITH BREAKFAST 90 tablet 3  . glucose blood (ONETOUCH VERIO) test strip Check blood sugar once daily. Dx:E11.9 50 each 12  . IRON PO Take 1 tablet by mouth daily.    Marland Kitchen losartan (COZAAR) 100 MG tablet TAKE 1 TABLET(100 MG) BY MOUTH DAILY 90 tablet 1  . metoprolol tartrate (LOPRESSOR) 25 MG tablet Take 1 tablet (25 mg total) by mouth 2 (two) times daily. 180 tablet 1  . Multiple Vitamins-Minerals (A THRU Z SELECT 50+ ADVANCED) TABS TAKE 1 TABLET BY MOUTH DAILY 220 tablet 1  . Pirfenidone (ESBRIET) 267 MG CAPS Take 2 capsules by mouth 3 (three) times daily.     Marland Kitchen spironolactone (ALDACTONE) 25 MG tablet Take 1 tablet (25 mg total) by mouth daily. 90 tablet 3   No current facility-administered medications for this visit.     No Known Allergies  Social History   Social History  . Marital status: Single    Spouse name: N/A  . Number of children: 4  . Years of education: N/A   Occupational History  . Not on file.   Social History Main Topics  . Smoking status: Current Every Day Smoker  Packs/day: 0.25    Years: 55.00    Types: Cigarettes  . Smokeless tobacco: Never Used  . Alcohol use No  . Drug use: No  . Sexual activity: Yes   Other Topics Concern  . Not on file   Social History Narrative   Lives at home with grandson.      Review of Systems: General: negative for chills, fever, night sweats or weight changes.  Cardiovascular: negative for chest pain, dyspnea on exertion, edema, orthopnea, palpitations, paroxysmal nocturnal dyspnea or shortness of breath Dermatological: negative for  rash Respiratory: negative for cough or wheezing Urologic: negative for hematuria Abdominal: negative for nausea, vomiting, diarrhea, bright red blood per rectum, melena, or hematemesis Neurologic: negative for visual changes, syncope, or dizziness All other systems reviewed and are otherwise negative except as noted above.    Blood pressure (!) 152/54, pulse 74, height 5' 3"  (1.6 m), weight 146 lb (66.2 kg).  General appearance: alert and no distress Neck: no adenopathy, no JVD, supple, symmetrical, trachea midline, thyroid not enlarged, symmetric, no tenderness/mass/nodules and Soft bilateral carotid bruits Lungs: clear to auscultation bilaterally Heart: regular rate and rhythm, S1, S2 normal, no murmur, click, rub or gallop Extremities: extremities normal, atraumatic, no cyanosis or edema  EKG not performed today.  ASSESSMENT AND PLAN:   Peripheral arterial disease History of peripheral arterial disease status post remote right external iliac artery stenting as well as right renal artery stenting in the past. Because of claudication and abnormal Dopplers [place] every angiography by myself 11/22/14 revealing a 70% "in-stent restenosis" within the right iliac artery stent as well as an ostial common iliac artery stenosis on the right. I stented both areas. Claudication improved. I did demonstrate an occluded left SFA and high-grade diffuse segmental right SFA disease stenosis. She had 3 vessel runoff bilaterally. Her most recent Dopplers revealed her stent in the right iliac to be widely patent. She denies claudication. We will continue to follow her Dopplers on annual basis.  Renal artery stenosis, native, bilateral History of right renal artery stenting remotely with angiographic documentation of an occluded stent.  Bilateral carotid artery disease (HCC) History of carotid artery disease by duplex ultrasound 01/03/16 with moderate right and moderately severe left ICA stenosis. She is  neurologically symptomatic. We'll continue to follow her by duplex ultrasound on an annual basis.      Lorretta Harp MD FACP,FACC,FAHA, [facility] 07/04/2016 11:32 AM

## 2016-07-04 NOTE — Assessment & Plan Note (Signed)
History of peripheral arterial disease status post remote right external iliac artery stenting as well as right renal artery stenting in the past. Because of claudication and abnormal Dopplers Christine Meyer every angiography by myself 11/22/14 revealing a 70% "in-stent restenosis" within the right iliac artery stent as well as an ostial common iliac artery stenosis on the right. I stented both areas. Claudication improved. I did demonstrate an occluded left SFA and high-grade diffuse segmental right SFA disease stenosis. She had 3 vessel runoff bilaterally. Her most recent Dopplers revealed her stent in the right iliac to be widely patent. She denies claudication. We will continue to follow her Dopplers on annual basis.

## 2016-07-09 ENCOUNTER — Other Ambulatory Visit: Payer: Self-pay | Admitting: Pulmonary Disease

## 2016-07-10 ENCOUNTER — Other Ambulatory Visit: Payer: Self-pay | Admitting: Family Medicine

## 2016-07-24 ENCOUNTER — Other Ambulatory Visit: Payer: Self-pay

## 2016-07-24 MED ORDER — GLUCOSE BLOOD VI STRP: ORAL_STRIP | 12 refills | Status: DC

## 2016-08-08 ENCOUNTER — Telehealth: Payer: Self-pay

## 2016-08-08 NOTE — Telephone Encounter (Signed)
Received request/order from [facility] on 08/08/2016 for Glucose Test Strips.  Form forwarded to Provider for signature

## 2016-08-09 NOTE — Telephone Encounter (Signed)
Rx for glucose test strips entered on 07/24/16 #50 12 refills.

## 2016-08-24 ENCOUNTER — Telehealth: Payer: Self-pay | Admitting: Pulmonary Disease

## 2016-08-24 NOTE — Telephone Encounter (Signed)
PA request for 2018 rx of Esbriet received.   Form has been filled out and faxed to [facility]- forms are in blue accordion file in Triage.    Will hold message in my inbasket to ensure follow-up

## 2016-08-29 NOTE — Telephone Encounter (Signed)
PA has been approved for the Esbriet 267 mg  Through 09/10/2018.

## 2016-09-09 ENCOUNTER — Other Ambulatory Visit: Payer: Self-pay | Admitting: Family Medicine

## 2016-10-06 ENCOUNTER — Other Ambulatory Visit: Payer: Self-pay | Admitting: Family Medicine

## 2016-10-09 ENCOUNTER — Ambulatory Visit: Payer: Medicare Other | Admitting: Family Medicine

## 2016-10-11 NOTE — Progress Notes (Signed)
Subjective:   Christine Meyer is a 66 y.o. female who presents for Medicare Annual (Subsequent) preventive examination.  Review of Systems:  No ROS.  Medicare Wellness Visit.  Cardiac Risk Factors include: dyslipidemia;sedentary lifestyle;hypertension   Sleep patterns: no sleep issues and feels rested on waking.   Home Safety/Smoke Alarms:Feels safe in home. Smoke alarms in place.    Living environment; residence and Firearm Safety: Lives alone. 1-story house/ trailer, no firearms. Seat Belt Safety/Bike Helmet: Wears seat belt.   Counseling:   Eye Exam- Dr. Bing Plume yearly. Upcoming appt next week. Dental- [facility] on [address]N  Female:   Pap- N/A, hysterectomy    Mammo-Last 09/24/14: BI-RADS CATEGORY 1: Negative. Dexa scan- Last 09/22/14: osteopenia.        CCS- last 02/14/11. No report on file. Pt thinks was done by [facility].     Objective:     Vitals: BP (!) 136/54 (BP Location: Left Arm, Patient Position: Sitting, Cuff Size: Normal)   Pulse 67   Temp 97.8 F (36.6 C) (Oral)   Resp 16   Ht 5' 3" (1.6 m)   Wt 145 lb 12.8 oz (66.1 kg)   SpO2 98% Comment: RA  BMI 25.83 kg/m   Body mass index is 25.83 kg/m.   Tobacco History  Smoking Status  . Current Every Day Smoker  . Packs/day: 0.25  . Years: 55.00  . Types: Cigarettes  Smokeless Tobacco  . Never Used     Ready to quit: Yes Counseling given: Not Answered   Past Medical History:  Diagnosis Date  . Anemia    NOS  . CAD (coronary artery disease)    Stent RCA 1999  . COPD (chronic obstructive pulmonary disease) ([place])   . Diabetes mellitus, type 2 (HCC)    Diet controlled  . Hyperlipidemia   . Hypertension   . OSA on CPAP   . PVD (peripheral vascular disease) (HCC)    Carotid stenosis, renal artery stenosis  . Sleep apnea    dx'd but doesn't use mask   . Uterine cancer ([place]) ?8416'S   Past Surgical History:  Procedure Laterality Date  . ABDOMINAL ANGIOGRAM  11/22/2014   Procedure:  ABDOMINAL ANGIOGRAM;  Surgeon: Lorretta Harp, MD;  Location: [facility] CATH LAB;  Service: Cardiovascular;;  . COLONOSCOPY  2000   negative  . CORONARY ANGIOPLASTY WITH STENT PLACEMENT  1999   RCA; Dr. Melvern Banker G4 P4  . ILIAC ARTERY STENT Right 11/22/2014  . LOWER EXTREMITY ANGIOGRAM N/A 11/22/2014   Procedure: LOWER EXTREMITY ANGIOGRAM;  Surgeon: Lorretta Harp, MD; L-SFA 100%, 3v runoff, R-pCIA 95>>0% w/ 8 mm x 18 mm long Balloon expandable stent; R-dCIA 67>>0% w/ 8 mm x 4 cm long Cordis Smart Nitinol self expanding stent   . RENAL ARTERY STENT Right May 2007; 08/2006   ; restenosis  . TOTAL ABDOMINAL HYSTERECTOMY  ?1980's    for uterine CA   Family History  Problem Relation Age of Onset  . Heart failure Mother     after hip fx.   . Hypertension Mother   . Diabetes Mother   . Deep vein thrombosis Mother   . Hypertension Father   . Diabetes Sister     insulin dependent  . Hypertension Brother   . Emphysema Brother   . Diabetes Brother     insulin dependent  . Diabetes Maternal Aunt     non-insulent dependent  . Heart attack Paternal Aunt   . Diabetes Maternal  Grandmother     non-insulin dependent   History  Sexual Activity  . Sexual activity: Yes    Outpatient Encounter Prescriptions as of 10/15/2016  Medication Sig  . ACCU-CHEK SOFTCLIX LANCETS lancets Check blood sugar once daily. Dx:E11.9  . aspirin 81 MG chewable tablet Chew 1 tablet (81 mg total) by mouth daily.  Marland Kitchen atorvastatin (LIPITOR) 40 MG tablet Take 1 tablet (40 mg total) by mouth daily.  . Blood Glucose Monitoring Suppl (ACCU-CHEK AVIVA PLUS) W/DEVICE KIT Check Blood sugar daily. Dx E11.9  . budesonide-formoterol (SYMBICORT) 160-4.5 MCG/ACT inhaler Inhale 2 puffs into the lungs 2 (two) times daily.  . Calcium Carbonate (CALTRATE 600 PO) Take 1 tablet by mouth daily.   . cholecalciferol (VITAMIN D) 1000 UNITS tablet Take 1 tablet (1,000 Units total) by mouth daily.  . clopidogrel (PLAVIX) 75 MG tablet TAKE 1  TABLET(75 MG) BY MOUTH DAILY WITH BREAKFAST  . ESBRIET 267 MG CAPS TAKE 2 CAPSULES BY MOUTH THREE TIMES A DAY  . glucose blood (ONETOUCH VERIO) test strip Check blood sugar once daily. Dx:E11.9  . IRON PO Take 1 tablet by mouth daily.  Marland Kitchen losartan (COZAAR) 100 MG tablet TAKE 1 TABLET(100 MG) BY MOUTH DAILY  . losartan (COZAAR) 100 MG tablet TAKE 1 TABLET(100 MG) BY MOUTH DAILY  . metoprolol tartrate (LOPRESSOR) 25 MG tablet Take 1 tablet (25 mg total) by mouth 2 (two) times daily.  . metoprolol tartrate (LOPRESSOR) 25 MG tablet TAKE 1 TABLET BY MOUTH TWICE DAILY  . Multiple Vitamins-Minerals (A THRU Z SELECT 50+ ADVANCED) TABS TAKE 1 TABLET BY MOUTH DAILY  . meloxicam (MOBIC) 7.5 MG tablet Take 1 tablet (7.5 mg total) by mouth daily.  Marland Kitchen spironolactone (ALDACTONE) 25 MG tablet Take 1 tablet (25 mg total) by mouth daily.   No facility-administered encounter medications on file as of 10/15/2016.     Activities of Daily Living In your present state of health, do you have any difficulty performing the following activities: 10/15/2016  Hearing? N  Vision? N  Difficulty concentrating or making decisions? N  Walking or climbing stairs? N  Dressing or bathing? N  Doing errands, shopping? N  Preparing Food and eating ? N  Using the Toilet? N  In the past six months, have you accidently leaked urine? N  Do you have problems with loss of bowel control? N  Managing your Medications? N  Managing your Finances? N  Housekeeping or managing your Housekeeping? N  Some recent data might be hidden    Patient Care Team: Ann Held, DO as PCP - General (Family Medicine) Calvert Cantor, MD as Consulting Physician (Ophthalmology) Juanito Doom, MD as Consulting Physician (Pulmonary Disease) Lorretta Harp, MD as Consulting Physician (Cardiology)    Assessment:    Physical assessment deferred to PCP.  Exercise Activities and Dietary recommendations Current Exercise Habits: The patient  does not participate in regular exercise at present, Exercise limited by: cardiac condition(s) Diet (meal preparation, eat out, water intake, caffeinated beverages, dairy products, fruits and vegetables): on average, 2 meals per day. Drinks water, coffee, and regular soda throughout the day. Makes own meals.  Breakfast: Toast and coffee Lunch: skips  Dinner: Meals vary; sandwich or chicken w/ rice     Goals    . Quit smoking / using tobacco      Fall Risk Fall Risk  10/15/2016 09/08/2015 09/06/2014  Falls in the past year? No No No   Depression Screen PHQ 2/9 Scores 10/15/2016  09/08/2015 09/06/2014  PHQ - 2 Score 0 0 0     Cognitive Function MMSE - Mini Mental State Exam 10/15/2016  Orientation to time 5  Orientation to Place 5  Registration 3  Attention/ Calculation 4  Recall 3  Language- name 2 objects 2  Language- repeat 1  Language- follow 3 step command 3  Language- read & follow direction 1  Write a sentence 1  Copy design 0  Total score 28        Immunization History  Administered Date(s) Administered  . Pneumococcal Conjugate-13 09/06/2014  . Pneumococcal Polysaccharide-23 09/08/2015  . Td 11/09/2003   Screening Tests Health Maintenance  Topic Date Due  . ZOSTAVAX  03/20/2009  . TETANUS/TDAP  11/08/2013  . OPHTHALMOLOGY EXAM  09/27/2016  . HEMOGLOBIN A1C  10/06/2016  . INFLUENZA VACCINE  12/08/2016 (Originally 04/10/2016)  . FOOT EXAM  04/05/2017  . MAMMOGRAM  09/25/2017  . COLONOSCOPY  02/13/2021  . DEXA SCAN  Completed  . Hepatitis C Screening  Completed  . PNA vac Low Risk Adult  Completed      Plan:   Follow-up w/ PCP as scheduled.  DEXA ordered.   During the course of the visit the patient was educated and counseled about the following appropriate screening and preventive services:   Vaccines to include Pneumoccal, Influenza, Hepatitis B, Td, Zostavax, HCV  Cardiovascular Disease  Colorectal cancer screening  Bone density  screening  Diabetes screening  Glaucoma screening  Mammography/PAP  Nutrition counseling   Smoking cessation counseling  Patient Instructions (the written plan) was given to the patient.   Dorrene German, RN  10/15/2016

## 2016-10-11 NOTE — Progress Notes (Signed)
Pre visit review using our clinic review tool, if applicable. No additional management support is needed unless otherwise documented below in the visit note. 

## 2016-10-15 ENCOUNTER — Ambulatory Visit: Payer: Medicare Other | Admitting: *Deleted

## 2016-10-15 ENCOUNTER — Ambulatory Visit (INDEPENDENT_AMBULATORY_CARE_PROVIDER_SITE_OTHER): Payer: Medicare HMO | Admitting: Family Medicine

## 2016-10-15 ENCOUNTER — Other Ambulatory Visit: Payer: Self-pay | Admitting: Family Medicine

## 2016-10-15 ENCOUNTER — Encounter: Payer: Self-pay | Admitting: Family Medicine

## 2016-10-15 VITALS — BP 136/54 | HR 67 | Temp 97.8°F | Resp 16 | Ht 63.0 in | Wt 145.8 lb

## 2016-10-15 DIAGNOSIS — L600 Ingrowing nail: Secondary | ICD-10-CM

## 2016-10-15 DIAGNOSIS — E1151 Type 2 diabetes mellitus with diabetic peripheral angiopathy without gangrene: Secondary | ICD-10-CM | POA: Diagnosis not present

## 2016-10-15 DIAGNOSIS — Z0000 Encounter for general adult medical examination without abnormal findings: Secondary | ICD-10-CM

## 2016-10-15 DIAGNOSIS — Z780 Asymptomatic menopausal state: Secondary | ICD-10-CM

## 2016-10-15 DIAGNOSIS — I10 Essential (primary) hypertension: Secondary | ICD-10-CM

## 2016-10-15 DIAGNOSIS — E118 Type 2 diabetes mellitus with unspecified complications: Secondary | ICD-10-CM

## 2016-10-15 DIAGNOSIS — E785 Hyperlipidemia, unspecified: Secondary | ICD-10-CM | POA: Diagnosis not present

## 2016-10-15 DIAGNOSIS — Z1231 Encounter for screening mammogram for malignant neoplasm of breast: Secondary | ICD-10-CM

## 2016-10-15 LAB — HEMOGLOBIN A1C: HEMOGLOBIN A1C: 6.1 % (ref 4.6–6.5)

## 2016-10-15 LAB — LIPID PANEL
Cholesterol: 155 mg/dL (ref 0–200)
HDL: 59.2 mg/dL (ref >39.00)
LDL Cholesterol: 82 mg/dL (ref 0–99)
NONHDL: 95.79
Total CHOL/HDL Ratio: 3
Triglycerides: 71 mg/dL (ref 0.0–149.0)
VLDL: 14.2 mg/dL (ref 0.0–40.0)

## 2016-10-15 LAB — COMPREHENSIVE METABOLIC PANEL
ALK PHOS: 69 U/L (ref 39–117)
ALT: 21 U/L (ref 0–35)
AST: 26 U/L (ref 0–37)
Albumin: 4.1 g/dL (ref 3.5–5.2)
BILIRUBIN TOTAL: 0.3 mg/dL (ref 0.2–1.2)
BUN: 12 mg/dL (ref 6–23)
CO2: 25 mEq/L (ref 19–32)
Calcium: 9.5 mg/dL (ref 8.4–10.5)
Chloride: 109 mEq/L (ref 96–112)
Creatinine, Ser: 1.04 mg/dL (ref 0.40–1.20)
GFR: 67.86 mL/min (ref >60.00)
GLUCOSE: 99 mg/dL (ref 70–99)
Potassium: 4.1 mEq/L (ref 3.5–5.1)
SODIUM: 140 meq/L (ref 135–145)
TOTAL PROTEIN: 7.3 g/dL (ref 6.0–8.3)

## 2016-10-15 MED ORDER — METOPROLOL TARTRATE 25 MG PO TABS: 25.0000 mg | ORAL_TABLET | Freq: Two times a day (BID) | ORAL | 1 refills | Status: DC

## 2016-10-15 MED ORDER — LOSARTAN POTASSIUM 100 MG PO TABS: ORAL_TABLET | ORAL | 1 refills | Status: DC

## 2016-10-15 MED ORDER — SPIRONOLACTONE 25 MG PO TABS: 25.0000 mg | ORAL_TABLET | Freq: Every day | ORAL | 3 refills | Status: DC

## 2016-10-15 MED ORDER — ATORVASTATIN CALCIUM 40 MG PO TABS: 40.0000 mg | ORAL_TABLET | Freq: Every day | ORAL | 3 refills | Status: DC

## 2016-10-15 MED ORDER — MELOXICAM 7.5 MG PO TABS: 7.5000 mg | ORAL_TABLET | Freq: Every day | ORAL | 3 refills | Status: DC

## 2016-10-15 NOTE — Progress Notes (Signed)
Pre visit review using our clinic review tool, if applicable. No additional management support is needed unless otherwise documented below in the visit note. 

## 2016-10-15 NOTE — Assessment & Plan Note (Signed)
con't meds Check labs stable

## 2016-10-15 NOTE — Patient Instructions (Signed)

## 2016-10-15 NOTE — Progress Notes (Signed)
Patient ID: Christine Meyer, female   DOB: 12/16/1950, 66 y.o.   MRN: [id number]    Subjective:    Patient ID: Christine Meyer, female    DOB: [birth year], 66 y.o.   MRN: [id number]  Chief Complaint  Patient presents with  . Diabetes  . Arthritis    Left shoulder. x2weeks. Denies injury.    Diabetes  She presents for her follow-up diabetic visit. She has type 2 diabetes mellitus. Her disease course has been stable. Pertinent negatives for hypoglycemia include no dizziness, headaches or nervousness/anxiousness. Pertinent negatives for diabetes include no blurred vision and no chest pain.  Arthritis  Presents for initial visit. The condition has lasted for 2 weeks. She complains of pain and stiffness. Affected locations include the left shoulder. Pertinent negatives include no dysuria, fever or rash. Past treatments include exercise and NSAIDs. The treatment provided no relief. Exacerbated by: Suspects that it may have been brought on by the climate.  Hypertension  This is a chronic problem. The current episode started more than 1 year ago. The problem has been waxing and waning since onset. The problem is controlled. Pertinent negatives include no blurred vision, chest pain, headaches, malaise/fatigue, palpitations or shortness of breath. The current treatment provides mild improvement.   Hyperlipidemia--- check labs Patient is in today for a six month follow up on Diabetes. Also following up on hypertension. Patient also has a complaint of possible arthritis in her left shoulder. Has been experiencing pain for the last 2 weeks. No additional concerns noted.  I acted as a Education administrator for [employer], DO. Raiford Noble, [place]   Past Medical History:  Diagnosis Date  . Anemia    NOS  . CAD (coronary artery disease)    Stent RCA 1999  . COPD (chronic obstructive pulmonary disease) ([place])   . Diabetes mellitus, type 2 (HCC)    Diet controlled  . Hyperlipidemia   . Hypertension   . OSA on CPAP    . PVD (peripheral vascular disease) (HCC)    Carotid stenosis, renal artery stenosis  . Sleep apnea    dx'd but doesn't use mask   . Uterine cancer ([place]) ?0626'R    Past Surgical History:  Procedure Laterality Date  . ABDOMINAL ANGIOGRAM  11/22/2014   Procedure: ABDOMINAL ANGIOGRAM;  Surgeon: Lorretta Harp, MD;  Location[facility] CATH LAB;  Service: Cardiovascular;;  . COLONOSCOPY  2000   negative  . CORONARY ANGIOPLASTY WITH STENT PLACEMENT  1999   RCA; Dr. Melvern Banker G4 P4  . ILIAC ARTERY STENT Right 11/22/2014  . LOWER EXTREMITY ANGIOGRAM N/A 11/22/2014   Procedure: LOWER EXTREMITY ANGIOGRAM;  Surgeon: Lorretta Harp, MD; L-SFA 100%, 3v runoff, R-pCIA 95>>0% w/ 8 mm x 18 mm long Balloon expandable stent; R-dCIA 67>>0% w/ 8 mm x 4 cm long Cordis Smart Nitinol self expanding stent   . RENAL ARTERY STENT Right May 2007; 08/2006   ; restenosis  . TOTAL ABDOMINAL HYSTERECTOMY  ?1980's    for uterine CA    Family History  Problem Relation Age of Onset  . Heart failure Mother     after hip fx.   . Hypertension Mother   . Diabetes Mother   . Deep vein thrombosis Mother   . Hypertension Father   . Diabetes Sister     insulin dependent  . Hypertension Brother   . Emphysema Brother   . Diabetes Brother     insulin dependent  . Diabetes Maternal Aunt     non-insulent  dependent  . Heart attack Paternal Aunt   . Diabetes Maternal Grandmother     non-insulin dependent    Social History   Social History  . Marital status: Single    Spouse name: N/A  . Number of children: 4  . Years of education: N/A   Occupational History  . Not on file.   Social History Main Topics  . Smoking status: Current Every Day Smoker    Packs/day: 0.25    Years: 55.00    Types: Cigarettes  . Smokeless tobacco: Never Used  . Alcohol use No  . Drug use: No  . Sexual activity: Yes   Other Topics Concern  . Not on file   Social History Narrative   Lives at home with grandson.      Outpatient Medications Prior to Visit  Medication Sig Dispense Refill  . ACCU-CHEK SOFTCLIX LANCETS lancets Check blood sugar once daily. Dx:E11.9 100 each 5  . aspirin 81 MG chewable tablet Chew 1 tablet (81 mg total) by mouth daily.    . Blood Glucose Monitoring Suppl (ACCU-CHEK AVIVA PLUS) W/DEVICE KIT Check Blood sugar daily. Dx E11.9 1 kit 0  . budesonide-formoterol (SYMBICORT) 160-4.5 MCG/ACT inhaler Inhale 2 puffs into the lungs 2 (two) times daily. 1 Inhaler 6  . Calcium Carbonate (CALTRATE 600 PO) Take 1 tablet by mouth daily.     . cholecalciferol (VITAMIN D) 1000 UNITS tablet Take 1 tablet (1,000 Units total) by mouth daily. 90 tablet 3  . clopidogrel (PLAVIX) 75 MG tablet TAKE 1 TABLET(75 MG) BY MOUTH DAILY WITH BREAKFAST 90 tablet 3  . ESBRIET 267 MG CAPS TAKE 2 CAPSULES BY MOUTH THREE TIMES A DAY 180 capsule 11  . glucose blood (ONETOUCH VERIO) test strip Check blood sugar once daily. Dx:E11.9 50 each 12  . IRON PO Take 1 tablet by mouth daily.    Marland Kitchen losartan (COZAAR) 100 MG tablet TAKE 1 TABLET(100 MG) BY MOUTH DAILY 90 tablet 0  . metoprolol tartrate (LOPRESSOR) 25 MG tablet TAKE 1 TABLET BY MOUTH TWICE DAILY 180 tablet 0  . Multiple Vitamins-Minerals (A THRU Z SELECT 50+ ADVANCED) TABS TAKE 1 TABLET BY MOUTH DAILY 220 tablet 1  . atorvastatin (LIPITOR) 40 MG tablet Take 1 tablet (40 mg total) by mouth daily. 90 tablet 3  . losartan (COZAAR) 100 MG tablet TAKE 1 TABLET(100 MG) BY MOUTH DAILY 90 tablet 1  . metoprolol tartrate (LOPRESSOR) 25 MG tablet Take 1 tablet (25 mg total) by mouth 2 (two) times daily. 180 tablet 1  . spironolactone (ALDACTONE) 25 MG tablet Take 1 tablet (25 mg total) by mouth daily. 90 tablet 3   No facility-administered medications prior to visit.     No Known Allergies  Review of Systems  Constitutional: Negative for fever and malaise/fatigue.  HENT: Negative for congestion.   Eyes: Negative for blurred vision.  Respiratory: Negative for  shortness of breath.   Cardiovascular: Negative for chest pain, palpitations and leg swelling.  Gastrointestinal: Negative for abdominal pain, blood in stool and nausea.  Genitourinary: Negative for dysuria and frequency.  Musculoskeletal: Positive for arthritis, joint pain and stiffness. Negative for falls.  Skin: Negative for rash.  Neurological: Negative for dizziness, loss of consciousness and headaches.  Endo/Heme/Allergies: Negative for environmental allergies.  Psychiatric/Behavioral: Negative for depression. The patient is not nervous/anxious.        Objective:    Physical Exam  Constitutional: She is oriented to person, place, and time. She appears well-developed and  well-nourished.  HENT:  Head: Normocephalic and atraumatic.  Eyes: Conjunctivae and EOM are normal.  Neck: Normal range of motion. Neck supple. No JVD present. Carotid bruit is not present. No thyromegaly present.  Cardiovascular: Normal rate, regular rhythm and normal heart sounds.   No murmur heard. Pulmonary/Chest: Effort normal and breath sounds normal. No respiratory distress. She has no wheezes. She has no rales. She exhibits no tenderness.  Musculoskeletal: She exhibits tenderness. She exhibits no edema.       Left shoulder: She exhibits decreased range of motion, tenderness and pain. She exhibits no deformity, no laceration and normal strength.       Arms: Neurological: She is alert and oriented to person, place, and time.  Psychiatric: She has a normal mood and affect.  Nursing note and vitals reviewed.   BP (!) 136/54 (BP Location: Left Arm, Patient Position: Sitting, Cuff Size: Normal)   Pulse 67   Temp 97.8 F (36.6 C) (Oral)   Resp 16   Ht 5' 3"  (1.6 m)   Wt 145 lb 12.8 oz (66.1 kg)   SpO2 98% Comment: RA  BMI 25.83 kg/m  Wt Readings from Last 3 Encounters:  10/15/16 145 lb 12.8 oz (66.1 kg)  07/04/16 146 lb (66.2 kg)  05/18/16 143 lb (64.9 kg)     Lab Results  Component Value Date    WBC 5.5 11/23/2014   HGB 10.6 (L) 11/23/2014   HCT 32.1 (L) 11/23/2014   PLT 242 11/23/2014   GLUCOSE 99 10/15/2016   CHOL 155 10/15/2016   TRIG 71.0 10/15/2016   HDL 59.20 10/15/2016   LDLCALC 82 10/15/2016   ALT 21 10/15/2016   AST 26 10/15/2016   NA 140 10/15/2016   K 4.1 10/15/2016   CL 109 10/15/2016   CREATININE 1.04 10/15/2016   BUN 12 10/15/2016   CO2 25 10/15/2016   TSH 2.372 11/11/2014   INR 0.95 11/11/2014   HGBA1C 6.1 10/15/2016   MICROALBUR <0.7 03/10/2015    Lab Results  Component Value Date   TSH 2.372 11/11/2014   Lab Results  Component Value Date   WBC 5.5 11/23/2014   HGB 10.6 (L) 11/23/2014   HCT 32.1 (L) 11/23/2014   MCV 100.3 (H) 11/23/2014   PLT 242 11/23/2014   Lab Results  Component Value Date   NA 140 10/15/2016   K 4.1 10/15/2016   CO2 25 10/15/2016   GLUCOSE 99 10/15/2016   BUN 12 10/15/2016   CREATININE 1.04 10/15/2016   BILITOT 0.3 10/15/2016   ALKPHOS 69 10/15/2016   AST 26 10/15/2016   ALT 21 10/15/2016   PROT 7.3 10/15/2016   ALBUMIN 4.1 10/15/2016   CALCIUM 9.5 10/15/2016   ANIONGAP 6 11/23/2014   GFR 67.86 10/15/2016   Lab Results  Component Value Date   CHOL 155 10/15/2016   Lab Results  Component Value Date   HDL 59.20 10/15/2016   Lab Results  Component Value Date   LDLCALC 82 10/15/2016   Lab Results  Component Value Date   TRIG 71.0 10/15/2016   Lab Results  Component Value Date   CHOLHDL 3 10/15/2016   Lab Results  Component Value Date   HGBA1C 6.1 10/15/2016       Assessment & Plan:   Problem List Items Addressed This Visit      Unprioritized   DM (diabetes mellitus) type II controlled peripheral vascular disorder ([place])    Check labs Stable       Relevant  Medications   spironolactone (ALDACTONE) 25 MG tablet   metoprolol tartrate (LOPRESSOR) 25 MG tablet   losartan (COZAAR) 100 MG tablet   atorvastatin (LIPITOR) 40 MG tablet   Dyslipidemia    Check labs con't meds        Relevant Medications   atorvastatin (LIPITOR) 40 MG tablet   Essential hypertension    con't meds Check labs stable      Relevant Medications   spironolactone (ALDACTONE) 25 MG tablet   metoprolol tartrate (LOPRESSOR) 25 MG tablet   losartan (COZAAR) 100 MG tablet   atorvastatin (LIPITOR) 40 MG tablet    Other Visit Diagnoses    Type 2 diabetes mellitus with complication, unspecified long term insulin use status (HCC)    -  Primary   Relevant Medications   losartan (COZAAR) 100 MG tablet   atorvastatin (LIPITOR) 40 MG tablet   Other Relevant Orders   Hemoglobin A1c (Completed)   Encounter for Medicare annual wellness exam       Hyperlipidemia, unspecified hyperlipidemia type       Relevant Medications   spironolactone (ALDACTONE) 25 MG tablet   metoprolol tartrate (LOPRESSOR) 25 MG tablet   losartan (COZAAR) 100 MG tablet   atorvastatin (LIPITOR) 40 MG tablet   Other Relevant Orders   Lipid panel (Completed)   Hypertension, unspecified type       Relevant Medications   spironolactone (ALDACTONE) 25 MG tablet   metoprolol tartrate (LOPRESSOR) 25 MG tablet   losartan (COZAAR) 100 MG tablet   atorvastatin (LIPITOR) 40 MG tablet   Other Relevant Orders   Comprehensive metabolic panel (Completed)   Ingrown toenail       Relevant Orders   Ambulatory referral to Podiatry   Asymptomatic postmenopausal state       Relevant Orders   DG Bone Density   Hyperlipidemia LDL goal <70       Relevant Medications   spironolactone (ALDACTONE) 25 MG tablet   metoprolol tartrate (LOPRESSOR) 25 MG tablet   losartan (COZAAR) 100 MG tablet   atorvastatin (LIPITOR) 40 MG tablet      I am having Ms. Fye start on meloxicam. I am also having her maintain her Calcium Carbonate (CALTRATE 600 PO), IRON PO, aspirin, cholecalciferol, ACCU-CHEK AVIVA PLUS, ACCU-CHEK SOFTCLIX LANCETS, clopidogrel, budesonide-formoterol, A THRU Z SELECT 50+ ADVANCED, ESBRIET, glucose blood, metoprolol tartrate,  losartan, spironolactone, metoprolol tartrate, losartan, and atorvastatin.  Meds ordered this encounter  Medications  . meloxicam (MOBIC) 7.5 MG tablet    Sig: Take 1 tablet (7.5 mg total) by mouth daily.    Dispense:  30 tablet    Refill:  3  . spironolactone (ALDACTONE) 25 MG tablet    Sig: Take 1 tablet (25 mg total) by mouth daily.    Dispense:  90 tablet    Refill:  3  . metoprolol tartrate (LOPRESSOR) 25 MG tablet    Sig: Take 1 tablet (25 mg total) by mouth 2 (two) times daily.    Dispense:  180 tablet    Refill:  1  . losartan (COZAAR) 100 MG tablet    Sig: TAKE 1 TABLET(100 MG) BY MOUTH DAILY    Dispense:  90 tablet    Refill:  1  . atorvastatin (LIPITOR) 40 MG tablet    Sig: Take 1 tablet (40 mg total) by mouth daily.    Dispense:  90 tablet    Refill:  3    CMA served as scribe during this visit. History, Physical and  Plan performed by medical provider. Documentation and orders reviewed and attested to.  Ann Held, DO

## 2016-10-15 NOTE — Assessment & Plan Note (Signed)
Check labs con't meds 

## 2016-10-15 NOTE — Assessment & Plan Note (Signed)
Check labs  Stable  

## 2016-10-16 ENCOUNTER — Telehealth: Payer: Self-pay | Admitting: Family Medicine

## 2016-10-16 NOTE — Telephone Encounter (Signed)
Pt returning call for lab results  

## 2016-10-16 NOTE — Telephone Encounter (Signed)
Called the patient back informed her of the lab results. See result notes as had already informed her daughter Christine Meyer ok per demographics

## 2016-10-22 DIAGNOSIS — H5203 Hypermetropia, bilateral: Secondary | ICD-10-CM | POA: Diagnosis not present

## 2016-10-22 DIAGNOSIS — H52223 Regular astigmatism, bilateral: Secondary | ICD-10-CM | POA: Diagnosis not present

## 2016-10-22 DIAGNOSIS — H524 Presbyopia: Secondary | ICD-10-CM | POA: Diagnosis not present

## 2016-10-22 DIAGNOSIS — H538 Other visual disturbances: Secondary | ICD-10-CM | POA: Diagnosis not present

## 2016-10-22 DIAGNOSIS — H25813 Combined forms of age-related cataract, bilateral: Secondary | ICD-10-CM | POA: Diagnosis not present

## 2016-10-23 ENCOUNTER — Encounter: Payer: Self-pay | Admitting: Podiatry

## 2016-10-23 ENCOUNTER — Ambulatory Visit (INDEPENDENT_AMBULATORY_CARE_PROVIDER_SITE_OTHER): Payer: Medicare HMO | Admitting: Podiatry

## 2016-10-23 DIAGNOSIS — B351 Tinea unguium: Secondary | ICD-10-CM

## 2016-10-23 DIAGNOSIS — L600 Ingrowing nail: Secondary | ICD-10-CM

## 2016-10-23 DIAGNOSIS — E1151 Type 2 diabetes mellitus with diabetic peripheral angiopathy without gangrene: Secondary | ICD-10-CM

## 2016-10-23 DIAGNOSIS — M79674 Pain in right toe(s): Secondary | ICD-10-CM

## 2016-10-23 DIAGNOSIS — M79675 Pain in left toe(s): Secondary | ICD-10-CM

## 2016-10-23 NOTE — Progress Notes (Signed)
Subjective:     Patient ID: Christine Meyer, female   DOB: Dec 09, 1950, 66 y.o.   MRN: [id number]  HPI Christine Meyer presents to the office today for concerns of an ingrown toenail to the right big toe as well as for painful, elongated toenails that she cannot trim herself. She states the nails get irritated in shoes. She does have a history of peripheral arterial disease and she has stents in her leg She's been following up with Dr. Alvester Chou for this. Denies any numbness or tingling. No other complaints.   Review of Systems  All other systems reviewed and are negative.      Objective:   Physical Exam General: AAO x3, NAD  Dermatological: Nails are hypertrophic, dystrophic, brittle, discolored, elongated 10. There is incurvation of bilateral hallux toenails with the right worse than the left. No surrounding redness or drainage. Tenderness nails 1-5 bilaterally. No open lesions or pre-ulcerative lesions are identified today. No signs of infection.   Vascular: Dorsalis Pedis artery and Posterior Tibial artery pedal pulses are decreased. No varicosities and no lower extremity edema present bilateral. There is no pain with calf compression, swelling, warmth, erythema.   Neruologic: Sensation intact with SWMF.   Musculoskeletal: No gross boney pedal deformities bilateral. No pain, crepitus, or limitation noted with foot and ankle range of motion bilateral. Muscular strength 5/5 in all groups tested bilateral.  Gait: Unassisted, Nonantalgic.      Assessment:     66 year old female with symptomatic onychomycosis; ingrown toenail    Plan:     -Treatment options discussed including all alternatives, risks, and complications -Etiology of symptoms were discussed -Nails debrided 10 without complications or bleeding. -discussed partial nail avulsion of her big toe however given arterial disease will hold off on this. There is no signs of infection. -Daily foot inspection -Follow-up in 3 months or  sooner if any problems arise. In the meantime, encouraged to call the office with any questions, concerns, change in symptoms.   Celesta Gentile, DPM

## 2016-10-23 NOTE — Patient Instructions (Signed)

## 2016-10-29 ENCOUNTER — Encounter (HOSPITAL_BASED_OUTPATIENT_CLINIC_OR_DEPARTMENT_OTHER): Payer: Self-pay

## 2016-10-29 ENCOUNTER — Ambulatory Visit (HOSPITAL_BASED_OUTPATIENT_CLINIC_OR_DEPARTMENT_OTHER)
Admission: RE | Admit: 2016-10-29 | Discharge: 2016-10-29 | Disposition: A | Discharge status: Home / Self Care | Payer: Medicare HMO | Source: Ambulatory Visit | Attending: Family Medicine | Admitting: Family Medicine

## 2016-10-29 DIAGNOSIS — Z1231 Encounter for screening mammogram for malignant neoplasm of breast: Secondary | ICD-10-CM | POA: Insufficient documentation

## 2016-10-29 DIAGNOSIS — E119 Type 2 diabetes mellitus without complications: Secondary | ICD-10-CM | POA: Diagnosis not present

## 2016-10-29 DIAGNOSIS — M85851 Other specified disorders of bone density and structure, right thigh: Secondary | ICD-10-CM | POA: Diagnosis not present

## 2016-10-29 DIAGNOSIS — Z780 Asymptomatic menopausal state: Secondary | ICD-10-CM | POA: Diagnosis not present

## 2016-11-09 ENCOUNTER — Other Ambulatory Visit: Payer: Self-pay | Admitting: Family Medicine

## 2016-11-09 MED ORDER — CALCIUM CARBONATE 1250 (500 CA) MG PO TABS: 1.0000 | ORAL_TABLET | Freq: Every day | ORAL | 3 refills | Status: DC

## 2016-11-09 MED ORDER — VITAMIN D 50 MCG (2000 UT) PO CAPS: 1.0000 | ORAL_CAPSULE | Freq: Every day | ORAL | 3 refills | Status: DC

## 2016-11-09 MED ORDER — ALENDRONATE SODIUM 70 MG PO TABS: 70.0000 mg | ORAL_TABLET | ORAL | 3 refills | Status: DC

## 2016-11-12 DIAGNOSIS — H538 Other visual disturbances: Secondary | ICD-10-CM | POA: Diagnosis not present

## 2016-11-12 DIAGNOSIS — H52223 Regular astigmatism, bilateral: Secondary | ICD-10-CM | POA: Diagnosis not present

## 2016-11-12 DIAGNOSIS — H25813 Combined forms of age-related cataract, bilateral: Secondary | ICD-10-CM | POA: Diagnosis not present

## 2016-11-12 DIAGNOSIS — H53482 Generalized contraction of visual field, left eye: Secondary | ICD-10-CM | POA: Diagnosis not present

## 2016-12-08 ENCOUNTER — Other Ambulatory Visit: Payer: Self-pay | Admitting: Family Medicine

## 2016-12-10 ENCOUNTER — Other Ambulatory Visit: Payer: Self-pay

## 2016-12-10 MED ORDER — CLOPIDOGREL BISULFATE 75 MG PO TABS: 75.0000 mg | ORAL_TABLET | Freq: Every day | ORAL | 1 refills | Status: DC

## 2016-12-10 NOTE — Telephone Encounter (Signed)
Rx(s) sent to pharmacy electronically.  

## 2017-01-07 ENCOUNTER — Other Ambulatory Visit: Payer: Self-pay | Admitting: Family Medicine

## 2017-01-22 ENCOUNTER — Encounter: Payer: Self-pay | Admitting: Podiatry

## 2017-01-22 ENCOUNTER — Ambulatory Visit (INDEPENDENT_AMBULATORY_CARE_PROVIDER_SITE_OTHER): Payer: Medicare HMO | Admitting: Podiatry

## 2017-01-22 ENCOUNTER — Encounter (INDEPENDENT_AMBULATORY_CARE_PROVIDER_SITE_OTHER): Payer: Self-pay

## 2017-01-22 DIAGNOSIS — B351 Tinea unguium: Secondary | ICD-10-CM

## 2017-01-22 DIAGNOSIS — M79675 Pain in left toe(s): Secondary | ICD-10-CM

## 2017-01-22 DIAGNOSIS — E1151 Type 2 diabetes mellitus with diabetic peripheral angiopathy without gangrene: Secondary | ICD-10-CM

## 2017-01-22 DIAGNOSIS — L600 Ingrowing nail: Secondary | ICD-10-CM | POA: Diagnosis not present

## 2017-01-22 DIAGNOSIS — M79674 Pain in right toe(s): Secondary | ICD-10-CM | POA: Diagnosis not present

## 2017-01-22 NOTE — Progress Notes (Signed)
Subjective: 66 y.o. returns the office today for painful, elongated, thickened toenails which she cannot trim herself. Denies any redness or drainage around the nails. Denies any acute changes since last appointment and no new complaints today. Denies any systemic complaints such as fevers, chills, nausea, vomiting.   Objective: AAO 3, NAD DP/PT pulses decreased (denies any claudication symptoms) Nails hypertrophic, dystrophic, elongated, brittle, discolored 10. There is tenderness overlying the nails 1-5 bilaterally. There is no surrounding erythema or drainage along the nail sites. Mild incurvation of hallux toenails. No signs of infection.  No open lesions or pre-ulcerative lesions are identified. No other areas of tenderness bilateral lower extremities. No overlying edema, erythema, increased warmth. No pain with calf compression, swelling, warmth, erythema.  Assessment: Patient presents with symptomatic onychomycosis  Plan: -Treatment options including alternatives, risks, complications were discussed -Nails sharply debrided 10 without complication/bleeding. -Discussed daily foot inspection. If there are any changes, to call the office immediately.  -Follow-up in 9 weeks or sooner if any problems are to arise. In the meantime, encouraged to call the office with any questions, concerns, changes symptoms.  Celesta Gentile, DPM

## 2017-03-14 ENCOUNTER — Other Ambulatory Visit: Payer: Self-pay

## 2017-03-14 MED ORDER — CLOPIDOGREL BISULFATE 75 MG PO TABS: 75.0000 mg | ORAL_TABLET | Freq: Every day | ORAL | 0 refills | Status: DC

## 2017-03-14 MED ORDER — ASPIRIN EC 81 MG PO TBEC: 81.0000 mg | DELAYED_RELEASE_TABLET | Freq: Every day | ORAL | 0 refills | Status: DC

## 2017-03-14 NOTE — Telephone Encounter (Signed)
Rx(s) sent to pharmacy electronically.  

## 2017-03-15 ENCOUNTER — Other Ambulatory Visit: Payer: Self-pay | Admitting: *Deleted

## 2017-03-15 DIAGNOSIS — I10 Essential (primary) hypertension: Secondary | ICD-10-CM

## 2017-03-15 DIAGNOSIS — E785 Hyperlipidemia, unspecified: Secondary | ICD-10-CM

## 2017-03-15 MED ORDER — LOSARTAN POTASSIUM 100 MG PO TABS: ORAL_TABLET | ORAL | 0 refills | Status: DC

## 2017-03-15 MED ORDER — ALENDRONATE SODIUM 70 MG PO TABS: 70.0000 mg | ORAL_TABLET | ORAL | 0 refills | Status: DC

## 2017-03-15 MED ORDER — MELOXICAM 7.5 MG PO TABS: 7.5000 mg | ORAL_TABLET | Freq: Every day | ORAL | 0 refills | Status: DC

## 2017-03-15 MED ORDER — SPIRONOLACTONE 25 MG PO TABS: 25.0000 mg | ORAL_TABLET | Freq: Every day | ORAL | 0 refills | Status: DC

## 2017-03-15 MED ORDER — ATORVASTATIN CALCIUM 40 MG PO TABS: 40.0000 mg | ORAL_TABLET | Freq: Every day | ORAL | 0 refills | Status: DC

## 2017-03-18 ENCOUNTER — Telehealth: Payer: Self-pay | Admitting: Family Medicine

## 2017-03-18 NOTE — Telephone Encounter (Signed)
Pharmacy needs clarifiation if ok to take both asa 81 and mobic 7.5 together, pharmacy states could cause increased risk of GI Toxicity. Advise

## 2017-03-18 NOTE — Telephone Encounter (Signed)
Pharmacy informed to send this request to Dr. Percival Spanish

## 2017-03-18 NOTE — Telephone Encounter (Signed)
Cardiology rx both--- they need to speak to cardiology

## 2017-03-20 ENCOUNTER — Other Ambulatory Visit: Payer: Self-pay

## 2017-03-20 ENCOUNTER — Other Ambulatory Visit: Payer: Self-pay | Admitting: *Deleted

## 2017-03-20 MED ORDER — ASPIRIN EC 81 MG PO TBEC: 81.0000 mg | DELAYED_RELEASE_TABLET | Freq: Every day | ORAL | 0 refills | Status: DC

## 2017-03-22 ENCOUNTER — Telehealth: Payer: Self-pay | Admitting: Cardiology

## 2017-03-22 ENCOUNTER — Telehealth: Payer: Self-pay | Admitting: Pulmonary Disease

## 2017-03-22 NOTE — Telephone Encounter (Signed)
Called and spoke with [facility] and they are aware that the pt has not been seen sinc 2016 and we are not able to approve the refills until the pt is seen in our office.

## 2017-03-22 NOTE — Telephone Encounter (Signed)
Combination will increase risk for GI bleed.  Meloxican should not be taken daily as chronic therapy. Please take nly when needed and continue daily aspirin for cardiac protection.

## 2017-03-22 NOTE — Telephone Encounter (Signed)
Pharmacy has been called back with instructions and they verbalized their understanding.   Combination will increase risk for GI bleed.  Meloxican should not be taken daily as chronic therapy. Please take nly when needed and continue daily aspirin for cardiac protection.

## 2017-03-22 NOTE — Telephone Encounter (Signed)
[facility] states that they need clarifiation if it is alright to take aspirin EC 81 MG tablet and meloxicam (MOBIC) 7.5 MG tablet pharmacy states could cause increased risk of GI Toxicity. Needs call back  Reference Number [id number]

## 2017-03-26 ENCOUNTER — Telehealth: Payer: Self-pay | Admitting: Pulmonary Disease

## 2017-03-26 ENCOUNTER — Encounter: Payer: Self-pay | Admitting: Podiatry

## 2017-03-26 ENCOUNTER — Ambulatory Visit (INDEPENDENT_AMBULATORY_CARE_PROVIDER_SITE_OTHER): Payer: Medicare Other | Admitting: Podiatry

## 2017-03-26 DIAGNOSIS — B351 Tinea unguium: Secondary | ICD-10-CM

## 2017-03-26 DIAGNOSIS — M79675 Pain in left toe(s): Secondary | ICD-10-CM | POA: Diagnosis not present

## 2017-03-26 DIAGNOSIS — M79674 Pain in right toe(s): Secondary | ICD-10-CM | POA: Diagnosis not present

## 2017-03-26 DIAGNOSIS — E1151 Type 2 diabetes mellitus with diabetic peripheral angiopathy without gangrene: Secondary | ICD-10-CM | POA: Diagnosis not present

## 2017-03-26 MED ORDER — PIRFENIDONE 267 MG PO CAPS: ORAL_CAPSULE | ORAL | 11 refills | Status: DC

## 2017-03-26 NOTE — Telephone Encounter (Signed)
Spoke with pharmacist, called in refill on Esbriet.  Pt is scheduled for ROV with BQ in September.  Will close encounter.

## 2017-03-26 NOTE — Progress Notes (Signed)
Subjective: 66 y.o. returns the office today for painful, elongated, thickened toenails which she cannot trim herself. Denies any redness or drainage around the nails. Denies any acute changes since last appointment and no new complaints today. Denies any systemic complaints such as fevers, chills, nausea, vomiting.   Objective: AAO 3, NAD DP/PT pulses decreased Nails hypertrophic, dystrophic, elongated, brittle, discolored 10.Nails have yellow to brown discoloration. Thre is incurvation along multiple nails. There is tenderness overlying the nails 1-5 bilaterally. There is no surrounding erythema or drainage along the nail sites. Mild incurvation of hallux toenails. No signs of infection.  No open lesions or pre-ulcerative lesions are identified. No other areas of tenderness bilateral lower extremities. No overlying edema, erythema, increased warmth. No pain with calf compression, swelling, warmth, erythema.  Assessment: Patient presents with symptomatic onychomycosis  Plan: -Treatment options including alternatives, risks, complications were discussed -Nails sharply debrided 10 without complication/bleeding. -Discussed daily foot inspection. If there are any changes, to call the office immediately.  -Follow-up in 9 weeks or sooner if any problems are to arise. In the meantime, encouraged to call the office with any questions, concerns, changes symptoms.  Celesta Gentile, DPM

## 2017-03-26 NOTE — Telephone Encounter (Signed)
Spoke with EE who she states we can provide her with samples but she must keep her appt. Daughter is aware and that samples have been placed up front for pick up. She has no additional questions at this time.

## 2017-04-22 ENCOUNTER — Ambulatory Visit (INDEPENDENT_AMBULATORY_CARE_PROVIDER_SITE_OTHER): Payer: Medicare Other | Admitting: Family Medicine

## 2017-04-22 ENCOUNTER — Telehealth: Payer: Self-pay | Admitting: Family Medicine

## 2017-04-22 ENCOUNTER — Encounter: Payer: Self-pay | Admitting: Family Medicine

## 2017-04-22 ENCOUNTER — Ambulatory Visit (HOSPITAL_BASED_OUTPATIENT_CLINIC_OR_DEPARTMENT_OTHER)
Admission: RE | Admit: 2017-04-22 | Discharge: 2017-04-22 | Disposition: A | Discharge status: Home / Self Care | Payer: Medicare Other | Source: Ambulatory Visit | Attending: Family Medicine | Admitting: Family Medicine

## 2017-04-22 VITALS — BP 140/84 | HR 58 | Temp 97.7°F | Ht 63.0 in | Wt 135.1 lb

## 2017-04-22 DIAGNOSIS — R634 Abnormal weight loss: Secondary | ICD-10-CM | POA: Diagnosis not present

## 2017-04-22 DIAGNOSIS — R059 Cough, unspecified: Secondary | ICD-10-CM

## 2017-04-22 DIAGNOSIS — F17200 Nicotine dependence, unspecified, uncomplicated: Secondary | ICD-10-CM | POA: Diagnosis not present

## 2017-04-22 DIAGNOSIS — R05 Cough: Secondary | ICD-10-CM | POA: Diagnosis not present

## 2017-04-22 DIAGNOSIS — J449 Chronic obstructive pulmonary disease, unspecified: Secondary | ICD-10-CM | POA: Diagnosis not present

## 2017-04-22 DIAGNOSIS — J8410 Pulmonary fibrosis, unspecified: Secondary | ICD-10-CM | POA: Insufficient documentation

## 2017-04-22 DIAGNOSIS — F17209 Nicotine dependence, unspecified, with unspecified nicotine-induced disorders: Secondary | ICD-10-CM | POA: Diagnosis not present

## 2017-04-22 LAB — CBC WITH DIFFERENTIAL/PLATELET
BASOS ABS: 0 10*3/uL (ref 0.0–0.1)
Basophils Relative: 0.6 % (ref 0.0–3.0)
EOS ABS: 0.2 10*3/uL (ref 0.0–0.7)
Eosinophils Relative: 3.4 % (ref 0.0–5.0)
HEMATOCRIT: 41.7 % (ref 36.0–46.0)
HEMOGLOBIN: 13.3 g/dL (ref 12.0–15.0)
LYMPHS PCT: 37 % (ref 12.0–46.0)
Lymphs Abs: 1.8 10*3/uL (ref 0.7–4.0)
MCHC: 32 g/dL (ref 30.0–36.0)
MCV: 99.6 fl (ref 78.0–100.0)
MONOS PCT: 6.4 % (ref 3.0–12.0)
Monocytes Absolute: 0.3 10*3/uL (ref 0.1–1.0)
NEUTROS ABS: 2.6 10*3/uL (ref 1.4–7.7)
Neutrophils Relative %: 52.6 % (ref 43.0–77.0)
PLATELETS: 155 10*3/uL (ref 150.0–400.0)
RBC: 4.18 Mil/uL (ref 3.87–5.11)
RDW: 14 % (ref 11.5–15.5)
WBC: 4.8 10*3/uL (ref 4.0–10.5)

## 2017-04-22 LAB — COMPREHENSIVE METABOLIC PANEL
ALK PHOS: 54 U/L (ref 39–117)
ALT: 16 U/L (ref 0–35)
AST: 18 U/L (ref 0–37)
Albumin: 3.9 g/dL (ref 3.5–5.2)
BILIRUBIN TOTAL: 0.4 mg/dL (ref 0.2–1.2)
BUN: 12 mg/dL (ref 6–23)
CALCIUM: 8.8 mg/dL (ref 8.4–10.5)
CO2: 23 meq/L (ref 19–32)
CREATININE: 0.97 mg/dL (ref 0.40–1.20)
Chloride: 109 mEq/L (ref 96–112)
GFR: 73.43 mL/min (ref >60.00)
Glucose, Bld: 80 mg/dL (ref 70–99)
Potassium: 4.4 mEq/L (ref 3.5–5.1)
Sodium: 140 mEq/L (ref 135–145)
TOTAL PROTEIN: 6.3 g/dL (ref 6.0–8.3)

## 2017-04-22 LAB — TSH: TSH: 1.41 u[IU]/mL (ref 0.35–4.50)

## 2017-04-22 LAB — T3, FREE: T3 FREE: 3.1 pg/mL (ref 2.3–4.2)

## 2017-04-22 LAB — T4, FREE: Free T4: 0.84 ng/dL (ref 0.60–1.60)

## 2017-04-22 MED ORDER — CALCIUM CARBONATE 1250 (500 CA) MG PO TABS: 1.0000 | ORAL_TABLET | Freq: Every day | ORAL | 3 refills | Status: DC

## 2017-04-22 NOTE — Progress Notes (Signed)
Patient ID: Christine Meyer, female    DOB: 1950/09/28  Age: 66 y.o. MRN: [id number]    Subjective:  Subjective  HPI Christine Meyer presents for cough with white mucus that she has had all summer.  Her appointment with pulmonary is not until Sept.  No fever ,  She is also c/o weight loss -- she has lost 11 lbs since her last ov.  Pt states she is not trying to lose weight.  She has been under a lot of stress.  She recently lost her job and is looking for another one.    Review of Systems  Constitutional: Positive for unexpected weight change. Negative for activity change, appetite change, chills and fever.  HENT: Negative for congestion, postnasal drip, rhinorrhea and sinus pressure.   Respiratory: Positive for cough. Negative for chest tightness, shortness of breath and wheezing.   Cardiovascular: Negative for chest pain, palpitations and leg swelling.  Gastrointestinal: Negative for abdominal distention, abdominal pain, anal bleeding, blood in stool, constipation, diarrhea, nausea, rectal pain and vomiting.  Allergic/Immunologic: Negative for environmental allergies.    History Past Medical History:  Diagnosis Date  . Anemia    NOS  . CAD (coronary artery disease)    Stent RCA 1999  . COPD (chronic obstructive pulmonary disease) ([place])   . Diabetes mellitus, type 2 (HCC)    Diet controlled  . Hyperlipidemia   . Hypertension   . OSA on CPAP   . PVD (peripheral vascular disease) (HCC)    Carotid stenosis, renal artery stenosis  . Sleep apnea    dx'd but doesn't use mask   . Uterine cancer ([place]) ?9678'L    She has a past surgical history that includes Renal artery stent (Right, May 2007; 08/2006); Coronary angioplasty with stent (1999); Colonoscopy (2000); Iliac artery stent (Right, 11/22/2014); Total abdominal hysterectomy (?1980's); lower extremity angiogram (N/A, 11/22/2014); and abdominal angiogram (11/22/2014).   Her family history includes Deep vein thrombosis in her mother;  Diabetes in her brother, maternal aunt, maternal grandmother, mother, and sister; Emphysema in her brother; Heart attack in her paternal aunt; Heart failure in her mother; Hypertension in her brother, father, and mother.She reports that she has been smoking Cigarettes.  She has a 13.75 pack-year smoking history. She has never used smokeless tobacco. She reports that she does not drink alcohol or use drugs.  Current Outpatient Prescriptions on File Prior to Visit  Medication Sig Dispense Refill  . ACCU-CHEK SOFTCLIX LANCETS lancets Check blood sugar once daily. Dx:E11.9 100 each 5  . alendronate (FOSAMAX) 70 MG tablet Take 1 tablet (70 mg total) by mouth once a week. Take with a full glass of water on an empty stomach. 12 tablet 0  . aspirin EC 81 MG tablet Take 1 tablet (81 mg total) by mouth daily. 90 tablet 0  . atorvastatin (LIPITOR) 40 MG tablet Take 1 tablet (40 mg total) by mouth daily. 90 tablet 0  . Blood Glucose Monitoring Suppl (ACCU-CHEK AVIVA PLUS) W/DEVICE KIT Check Blood sugar daily. Dx E11.9 1 kit 0  . budesonide-formoterol (SYMBICORT) 160-4.5 MCG/ACT inhaler Inhale 2 puffs into the lungs 2 (two) times daily. 1 Inhaler 6  . Cholecalciferol (VITAMIN D) 2000 units CAPS Take 1 capsule (2,000 Units total) by mouth daily. 90 capsule 3  . clopidogrel (PLAVIX) 75 MG tablet Take 1 tablet (75 mg total) by mouth daily. 90 tablet 0  . glucose blood (ONETOUCH VERIO) test strip Check blood sugar once daily. Dx:E11.9 50 each 12  .  IRON PO Take 1 tablet by mouth daily.    Marland Kitchen losartan (COZAAR) 100 MG tablet TAKE 1 TABLET(100 MG) BY MOUTH DAILY 90 tablet 0  . meloxicam (MOBIC) 7.5 MG tablet Take 1 tablet (7.5 mg total) by mouth daily. 90 tablet 0  . metoprolol tartrate (LOPRESSOR) 25 MG tablet TAKE 1 TABLET BY MOUTH TWICE DAILY 180 tablet 1  . Multiple Vitamins-Minerals (A THRU Z SELECT 50+ ADVANCED) TABS TAKE 1 TABLET BY MOUTH DAILY 220 tablet 1  . Pirfenidone (ESBRIET) 267 MG CAPS TAKE 2 CAPSULES BY  MOUTH THREE TIMES A DAY 180 capsule 11  . spironolactone (ALDACTONE) 25 MG tablet Take 1 tablet (25 mg total) by mouth daily. 90 tablet 0   No current facility-administered medications on file prior to visit.      Objective:  Objective  Physical Exam  Constitutional: She is oriented to person, place, and time. She appears well-developed and well-nourished.  HENT:  Head: Normocephalic and atraumatic.  Right Ear: External ear normal.  Left Ear: External ear normal.  + PND + errythema  Eyes: Conjunctivae and EOM are normal. Right eye exhibits no discharge. Left eye exhibits no discharge.  Neck: Normal range of motion. Neck supple. No JVD present. Carotid bruit is not present. No thyromegaly present.  Cardiovascular: Normal rate, regular rhythm and normal heart sounds.   No murmur heard. Pulmonary/Chest: Effort normal. No respiratory distress. She has decreased breath sounds. She has no wheezes. She has no rales. She exhibits no tenderness.  Musculoskeletal: She exhibits no edema.  Lymphadenopathy:    She has cervical adenopathy.  Neurological: She is alert and oriented to person, place, and time.  Psychiatric: Her speech is normal. Judgment normal. Her mood appears not anxious. Her affect is not angry, not blunt, not labile and not inappropriate. She is slowed. Thought content is not paranoid and not delusional. Cognition and memory are normal. She exhibits a depressed mood. She expresses no homicidal and no suicidal ideation. She expresses no suicidal plans and no homicidal plans.  Nursing note and vitals reviewed.  BP 140/84 (BP Location: Left Arm, Patient Position: Sitting, Cuff Size: Normal)   Pulse (!) 58   Temp 97.7 F (36.5 C) (Oral)   Ht 5' 3"  (1.6 m)   Wt 135 lb 2 oz (61.3 kg)   SpO2 99%   BMI 23.94 kg/m  Wt Readings from Last 3 Encounters:  04/22/17 135 lb 2 oz (61.3 kg)  10/15/16 145 lb 12.8 oz (66.1 kg)  07/04/16 146 lb (66.2 kg)     Lab Results  Component Value  Date   WBC 4.8 04/22/2017   HGB 13.3 04/22/2017   HCT 41.7 04/22/2017   PLT 155.0 04/22/2017   GLUCOSE 80 04/22/2017   CHOL 155 10/15/2016   TRIG 71.0 10/15/2016   HDL 59.20 10/15/2016   LDLCALC 82 10/15/2016   ALT 16 04/22/2017   AST 18 04/22/2017   NA 140 04/22/2017   K 4.4 04/22/2017   CL 109 04/22/2017   CREATININE 0.97 04/22/2017   BUN 12 04/22/2017   CO2 23 04/22/2017   TSH 1.41 04/22/2017   INR 0.95 11/11/2014   HGBA1C 6.1 10/15/2016   MICROALBUR <0.7 03/10/2015    Dg Bone Density  Result Date: 10/29/2016 EXAM: DUAL X-RAY ABSORPTIOMETRY (DXA) FOR BONE MINERAL DENSITY IMPRESSION: Referring Physician:  Rosalita Chessman CHASE PATIENT: Name: Glyn, Zendejas Patient ID: [id number] Birth Date: 04-13-1951 Height: 63.0 in. Sex: Female Measured: 10/29/2016 Weight: 145.8 lbs. Indications: African-American, Diabetic,  Estrogen Deficiency, Hysterectomy, Post Menopausal, Tobacco User(Current), Tobacco User (Current Smoker) Fractures: Treatments: Calcium, Multivitamin, Vitamin D ASSESSMENT: The BMD measured at Femur Neck Right is 0.855 g/cm2 with a T-score of -1.3. This patient is considered osteopenic according to [facility]) criteria. L-3 & 4 was excluded due to degenerative changes. Site Region Measured Date Measured Age WHO YA BMD Classification T-score AP Spine L1-L2 10[age]29/2016 [age] Normal 0.1 1.178 g/cm2 DualFemur Neck Right 8/29/2016 [age] Osteopenia -1.3 0.855 g/cm2 World Health Organization [facility]) criteria for post-menopausal, Caucasian Women: Normal       T-score at or above -1 SD Osteopenia   T-score between -1 and -2.5 SD Osteoporosis T-score at or below -2.5 SD RECOMMENDATION: [place] recommends that FDA-approved medical therapies be considered in postmenopausal women and men age 64 or older with a: 1. Hip or vertebral (clinical or morphometric) fracture. 2. T-score of < -2.5 at the spine or hip. 3. Ten-year fracture probability by FRAX of 3%  or greater for hip fracture or 20% or greater for major osteoporotic fracture. All treatment decisions require clinical judgment and consideration of individual patient factors, including patient preferences, co-morbidities, previous drug use, risk factors not captured in the FRAX model (e.g. falls, vitamin D deficiency, increased bone turnover, interval significant decline in bone density) and possible under - or over-estimation of fracture risk by FRAX. All patients should ensure an adequate intake of dietary calcium (1200 mg/d) and vitamin D (800 IU daily) unless contraindicated. FOLLOW-UP: People with diagnosed cases of osteoporosis or at high risk for fracture should have regular bone mineral density tests. For patients eligible for Medicare, routine testing is allowed once every 2 years. The testing frequency can be increased to one year for patients who have rapidly progressing disease, those who are receiving or discontinuing medical therapy to restore bone mass, or have additional risk factors. I have reviewed this report and agree with the above findings. [facility] Patient: Christine Meyer Referring Physician: Rosalita Chessman CHASE Birth Date: 1951-01-25 [age] Patient ID: [id number] Height: 63.0 in. Weight: 145.8 lbs. Measured: 10/29/2016 9:31:03 AM (16 SP 2) Sex: Female Ethnicity: White Analyzed: 10/29/2016 9:31:33 AM (16 SP 2) FRAX* 10-year Probability of Fracture Based on femoral neck BMD: DualFemur (Right) Major Osteoporotic Fracture: 9.3% Hip Fracture:                1.7% Population:                  [place] (Caucasian) Risk Factors:                Tobacco User (Current Smoker) *FRAX is a Materials engineer of the [employer] for Metabolic Bone Disease, a World Pharmacologist (WHO) [facility]. ASSESSMENT: The probability of a major osteoporotic fracture is 9.3% within the next ten years. The probability of a hip fracture is 1.7% within the  next ten years. Electronically Signed   By: Lowella Grip III M.D.   On: 10/29/2016 09:58   Mm Digital Screening Bilateral  Result Date: 10/29/2016 CLINICAL DATA:  Screening. EXAM: DIGITAL SCREENING BILATERAL MAMMOGRAM WITH CAD COMPARISON:  Previous exam(s). ACR Breast Density Category b: There are scattered areas of fibroglandular density. FINDINGS: There are no findings suspicious for malignancy. Images were processed with CAD. IMPRESSION: No mammographic evidence of malignancy. A result letter of this screening mammogram will be mailed directly to the patient. RECOMMENDATION: Screening mammogram in one  year. (Code:SM-B-01Y) BI-RADS CATEGORY  1: Negative. Electronically Signed   By: Lajean Manes M.D.   On: 10/29/2016 15:17     Assessment & Plan:  Plan  I am having Ms. Wellborn maintain her IRON PO, ACCU-CHEK AVIVA PLUS, ACCU-CHEK SOFTCLIX LANCETS, budesonide-formoterol, A THRU Z SELECT 50+ ADVANCED, glucose blood, Vitamin D, metoprolol tartrate, clopidogrel, alendronate, atorvastatin, losartan, spironolactone, meloxicam, aspirin EC, Pirfenidone, and calcium carbonate.  Meds ordered this encounter  Medications  . calcium carbonate (OS-CAL - DOSED IN MG OF ELEMENTAL CALCIUM) 1250 (500 Ca) MG tablet    Sig: Take 1 tablet (500 mg of elemental calcium total) by mouth daily with breakfast.    Dispense:  90 tablet    Refill:  3    Problem List Items Addressed This Visit      Unprioritized   Cough    Pt has pulm f/u but with inc cough we will check cxr Pt is using reg meds daily No wheezing or sob per pt      Relevant Orders   CBC with Differential/Platelet (Completed)   Comprehensive metabolic panel (Completed)   DG Chest 2 View (Completed)   Current every day smoker    Pt has been smoking since she was in elementary school per daughter Refer for lung cancer screening       Relevant Orders   Ambulatory Referral for Lung Cancer Scre   Weight loss - Primary    Pt to add boost or  ensure daily Daily weights Recheck 2-3 weeks  Refer for lung cancer screening program---- Check labs       Relevant Orders   CBC with Differential/Platelet (Completed)   Comprehensive metabolic panel (Completed)   TSH (Completed)   T3, free (Completed)   T4, free (Completed)      Follow-up: Return in about 3 weeks (around 05/13/2017), or if symptoms worsen or fail to improve, for weight.  Ann Held, DO

## 2017-04-22 NOTE — Telephone Encounter (Signed)
Caller name:Lucy Relationship to patient:Daughter Can be reached: Pharmacy:  Reason for call:Referral to GI for colonoscopy? Please advise

## 2017-04-22 NOTE — Assessment & Plan Note (Signed)
Pt has pulm f/u but with inc cough we will check cxr Pt is using reg meds daily No wheezing or sob per pt

## 2017-04-22 NOTE — Assessment & Plan Note (Addendum)
Pt to add boost or ensure daily Daily weights Recheck 2-3 weeks  Refer for lung cancer screening program---- Check labs

## 2017-04-22 NOTE — Patient Instructions (Signed)
Failure to Thrive, Adult Failure to thrive is a group of symptoms that affect elderly adults. These symptoms include loss of appetite and weight loss. People who have this condition may do fewer and fewer activities over time. They may lose interest in being with friends or they may not want to eat or drink. This condition is not a normal part of aging. What are the causes? This condition may be caused by:  A disease, such dementia, diabetes, cancer, or lung disease.  A health problem, such as a vitamin deficiency or a heart problem.  A disorder, such as depression.  A disability.  Medicines.  Mistreatment or neglect.  In some cases, the cause may not be known. What are the signs or symptoms? Symptoms of this condition include:  Loss of more than 5% of your body weight.  Being more tired than normal after an activity.  Having trouble getting up after sitting.  Loss of appetite.  Not getting out of bed.  Not wanting to do usual activities.  Depression.  Getting infections often.  Bedsores.  Taking a long time to recover after an injury or a surgery.  Weakness.  How is this diagnosed? This condition may be diagnosed with a physical exam. Your health care provider will ask questions about your health, behavior, and mood, such as:  Has your activity changed?  Do you seem sad?  Are your eating habits different?  Tests may also be done. They may include:  Blood tests.  Urine tests.  Imaging tests, such as X-rays, a CT scan, or MRI.  Hearing tests.  Vision tests.  Tests to check thinking ability (cognitive tests).  Activity tests to see if you can do tasks such as bathing and dressing and to see if you can move around safely.  You may be referred to a specialist. How is this treated? Treatment for this condition depends on the cause. It may involve:  Treating the cause.  Talk therapy or medicine to treat depression.  Improving diet, such as by  eating more often or taking nutritional supplements.  Changing or stopping a medicine.  Physical therapy.  It often takes a team of health care providers to find the right treatment. Follow these instructions at home:  Take over-the-counter and prescription medicines only as told by your health care provider.  Eat a healthy, well-balanced diet. Make sure to get enough calories in each meal.  Be physically active. Include strength training as part of your exercise routine. A physical therapist can help to set up an exercise program that fits you.  Make sure that you are safe at home.  Make sure that you have a plan for what to do if you become unable to make decisions for yourself. Contact a health care provider if:  You are not able to eat well.  You are not able to move around.  You feel very sad or hopeless. Get help right away if:  You have thoughts of ending your life.  You cannot eat or drink.  You do not get out of bed.  Staying at home is no longer safe.  You have a fever. This information is not intended to replace advice given to you by your health care provider. Make sure you discuss any questions you have with your health care provider. Document Released: 11/19/2011 Document Revised: 02/02/2016 Document Reviewed: 11/22/2014 Elsevier Interactive Patient Education  2018 Elsevier Inc.  

## 2017-04-22 NOTE — Assessment & Plan Note (Signed)
Pt has been smoking since she was in elementary school per daughter Refer for lung cancer screening

## 2017-04-22 NOTE — Progress Notes (Signed)
Pre visit review using our clinic review tool, if applicable. No additional management support is needed unless otherwise documented below in the visit note. 

## 2017-04-22 NOTE — Telephone Encounter (Signed)
Informed the daughter that if she does not gain PCP will refer to GI for colonoscopy. The daughter did verbalized understanding.

## 2017-04-29 ENCOUNTER — Other Ambulatory Visit: Payer: Self-pay | Admitting: Family Medicine

## 2017-04-30 ENCOUNTER — Other Ambulatory Visit: Payer: Self-pay | Admitting: Acute Care

## 2017-04-30 DIAGNOSIS — F17210 Nicotine dependence, cigarettes, uncomplicated: Secondary | ICD-10-CM

## 2017-05-02 ENCOUNTER — Other Ambulatory Visit: Payer: Self-pay | Admitting: Cardiology

## 2017-05-02 ENCOUNTER — Other Ambulatory Visit: Payer: Self-pay | Admitting: Acute Care

## 2017-05-02 DIAGNOSIS — Z122 Encounter for screening for malignant neoplasm of respiratory organs: Secondary | ICD-10-CM

## 2017-05-02 DIAGNOSIS — F17210 Nicotine dependence, cigarettes, uncomplicated: Principal | ICD-10-CM

## 2017-05-03 ENCOUNTER — Other Ambulatory Visit: Payer: Self-pay | Admitting: Family Medicine

## 2017-05-03 DIAGNOSIS — I10 Essential (primary) hypertension: Secondary | ICD-10-CM

## 2017-05-03 DIAGNOSIS — E785 Hyperlipidemia, unspecified: Secondary | ICD-10-CM

## 2017-05-03 MED ORDER — METOPROLOL TARTRATE 25 MG PO TABS: 25.0000 mg | ORAL_TABLET | Freq: Two times a day (BID) | ORAL | 3 refills | Status: DC

## 2017-05-03 MED ORDER — METOPROLOL TARTRATE 25 MG PO TABS: 25.0000 mg | ORAL_TABLET | Freq: Two times a day (BID) | ORAL | 0 refills | Status: DC

## 2017-05-03 NOTE — Telephone Encounter (Signed)
Refaxed to local pharm per pt req/thx dmf

## 2017-05-08 ENCOUNTER — Ambulatory Visit (INDEPENDENT_AMBULATORY_CARE_PROVIDER_SITE_OTHER)
Admission: RE | Admit: 2017-05-08 | Discharge: 2017-05-08 | Disposition: A | Discharge status: Home / Self Care | Payer: Medicare Other | Source: Ambulatory Visit | Attending: Acute Care | Admitting: Acute Care

## 2017-05-08 ENCOUNTER — Ambulatory Visit (INDEPENDENT_AMBULATORY_CARE_PROVIDER_SITE_OTHER): Payer: Medicare Other | Admitting: Acute Care

## 2017-05-08 ENCOUNTER — Encounter: Payer: Self-pay | Admitting: Acute Care

## 2017-05-08 DIAGNOSIS — F17210 Nicotine dependence, cigarettes, uncomplicated: Secondary | ICD-10-CM

## 2017-05-08 DIAGNOSIS — Z87891 Personal history of nicotine dependence: Secondary | ICD-10-CM | POA: Diagnosis not present

## 2017-05-08 DIAGNOSIS — Z122 Encounter for screening for malignant neoplasm of respiratory organs: Secondary | ICD-10-CM

## 2017-05-08 NOTE — Progress Notes (Signed)
Shared Decision Making Visit Lung Cancer Screening Program [phone])   Eligibility:  Age 66 y.o.  Pack Years Smoking History Calculation: 30.6-pack-year smoking history (# packs/per year x # years smoked)  Recent History of coughing up blood  No  Unexplained weight loss? No ( >Than 15 pounds within the last 6 months )  Prior History Lung / other cancer no (Diagnosis within the last 5 years already requiring surveillance chest CT Scans).  Smoking Status Current Smoker  Former Smokers: Years since quit: Not applicable current smoker  Quit Date: Not applicable current smoker  Visit Components:  Discussion included one or more decision making aids. yes  Discussion included risk/benefits of screening. yes  Discussion included potential follow up diagnostic testing for abnormal scans. yes  Discussion included meaning and risk of over diagnosis. yes  Discussion included meaning and risk of False Positives. yes  Discussion included meaning of total radiation exposure. yes  Counseling Included:  Importance of adherence to annual lung cancer LDCT screening. yes  Impact of comorbidities on ability to participate in the program. yes  Ability and willingness to under diagnostic treatment. yes  Smoking Cessation Counseling:  Current Smokers:   Discussed importance of smoking cessation. yes  Information about tobacco cessation classes and interventions provided to patient. yes  Patient provided with "ticket" for LDCT Scan. yes  Symptomatic Patient. no  Counseling: Patient is asymptomatic  Diagnosis Code: Tobacco Use Z72.0  Asymptomatic Patient yes  Counseling (Intermediate counseling: > three minutes counseling) O6712  Former Smokers:   Discussed the importance of maintaining cigarette abstinence. yes  Diagnosis Code: Personal History of Nicotine Dependence. W58.099  Information about tobacco cessation classes and interventions provided to patient. Yes  Patient  provided with "ticket" for LDCT Scan. yes  Written Order for Lung Cancer Screening with LDCT placed in Epic. Yes (CT Chest Lung Cancer Screening Low Dose W/O CM) [id number] Z12.2-Screening of respiratory organs Z87.891-Personal history of nicotine dependence  I have spent 25 minutes of face to face time with Christine Meyer and her daughter discussing the risks and benefits of lung cancer screening. We viewed a power point together that explained in detail the above noted topics. We paused at intervals to allow for questions to be asked and answered to ensure understanding.We discussed that the single most powerful action that she can take to decrease her risk of developing lung cancer is to quit smoking. We discussed whether or not she is ready to commit to setting a quit date. She is currently not ready to set a quit date. We discussed options for tools to aid in quitting smoking including nicotine replacement therapy, non-nicotine medications, support groups, Quit Smart classes, and behavior modification. We discussed that often times setting smaller, more achievable goals, such as eliminating 1 cigarette a day for a week and then 2 cigarettes a day for a week can be helpful in slowly decreasing the number of cigarettes smoked. This allows for a sense of accomplishment as well as providing a clinical benefit. I gave Christine Meyer the " Be Stronger Than Your Excuses" card with contact information for community resources, classes, free nicotine replacement therapy, and access to mobile apps, text messaging, and on-line smoking cessation help. I have also given her my card and contact information in the event she needs to contact me. We discussed the time and location of the scan, and that either Doroteo Glassman RN or I will call with the results within 24-48 hours of receiving them. I have offered  Christine Meyer  a copy of the power point we viewed  as a resource in the event they need reinforcement of the concepts we  discussed today in the office. The patient verbalized understanding of all of  the above and had no further questions upon leaving the office. They have my contact information in the event they have any further questions.  I spent 4-5 minutes counseling on smoking cessation and the health risks of continued tobacco abuse.  I explained to the patient that there has been a high incidence of coronary artery disease noted on these exams. I explained that this is a non-gated exam therefore degree or severity cannot be determined. This patient is currently on statin therapy. I have asked the patient to follow-up with their PCP regarding any incidental finding of coronary artery disease and management with diet or medication as their PCP  feels is clinically indicated. The patient verbalized understanding of the above and had no further questions upon completion of the visit.        Christine Spatz, NP 05/08/2017

## 2017-05-09 ENCOUNTER — Other Ambulatory Visit: Payer: Self-pay | Admitting: Acute Care

## 2017-05-09 ENCOUNTER — Telehealth: Payer: Self-pay | Admitting: Acute Care

## 2017-05-09 DIAGNOSIS — Z122 Encounter for screening for malignant neoplasm of respiratory organs: Secondary | ICD-10-CM

## 2017-05-09 DIAGNOSIS — F17210 Nicotine dependence, cigarettes, uncomplicated: Principal | ICD-10-CM

## 2017-05-09 NOTE — Telephone Encounter (Signed)
Please note [name]'s low-dose screening CT was read as a lung RADS 1.Lung RADS 1, negative study: no nodules or definitely benign nodules. Radiology recommendation is for a repeat LDCT in 12 months. Recommendation is for annual screening, we will schedule her for August 2019. 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 Usual interstitial pneumonitis is considered less likely given relative lack of progression.Please follow up as you feel is clinically indicated. Please let me know if you would  like me to schedule her to see one of our pulmonologists. Thank you again for referring to the lung cancer screening clinic. Please let me know if you have any questions or concerns regarding the above.

## 2017-05-10 ENCOUNTER — Other Ambulatory Visit: Payer: Self-pay | Admitting: Family Medicine

## 2017-05-16 NOTE — Telephone Encounter (Signed)
She actually sees Dr Lake Bells--- she has an appointment for oct ---  Do you think she should be seen sooner?

## 2017-05-17 NOTE — Telephone Encounter (Signed)
No.. That should be fine. Thanks so much!!

## 2017-05-28 ENCOUNTER — Ambulatory Visit (INDEPENDENT_AMBULATORY_CARE_PROVIDER_SITE_OTHER): Payer: Medicare Other | Admitting: Podiatry

## 2017-05-28 ENCOUNTER — Encounter: Payer: Self-pay | Admitting: Podiatry

## 2017-05-28 DIAGNOSIS — M79675 Pain in left toe(s): Secondary | ICD-10-CM | POA: Diagnosis not present

## 2017-05-28 DIAGNOSIS — M79674 Pain in right toe(s): Secondary | ICD-10-CM | POA: Diagnosis not present

## 2017-05-28 DIAGNOSIS — B351 Tinea unguium: Secondary | ICD-10-CM

## 2017-05-28 DIAGNOSIS — E1151 Type 2 diabetes mellitus with diabetic peripheral angiopathy without gangrene: Secondary | ICD-10-CM | POA: Diagnosis not present

## 2017-05-28 NOTE — Progress Notes (Signed)
Subjective: 66 y.o. returns the office today for painful, elongated, thickened toenails which she cannot trim herself. Denies any redness or drainage around the nails. Also presents for foot evaluation. Denies any acute changes since last appointment and no new complaints today. Denies any systemic complaints such as fevers, chills, nausea, vomiting.   Objective: AAO 3, NAD DP/PT pulses decreased Nails hypertrophic, dystrophic, elongated, brittle, discolored 10.Nails have yellow to brown discoloration. Thre is incurvation along multiple nails. There is tenderness overlying the nails 1-5 bilaterally. There is no surrounding erythema or drainage along the nail sites. Incurvation of hallux toenails. No signs of infection.  No open lesions or pre-ulcerative lesions are identified. No other areas of tenderness bilateral lower extremities. No overlying edema, erythema, increased warmth. No pain with calf compression, swelling, warmth, erythema.  Assessment: Patient presents with symptomatic onychomycosis  Plan: -Treatment options including alternatives, risks, complications were discussed -Nails sharply debrided 10 without complication/bleeding. -Discussed daily foot inspection. If there are any changes, to call the office immediately.  -Follow-up in 9 weeks or sooner if any problems are to arise. In the meantime, encouraged to call the office with any questions, concerns, changes symptoms.  Celesta Gentile, DPM

## 2017-05-30 ENCOUNTER — Ambulatory Visit: Payer: Medicare HMO | Admitting: Pulmonary Disease

## 2017-06-25 ENCOUNTER — Ambulatory Visit (INDEPENDENT_AMBULATORY_CARE_PROVIDER_SITE_OTHER)
Admission: RE | Admit: 2017-06-25 | Discharge: 2017-06-25 | Disposition: A | Discharge status: Home / Self Care | Payer: Medicare Other | Source: Ambulatory Visit | Attending: Pulmonary Disease | Admitting: Pulmonary Disease

## 2017-06-25 ENCOUNTER — Encounter: Payer: Self-pay | Admitting: Pulmonary Disease

## 2017-06-25 ENCOUNTER — Ambulatory Visit (INDEPENDENT_AMBULATORY_CARE_PROVIDER_SITE_OTHER): Payer: Medicare Other | Admitting: Pulmonary Disease

## 2017-06-25 VITALS — BP 128/60 | HR 97 | Ht 62.0 in | Wt 136.2 lb

## 2017-06-25 DIAGNOSIS — J209 Acute bronchitis, unspecified: Secondary | ICD-10-CM | POA: Diagnosis not present

## 2017-06-25 DIAGNOSIS — J84112 Idiopathic pulmonary fibrosis: Secondary | ICD-10-CM | POA: Diagnosis not present

## 2017-06-25 DIAGNOSIS — J440 Chronic obstructive pulmonary disease with acute lower respiratory infection: Secondary | ICD-10-CM

## 2017-06-25 DIAGNOSIS — Z23 Encounter for immunization: Secondary | ICD-10-CM

## 2017-06-25 DIAGNOSIS — J8410 Pulmonary fibrosis, unspecified: Secondary | ICD-10-CM

## 2017-06-25 DIAGNOSIS — F17210 Nicotine dependence, cigarettes, uncomplicated: Secondary | ICD-10-CM | POA: Diagnosis not present

## 2017-06-25 DIAGNOSIS — J432 Centrilobular emphysema: Secondary | ICD-10-CM

## 2017-06-25 DIAGNOSIS — J449 Chronic obstructive pulmonary disease, unspecified: Secondary | ICD-10-CM | POA: Diagnosis not present

## 2017-06-25 MED ORDER — BENZONATATE 200 MG PO CAPS: 200.0000 mg | ORAL_CAPSULE | Freq: Three times a day (TID) | ORAL | 1 refills | Status: DC | PRN

## 2017-06-25 MED ORDER — AZITHROMYCIN 250 MG PO TABS: ORAL_TABLET | ORAL | 0 refills | Status: DC

## 2017-06-25 MED ORDER — PREDNISONE 20 MG PO TABS: 20.0000 mg | ORAL_TABLET | Freq: Every day | ORAL | 0 refills | Status: DC

## 2017-06-25 MED ORDER — BUDESONIDE-FORMOTEROL FUMARATE 160-4.5 MCG/ACT IN AERO: 2.0000 | INHALATION_SPRAY | Freq: Two times a day (BID) | RESPIRATORY_TRACT | 0 refills | Status: DC

## 2017-06-25 MED ORDER — BUDESONIDE-FORMOTEROL FUMARATE 160-4.5 MCG/ACT IN AERO: 2.0000 | INHALATION_SPRAY | Freq: Two times a day (BID) | RESPIRATORY_TRACT | 3 refills | Status: DC

## 2017-06-25 NOTE — Progress Notes (Signed)
Subjective:    Patient ID: Christine Meyer, female    DOB: [birth year], 66 y.o.   MRN: [id number]  Synopsis: Christine Meyer was referred to the [facility] in 2016 for evaluation of shortness of breath. She had a CT chest performed which showed evidence of usual interstitial pneumonitis as well as centrilobular emphysema. She has a significant smoking history.  April 2016 rheumatology visit (Dr. Trudie Reed no evidence of connective tissue disease    HPI Chief Complaint  Patient presents with  . Follow-up    pt states she is doing well, does note prod cough with white mucus with blood tinged mucus X2 on Friday.    Christine Meyer has been doing OK.  She says that she has cough with white phlegm production.  However she coughed up some blood last Friday.  She had been coughing a lot prior to this.  She continues to cough up plavix and aspirin.  She hasn't seen blood.  The cough is a little better with mucinex. No fever, no chills.    She says that her breathing has been OK.  She has been staying active.  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    She doesn't slow down due to breathing.  She has some dyspnea when she vacuums.    No pneumonia or bronchitis.    No sinus congestion or post nasal drip.   Past Medical History:  Diagnosis Date  . Anemia    NOS  . CAD (coronary artery disease)    Stent RCA 1999  . COPD (chronic obstructive pulmonary disease) ([place])   . Diabetes mellitus, type 2 (HCC)    Diet controlled  . Hyperlipidemia   . Hypertension   . OSA on CPAP   . PVD (peripheral vascular disease) (HCC)    Carotid stenosis, renal artery stenosis  . Sleep apnea    dx'd but doesn't use mask   . Uterine cancer ([place]) ?1980's      Review of Systems  Constitutional: Negative for chills, fatigue and fever.  HENT: Negative for postnasal drip, rhinorrhea and sinus pressure.   Respiratory: Positive for shortness of breath. Negative for cough  and wheezing.   Cardiovascular: Negative for chest pain, palpitations and leg swelling.       Objective:   Physical Exam Vitals:   06/25/17 1529  BP: 128/60  Pulse: 97  SpO2: 100%  Weight: 136 lb 3.2 oz (61.8 kg)  Height: 5' 2"  (1.575 m)   RA  Gen: well appearing HENT: OP clear, TM's clear, neck supple PULM: crackles, rhonchi RLL, RUL wheezing B, normal percussion CV: RRR, no mgr, trace edema GI: BS+, soft, nontender Derm: no cyanosis or rash Psyche: normal mood and affect   Chest imaging: 12 2015 CT chest> no pulmonary embolism, moderate centrilobular emphysema, there is interstitial septal thickening as well as honeycombing in a patchy and peripheral distribution with a clear craniocaudal gradient. This is consistent with usual interstitial pneumonitis.  August 2018 low-dose lung cancer screening CT: Showed emphysema, lung RA DS score 1, pulmonary fibrosis pattern indicative of nonspecific interstitial pneumonitis. No progression of fibrotic changes noted compared to the last CT.images independently reviewed, emphysema noted in the upper lobes, interlobular septal thickening and honeycombing in a peripheral distribution worse in the bases   Labs: 10/2014 ANA, Aldolase, CCP, SCL 70, SSA/SSB, Jo-1 all negative; however ESR 40, RF 17 (both elevated) August 2018 liver function testing normal  PFT:  11/2014 PFT> Ratio 73%, FEV1 1.71L (98% pred), TLC 4.16L (87% pred), DLCO 8.31 (38% pred) October 2016 pulmonary function testing ratio 83%, FEV1 2.00 L (113% predicted), FVC 2.41 L (106% predicted), total lung capacity 3.82 L (80% predicted) C, DLCO 9.51 (44% predicted).      Assessment & Plan:   IPF (idiopathic pulmonary fibrosis) ([place]) - Plan: DG Chest 2 View, Pulmonary function test  Usual interstitial pneumonitis  Cigarette smoker  Centrilobular emphysema (HCC)  Acute bronchitis with COPD ([place])  Discussion: Christine Meyer comes back to see me today for the first time in 2  years. We talked about the fact that even though she takes an unorthodox dose of Esbriet the CT scan of her chest showed no evidence of progression which is really great. She's not having any significant shortness of breath outside the acute episode of bronchitis she's been experiencing for the last week. She did have some hemoptysis recently so I have asked her to tell us if that happens again after treatment for bronchitis. I think this is just related to bronchitis and being on aspirin and Plavix.  I counseled her at length today on the need to quit smoking she was provided with smoking cessation resources.  For now I think it's reasonable to continue taking Esbriet at the one pill 3 times a day dosing. We'll need to get liver function testing this year. We'll also need to repeat the pulmonary function tests in 6 minute walk in about a month after she recovers from bronchitis.   Plan: Cigarette smoking: Stop smoking right away Review the list of resources available in our community to help quit smoking that we gave you  Acute bronchitis: Prednisone 20 mg 5 days C pack Chest x-ray  Idiopathic pulmonary fibrosis: Lung function test in 6 minute walk in one month Continue Esbriet Plan repeat liver function test in January or February of this year  COPD:  Quit smoking Flu shot Symbicort 2 puffs twice a day Repeat lung function test  We will see you back in one month with a nurse practitioner that he'll see me again in February or January    Current Outpatient Prescriptions:  .  ACCU-CHEK SOFTCLIX LANCETS lancets, Check blood sugar once daily. Dx:E11.9, Disp: 100 each, Rfl: 5 .  alendronate (FOSAMAX) 70 MG tablet, TAKE 1 TABLET BY MOUTH ONCE A WEEK. TAKE WITH A FULL  GLASS OF WATER ON AN EMPTY  STOMACH., Disp: 12 tablet, Rfl: 1 .  aspirin EC 81 MG tablet, Take 1 tablet (81 mg total) by mouth daily., Disp: 90 tablet, Rfl: 0 .  atorvastatin (LIPITOR) 40 MG tablet, TAKE 1 TABLET BY MOUTH   DAILY, Disp: 90 tablet, Rfl: 0 .  Blood Glucose Monitoring Suppl (ACCU-CHEK AVIVA PLUS) W/DEVICE KIT, Check Blood sugar daily. Dx E11.9, Disp: 1 kit, Rfl: 0 .  budesonide-formoterol (SYMBICORT) 160-4.5 MCG/ACT inhaler, Inhale 2 puffs into the lungs 2 (two) times daily., Disp: 3 Inhaler, Rfl: 3 .  calcium carbonate (OS-CAL - DOSED IN MG OF ELEMENTAL CALCIUM) 1250 (500 Ca) MG tablet, Take 1 tablet (500 mg of elemental calcium total) by mouth daily with breakfast., Disp: 90 tablet, Rfl: 3 .  Cholecalciferol (VITAMIN D) 2000 units CAPS, Take 1 capsule (2,000 Units total) by mouth daily., Disp: 90 capsule, Rfl: 3 .  clopidogrel (PLAVIX) 75 MG tablet, TAKE 1 TABLET BY MOUTH  DAILY, Disp: 90 tablet, Rfl: 1 .  glucose blood (ONETOUCH VERIO) test strip, Check blood sugar once daily. Dx:E11.9,  Disp: 50 each, Rfl: 12 .  IRON PO, Take 1 tablet by mouth daily., Disp: , Rfl:  .  losartan (COZAAR) 100 MG tablet, TAKE 1 TABLET BY MOUTH  DAILY, Disp: 90 tablet, Rfl: 0 .  meloxicam (MOBIC) 7.5 MG tablet, TAKE 1 TABLET BY MOUTH  DAILY (AS NEEDED ONLY), Disp: 30 tablet, Rfl: 0 .  metoprolol tartrate (LOPRESSOR) 25 MG tablet, TAKE 1 TABLET(25 MG) BY MOUTH TWICE DAILY, Disp: 180 tablet, Rfl: 0 .  Multiple Vitamins-Minerals (A THRU Z SELECT 50+ ADVANCED) TABS, TAKE 1 TABLET BY MOUTH DAILY, Disp: 220 tablet, Rfl: 1 .  Pirfenidone (ESBRIET) 267 MG CAPS, TAKE 2 CAPSULES BY MOUTH THREE TIMES A DAY (Patient taking differently: TAKE 1 CAPSULE BY MOUTH THREE TIMES A DAY), Disp: 180 capsule, Rfl: 11 .  spironolactone (ALDACTONE) 25 MG tablet, TAKE 1 TABLET BY MOUTH  DAILY, Disp: 90 tablet, Rfl: 0 .  azithromycin (ZITHROMAX) 250 MG tablet, Take 2 today, then 1 daily until gone., Disp: 6 tablet, Rfl: 0 .  benzonatate (TESSALON) 200 MG capsule, Take 1 capsule (200 mg total) by mouth 3 (three) times daily as needed for cough., Disp: 45 capsule, Rfl: 1 .  budesonide-formoterol (SYMBICORT) 160-4.5 MCG/ACT inhaler, Inhale 2 puffs into  the lungs 2 (two) times daily., Disp: 2 Inhaler, Rfl: 0 .  predniSONE (DELTASONE) 20 MG tablet, Take 1 tablet (20 mg total) by mouth daily with breakfast., Disp: 5 tablet, Rfl: 0

## 2017-06-25 NOTE — Patient Instructions (Signed)
Cigarette smoking: Stop smoking right away Review the list of resources available in our community to help quit smoking that we gave you  Acute bronchitis: Prednisone 20 mg 5 days C pack Chest x-ray  Idiopathic pulmonary fibrosis: Lung function test in 6 minute walk in one month Continue Esbriet Plan repeat liver function test in January or February of this year  COPD:  Quit smoking Flu shot Symbicort 2 puffs twice a day Repeat lung function test  We will see you back in one month with a nurse practitioner that he'll see me again in February or January

## 2017-06-26 ENCOUNTER — Telehealth: Payer: Self-pay | Admitting: Pulmonary Disease

## 2017-06-26 NOTE — Telephone Encounter (Signed)
I did not prescribe this

## 2017-06-27 NOTE — Telephone Encounter (Signed)
Called and spoke with pt and she is aware of BQ recs to use the otc delsym.

## 2017-06-27 NOTE — Telephone Encounter (Signed)
Pt called and stated that her insurance will not cover her benzonatate that was sent to her pharmacy.  Pt wanted to see if anything else could be called in for her to help with her cough.  BQ please advise. Thanks

## 2017-06-27 NOTE — Telephone Encounter (Signed)
Delsym OTC 

## 2017-07-01 ENCOUNTER — Other Ambulatory Visit: Payer: Self-pay | Admitting: Family Medicine

## 2017-07-08 ENCOUNTER — Ambulatory Visit (INDEPENDENT_AMBULATORY_CARE_PROVIDER_SITE_OTHER): Payer: Medicare Other | Admitting: Pulmonary Disease

## 2017-07-08 DIAGNOSIS — J84112 Idiopathic pulmonary fibrosis: Secondary | ICD-10-CM | POA: Diagnosis not present

## 2017-07-08 LAB — PULMONARY FUNCTION TEST
DL/VA % pred: 58 %
DL/VA: 2.67 ml/min/mmHg/L
DLCO UNC % PRED: 42 %
DLCO unc: 9.07 ml/min/mmHg
FEF 25-75 PRE: 1.91 L/s
FEF 25-75 Post: 1.08 L/sec
FEF2575-%CHANGE-POST: -43 %
FEF2575-%Pred-Post: 66 %
FEF2575-%Pred-Pre: 118 %
FEV1-%CHANGE-POST: -17 %
FEV1-%PRED-POST: 96 %
FEV1-%PRED-PRE: 116 %
FEV1-POST: 1.63 L
FEV1-PRE: 1.99 L
FEV1FVC-%Change-Post: -14 %
FEV1FVC-%Pred-Pre: 102 %
FEV6-%Change-Post: -5 %
FEV6-%PRED-POST: 108 %
FEV6-%Pred-Pre: 115 %
FEV6-POST: 2.28 L
FEV6-PRE: 2.43 L
FEV6FVC-%PRED-PRE: 104 %
FEV6FVC-%Pred-Post: 104 %
FVC-%CHANGE-POST: -4 %
FVC-%Pred-Post: 108 %
FVC-%Pred-Pre: 112 %
FVC-POST: 2.38 L
FVC-Pre: 2.48 L
POST FEV1/FVC RATIO: 69 %
PRE FEV6/FVC RATIO: 100 %
Post FEV6/FVC ratio: 100 %
Pre FEV1/FVC ratio: 80 %
RV % PRED: 119 %
RV: 2.45 L
TLC % pred: 99 %
TLC: 4.72 L

## 2017-07-08 NOTE — Progress Notes (Signed)
PFT done today by Lindsay Lemons, CMA  

## 2017-07-09 ENCOUNTER — Encounter: Payer: Self-pay | Admitting: Cardiovascular Disease

## 2017-07-09 ENCOUNTER — Ambulatory Visit (INDEPENDENT_AMBULATORY_CARE_PROVIDER_SITE_OTHER): Payer: Medicare Other | Admitting: Cardiovascular Disease

## 2017-07-09 VITALS — BP 134/62 | HR 72 | Ht 62.0 in | Wt 139.2 lb

## 2017-07-09 DIAGNOSIS — I10 Essential (primary) hypertension: Secondary | ICD-10-CM

## 2017-07-09 DIAGNOSIS — I739 Peripheral vascular disease, unspecified: Secondary | ICD-10-CM

## 2017-07-09 DIAGNOSIS — I6523 Occlusion and stenosis of bilateral carotid arteries: Secondary | ICD-10-CM | POA: Diagnosis not present

## 2017-07-09 DIAGNOSIS — I2510 Atherosclerotic heart disease of native coronary artery without angina pectoris: Secondary | ICD-10-CM | POA: Diagnosis not present

## 2017-07-09 DIAGNOSIS — E785 Hyperlipidemia, unspecified: Secondary | ICD-10-CM

## 2017-07-09 NOTE — Assessment & Plan Note (Signed)
History of bilateral carotid bruits with recent carotid Dopplers performed 01/03/16 revealing mild to moderate right and moderate left ICA stenosis. We will repeat carotid Doppler studies.

## 2017-07-09 NOTE — Assessment & Plan Note (Signed)
History of CAD status post myocardial infarction dating back to 1999 when she underwent stenting of her RCA by Dr. Melvern Banker. She has not had a catheterization since. She denies chest pain or shortness of breath.. Her last Myoview performed 11/02/14 was low risk and nonischemic.

## 2017-07-09 NOTE — Patient Instructions (Signed)
Medication Instructions: Your physician recommends that you continue on your current medications as directed. Please refer to the Current Medication list given to you today.   Testing/Procedures: Your physician has requested that you have a lower extremity arterial duplex. During this test, ultrasound is used to evaluate arterial blood flow in the legs. Allow one hour for this exam. There are no restrictions or special instructions.  Your physician has requested that you have an ankle brachial index (ABI). During this test an ultrasound and blood pressure cuff are used to evaluate the arteries that supply the arms and legs with blood. Allow thirty minutes for this exam. There are no restrictions or special instructions.  Your physician has requested that you have a carotid duplex. This test is an ultrasound of the carotid arteries in your neck. It looks at blood flow through these arteries that supply the brain with blood. Allow one hour for this exam. There are no restrictions or special instructions.   Follow-Up: Your physician wants you to follow-up in: 1 year with Dr. Berry. You will receive a reminder letter in the mail two months in advance. If you don't receive a letter, please call our office to schedule the follow-up appointment.  If you need a refill on your cardiac medications before your next appointment, please call your pharmacy.  

## 2017-07-09 NOTE — Assessment & Plan Note (Addendum)
History of dyslipidemia on statin therapy with recent lipid profile performed 10/15/16 revealed total cholesterol of 155, LDL 82 and HDL of 59.

## 2017-07-09 NOTE — Progress Notes (Signed)
07/09/2017 Christine Meyer   Oct 11, 1950  [id number]  Primary Physician Ann Held, DO Primary Cardiologist: Lorretta Harp MD [name], [place]  HPI:  Christine Meyer is a 66 y.o. female thin appearing single African-American female mother of 4 children . She was referred by Dr. Percival Spanish , her cardiologist, for peripheral vascular evaluation. I last saw her in the office 07/04/16.She has a history of myocardial infarction back in 1999 undergoing stenting of her RCA by Dr. Melvern Banker.her chronic risk factors are notable for continued tobacco abuse, treated hypertension and hyperlipidemia. She is complained of increasing dyspnea on exertion over the last 6 months as well as right calf claudication. Dr. Percival Spanish saw her and ordered a stress test. 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 She has had right renal artery stenting in the past as well as right external iliac artery stenting as well. 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 I stented both of these areas. She did have an occluded left SFA with high-grade segmental diffuse right SFA stenosis. Her Dopplers and symptoms improved. Since I saw her a year ago she's remained currently stable. She continues to deny claudication. She is trying to stop smoking and currently smokes 2 cigarettes a day. Her most recent lipid profile performed 10/15/16 revealed a total cholesterol of 155 , LDL 82 and HDL of 59.   Current Meds  Medication Sig  . ACCU-CHEK SOFTCLIX LANCETS lancets Check blood sugar once daily. Dx:E11.9  . alendronate (FOSAMAX) 70 MG tablet TAKE 1 TABLET BY MOUTH ONCE A WEEK. TAKE WITH A FULL  GLASS OF WATER ON AN EMPTY  STOMACH.  Marland Kitchen aspirin EC 81 MG tablet Take 1 tablet (81 mg total) by mouth daily.  Marland Kitchen  atorvastatin (LIPITOR) 40 MG tablet TAKE 1 TABLET BY MOUTH  DAILY  . azithromycin (ZITHROMAX) 250 MG tablet Take 2 today, then 1 daily until gone.  . benzonatate (TESSALON) 200 MG capsule Take 1 capsule (200 mg total) by mouth 3 (three) times daily as needed for cough.  . Blood Glucose Monitoring Suppl (ACCU-CHEK AVIVA PLUS) W/DEVICE KIT Check Blood sugar daily. Dx E11.9  . budesonide-formoterol (SYMBICORT) 160-4.5 MCG/ACT inhaler Inhale 2 puffs into the lungs 2 (two) times daily.  . budesonide-formoterol (SYMBICORT) 160-4.5 MCG/ACT inhaler Inhale 2 puffs into the lungs 2 (two) times daily.  . calcium carbonate (OS-CAL - DOSED IN MG OF ELEMENTAL CALCIUM) 1250 (500 Ca) MG tablet Take 1 tablet (500 mg of elemental calcium total) by mouth daily with breakfast.  . Cholecalciferol (VITAMIN D) 2000 units CAPS Take 1 capsule (2,000 Units total) by mouth daily.  . clopidogrel (PLAVIX) 75 MG tablet TAKE 1 TABLET BY MOUTH  DAILY  . glucose blood (ONETOUCH VERIO) test strip Check blood sugar once daily. Dx:E11.9  . IRON PO Take 1 tablet by mouth daily.  Marland Kitchen losartan (COZAAR) 100 MG tablet TAKE 1 TABLET BY MOUTH  DAILY  . losartan (COZAAR) 100 MG tablet TAKE 1 TABLET(100 MG) BY MOUTH DAILY  . meloxicam (MOBIC) 7.5 MG tablet TAKE 1 TABLET BY MOUTH  DAILY (AS NEEDED ONLY)  . metoprolol tartrate (LOPRESSOR) 25 MG tablet TAKE 1 TABLET(25 MG) BY MOUTH TWICE DAILY  . Multiple Vitamins-Minerals (A THRU Z SELECT 50+ ADVANCED) TABS TAKE 1 TABLET BY MOUTH DAILY  .  Pirfenidone (ESBRIET) 267 MG CAPS TAKE 2 CAPSULES BY MOUTH THREE TIMES A DAY (Patient taking differently: TAKE 1 CAPSULE BY MOUTH THREE TIMES A DAY)  . predniSONE (DELTASONE) 20 MG tablet Take 1 tablet (20 mg total) by mouth daily with breakfast.  . spironolactone (ALDACTONE) 25 MG tablet TAKE 1 TABLET BY MOUTH  DAILY     No Known Allergies  Social History   Social History  . Marital status: Single    Spouse name: N/A  . Number of children: 4  . Years  of education: N/A   Occupational History  . Not on file.   Social History Main Topics  . Smoking status: Current Every Day Smoker    Packs/day: 0.60    Years: 51.00    Types: Cigarettes  . Smokeless tobacco: Never Used     Comment: down to 5-6 cigarettes daily  . Alcohol use No  . Drug use: No  . Sexual activity: Yes   Other Topics Concern  . Not on file   Social History Narrative   Lives at home with grandson.      Review of Systems: General: negative for chills, fever, night sweats or weight changes.  Cardiovascular: negative for chest pain, dyspnea on exertion, edema, orthopnea, palpitations, paroxysmal nocturnal dyspnea or shortness of breath Dermatological: negative for rash Respiratory: negative for cough or wheezing Urologic: negative for hematuria Abdominal: negative for nausea, vomiting, diarrhea, bright red blood per rectum, melena, or hematemesis Neurologic: negative for visual changes, syncope, or dizziness All other systems reviewed and are otherwise negative except as noted above.    Blood pressure 134/62, pulse 72, height 5' 2"  (1.575 m), weight 139 lb 3.2 oz (63.1 kg).  General appearance: alert and no distress Neck: no adenopathy, no JVD, supple, symmetrical, trachea midline and thyroid not enlarged, symmetric, no tenderness/mass/nodules Lungs: clear to auscultation bilaterally Heart: regular rate and rhythm, S1, S2 normal, no murmur, click, rub or gallop Extremities: extremities normal, atraumatic, no cyanosis or edema Pulses: 2+ and symmetric Skin: Skin color, texture, turgor normal. No rashes or lesions Neurologic: Alert and oriented X 3, normal strength and tone. Normal symmetric reflexes. Normal coordination and gait  EKG sinus rhythm at 72 with a ST or T-wave changes. I personally reviewed his EKG.  ASSESSMENT AND PLAN:   NONDEPENDENT TOBACCO USE DISORDER History of continued tobacco abuse of 2 cigarettes a day she primarily smokes while she  drinks coffee. She is interested in stopping.  Essential hypertension History of essential hypertension blood pressure measured today at 134/62. She is on losartan and metoprolol. Current meds at current dosing  CAROTID BRUITS, BILATERAL History of bilateral carotid bruits with recent carotid Dopplers performed 01/03/16 revealing mild to moderate right and moderate left ICA stenosis. We will repeat carotid Doppler studies.  Coronary atherosclerosis History of CAD status post myocardial infarction dating back to 1999 when she underwent stenting of her RCA by Dr. Melvern Banker. She has not had a catheterization since. She denies chest pain or shortness of breath.. Her last Myoview performed 11/02/14 was low risk and nonischemic.  Dyslipidemia History of dyslipidemia on statin therapy with recent lipid profile performed 10/15/16 revealed total cholesterol of 155, LDL 82 and HDL of 59.  Peripheral arterial disease History of peripheral arterial disease status post stenting of her ostial right common iliac artery as well as her right external iliac artery for "in-stent restenosis. She does have an occluded left SFA as well as a history of right renal artery stenting. She  denies claudication.      Lorretta Harp MD FACP,FACC,FAHA, [facility] 07/09/2017 11:45 AM

## 2017-07-09 NOTE — Assessment & Plan Note (Signed)
History of continued tobacco abuse of 2 cigarettes a day she primarily smokes while she drinks coffee. She is interested in stopping.

## 2017-07-09 NOTE — Assessment & Plan Note (Signed)
History of peripheral arterial disease status post stenting of her ostial right common iliac artery as well as her right external iliac artery for "in-stent restenosis. She does have an occluded left SFA as well as a history of right renal artery stenting. She denies claudication.

## 2017-07-09 NOTE — Assessment & Plan Note (Signed)
History of essential hypertension blood pressure measured today at 134/62. She is on losartan and metoprolol. Current meds at current dosing

## 2017-07-26 ENCOUNTER — Ambulatory Visit (INDEPENDENT_AMBULATORY_CARE_PROVIDER_SITE_OTHER): Payer: Medicare Other | Admitting: Adult Health

## 2017-07-26 ENCOUNTER — Ambulatory Visit (INDEPENDENT_AMBULATORY_CARE_PROVIDER_SITE_OTHER): Payer: Medicare Other | Admitting: *Deleted

## 2017-07-26 ENCOUNTER — Encounter: Payer: Self-pay | Admitting: Adult Health

## 2017-07-26 DIAGNOSIS — J438 Other emphysema: Secondary | ICD-10-CM

## 2017-07-26 DIAGNOSIS — J449 Chronic obstructive pulmonary disease, unspecified: Secondary | ICD-10-CM | POA: Insufficient documentation

## 2017-07-26 DIAGNOSIS — R0609 Other forms of dyspnea: Secondary | ICD-10-CM

## 2017-07-26 DIAGNOSIS — J8410 Pulmonary fibrosis, unspecified: Secondary | ICD-10-CM | POA: Diagnosis not present

## 2017-07-26 NOTE — Patient Instructions (Signed)
Continue on current regimen  Work on not smoking .  Follow up with Dr. Lake Bells in 4 months and and As needed

## 2017-07-26 NOTE — Progress Notes (Signed)
@Patient  ID: Christine Meyer, female    DOB: Jan 15, 1951, 66 y.o.   MRN: [id number]  Chief Complaint  Patient presents with  . Follow-up    IPF     Referring provider: Ann Held, *  HPI: 66 year old female, active smoker, followed for IPF- and emphysema  Labs: 10/2014 ANA, Aldolase, CCP, SCL 70, SSA/SSB, Jo-1 all negative; however ESR 40, RF 17 (both elevated) August 2018 liver function testing normal  PFT: 11/2014 PFT> Ratio 73%, FEV1 1.71L (98% pred), TLC 4.16L (87% pred), DLCO 8.31 (38% pred) October 2016 pulmonary function testing ratio 83%, FEV1 2.00 L (113% predicted), FVC 2.41 L (106% predicted), total lung capacity 3.82 L (80% predicted) C, DLCO 9.51 (44% predicted).    07/26/2017 Follow up : IPF/smoker  Patient presents for a one-month follow-up.  Patient has known COPD with emphysema and IPF. Patient is on Esbriet 1 pill Three times a day  .  She does continue to smoke. Patient was having acute bronchitis last visit.  She was treated with a Z-Pak.  And short course of prednisone.  Patient is feeling better. Patient had 6-minute walk today.  Patient had no desaturations with O2 saturation at 98-100% on room air.  288 m completed.  2016 6-minute walk test 333 m completed with no desaturations. PFT done on July 08, 2017 showed no significant change from 2016.  FEV1 116%, ratio 80, FVC 112%, TLC 99%, DLCO 42%.  1 2016 PFT showed DLCO 44%. Patient was encouraged on smoking cessation.. She remains on Symbicort twice daily. Patient had low-dose CT chest screening October 2018 that was RADS 1.  Stable fibrosis. She says overall she is doing okay . Does have some dry cough intermittent. Gets winded with prolonged walking .   Works par-time for [employer]. Work in Immunologist.     No Known Allergies  Immunization History  Administered Date(s) Administered  . Influenza, High Dose Seasonal PF 06/25/2017  . Pneumococcal Conjugate-13 09/06/2014  . Pneumococcal  Polysaccharide-23 09/08/2015  . Td 11/09/2003    Past Medical History:  Diagnosis Date  . Anemia    NOS  . CAD (coronary artery disease)    Stent RCA 1999  . COPD (chronic obstructive pulmonary disease) ([place])   . Diabetes mellitus, type 2 (HCC)    Diet controlled  . Hyperlipidemia   . Hypertension   . OSA on CPAP   . PVD (peripheral vascular disease) (HCC)    Carotid stenosis, renal artery stenosis  . Sleep apnea    dx'd but doesn't use mask   . Uterine cancer ([place]) ?1980's    Tobacco History: Social History   Tobacco Use  Smoking Status Current Every Day Smoker  . Packs/day: 0.60  . Years: 51.00  . Pack years: 30.60  . Types: Cigarettes  Smokeless Tobacco Never Used  Tobacco Comment   down to 5-6 cigarettes daily   Ready to quit: No Counseling given: Yes Comment: down to 5-6 cigarettes daily   Outpatient Encounter Medications as of 07/26/2017  Medication Sig  . ACCU-CHEK SOFTCLIX LANCETS lancets Check blood sugar once daily. Dx:E11.9  . alendronate (FOSAMAX) 70 MG tablet TAKE 1 TABLET BY MOUTH ONCE A WEEK. TAKE WITH A FULL  GLASS OF WATER ON AN EMPTY  STOMACH.  Marland Kitchen aspirin EC 81 MG tablet Take 1 tablet (81 mg total) by mouth daily.  Marland Kitchen atorvastatin (LIPITOR) 40 MG tablet TAKE 1 TABLET BY MOUTH  DAILY  . Blood Glucose Monitoring Suppl (ACCU-CHEK AVIVA  PLUS) W/DEVICE KIT Check Blood sugar daily. Dx E11.9  . budesonide-formoterol (SYMBICORT) 160-4.5 MCG/ACT inhaler Inhale 2 puffs into the lungs 2 (two) times daily.  . calcium carbonate (OS-CAL - DOSED IN MG OF ELEMENTAL CALCIUM) 1250 (500 Ca) MG tablet Take 1 tablet (500 mg of elemental calcium total) by mouth daily with breakfast.  . Cholecalciferol (VITAMIN D) 2000 units CAPS Take 1 capsule (2,000 Units total) by mouth daily.  . clopidogrel (PLAVIX) 75 MG tablet TAKE 1 TABLET BY MOUTH  DAILY  . glucose blood (ONETOUCH VERIO) test strip Check blood sugar once daily. Dx:E11.9  . IRON PO Take 1 tablet by mouth daily.    Marland Kitchen losartan (COZAAR) 100 MG tablet TAKE 1 TABLET BY MOUTH  DAILY  . meloxicam (MOBIC) 7.5 MG tablet TAKE 1 TABLET BY MOUTH  DAILY (AS NEEDED ONLY)  . metoprolol tartrate (LOPRESSOR) 25 MG tablet TAKE 1 TABLET(25 MG) BY MOUTH TWICE DAILY  . Multiple Vitamins-Minerals (A THRU Z SELECT 50+ ADVANCED) TABS TAKE 1 TABLET BY MOUTH DAILY  . Pirfenidone (ESBRIET) 267 MG CAPS TAKE 2 CAPSULES BY MOUTH THREE TIMES A DAY (Patient taking differently: TAKE 1 CAPSULE BY MOUTH THREE TIMES A DAY)  . predniSONE (DELTASONE) 20 MG tablet Take 1 tablet (20 mg total) by mouth daily with breakfast.  . spironolactone (ALDACTONE) 25 MG tablet TAKE 1 TABLET BY MOUTH  DAILY  . benzonatate (TESSALON) 200 MG capsule Take 1 capsule (200 mg total) by mouth 3 (three) times daily as needed for cough. (Patient not taking: Reported on 07/26/2017)  . losartan (COZAAR) 100 MG tablet TAKE 1 TABLET(100 MG) BY MOUTH DAILY  . [DISCONTINUED] azithromycin (ZITHROMAX) 250 MG tablet Take 2 today, then 1 daily until gone. (Patient not taking: Reported on 07/26/2017)  . [DISCONTINUED] budesonide-formoterol (SYMBICORT) 160-4.5 MCG/ACT inhaler Inhale 2 puffs into the lungs 2 (two) times daily. (Patient not taking: Reported on 07/26/2017)   No facility-administered encounter medications on file as of 07/26/2017.      Review of Systems  Constitutional:   No  weight loss, night sweats,  Fevers, chills,  +fatigue, or  lassitude.  HEENT:   No headaches,  Difficulty swallowing,  Tooth/dental problems, or  Sore throat,                No sneezing, itching, ear ache, nasal congestion, post nasal drip,   CV:  No chest pain,  Orthopnea, PND, swelling in lower extremities, anasarca, dizziness, palpitations, syncope.   GI  No heartburn, indigestion, abdominal pain, nausea, vomiting, diarrhea, change in bowel habits, loss of appetite, bloody stools.   Resp:   No excess mucus, no productive cough,  No non-productive cough,  No coughing up of blood.   No change in color of mucus.  No wheezing.  No chest wall deformity  Skin: no rash or lesions.  GU: no dysuria, change in color of urine, no urgency or frequency.  No flank pain, no hematuria   MS:  No joint pain or swelling.  No decreased range of motion.  No back pain.    Physical Exam  BP 134/60 (BP Location: Left Arm, Cuff Size: Normal)   Pulse 80   Ht 5' 1"  (1.549 m)   Wt 139 lb 9.6 oz (63.3 kg)   SpO2 100%   BMI 26.38 kg/m   GEN: A/Ox3; pleasant , NAD, well nourished    HEENT:  [place]/AT,  EACs-clear, TMs-wnl, NOSE-clear, THROAT-clear, no lesions, no postnasal drip or exudate noted.   NECK:  Supple w/ fair ROM; no JVD; normal carotid impulses w/o bruits; no thyromegaly or nodules palpated; no lymphadenopathy.    RESP  Clear  P & A; w/o, wheezes/ rales/ or rhonchi. no accessory muscle use, no dullness to percussion  CARD:  RRR, no m/r/g, no peripheral edema, pulses intact, no cyanosis or clubbing.  GI:   Soft & nt; nml bowel sounds; no organomegaly or masses detected.   Musco: Warm bil, no deformities or joint swelling noted.   Neuro: alert, no focal deficits noted.    Skin: Warm, no lesions or rashes    Lab Results:  CBC  BNP No results found for: BNP  ProBNP No results found for: PROBNP  Imaging: No results found.   Assessment & Plan:   Usual interstitial pneumonitis Stable IPF despite ongoing smoking  Smoking cessation encouraged.  Cont on Esbriet  LFT in 4 month   Plan  Patient Instructions  Continue on current regimen  Work on not smoking .  Follow up with Dr. [place] Bells in 4 months and and As needed       COPD (chronic obstructive pulmonary disease) ([place]) Stable wtihout flare  No significant obstruction on PFT   Plan  Patient Instructions  Continue on current regimen  Work on not smoking .  Follow up with Dr. [place] Bells in 4 months and and As needed          Rexene Edison, NP 07/26/2017

## 2017-07-26 NOTE — Assessment & Plan Note (Signed)
Stable wtihout flare  No significant obstruction on PFT   Plan  Patient Instructions  Continue on current regimen  Work on not smoking .  Follow up with Dr. [place] Bells in 4 months and and As needed

## 2017-07-26 NOTE — Assessment & Plan Note (Signed)
Stable IPF despite ongoing smoking  Smoking cessation encouraged.  Cont on Esbriet  LFT in 4 month   Plan  Patient Instructions  Continue on current regimen  Work on not smoking .  Follow up with Christine Meyer in 4 months and and As needed

## 2017-07-26 NOTE — Progress Notes (Signed)
SIX MIN WALK 07/26/2017 07/07/2015 10/19/2014  Medications Symbicort 160 taken at 8:00 Albuterol 4 puffs taken approx 11:45 for PFT -  Supplimental Oxygen during Test? (L/min) No No No  Laps 6 6 -  Partial Lap (in Meters) 0 45 -  Baseline BP (sitting) 146/62 138/70 -  Baseline Heartrate 87 73 -  Baseline Dyspnea (Borg Scale) 0 0 -  Baseline Fatigue (Borg Scale) 2 0 -  Baseline SPO2 100 98 -  BP (sitting) 168/78 148/84 -  Heartrate 99 89 -  Dyspnea (Borg Scale) 0 0 -  Fatigue (Borg Scale) 2 0 -  SPO2 98 98 -  BP (sitting) 160/70 138/76 -  Heartrate 89 74 -  SPO2 100 100 -  Stopped or Paused before Six Minutes No No -  Distance Completed 288 333 -  Tech Comments: pt completed test at slow pace with no complaints or desats.  pt tolerated walk well.  pt walked a slow pace, tolerated walk well.

## 2017-07-29 ENCOUNTER — Telehealth: Payer: Self-pay | Admitting: Family Medicine

## 2017-07-29 ENCOUNTER — Other Ambulatory Visit: Payer: Self-pay | Admitting: Family Medicine

## 2017-07-29 MED ORDER — OLMESARTAN MEDOXOMIL 20 MG PO TABS
20.0000 mg | ORAL_TABLET | Freq: Every day | ORAL | 0 refills | Status: DC
Start: 2017-07-29 — End: 2017-09-02

## 2017-07-29 NOTE — Telephone Encounter (Signed)
D/c the losartan and sent in #30 of benicar to her local pharmacy. Patients daughter has been informed of information/instructions.

## 2017-07-29 NOTE — Telephone Encounter (Signed)
Patients daughter has checked with her mother's pharmacy and the losartan she was on is one on the recall list. She has not taken now for 5 days so needs an alternative sent in.  Please send in an alternative---Please send to local pharmacy---she is not using [facility]--send to [facility] in [place]

## 2017-07-29 NOTE — Addendum Note (Signed)
Addended by: Sharon Seller B on: 07/29/2017 12:06 PM   Modules accepted: Orders

## 2017-07-29 NOTE — Progress Notes (Signed)
Reviewed, agree 

## 2017-07-29 NOTE — Telephone Encounter (Signed)
D/c losartan benicar 20 mg #30  1 po qd bp check 2-3 weeks

## 2017-07-30 ENCOUNTER — Ambulatory Visit: Payer: Medicare Other | Admitting: Podiatry

## 2017-07-30 ENCOUNTER — Ambulatory Visit (INDEPENDENT_AMBULATORY_CARE_PROVIDER_SITE_OTHER): Payer: Medicare Other | Admitting: Podiatry

## 2017-07-30 DIAGNOSIS — B351 Tinea unguium: Secondary | ICD-10-CM | POA: Diagnosis not present

## 2017-07-30 DIAGNOSIS — M79675 Pain in left toe(s): Secondary | ICD-10-CM

## 2017-07-30 DIAGNOSIS — M79674 Pain in right toe(s): Secondary | ICD-10-CM

## 2017-07-30 DIAGNOSIS — E1151 Type 2 diabetes mellitus with diabetic peripheral angiopathy without gangrene: Secondary | ICD-10-CM | POA: Diagnosis not present

## 2017-07-30 NOTE — Progress Notes (Signed)
Subjective: 66 y.o. returns the office today for painful, elongated, thickened toenails which she cannot trim herself. Denies any redness or drainage around the nails. Also presents for foot evaluation. Denies any acute changes since last appointment and no new complaints today. Denies any systemic complaints such as fevers, chills, nausea, vomiting.   Objective: AAO 3, NAD DP/PT pulses decreased, unchanged  Nails hypertrophic, dystrophic, elongated, brittle, discolored 10.Nails have yellow to brown discoloration. Thre is incurvation along multiple nails mostly bilateral hallux toenails. There is tenderness overlying the nails 1-5 bilaterally. There is no surrounding erythema or drainage along the nail sites. Incurvation of hallux toenails. No signs of infection.  No open lesions or pre-ulcerative lesions are identified. No other areas of tenderness bilateral lower extremities. No overlying edema, erythema, increased warmth. No pain with calf compression, swelling, warmth, erythema.  Assessment: Patient presents with symptomatic onychomycosis  Plan: -Treatment options including alternatives, risks, complications were discussed -Nails sharply debrided 10 without complication/bleeding. -Discussed daily foot inspection. If there are any changes, to call the office immediately.  -Follow-up in 9 weeks or sooner if any problems are to arise. In the meantime, encouraged to call the office with any questions, concerns, changes symptoms.  Celesta Gentile, DPM

## 2017-08-06 ENCOUNTER — Ambulatory Visit (HOSPITAL_COMMUNITY)
Admission: RE | Admit: 2017-08-06 | Discharge: 2017-08-06 | Disposition: A | Discharge status: Home / Self Care | Payer: Medicare Other | Source: Ambulatory Visit | Attending: Cardiology | Admitting: Cardiology

## 2017-08-06 DIAGNOSIS — F17200 Nicotine dependence, unspecified, uncomplicated: Secondary | ICD-10-CM | POA: Insufficient documentation

## 2017-08-06 DIAGNOSIS — I10 Essential (primary) hypertension: Secondary | ICD-10-CM | POA: Diagnosis not present

## 2017-08-06 DIAGNOSIS — E785 Hyperlipidemia, unspecified: Secondary | ICD-10-CM | POA: Diagnosis not present

## 2017-08-06 DIAGNOSIS — I6523 Occlusion and stenosis of bilateral carotid arteries: Secondary | ICD-10-CM | POA: Diagnosis not present

## 2017-08-12 ENCOUNTER — Other Ambulatory Visit: Payer: Self-pay | Admitting: Pulmonary Disease

## 2017-08-12 ENCOUNTER — Other Ambulatory Visit: Payer: Self-pay | Admitting: Family Medicine

## 2017-08-12 DIAGNOSIS — I10 Essential (primary) hypertension: Secondary | ICD-10-CM

## 2017-08-13 ENCOUNTER — Ambulatory Visit (HOSPITAL_COMMUNITY)
Admission: RE | Admit: 2017-08-13 | Discharge: 2017-08-13 | Disposition: A | Discharge status: Home / Self Care | Payer: Medicare Other | Source: Ambulatory Visit | Attending: Cardiology | Admitting: Cardiology

## 2017-08-13 ENCOUNTER — Other Ambulatory Visit: Payer: Self-pay | Admitting: Family Medicine

## 2017-08-13 DIAGNOSIS — F17200 Nicotine dependence, unspecified, uncomplicated: Secondary | ICD-10-CM | POA: Diagnosis not present

## 2017-08-13 DIAGNOSIS — Z720 Tobacco use: Secondary | ICD-10-CM | POA: Insufficient documentation

## 2017-08-13 DIAGNOSIS — I10 Essential (primary) hypertension: Secondary | ICD-10-CM | POA: Insufficient documentation

## 2017-08-13 DIAGNOSIS — I739 Peripheral vascular disease, unspecified: Secondary | ICD-10-CM

## 2017-08-13 DIAGNOSIS — E785 Hyperlipidemia, unspecified: Secondary | ICD-10-CM | POA: Diagnosis not present

## 2017-08-13 DIAGNOSIS — E119 Type 2 diabetes mellitus without complications: Secondary | ICD-10-CM | POA: Insufficient documentation

## 2017-08-13 DIAGNOSIS — E1151 Type 2 diabetes mellitus with diabetic peripheral angiopathy without gangrene: Secondary | ICD-10-CM | POA: Diagnosis not present

## 2017-08-13 DIAGNOSIS — I708 Atherosclerosis of other arteries: Secondary | ICD-10-CM | POA: Insufficient documentation

## 2017-08-16 ENCOUNTER — Other Ambulatory Visit: Payer: Self-pay | Admitting: Cardiovascular Disease

## 2017-08-16 DIAGNOSIS — I739 Peripheral vascular disease, unspecified: Secondary | ICD-10-CM

## 2017-08-26 ENCOUNTER — Telehealth: Payer: Self-pay | Admitting: Pulmonary Disease

## 2017-08-26 MED ORDER — DOXYCYCLINE HYCLATE 100 MG PO TABS: 100.0000 mg | ORAL_TABLET | Freq: Two times a day (BID) | ORAL | 0 refills | Status: DC

## 2017-08-26 MED ORDER — PREDNISONE 10 MG PO TABS: ORAL_TABLET | ORAL | 0 refills | Status: DC

## 2017-08-26 NOTE — Telephone Encounter (Signed)
Might be having IPF flare  Plan Please take Take prednisone 40mg  once daily x 3 days, then 30mg  once daily x 3 days, then 20mg  once daily x 3 days, then prednisone 10mg  once daily  x 3 days and stop   Take doxycycline 100mg  po twice daily x 5 days; take after meals and avoid sunlight  If worse go to ER  Dr. Beeck Males, M.D., [facility].C.P Pulmonary and Critical Care Medicine Staff Physician, [place] Director - Interstitial Lung Disease  Program  Pulmonary [place] at [place], [place], [address]  Pager: [phone], If no answer or between  15:00h - 7:00h: call [phone] Telephone: [phone]

## 2017-08-26 NOTE — Telephone Encounter (Signed)
Spoke with pt's daughter (dpr on file), aware of recs.  rx's sent to preferred pharmacy.  Nothing further needed.

## 2017-08-26 NOTE — Telephone Encounter (Signed)
I will close encounter because there is no message.

## 2017-08-26 NOTE — Telephone Encounter (Signed)
Spoke with pt's daughter, [name]. States that pt is still having issues with a cough and wheezing. Cough is producing thick, white mucus. Denies chest tightness, SOB or fever. [name] is wanting recommendations. Will route to DOD as BQ is not available today.  MR - please advise. Thanks.

## 2017-09-02 ENCOUNTER — Other Ambulatory Visit: Payer: Self-pay | Admitting: Family Medicine

## 2017-09-11 ENCOUNTER — Telehealth: Payer: Self-pay | Admitting: Pulmonary Disease

## 2017-09-11 NOTE — Telephone Encounter (Signed)
PA request for Esbriet received by  [facility] Initiated via [email or web address]- Key: [id number] PA has been approved through 09/09/2018.  Nothing further needed.

## 2017-09-20 ENCOUNTER — Other Ambulatory Visit: Payer: Self-pay | Admitting: Family Medicine

## 2017-10-07 ENCOUNTER — Other Ambulatory Visit: Payer: Self-pay | Admitting: Family Medicine

## 2017-10-15 ENCOUNTER — Telehealth: Payer: Self-pay | Admitting: Family Medicine

## 2017-10-15 DIAGNOSIS — Z1239 Encounter for other screening for malignant neoplasm of breast: Secondary | ICD-10-CM

## 2017-10-15 DIAGNOSIS — E119 Type 2 diabetes mellitus without complications: Secondary | ICD-10-CM

## 2017-10-15 NOTE — Telephone Encounter (Signed)
Both referrals entered as family member requested.

## 2017-10-15 NOTE — Telephone Encounter (Signed)
Patients daughter requesting a new referral for a new podiatrist (current one has retired). ALSO needs a mammogram referral  --- PLEASE note on referral patient can do only Thursday and Friday for appointments due to work> Thanks

## 2017-10-15 NOTE — Telephone Encounter (Signed)
Ok to put referral in 

## 2017-10-16 ENCOUNTER — Other Ambulatory Visit: Payer: Self-pay | Admitting: Cardiology

## 2017-10-17 NOTE — Telephone Encounter (Signed)
Rx has been sent to the pharmacy electronically. ° °

## 2017-10-24 ENCOUNTER — Ambulatory Visit (INDEPENDENT_AMBULATORY_CARE_PROVIDER_SITE_OTHER): Payer: Medicare Other | Admitting: Pulmonary Disease

## 2017-10-24 ENCOUNTER — Telehealth: Payer: Self-pay | Admitting: Pulmonary Disease

## 2017-10-24 ENCOUNTER — Encounter: Payer: Self-pay | Admitting: Pulmonary Disease

## 2017-10-24 ENCOUNTER — Other Ambulatory Visit (INDEPENDENT_AMBULATORY_CARE_PROVIDER_SITE_OTHER): Payer: Medicare Other

## 2017-10-24 VITALS — BP 144/78 | HR 91 | Ht 62.0 in | Wt 141.0 lb

## 2017-10-24 DIAGNOSIS — J8410 Pulmonary fibrosis, unspecified: Secondary | ICD-10-CM | POA: Diagnosis not present

## 2017-10-24 DIAGNOSIS — J84112 Idiopathic pulmonary fibrosis: Secondary | ICD-10-CM

## 2017-10-24 DIAGNOSIS — F17210 Nicotine dependence, cigarettes, uncomplicated: Secondary | ICD-10-CM

## 2017-10-24 DIAGNOSIS — J438 Other emphysema: Secondary | ICD-10-CM

## 2017-10-24 LAB — HEPATIC FUNCTION PANEL
ALBUMIN: 3.9 g/dL (ref 3.5–5.2)
ALK PHOS: 63 U/L (ref 39–117)
ALT: 21 U/L (ref 0–35)
AST: 21 U/L (ref 0–37)
BILIRUBIN TOTAL: 0.3 mg/dL (ref 0.2–1.2)
Bilirubin, Direct: 0.1 mg/dL (ref 0.0–0.3)
Total Protein: 7 g/dL (ref 6.0–8.3)

## 2017-10-24 NOTE — Progress Notes (Signed)
Subjective:    Patient ID: Christine Meyer, female    DOB: 10-09-1950, 67 y.o.   MRN: [id number]  Synopsis: [name] was referred to the [facility] in 2016 for evaluation of shortness of breath. She had a CT chest performed which showed evidence of usual interstitial pneumonitis as well as centrilobular emphysema. She has a significant smoking history.  April 2016 rheumatology visit (Dr. Trudie Reed no evidence of connective tissue disease    HPI Chief Complaint  Patient presents with  . Follow-up    3 month ROV, continues to have producitve cough (white)    IPF: > still taking Esbriet, no problems > no upset stomach or diarrhea  COPD: > she still has a cough daily and produces phlegm which is white in color > hasn't been sick this winter > had a flu shot > had a mild flare and needed prednisone > she is still working, doesn't get short of breath at work > only gets short of breath when she is vacuuming a lot  Tobacco abuse: > She is still smoking a few cigarettes a day, says that she talked to the smoking cessation folks at [facility] but never went for any other classes  Past Medical History:  Diagnosis Date  . Anemia    NOS  . CAD (coronary artery disease)    Stent RCA 1999  . COPD (chronic obstructive pulmonary disease) ([place])   . Diabetes mellitus, type 2 (HCC)    Diet controlled  . Hyperlipidemia   . Hypertension   . OSA on CPAP   . PVD (peripheral vascular disease) (HCC)    Carotid stenosis, renal artery stenosis  . Sleep apnea    dx'd but doesn't use mask   . Uterine cancer ([place]) ?1980's      Review of Systems  Constitutional: Negative for chills, fatigue and fever.  HENT: Negative for postnasal drip, rhinorrhea and sinus pressure.   Respiratory: Positive for shortness of breath. Negative for cough and wheezing.   Cardiovascular: Negative for chest pain, palpitations and leg swelling.       Objective:   Physical Exam Vitals:   10/24/17 0918  BP:  (!) 144/78  Pulse: 91  SpO2: 96%  Weight: 141 lb (64 kg)  Height: 5' 2" (1.575 m)   RA  Gen: well appearing HENT: OP clear, TM's clear, neck supple PULM: Coarse crackles bases B, normal percussion CV: RRR, no mgr, trace edema GI: BS+, soft, nontender Derm: no cyanosis or rash Psyche: normal mood and affect   Chest imaging: 12 2015 CT chest> no pulmonary embolism, moderate centrilobular emphysema, there is interstitial septal thickening as well as honeycombing in a patchy and peripheral distribution with a clear craniocaudal gradient. This is consistent with usual interstitial pneumonitis.  August 2018 low-dose lung cancer screening CT: Showed emphysema, lung RA DS score 1, pulmonary fibrosis pattern indicative of nonspecific interstitial pneumonitis. No progression of fibrotic changes noted compared to the last CT.images independently reviewed, emphysema noted in the upper lobes, interlobular septal thickening and honeycombing in a peripheral distribution worse in the bases   Labs: 10/2014 ANA, Aldolase, CCP, SCL 70, SSA/SSB, Jo-1 all negative; however ESR 40, RF 17 (both elevated) August 2018 liver function testing normal  PFT: 11/2014 PFT> Ratio 73%, FEV1 1.71L (98% pred), TLC 4.16L (87% pred), DLCO 8.31 (38% pred) October 2016 pulmonary function testing ratio 83%, FEV1 2.00 L (113% predicted), FVC 2.41 L (106% predicted), total lung capacity 3.82 L (80% predicted) C, DLCO 9.51 (  44% predicted). 06/2017 PFT October 2018 FVC 2.4 L 108% predicted, total lung capacity 4.72 L 99% predicted, DLCO 9.07 42% predicted     Assessment & Plan:   Usual interstitial pneumonitis  Other emphysema (HCC)  IPF (idiopathic pulmonary fibrosis) ([place])  Cigarette smoker  Discussion: This has been a stable interval for Ms. Brands.  She had a mild exacerbation recently tobacco use.  We talked about the importance of quitting smoking at length today.  She has not gone to any smoking cessation classes  though she has considered it.  She is tolerating Esbriet well without any problems.  We need to get liver function testing to make sure she is not having any liver toxicity from the medicine.  She is compliant with Symbicort.  Plan: Idiopathic pulmonary fibrosis: Keep taking Esbriet 3 times a day We will check liver function testing today to make sure there is no problems from the medicine We will get a lung function test and 6-minute walk when you return in May  Chronic obstructive pulmonary disease Continue taking Symbicort 2 puffs twice a day no matter how you feel  Tobacco abuse: I strongly recommend he quit smoking as soon as possible You should consider enrolling in 1 of the smoking cessation classes to help with this  We will see you back in May   Current Outpatient Medications:  .  ACCU-CHEK SOFTCLIX LANCETS lancets, Check blood sugar once daily. Dx:E11.9, Disp: 100 each, Rfl: 5 .  alendronate (FOSAMAX) 70 MG tablet, TAKE 1 TABLET BY MOUTH ONCE A WEEK. TAKE WITH A FULL  GLASS OF WATER ON AN EMPTY  STOMACH., Disp: 12 tablet, Rfl: 1 .  aspirin EC 81 MG tablet, Take 1 tablet (81 mg total) by mouth daily., Disp: 90 tablet, Rfl: 0 .  atorvastatin (LIPITOR) 40 MG tablet, TAKE 1 TABLET BY MOUTH  DAILY, Disp: 90 tablet, Rfl: 0 .  Blood Glucose Monitoring Suppl (ACCU-CHEK AVIVA PLUS) W/DEVICE KIT, Check Blood sugar daily. Dx E11.9, Disp: 1 kit, Rfl: 0 .  calcium carbonate (OS-CAL - DOSED IN MG OF ELEMENTAL CALCIUM) 1250 (500 Ca) MG tablet, Take 1 tablet (500 mg of elemental calcium total) by mouth daily with breakfast., Disp: 90 tablet, Rfl: 3 .  Cholecalciferol (VITAMIN D) 2000 units CAPS, Take 1 capsule (2,000 Units total) by mouth daily., Disp: 90 capsule, Rfl: 3 .  clopidogrel (PLAVIX) 75 MG tablet, TAKE 1 TABLET(75 MG) BY MOUTH DAILY, Disp: 90 tablet, Rfl: 2 .  IRON PO, Take 1 tablet by mouth daily., Disp: , Rfl:  .  meloxicam (MOBIC) 7.5 MG tablet, TAKE 1 TABLET(7.5 MG) BY MOUTH  DAILY, Disp: 30 tablet, Rfl: 0 .  Multiple Vitamins-Minerals (A THRU Z SELECT 50+ ADVANCED) TABS, TAKE 1 TABLET BY MOUTH DAILY, Disp: 220 tablet, Rfl: 1 .  ONETOUCH VERIO test strip, USE TO TEST BLOOD SUGAR ONCE DAILY, Disp: 75 each, Rfl: 0 .  spironolactone (ALDACTONE) 25 MG tablet, TAKE 1 TABLET(25 MG) BY MOUTH DAILY, Disp: 90 tablet, Rfl: 0 .  SYMBICORT 160-4.5 MCG/ACT inhaler, INHALE 2 PUFFS INTO THE LUNGS TWICE DAILY, Disp: 10.2 g, Rfl: 0 .  benzonatate (TESSALON) 200 MG capsule, Take 1 capsule (200 mg total) by mouth 3 (three) times daily as needed for cough. (Patient not taking: Reported on 10/24/2017), Disp: 45 capsule, Rfl: 1 .  metoprolol tartrate (LOPRESSOR) 25 MG tablet, TAKE 1 TABLET BY MOUTH TWICE DAILY (Patient not taking: Reported on 10/24/2017), Disp: 180 tablet, Rfl: 0 .  olmesartan (BENICAR)  20 MG tablet, TAKE 1 TABLET(20 MG) BY MOUTH DAILY (Patient not taking: Reported on 10/24/2017), Disp: 30 tablet, Rfl: 0 .  Pirfenidone (ESBRIET) 267 MG CAPS, TAKE 2 CAPSULES BY MOUTH THREE TIMES A DAY (Patient taking differently: TAKE 1 CAPSULE BY MOUTH THREE TIMES A DAY), Disp: 180 capsule, Rfl: 11

## 2017-10-24 NOTE — Patient Instructions (Signed)
Idiopathic pulmonary fibrosis: Keep taking Esbriet 3 times a day We will check liver function testing today to make sure there is no problems from the medicine We will get a lung function test and 6-minute walk when you return in May  Chronic obstructive pulmonary disease Continue taking Symbicort 2 puffs twice a day no matter how you feel  Tobacco abuse: I strongly recommend he quit smoking as soon as possible You should consider enrolling in 1 of the smoking cessation classes to help with this  We will see you back in May

## 2017-10-24 NOTE — Telephone Encounter (Signed)
Notes recorded by Juanito Doom, MD on 10/24/2017 at 1:25 PM EST A, Please let the patient know this was OK Thanks, B  Advised pt of results. Pt understood and nothing further is needed.

## 2017-10-31 ENCOUNTER — Telehealth: Payer: Self-pay | Admitting: Family Medicine

## 2017-10-31 NOTE — Telephone Encounter (Signed)
Copied from [place] [id number]. Topic: Inquiry >> Oct 31, 2017  4:38 PM Cecelia Byars, [place] wrote: Reason for CRM:  [place] called for  Christine, Meyer - [address] AT [place] [phone] (Phone) [phone] (Fax   called wanting to know if the patients metoprolol tartrate (LOPRESSOR) 25 MG tablet can be changed  metoprolol tosuccinate the same strength .1 time daily ,she is having trouble remembering to take her medication

## 2017-11-01 MED ORDER — METOPROLOL SUCCINATE ER 25 MG PO TB24: 25.0000 mg | ORAL_TABLET | Freq: Every day | ORAL | 2 refills | Status: DC

## 2017-11-01 NOTE — Telephone Encounter (Signed)
rx sent in 

## 2017-11-01 NOTE — Telephone Encounter (Signed)
Ok to change to [employer]

## 2017-11-09 ENCOUNTER — Other Ambulatory Visit: Payer: Self-pay | Admitting: Family Medicine

## 2017-11-09 DIAGNOSIS — I10 Essential (primary) hypertension: Secondary | ICD-10-CM

## 2017-11-14 ENCOUNTER — Other Ambulatory Visit: Payer: Self-pay | Admitting: Family Medicine

## 2017-12-13 ENCOUNTER — Ambulatory Visit (HOSPITAL_BASED_OUTPATIENT_CLINIC_OR_DEPARTMENT_OTHER)
Admission: RE | Admit: 2017-12-13 | Discharge: 2017-12-13 | Disposition: A | Discharge status: Home / Self Care | Payer: Medicare Other | Source: Ambulatory Visit | Attending: Family Medicine | Admitting: Family Medicine

## 2017-12-13 DIAGNOSIS — Z1231 Encounter for screening mammogram for malignant neoplasm of breast: Secondary | ICD-10-CM | POA: Diagnosis not present

## 2017-12-13 DIAGNOSIS — Z1239 Encounter for other screening for malignant neoplasm of breast: Secondary | ICD-10-CM

## 2018-01-08 ENCOUNTER — Other Ambulatory Visit: Payer: Self-pay | Admitting: Pulmonary Disease

## 2018-01-08 DIAGNOSIS — J432 Centrilobular emphysema: Secondary | ICD-10-CM

## 2018-01-09 ENCOUNTER — Ambulatory Visit (INDEPENDENT_AMBULATORY_CARE_PROVIDER_SITE_OTHER): Payer: Medicare Other | Admitting: Pulmonary Disease

## 2018-01-09 DIAGNOSIS — J432 Centrilobular emphysema: Secondary | ICD-10-CM | POA: Diagnosis not present

## 2018-01-09 LAB — PULMONARY FUNCTION TEST
DL/VA % pred: 62 %
DL/VA: 2.85 ml/min/mmHg/L
DLCO UNC % PRED: 40 %
DLCO UNC: 8.63 ml/min/mmHg
FEF 25-75 POST: 1.82 L/s
FEF 25-75 PRE: 1.65 L/s
FEF2575-%Change-Post: 10 %
FEF2575-%PRED-POST: 113 %
FEF2575-%PRED-PRE: 102 %
FEV1-%Change-Post: 3 %
FEV1-%Pred-Post: 113 %
FEV1-%Pred-Pre: 109 %
FEV1-Post: 1.91 L
FEV1-Pre: 1.86 L
FEV1FVC-%Change-Post: 4 %
FEV1FVC-%PRED-PRE: 101 %
FEV6-%CHANGE-POST: 0 %
FEV6-%PRED-POST: 111 %
FEV6-%Pred-Pre: 111 %
FEV6-Post: 2.33 L
FEV6-Pre: 2.32 L
FEV6FVC-%CHANGE-POST: 1 %
FEV6FVC-%Pred-Post: 104 %
FEV6FVC-%Pred-Pre: 102 %
FVC-%Change-Post: -1 %
FVC-%PRED-POST: 106 %
FVC-%Pred-Pre: 107 %
FVC-Post: 2.33 L
FVC-Pre: 2.35 L
PRE FEV1/FVC RATIO: 79 %
Post FEV1/FVC ratio: 82 %
Post FEV6/FVC ratio: 100 %
Pre FEV6/FVC Ratio: 99 %
RV % PRED: 92 %
RV: 1.9 L
TLC % pred: 88 %
TLC: 4.19 L

## 2018-01-09 NOTE — Progress Notes (Signed)
PFT completed 01/09/18

## 2018-01-16 ENCOUNTER — Ambulatory Visit (INDEPENDENT_AMBULATORY_CARE_PROVIDER_SITE_OTHER): Payer: Medicare Other | Admitting: *Deleted

## 2018-01-16 ENCOUNTER — Ambulatory Visit (INDEPENDENT_AMBULATORY_CARE_PROVIDER_SITE_OTHER): Payer: Medicare Other | Admitting: Pulmonary Disease

## 2018-01-16 ENCOUNTER — Ambulatory Visit (INDEPENDENT_AMBULATORY_CARE_PROVIDER_SITE_OTHER)
Admission: RE | Admit: 2018-01-16 | Discharge: 2018-01-16 | Disposition: A | Discharge status: Home / Self Care | Payer: Medicare Other | Source: Ambulatory Visit | Attending: Pulmonary Disease | Admitting: Pulmonary Disease

## 2018-01-16 ENCOUNTER — Encounter: Payer: Self-pay | Admitting: Pulmonary Disease

## 2018-01-16 VITALS — BP 122/78 | HR 89 | Ht 62.0 in | Wt 141.0 lb

## 2018-01-16 DIAGNOSIS — J84112 Idiopathic pulmonary fibrosis: Secondary | ICD-10-CM

## 2018-01-16 DIAGNOSIS — J432 Centrilobular emphysema: Secondary | ICD-10-CM

## 2018-01-16 DIAGNOSIS — R0602 Shortness of breath: Secondary | ICD-10-CM | POA: Diagnosis not present

## 2018-01-16 DIAGNOSIS — F17210 Nicotine dependence, cigarettes, uncomplicated: Secondary | ICD-10-CM | POA: Diagnosis not present

## 2018-01-16 NOTE — Progress Notes (Signed)
Subjective:    Patient ID: Christine Meyer, female    DOB: 1950-11-04, 67 y.o.   MRN: [id number]  Synopsis: IPF and COPD: Christine Meyer was referred to the [facility] in 2016 for evaluation of shortness of breath. She had a CT chest performed which showed evidence of usual interstitial pneumonitis as well as centrilobular emphysema. She has a significant smoking history.  April 2016 rheumatology visit (Dr. Trudie Reed no evidence of connective tissue disease.  She was still smoking as of 10/2017.    HPI Chief Complaint  Patient presents with  . Follow-up    review PFT & SMW. pt reports sob with exertion.    She hasn't been coughing up as much phlegm lately.  She has been having a little pain in her left arm which she attributes to arthritis.  She has noticed this for 2-3 months.  It's worse with weather.  It is hard to move her shoulder.   Her breathing has been OK.  She says the only time she notices shortness of breath is when she is mopping or vacuuming.  She is only taking Esbriet 1 pill twice a day because she does not like taking the medicine.  No rash or diarrhea. Still smoking 3-4 cigarettes a day  Past Medical History:  Diagnosis Date  . Anemia    NOS  . CAD (coronary artery disease)    Stent RCA 1999  . COPD (chronic obstructive pulmonary disease) ([place])   . Diabetes mellitus, type 2 (HCC)    Diet controlled  . Hyperlipidemia   . Hypertension   . OSA on CPAP   . PVD (peripheral vascular disease) (HCC)    Carotid stenosis, renal artery stenosis  . Sleep apnea    dx'd but doesn't use mask   . Uterine cancer ([place]) ?1980's      Review of Systems  Constitutional: Negative for chills, fatigue and fever.  HENT: Negative for postnasal drip, rhinorrhea and sinus pressure.   Respiratory: Positive for shortness of breath. Negative for cough and wheezing.   Cardiovascular: Negative for chest pain, palpitations and leg swelling.       Objective:   Physical Exam Vitals:    01/16/18 0931  BP: 122/78  Pulse: 89  SpO2: 99%  Weight: 141 lb (64 kg)  Height: 5' 2"  (1.575 m)   RA  Gen: well appearing HENT: OP clear, TM's clear, neck supple PULM: Crackles 1/2 way up from bases, normal percussion CV: RRR, no mgr, trace edema GI: BS+, soft, nontender Derm: no cyanosis or rash Psyche: normal mood and affect   Chest imaging: 12 2015 CT chest> no pulmonary embolism, moderate centrilobular emphysema, there is interstitial septal thickening as well as honeycombing in a patchy and peripheral distribution with a clear craniocaudal gradient. This is consistent with usual interstitial pneumonitis.  August 2018 low-dose lung cancer screening CT: Showed emphysema, lung RA DS score 1, pulmonary fibrosis pattern indicative of nonspecific interstitial pneumonitis. No progression of fibrotic changes noted compared to the last CT.images independently reviewed, emphysema noted in the upper lobes, interlobular septal thickening and honeycombing in a peripheral distribution worse in the bases   Labs: 10/2014 ANA, Aldolase, CCP, SCL 70, SSA/SSB, Jo-1 all negative; however ESR 40, RF 17 (both elevated) August 2018 liver function testing normal  PFT: 11/2014 PFT> Ratio 73%, FEV1 1.71L (98% pred), TLC 4.16L (87% pred), DLCO 8.31 (38% pred) October 2016 pulmonary function testing ratio 83%, FEV1 2.00 L (113% predicted), FVC 2.41 L (106% predicted),  total lung capacity 3.82 L (80% predicted) C, DLCO 9.51 (44% predicted). 06/2017 PFT October 2018 FVC 2.4 L 108% predicted, total lung capacity 4.72 L 99% predicted, DLCO 9.07 42% predicted May 2019 FVC 2.33 L 106% predicted, total lung capacity 4.2 L 88% predicted, DLCO 8.6 mL 40% predicted  6MW:  October 2016 distance 333 m, O2 saturation nadir 98% room air November 2018 distance 288 m, O2 saturation 98% room air May 2019 distance 298 m, O2 saturation 93% room air     Assessment & Plan:   IPF (idiopathic pulmonary fibrosis)  (HCC)  Centrilobular emphysema (HCC)  Cigarette smoker  Discussion: I am concerned about Christine Meyer.  Today I talked about the fact that her oxygen level is lower than it has been and her diffusion capacity and forced vital capacity are down a bit.  This is alarming and very consistent with the natural history of IPF and that it will progress.  I think that it is probably progressing faster than anticipated because she is only taking a very small dose of the Esbriet right now and she continues to smoke cigarettes.  We talked at length about ways to get her to take a higher dose of Esbriet and she is willing to take the larger pill dose.  She says she has a stockpile of Esbriet at home because she has been taking such a low dose and after our conversation today she tells me she will resume taking 2 pills 3 times a day.  If she tolerates that well when it is time for a refill I will go ahead and change her to the highest dose which can be administered in the form of 1 pill 3 times a day.  We also talked about the fact that she desperately needs to quit smoking we chatted about this for 3 minutes.  Plan: IPF: Start taking Esbriet 2 pills 3 times a day until you run out of your supply, then call me and I will change you to the largest pill 1 pill 3 times a day We will check liver function test when you come back in 3 months  COPD: Continue Symbicort 2 puffs twice a day  Tobacco abuse: Quit smoking altogether Review the smoking cessation sheet we gave you  Follow-up 3 months or sooner if needed  Greater than 50% of this 25-minute visit spent face-to-face  Current Outpatient Medications:  .  ACCU-CHEK SOFTCLIX LANCETS lancets, Check blood sugar once daily. Dx:E11.9, Disp: 100 each, Rfl: 5 .  alendronate (FOSAMAX) 70 MG tablet, TAKE 1 TABLET BY MOUTH ONCE A WEEK. TAKE WITH A FULL  GLASS OF WATER ON AN EMPTY  STOMACH., Disp: 12 tablet, Rfl: 1 .  aspirin EC 81 MG tablet, Take 1 tablet (81 mg  total) by mouth daily., Disp: 90 tablet, Rfl: 0 .  atorvastatin (LIPITOR) 40 MG tablet, TAKE 1 TABLET BY MOUTH  DAILY, Disp: 90 tablet, Rfl: 0 .  benzonatate (TESSALON) 200 MG capsule, Take 1 capsule (200 mg total) by mouth 3 (three) times daily as needed for cough., Disp: 45 capsule, Rfl: 1 .  Blood Glucose Monitoring Suppl (ACCU-CHEK AVIVA PLUS) W/DEVICE KIT, Check Blood sugar daily. Dx E11.9, Disp: 1 kit, Rfl: 0 .  calcium carbonate (OS-CAL - DOSED IN MG OF ELEMENTAL CALCIUM) 1250 (500 Ca) MG tablet, Take 1 tablet (500 mg of elemental calcium total) by mouth daily with breakfast., Disp: 90 tablet, Rfl: 3 .  Cholecalciferol (VITAMIN D) 2000 units CAPS, Take 1  capsule (2,000 Units total) by mouth daily., Disp: 90 capsule, Rfl: 3 .  clopidogrel (PLAVIX) 75 MG tablet, TAKE 1 TABLET(75 MG) BY MOUTH DAILY, Disp: 90 tablet, Rfl: 2 .  IRON PO, Take 1 tablet by mouth daily., Disp: , Rfl:  .  meloxicam (MOBIC) 7.5 MG tablet, TAKE 1 TABLET(7.5 MG) BY MOUTH DAILY, Disp: 30 tablet, Rfl: 0 .  metoprolol succinate (TOPROL-XL) 25 MG 24 hr tablet, Take 1 tablet (25 mg total) by mouth daily., Disp: 30 tablet, Rfl: 2 .  Multiple Vitamins-Minerals (A THRU Z SELECT 50+ ADVANCED) TABS, TAKE 1 TABLET BY MOUTH DAILY, Disp: 220 tablet, Rfl: 1 .  olmesartan (BENICAR) 20 MG tablet, TAKE 1 TABLET(20 MG) BY MOUTH DAILY, Disp: 90 tablet, Rfl: 0 .  ONETOUCH VERIO test strip, USE TO TEST BLOOD SUGAR ONCE DAILY, Disp: 75 each, Rfl: 0 .  Pirfenidone (ESBRIET) 267 MG CAPS, TAKE 2 CAPSULES BY MOUTH THREE TIMES A DAY (Patient taking differently: TAKE 1 CAPSULE BY MOUTH THREE TIMES A DAY), Disp: 180 capsule, Rfl: 11 .  spironolactone (ALDACTONE) 25 MG tablet, TAKE 1 TABLET(25 MG) BY MOUTH DAILY, Disp: 90 tablet, Rfl: 0 .  spironolactone (ALDACTONE) 25 MG tablet, TAKE 1 TABLET(25 MG) BY MOUTH DAILY, Disp: 90 tablet, Rfl: 0 .  SYMBICORT 160-4.5 MCG/ACT inhaler, INHALE 2 PUFFS INTO THE LUNGS TWICE DAILY, Disp: 10.2 g, Rfl: 0

## 2018-01-16 NOTE — Patient Instructions (Signed)
IPF: Start taking Esbriet 2 pills 3 times a day until you run out of your supply, then call me and I will change you to the largest pill 1 pill 3 times a day We will check liver function test when you come back in 3 months  COPD: Continue Symbicort 2 puffs twice a day  Tobacco abuse: Quit smoking altogether Review the smoking cessation sheet we gave you  Follow-up 3 months or sooner if needed

## 2018-01-16 NOTE — Progress Notes (Signed)
SIX MIN WALK 01/16/2018 07/26/2017 07/07/2015 10/19/2014  Medications Symbicort @ 0800 Symbicort 160 taken at 8:00 Albuterol 4 puffs taken approx 11:45 for PFT -  Supplimental Oxygen during Test? (L/min) No No No No  Laps 6 6 6  -  Partial Lap (in Meters) 10 0 45 -  Baseline BP (sitting) 146/64 146/62 138/70 -  Baseline Heartrate 71 87 73 -  Baseline Dyspnea (Borg Scale) 2 0 0 -  Baseline Fatigue (Borg Scale) 5 2 0 -  Baseline SPO2 100 100 98 -  BP (sitting) 154/60 168/78 148/84 -  Heartrate 81 99 89 -  Dyspnea (Borg Scale) 1 0 0 -  Fatigue (Borg Scale) 5 2 0 -  SPO2 93 98 98 -  BP (sitting) 140/62 160/70 138/76 -  Heartrate 68 89 74 -  SPO2 100 100 100 -  Stopped or Paused before Six Minutes No No No -  Distance Completed 298 288 333 -  Tech Comments: patient walked at a slow-moderate pace, did not stop to rest or pause during the test.  pt did not complain of any angina, dizziness, hip/leg/calf pain at end of test.  pt did note that her elevated BP and fatigue is from working last night and lack of sleep today. pt completed test at slow pace with no complaints or desats.  pt tolerated walk well.  pt walked a slow pace, tolerated walk well.

## 2018-01-16 NOTE — Addendum Note (Signed)
Addended by: Maryanna Shape A on: 01/16/2018 10:18 AM   Modules accepted: Orders

## 2018-01-16 NOTE — Addendum Note (Signed)
Addended by: Maryanna Shape A on: 01/16/2018 10:05 AM   Modules accepted: Orders

## 2018-01-19 ENCOUNTER — Emergency Department (HOSPITAL_BASED_OUTPATIENT_CLINIC_OR_DEPARTMENT_OTHER)
Admission: EM | Admit: 2018-01-19 | Discharge: 2018-01-19 | Disposition: A | Discharge status: Home / Self Care | Payer: Medicare Other | Attending: Emergency Medicine | Admitting: Emergency Medicine

## 2018-01-19 ENCOUNTER — Encounter (HOSPITAL_BASED_OUTPATIENT_CLINIC_OR_DEPARTMENT_OTHER): Payer: Self-pay

## 2018-01-19 ENCOUNTER — Emergency Department (HOSPITAL_BASED_OUTPATIENT_CLINIC_OR_DEPARTMENT_OTHER): Payer: Medicare Other

## 2018-01-19 ENCOUNTER — Other Ambulatory Visit: Payer: Self-pay

## 2018-01-19 DIAGNOSIS — M79602 Pain in left arm: Secondary | ICD-10-CM

## 2018-01-19 DIAGNOSIS — D649 Anemia, unspecified: Secondary | ICD-10-CM | POA: Diagnosis not present

## 2018-01-19 DIAGNOSIS — Z8541 Personal history of malignant neoplasm of cervix uteri: Secondary | ICD-10-CM | POA: Insufficient documentation

## 2018-01-19 DIAGNOSIS — E119 Type 2 diabetes mellitus without complications: Secondary | ICD-10-CM | POA: Insufficient documentation

## 2018-01-19 DIAGNOSIS — Z79899 Other long term (current) drug therapy: Secondary | ICD-10-CM | POA: Diagnosis not present

## 2018-01-19 DIAGNOSIS — Z7902 Long term (current) use of antithrombotics/antiplatelets: Secondary | ICD-10-CM | POA: Diagnosis not present

## 2018-01-19 DIAGNOSIS — I10 Essential (primary) hypertension: Secondary | ICD-10-CM | POA: Insufficient documentation

## 2018-01-19 DIAGNOSIS — M25522 Pain in left elbow: Secondary | ICD-10-CM | POA: Diagnosis not present

## 2018-01-19 DIAGNOSIS — Z7982 Long term (current) use of aspirin: Secondary | ICD-10-CM | POA: Diagnosis not present

## 2018-01-19 DIAGNOSIS — J449 Chronic obstructive pulmonary disease, unspecified: Secondary | ICD-10-CM | POA: Diagnosis not present

## 2018-01-19 DIAGNOSIS — I2510 Atherosclerotic heart disease of native coronary artery without angina pectoris: Secondary | ICD-10-CM | POA: Diagnosis not present

## 2018-01-19 DIAGNOSIS — M25512 Pain in left shoulder: Secondary | ICD-10-CM | POA: Diagnosis not present

## 2018-01-19 LAB — BASIC METABOLIC PANEL
Anion gap: 4 — ABNORMAL LOW (ref 5–15)
BUN: 15 mg/dL (ref 6–20)
CO2: 21 mmol/L — AB (ref 22–32)
Calcium: 8.8 mg/dL — ABNORMAL LOW (ref 8.9–10.3)
Chloride: 111 mmol/L (ref 101–111)
Creatinine, Ser: 1.15 mg/dL — ABNORMAL HIGH (ref 0.44–1.00)
GFR calc Af Amer: 55 mL/min — ABNORMAL LOW (ref >60)
GFR, EST NON AFRICAN AMERICAN: 48 mL/min — AB (ref >60)
GLUCOSE: 99 mg/dL (ref 65–99)
POTASSIUM: 4 mmol/L (ref 3.5–5.1)
Sodium: 136 mmol/L (ref 135–145)

## 2018-01-19 LAB — CBC
HEMATOCRIT: 28 % — AB (ref 36.0–46.0)
Hemoglobin: 9.9 g/dL — ABNORMAL LOW (ref 12.0–15.0)
MCH: 34.6 pg — AB (ref 26.0–34.0)
MCHC: 35.4 g/dL (ref 30.0–36.0)
MCV: 97.9 fL (ref 78.0–100.0)
PLATELETS: 211 10*3/uL (ref 150–400)
RBC: 2.86 MIL/uL — ABNORMAL LOW (ref 3.87–5.11)
RDW: 18.7 % — ABNORMAL HIGH (ref 11.5–15.5)
WBC: 5.7 10*3/uL (ref 4.0–10.5)

## 2018-01-19 LAB — TROPONIN I: Troponin I: <0.03 ng/mL (ref <0.03)

## 2018-01-19 MED ORDER — DOCUSATE SODIUM 100 MG PO CAPS: 100.0000 mg | ORAL_CAPSULE | Freq: Two times a day (BID) | ORAL | 0 refills | Status: DC

## 2018-01-19 MED ORDER — TRAMADOL HCL 50 MG PO TABS
50.0000 mg | ORAL_TABLET | Freq: Once | ORAL | Status: AC
  Administered 2018-01-19: 50 mg via ORAL
  Filled 2018-01-19: qty 1

## 2018-01-19 MED ORDER — TRAMADOL HCL 50 MG PO TABS: 50.0000 mg | ORAL_TABLET | Freq: Four times a day (QID) | ORAL | 0 refills | Status: DC | PRN

## 2018-01-19 NOTE — Discharge Instructions (Signed)
You may continue Tylenol 1000 mg every 6 hours as needed for pain.  Please follow-up closely with your primary care physician to follow your hemoglobin which was slightly low today and lower than your baseline and follow-up for your blood pressure.  You may follow-up with your sports medicine physician or our orthopedist for your left shoulder pain.  Your cardiac labs were normal today.

## 2018-01-19 NOTE — ED Triage Notes (Addendum)
Pt reports left arm pain w/ radiation to left posterior shoulder 10/10 for multiple days she describes as "aching". Pt denies cp and sob. Pt A+OX4, NAD. Pt denies falls.

## 2018-01-19 NOTE — ED Provider Notes (Signed)
TIME SEEN: 2:59 AM  CHIEF COMPLAINT: Left shoulder and elbow pain  HPI: Patient is a 67 year old right-hand-dominant female with history of CAD status post stent, hypertension, diabetes, hyperlipidemia, COPD who presents to the emergency department with 4 weeks of left shoulder and elbow pain.  Describes the pain as a pressure and is worse with movement of the arm, palpation.  Has tried Tylenol and Mobic with relief of pain.  No chest pain or chest discomfort.  No shortness of breath.  No nausea, vomiting, dizziness or diaphoresis.  No known injury to this arm.  States that she called her granddaughter tonight complaining of pain and daughter brought her to the emergency department to make sure that everything was okay.  She does have a PCP for follow-up.  Has not seen her primary care physician for this pain.  Did see her pulmonologist Dr. Lake Bells on the ninth who ordered an outpatient chest x-ray which showed no acute abnormality.  ROS: See HPI Constitutional: no fever  Eyes: no drainage  ENT: no runny nose   Cardiovascular:  no chest pain  Resp: no SOB  GI: no vomiting GU: no dysuria Integumentary: no rash  Allergy: no hives  Musculoskeletal: no leg swelling  Neurological: no slurred speech ROS otherwise negative  PAST MEDICAL HISTORY/PAST SURGICAL HISTORY:  Past Medical History:  Diagnosis Date  . Anemia    NOS  . CAD (coronary artery disease)    Stent RCA 1999  . COPD (chronic obstructive pulmonary disease) ([place])   . Diabetes mellitus, type 2 (HCC)    Diet controlled  . Hyperlipidemia   . Hypertension   . OSA on CPAP   . PVD (peripheral vascular disease) (HCC)    Carotid stenosis, renal artery stenosis  . Sleep apnea    dx'd but doesn't use mask   . Uterine cancer ([place]) ?1980's    MEDICATIONS:  Prior to Admission medications   Medication Sig Start Date End Date Taking? Authorizing Provider  ACCU-CHEK SOFTCLIX LANCETS lancets Check blood sugar once daily. Dx:E11.9  04/01/15   Roma Schanz R, DO  alendronate (FOSAMAX) 70 MG tablet TAKE 1 TABLET BY MOUTH ONCE A WEEK. TAKE WITH A FULL  GLASS OF WATER ON AN EMPTY  STOMACH. 04/29/17   Carollee Herter, Alferd Apa, DO  aspirin EC 81 MG tablet Take 1 tablet (81 mg total) by mouth daily. 03/20/17   Minus Breeding, MD  atorvastatin (LIPITOR) 40 MG tablet TAKE 1 TABLET BY MOUTH  DAILY 05/03/17   Carollee Herter, Alferd Apa, DO  benzonatate (TESSALON) 200 MG capsule Take 1 capsule (200 mg total) by mouth 3 (three) times daily as needed for cough. 06/25/17   Juanito Doom, MD  Blood Glucose Monitoring Suppl (ACCU-CHEK AVIVA PLUS) W/DEVICE KIT Check Blood sugar daily. Dx E11.9 04/01/15   Carollee Herter, Alferd Apa, DO  calcium carbonate (OS-CAL - DOSED IN MG OF ELEMENTAL CALCIUM) 1250 (500 Ca) MG tablet Take 1 tablet (500 mg of elemental calcium total) by mouth daily with breakfast. 04/22/17   Carollee Herter, Alferd Apa, DO  Cholecalciferol (VITAMIN D) 2000 units CAPS Take 1 capsule (2,000 Units total) by mouth daily. 11/09/16   Ann Held, DO  clopidogrel (PLAVIX) 75 MG tablet TAKE 1 TABLET(75 MG) BY MOUTH DAILY 10/17/17   Minus Breeding, MD  IRON PO Take 1 tablet by mouth daily.    [provider]  meloxicam (MOBIC) 7.5 MG tablet TAKE 1 TABLET(7.5 MG) BY MOUTH DAILY 09/20/17  Carollee Herter, Yvonne R, DO  metoprolol succinate (TOPROL-XL) 25 MG 24 hr tablet Take 1 tablet (25 mg total) by mouth daily. 11/01/17   Ann Held, DO  Multiple Vitamins-Minerals (A THRU Z SELECT 50+ ADVANCED) TABS TAKE 1 TABLET BY MOUTH DAILY 05/17/16   Carollee Herter, Alferd Apa, DO  olmesartan (BENICAR) 20 MG tablet TAKE 1 TABLET(20 MG) BY MOUTH DAILY 11/14/17   Carollee Herter, Alferd Apa, DO  ONETOUCH VERIO test strip USE TO TEST BLOOD SUGAR ONCE DAILY 08/15/17   Copland, Gay Filler, MD  Pirfenidone (ESBRIET) 267 MG CAPS TAKE 2 CAPSULES BY MOUTH THREE TIMES A DAY Patient taking differently: TAKE 1 CAPSULE BY MOUTH THREE TIMES A DAY 03/26/17   Juanito Doom, MD  spironolactone (ALDACTONE) 25 MG tablet TAKE 1 TABLET(25 MG) BY MOUTH DAILY 08/14/17   Carollee Herter, Kendrick Fries R, DO  spironolactone (ALDACTONE) 25 MG tablet TAKE 1 TABLET(25 MG) BY MOUTH DAILY 11/11/17   Carollee Herter, Alferd Apa, DO  SYMBICORT 160-4.5 MCG/ACT inhaler INHALE 2 PUFFS INTO THE LUNGS TWICE DAILY 08/13/17   Juanito Doom, MD    ALLERGIES:  No Known Allergies  SOCIAL HISTORY:  Social History   Tobacco Use  . Smoking status: Current Every Day Smoker    Packs/day: 0.60    Years: 51.00    Pack years: 30.60    Types: Cigarettes  . Smokeless tobacco: Never Used  . Tobacco comment: down to 5-6 cigarettes daily  Substance Use Topics  . Alcohol use: No    Alcohol/week: 0.0 oz    FAMILY HISTORY: Family History  Problem Relation Age of Onset  . Heart failure Mother        after hip fx.   . Hypertension Mother   . Diabetes Mother   . Deep vein thrombosis Mother   . Hypertension Father   . Diabetes Sister        insulin dependent  . Hypertension Brother   . Emphysema Brother   . Diabetes Brother        insulin dependent  . Diabetes Maternal Aunt        non-insulent dependent  . Heart attack Paternal Aunt   . Diabetes Maternal Grandmother        non-insulin dependent    EXAM: BP (!) 199/69 (BP Location: Left Arm) Comment: taken twice, RN notified   Pulse 79   Temp 98.7 F (37.1 C) (Oral)   Resp 18   Ht 5' 2"  (1.575 m)   Wt 64 kg (141 lb)   SpO2 96%   BMI 25.79 kg/m  CONSTITUTIONAL: Alert and oriented and responds appropriately to questions. Well-appearing; well-nourished, elderly, in no distress HEAD: Normocephalic EYES: Conjunctivae clear, pupils appear equal, EOMI ENT: normal nose; moist mucous membranes NECK: Supple, no meningismus, no nuchal rigidity, no LAD  CARD: RRR; S1 and S2 appreciated; no murmurs, no clicks, no rubs, no gallops RESP: Normal chest excursion without splinting or tachypnea; breath sounds clear and equal bilaterally; no  wheezes, no rhonchi, no rales, no hypoxia or respiratory distress, speaking full sentences ABD/GI: Normal bowel sounds; non-distended; soft, non-tender, no rebound, no guarding, no peritoneal signs, no hepatosplenomegaly BACK:  The back appears normal and is non-tender to palpation, there is no CVA tenderness EXT: Tender to palpation over the left shoulder and left elbow without bony abnormality.  Compartments are soft.  No redness or warmth.  Decreased range of motion in the left shoulder secondary to pain with full range  of motion in the left elbow, wrist, hand and fingers.  Normal grip strength the left side.  2+ left radial pulse.  Normal sensation throughout the left arm.  Otherwise normal ROM in all joints; otherwise extremities are non-tender to palpation; no edema; normal capillary refill; no cyanosis, no calf tenderness or swelling    SKIN: Normal color for age and race; warm; no rash NEURO: Moves all extremities equally PSYCH: The patient's mood and manner are appropriate. Grooming and personal hygiene are appropriate.  MEDICAL DECISION MAKING: Patient here with left arm pain.  I suspect it is musculoskeletal in nature, possibly rotator cuff tendinitis, partial tear.  She does have significant risk factors for cardiac disease and given that she describes as a pressure we will obtain cardiac work-up just to rule out that this is her heart.  She is hypertensive here but I think this is due to discomfort.  I have offered her pain medication which she declines.  I do not think she has a dissection or PE.  There is no sign of septic arthritis, gout, compartment syndrome on examination.  She did have a chest x-ray on May 9 that showed no acute abnormality.  I do not feel this needs to be repeated today.  Will obtain x-rays of the left shoulder and elbow to rule any pathologic fracture.  Patient and family comfortable with this plan.  She has outpatient follow-up.  ED PROGRESS: Patient's labs  unremarkable other than hemoglobin of 9.9.  She denies vaginal bleeding, bloody stools or melena.  We discussed the finding of anemia and recommended close recheck with her primary care physician.  Troponin negative.  X-rays of her left upper extremity are unremarkable.  Blood pressure has slowly improved without intervention but still elevated which I think is from pain.  She will follow-up closely with her primary to ensure this is improving.  She does agree to take tramadol here in the ED for pain control will discharge with prescription for the same.  They will follow-up with her outpatient primary care doctor and I will also give him orthopedic follow-up.  Discussed return precautions.   At this time, I do not feel there is any life-threatening condition present. I have reviewed and discussed all results (EKG, imaging, lab, urine as appropriate) and exam findings with patient/family. I have reviewed nursing notes and appropriate previous records.  I feel the patient is safe to be discharged home without further emergent workup and can continue workup as an outpatient as needed. Discussed usual and customary return precautions. Patient/family verbalize understanding and are comfortable with this plan.  Outpatient follow-up has been provided if needed. All questions have been answered.      EKG Interpretation  Date/Time:  Sunday Jan 19 2018 03:05:52 EDT Ventricular Rate:  64 PR Interval:    QRS Duration: 84 QT Interval:  403 QTC Calculation: 416 R Axis:   69 Text Interpretation:  Sinus rhythm Consider left atrial enlargement No old tracing to compare Confirmed by Ward, Cyril Mourning [id number]) on 01/19/2018 3:13:47 AM         Ward, Delice Bison, DO 01/19/18 0448

## 2018-01-24 ENCOUNTER — Encounter: Payer: Self-pay | Admitting: Family Medicine

## 2018-01-24 ENCOUNTER — Ambulatory Visit: Payer: Self-pay

## 2018-01-24 ENCOUNTER — Ambulatory Visit (INDEPENDENT_AMBULATORY_CARE_PROVIDER_SITE_OTHER): Payer: Medicare Other | Admitting: Family Medicine

## 2018-01-24 VITALS — BP 160/50 | HR 67 | Temp 98.2°F | Resp 16 | Wt 143.4 lb

## 2018-01-24 VITALS — BP 140/60 | HR 71 | Ht 62.0 in | Wt 144.0 lb

## 2018-01-24 DIAGNOSIS — M75112 Incomplete rotator cuff tear or rupture of left shoulder, not specified as traumatic: Secondary | ICD-10-CM

## 2018-01-24 DIAGNOSIS — M25512 Pain in left shoulder: Secondary | ICD-10-CM | POA: Diagnosis not present

## 2018-01-24 DIAGNOSIS — G8929 Other chronic pain: Secondary | ICD-10-CM | POA: Diagnosis not present

## 2018-01-24 DIAGNOSIS — R1013 Epigastric pain: Secondary | ICD-10-CM

## 2018-01-24 DIAGNOSIS — D649 Anemia, unspecified: Secondary | ICD-10-CM | POA: Diagnosis not present

## 2018-01-24 DIAGNOSIS — M8580 Other specified disorders of bone density and structure, unspecified site: Secondary | ICD-10-CM | POA: Diagnosis not present

## 2018-01-24 DIAGNOSIS — M75102 Unspecified rotator cuff tear or rupture of left shoulder, not specified as traumatic: Secondary | ICD-10-CM | POA: Insufficient documentation

## 2018-01-24 DIAGNOSIS — E348 Other specified endocrine disorders: Secondary | ICD-10-CM | POA: Diagnosis not present

## 2018-01-24 MED ORDER — VITAMIN D3 50 MCG (2000 UT) PO CAPS: 2000.0000 [IU] | ORAL_CAPSULE | Freq: Every day | ORAL | 11 refills | Status: DC

## 2018-01-24 MED ORDER — RANITIDINE HCL 150 MG PO TABS: 150.0000 mg | ORAL_TABLET | Freq: Two times a day (BID) | ORAL | 5 refills | Status: DC

## 2018-01-24 NOTE — Progress Notes (Signed)
Subjective:  I acted as a Education administrator for [employer]. Yancey Flemings, [place]   Patient ID: Christine Meyer, female    DOB: September 28, 1950, 67 y.o.   MRN: [id number]  Chief Complaint  Patient presents with  . Follow-up    er f/u    HPI  Patient is in today for follow up visit.  She went to er with L arm/ shoulder pain.  Cardiac w/u neg.  Pt has already seen Dr Lovenia Kim for shoulder pain and she has a tear. Pt here to f/u anemia found with w/u.  No other complaints.  Daughter is with pt today.   Patient Care Team: Carollee Herter, Alferd Apa, DO as PCP - General (Family Medicine) Calvert Cantor, MD as Consulting Physician (Ophthalmology) Juanito Doom, MD as Consulting Physician (Pulmonary Disease) Lorretta Harp, MD as Consulting Physician (Cardiology)   Past Medical History:  Diagnosis Date  . Anemia    NOS  . CAD (coronary artery disease)    Stent RCA 1999  . COPD (chronic obstructive pulmonary disease) ([place])   . Diabetes mellitus, type 2 (HCC)    Diet controlled  . Hyperlipidemia   . Hypertension   . OSA on CPAP   . PVD (peripheral vascular disease) (HCC)    Carotid stenosis, renal artery stenosis  . Sleep apnea    dx'd but doesn't use mask   . Uterine cancer ([place]) ?8546'E    Past Surgical History:  Procedure Laterality Date  . ABDOMINAL ANGIOGRAM  11/22/2014   Procedure: ABDOMINAL ANGIOGRAM;  Surgeon: Lorretta Harp, MD;  Location: [facility] CATH LAB;  Service: Cardiovascular;;  . COLONOSCOPY  2000   negative  . CORONARY ANGIOPLASTY WITH STENT PLACEMENT  1999   RCA; Dr. Melvern Banker G4 P4  . ILIAC ARTERY STENT Right 11/22/2014  . LOWER EXTREMITY ANGIOGRAM N/A 11/22/2014   Procedure: LOWER EXTREMITY ANGIOGRAM;  Surgeon: Lorretta Harp, MD; L-SFA 100%, 3v runoff, R-pCIA 95>>0% w/ 8 mm x 18 mm long Balloon expandable stent; R-dCIA 67>>0% w/ 8 mm x 4 cm long Cordis Smart Nitinol self expanding stent   . RENAL ARTERY STENT Right May 2007; 08/2006   ; restenosis  . TOTAL ABDOMINAL  HYSTERECTOMY  ?1980's    for uterine CA    Family History  Problem Relation Age of Onset  . Heart failure Mother        after hip fx.   . Hypertension Mother   . Diabetes Mother   . Deep vein thrombosis Mother   . Hypertension Father   . Diabetes Sister        insulin dependent  . Hypertension Brother   . Emphysema Brother   . Diabetes Brother        insulin dependent  . Diabetes Maternal Aunt        non-insulent dependent  . Heart attack Paternal Aunt   . Diabetes Maternal Grandmother        non-insulin dependent    Social History   Socioeconomic History  . Marital status: Single    Spouse name: Not on file  . Number of children: 4  . Years of education: Not on file  . Highest education level: Not on file  Occupational History  . Not on file  Social Needs  . Financial resource strain: Not on file  . Food insecurity:    Worry: Not on file    Inability: Not on file  . Transportation needs:    Medical: Not on file  Non-medical: Not on file  Tobacco Use  . Smoking status: Current Every Day Smoker    Packs/day: 0.60    Years: 51.00    Pack years: 30.60    Types: Cigarettes  . Smokeless tobacco: Never Used  . Tobacco comment: down to 5-6 cigarettes daily  Substance and Sexual Activity  . Alcohol use: No    Alcohol/week: 0.0 oz  . Drug use: No  . Sexual activity: Yes  Lifestyle  . Physical activity:    Days per week: Not on file    Minutes per session: Not on file  . Stress: Not on file  Relationships  . Social connections:    Talks on phone: Not on file    Gets together: Not on file    Attends religious service: Not on file    Active member of club or organization: Not on file    Attends meetings of clubs or organizations: Not on file    Relationship status: Not on file  . Intimate partner violence:    Fear of current or ex partner: Not on file    Emotionally abused: Not on file    Physically abused: Not on file    Forced sexual activity: Not on  file  Other Topics Concern  . Not on file  Social History Narrative   Lives at home with grandson.     Outpatient Medications Prior to Visit  Medication Sig Dispense Refill  . ACCU-CHEK SOFTCLIX LANCETS lancets Check blood sugar once daily. Dx:E11.9 100 each 5  . alendronate (FOSAMAX) 70 MG tablet TAKE 1 TABLET BY MOUTH ONCE A WEEK. TAKE WITH A FULL  GLASS OF WATER ON AN EMPTY  STOMACH. 12 tablet 1  . aspirin EC 81 MG tablet Take 1 tablet (81 mg total) by mouth daily. 90 tablet 0  . atorvastatin (LIPITOR) 40 MG tablet TAKE 1 TABLET BY MOUTH  DAILY 90 tablet 0  . benzonatate (TESSALON) 200 MG capsule Take 1 capsule (200 mg total) by mouth 3 (three) times daily as needed for cough. 45 capsule 1  . Blood Glucose Monitoring Suppl (ACCU-CHEK AVIVA PLUS) W/DEVICE KIT Check Blood sugar daily. Dx E11.9 1 kit 0  . calcium carbonate (OS-CAL - DOSED IN MG OF ELEMENTAL CALCIUM) 1250 (500 Ca) MG tablet Take 1 tablet (500 mg of elemental calcium total) by mouth daily with breakfast. 90 tablet 3  . Cholecalciferol (VITAMIN D) 2000 units CAPS Take 1 capsule (2,000 Units total) by mouth daily. 90 capsule 3  . clopidogrel (PLAVIX) 75 MG tablet TAKE 1 TABLET(75 MG) BY MOUTH DAILY 90 tablet 2  . docusate sodium (COLACE) 100 MG capsule Take 1 capsule (100 mg total) by mouth every 12 (twelve) hours. 30 capsule 0  . IRON PO Take 1 tablet by mouth daily.    . meloxicam (MOBIC) 7.5 MG tablet TAKE 1 TABLET(7.5 MG) BY MOUTH DAILY 30 tablet 0  . metoprolol succinate (TOPROL-XL) 25 MG 24 hr tablet Take 1 tablet (25 mg total) by mouth daily. 30 tablet 2  . Multiple Vitamins-Minerals (A THRU Z SELECT 50+ ADVANCED) TABS TAKE 1 TABLET BY MOUTH DAILY 220 tablet 1  . olmesartan (BENICAR) 20 MG tablet TAKE 1 TABLET(20 MG) BY MOUTH DAILY 90 tablet 0  . ONETOUCH VERIO test strip USE TO TEST BLOOD SUGAR ONCE DAILY 75 each 0  . Pirfenidone (ESBRIET) 267 MG CAPS TAKE 2 CAPSULES BY MOUTH THREE TIMES A DAY (Patient taking  differently: TAKE 1 CAPSULE BY MOUTH THREE  TIMES A DAY) 180 capsule 11  . spironolactone (ALDACTONE) 25 MG tablet TAKE 1 TABLET(25 MG) BY MOUTH DAILY 90 tablet 0  . spironolactone (ALDACTONE) 25 MG tablet TAKE 1 TABLET(25 MG) BY MOUTH DAILY 90 tablet 0  . SYMBICORT 160-4.5 MCG/ACT inhaler INHALE 2 PUFFS INTO THE LUNGS TWICE DAILY 10.2 g 0  . traMADol (ULTRAM) 50 MG tablet Take 1 tablet (50 mg total) by mouth every 6 (six) hours as needed. 15 tablet 0   No facility-administered medications prior to visit.     No Known Allergies  Review of Systems  Constitutional: Positive for weight loss. Negative for chills, fever and malaise/fatigue.  HENT: Negative for congestion and hearing loss.   Eyes: Negative for discharge.  Respiratory: Negative for cough, sputum production and shortness of breath.   Cardiovascular: Negative for chest pain, palpitations and leg swelling.  Gastrointestinal: Negative for abdominal pain, blood in stool, constipation, diarrhea, heartburn, nausea and vomiting.  Genitourinary: Negative for dysuria, frequency, hematuria and urgency.  Musculoskeletal: Positive for joint pain. Negative for back pain, falls and myalgias.  Skin: Negative for rash.  Neurological: Negative for dizziness, sensory change, loss of consciousness, weakness and headaches.  Endo/Heme/Allergies: Negative for environmental allergies. Does not bruise/bleed easily.  Psychiatric/Behavioral: Negative for depression and suicidal ideas. The patient is not nervous/anxious and does not have insomnia.        Objective:    Physical Exam  Constitutional: She is oriented to person, place, and time. She appears well-developed and well-nourished.  HENT:  Head: Normocephalic and atraumatic.  Eyes: Conjunctivae and EOM are normal.  Neck: Normal range of motion. Neck supple. No JVD present. Carotid bruit is not present. No thyromegaly present.  Cardiovascular: Normal rate, regular rhythm and normal heart sounds.   No murmur heard. Pulmonary/Chest: Effort normal and breath sounds normal. No respiratory distress. She has no wheezes. She has no rales. She exhibits no tenderness.  Musculoskeletal: She exhibits tenderness. She exhibits no edema.  Neurological: She is alert and oriented to person, place, and time.  Psychiatric: She has a normal mood and affect.  Nursing note and vitals reviewed.   BP (!) 160/50 (BP Location: Left Arm, Patient Position: Sitting, Cuff Size: Normal)   Pulse 67   Temp 98.2 F (36.8 C) (Oral)   Resp 16   Wt 143 lb 6.4 oz (65 kg)   SpO2 100%   BMI 26.23 kg/m  Wt Readings from Last 3 Encounters:  01/24/18 143 lb 6.4 oz (65 kg)  01/24/18 144 lb (65.3 kg)  01/19/18 141 lb (64 kg)   BP Readings from Last 3 Encounters:  01/24/18 (!) 160/50  01/24/18 140/60  01/19/18 (!) 176/65     Immunization History  Administered Date(s) Administered  . Influenza, High Dose Seasonal PF 06/25/2017  . Pneumococcal Conjugate-13 09/06/2014  . Pneumococcal Polysaccharide-23 09/08/2015  . Td 11/09/2003    Health Maintenance  Topic Date Due  . TETANUS/TDAP  11/08/2013  . OPHTHALMOLOGY EXAM  09/27/2016  . HEMOGLOBIN A1C  04/14/2017  . FOOT EXAM  10/15/2017  . INFLUENZA VACCINE  04/10/2018  . MAMMOGRAM  12/14/2019  . COLONOSCOPY  02/13/2021  . DEXA SCAN  Completed  . Hepatitis C Screening  Completed  . PNA vac Low Risk Adult  Completed    Lab Results  Component Value Date   WBC 5.0 01/24/2018   HGB 9.9 (L) 01/24/2018   HCT 29.3 (L) 01/24/2018   PLT 222 01/24/2018   GLUCOSE 99 01/19/2018  CHOL 155 10/15/2016   TRIG 71.0 10/15/2016   HDL 59.20 10/15/2016   LDLCALC 82 10/15/2016   ALT 21 10/24/2017   AST 21 10/24/2017   NA 136 01/19/2018   K 4.0 01/19/2018   CL 111 01/19/2018   CREATININE 1.15 (H) 01/19/2018   BUN 15 01/19/2018   CO2 21 (L) 01/19/2018   TSH 1.41 04/22/2017   INR 0.95 11/11/2014   HGBA1C 6.1 10/15/2016   MICROALBUR <0.7 03/10/2015    Lab  Results  Component Value Date   TSH 1.41 04/22/2017   Lab Results  Component Value Date   WBC 5.0 01/24/2018   HGB 9.9 (L) 01/24/2018   HCT 29.3 (L) 01/24/2018   MCV 98.3 01/24/2018   PLT 222 01/24/2018   Lab Results  Component Value Date   NA 136 01/19/2018   K 4.0 01/19/2018   CO2 21 (L) 01/19/2018   GLUCOSE 99 01/19/2018   BUN 15 01/19/2018   CREATININE 1.15 (H) 01/19/2018   BILITOT 0.3 10/24/2017   ALKPHOS 63 10/24/2017   AST 21 10/24/2017   ALT 21 10/24/2017   PROT 7.0 10/24/2017   ALBUMIN 3.9 10/24/2017   CALCIUM 8.8 (L) 01/19/2018   ANIONGAP 4 (L) 01/19/2018   GFR 73.43 04/22/2017   Lab Results  Component Value Date   CHOL 155 10/15/2016   Lab Results  Component Value Date   HDL 59.20 10/15/2016   Lab Results  Component Value Date   LDLCALC 82 10/15/2016   Lab Results  Component Value Date   TRIG 71.0 10/15/2016   Lab Results  Component Value Date   CHOLHDL 3 10/15/2016   Lab Results  Component Value Date   HGBA1C 6.1 10/15/2016         Assessment & Plan:   Problem List Items Addressed This Visit      Unprioritized   ANEMIA-NOS    Check ifob Recheck labs Consider GI referral       Relevant Orders   CBC with Differential/Platelet (Completed)   Fecal occult blood, imunochemical   Iron, TIBC and Ferritin Panel (Completed)   Dyspepsia    Zantac bid GI referral      Relevant Medications   ranitidine (ZANTAC) 150 MG tablet   Osteopenia - Primary   Relevant Medications   Cholecalciferol (VITAMIN D3) 2000 units capsule   Other Relevant Orders   VITAMIN D 25 Hydroxy (Vit-D Deficiency, Fractures) (Completed)      I am having Kambri Sneeringer start on Vitamin D3 and ranitidine. I am also having her maintain her IRON PO, ACCU-CHEK AVIVA PLUS, ACCU-CHEK SOFTCLIX LANCETS, A THRU Z SELECT 50+ ADVANCED, Vitamin D, aspirin EC, Pirfenidone, calcium carbonate, alendronate, atorvastatin, benzonatate, SYMBICORT, spironolactone, ONETOUCH VERIO,  meloxicam, clopidogrel, metoprolol succinate, spironolactone, olmesartan, traMADol, and docusate sodium.  Meds ordered this encounter  Medications  . Cholecalciferol (VITAMIN D3) 2000 units capsule    Sig: Take 1 capsule (2,000 Units total) by mouth daily.    Dispense:  60 capsule    Refill:  11  . ranitidine (ZANTAC) 150 MG tablet    Sig: Take 1 tablet (150 mg total) by mouth 2 (two) times daily.    Dispense:  60 tablet    Refill:  5    CMA served as scribe during this visit. History, Physical and Plan performed by medical provider. Documentation and orders reviewed and attested to.  Ann Held, DO

## 2018-01-24 NOTE — Assessment & Plan Note (Signed)
Rotator cuff tear is noted.  No significant retraction are noted.  Patient does have fairly good strength still.  Many other health problems.  Discussed icing regimen, topical anti-inflammatories, home exercises given.  Discussed which activities to do which was to avoid.  Follow-up again 4 weeks

## 2018-01-24 NOTE — Patient Instructions (Signed)
Good to meet you  Ice 20 minutes 2 times daily. Usually after activity and before bed. pennsaid pinkie amount topically 2 times daily as needed.   Exercises 3 times a week.  Try to keep hands within peripheral vision  Increase vitamin D to 4000 IU daily  Ask Lowne about your hemoglobin. See me again in 3-4 weeks and if not better we will try injection

## 2018-01-24 NOTE — Progress Notes (Signed)
[facility] [address] [place], [place] [address] Phone: [phone] Subjective:     CC: Left shoulder pain  IRC:[id number]  Christine Meyer is a 67 y.o. female coming in with complaint of left shoulder pain. Went to the ER Saturday night. Has xrays.  X-rays were independently visualized by me.  Was told that it was tendonitis/ rotator cuff.  No osteoarthritic changes but some mild osteopenia noted. Onset- Chronic  Location- Bicep, shoulder, back Duration- All day but afternoon is worse  Character- Achy  Reliving factors- Molxicam, tramadol Severity-7 out of 10     Past Medical History:  Diagnosis Date  . Anemia    NOS  . CAD (coronary artery disease)    Stent RCA 1999  . COPD (chronic obstructive pulmonary disease) ([place])   . Diabetes mellitus, type 2 (HCC)    Diet controlled  . Hyperlipidemia   . Hypertension   . OSA on CPAP   . PVD (peripheral vascular disease) (HCC)    Carotid stenosis, renal artery stenosis  . Sleep apnea    dx'd but doesn't use mask   . Uterine cancer ([place]) ?1025'E   Past Surgical History:  Procedure Laterality Date  . ABDOMINAL ANGIOGRAM  11/22/2014   Procedure: ABDOMINAL ANGIOGRAM;  Surgeon: Lorretta Harp, MD;  Location: [facility] CATH LAB;  Service: Cardiovascular;;  . COLONOSCOPY  2000   negative  . CORONARY ANGIOPLASTY WITH STENT PLACEMENT  1999   RCA; Dr. Melvern Banker G4 P4  . ILIAC ARTERY STENT Right 11/22/2014  . LOWER EXTREMITY ANGIOGRAM N/A 11/22/2014   Procedure: LOWER EXTREMITY ANGIOGRAM;  Surgeon: Lorretta Harp, MD; L-SFA 100%, 3v runoff, R-pCIA 95>>0% w/ 8 mm x 18 mm long Balloon expandable stent; R-dCIA 67>>0% w/ 8 mm x 4 cm long Cordis Smart Nitinol self expanding stent   . RENAL ARTERY STENT Right May 2007; 08/2006   ; restenosis  . TOTAL ABDOMINAL HYSTERECTOMY  ?1980's    for uterine CA   Social History   Socioeconomic History  . Marital status: Single    Spouse name: Not on file  . Number of  children: 4  . Years of education: Not on file  . Highest education level: Not on file  Occupational History  . Not on file  Social Needs  . Financial resource strain: Not on file  . Food insecurity:    Worry: Not on file    Inability: Not on file  . Transportation needs:    Medical: Not on file    Non-medical: Not on file  Tobacco Use  . Smoking status: Current Every Day Smoker    Packs/day: 0.60    Years: 51.00    Pack years: 30.60    Types: Cigarettes  . Smokeless tobacco: Never Used  . Tobacco comment: down to 5-6 cigarettes daily  Substance and Sexual Activity  . Alcohol use: No    Alcohol/week: 0.0 oz  . Drug use: No  . Sexual activity: Yes  Lifestyle  . Physical activity:    Days per week: Not on file    Minutes per session: Not on file  . Stress: Not on file  Relationships  . Social connections:    Talks on phone: Not on file    Gets together: Not on file    Attends religious service: Not on file    Active member of club or organization: Not on file    Attends meetings of clubs or organizations: Not on file  Relationship status: Not on file  Other Topics Concern  . Not on file  Social History Narrative   Lives at home with grandson.    No Known Allergies Family History  Problem Relation Age of Onset  . Heart failure Mother        after hip fx.   . Hypertension Mother   . Diabetes Mother   . Deep vein thrombosis Mother   . Hypertension Father   . Diabetes Sister        insulin dependent  . Hypertension Brother   . Emphysema Brother   . Diabetes Brother        insulin dependent  . Diabetes Maternal Aunt        non-insulent dependent  . Heart attack Paternal Aunt   . Diabetes Maternal Grandmother        non-insulin dependent     Past medical history, social, surgical and family history all reviewed in electronic medical record.  No pertanent information unless stated regarding to the chief complaint.   Review of Systems:Review of systems  updated and as accurate as of 01/24/18  No headache, visual changes, nausea, vomiting, diarrhea, constipation, dizziness, abdominal pain, skin rash, fevers, chills, night sweats, weight loss, swollen lymph nodes, body aches, joint swelling, muscle aches, chest pain, shortness of breath, mood changes.   Objective  Blood pressure 140/60, pulse 71, height 5\' 2"  (1.575 m), weight 144 lb (65.3 kg), SpO2 98 %. Systems examined below as of 01/24/18   General: No apparent distress alert and oriented x3 mood and affect normal, dressed appropriately.  HEENT: Pupils equal, extraocular movements intact  Respiratory: Patient's speak in full sentences and does not appear short of breath  Cardiovascular: No lower extremity edema, non tender, no erythema  Skin: Warm dry intact with no signs of infection or rash on extremities or on axial skeleton.  Abdomen: Soft nontender  Neuro: Cranial nerves II through XII are intact, neurovascularly intact in all extremities with 2+ DTRs and 2+ pulses.  Lymph: No lymphadenopathy of posterior or anterior cervical chain or axillae bilaterally.  Gait normal with good balance and coordination.  MSK:  Non tender with full range of motion and good stability and symmetric strength and tone of elbows, wrist, hip, knee and ankles bilaterally.  Shoulder: Left Inspection reveals no abnormalities, atrophy or asymmetry. Palpation is normal with no tenderness over AC joint or bicipital groove. ROM lacks last 5 degrees of internal and external rotation Rotator cuff strength 4-5 compared to the contralateral side. Positive impingement Speeds and Yergason's tests normal. No labral pathology noted with negative Obrien's, negative clunk and good stability. Positive painful arc No apprehension sign Contralateral shoulder unremarkable  MSK US performed of: left shoulder This study was ordered, performed, and interpreted by Charlann Boxer D.O.  Shoulder:   Supraspinatus: Intrasubstance  tearing noted. Subscapularis: Intrasubstance tearing noted as well AC joint:  Capsule undistended, no geyser sign. Glenohumeral Joint: Mild arthritis Glenoid Labrum:  Intact without visualized tears. Biceps Tendon:  Appears normal on long and transverse views, no fraying of tendon, tendon located in intertubercular groove, no subluxation with shoulder internal or external rotation. Impression: Intrasubstance tearing of the rotator cuff noted    Impression and Recommendations:     This case required medical decision making of moderate complexity.      Note: This dictation was prepared with Dragon dictation along with smaller phrase technology. Any transcriptional errors that result from this process are unintentional.

## 2018-01-24 NOTE — Patient Instructions (Signed)
Anemia Anemia is a condition in which you do not have enough red blood cells or hemoglobin. Hemoglobin is a substance in red blood cells that carries oxygen. When you do not have enough red blood cells or hemoglobin (are anemic), your body cannot get enough oxygen and your organs may not work properly. As a result, you may feel very tired or have other problems. What are the causes? Common causes of anemia include:  Excessive bleeding. Anemia can be caused by excessive bleeding inside or outside the body, including bleeding from the intestine or from periods in women.  Poor nutrition.  Long-lasting (chronic) kidney, thyroid, and liver disease.  Bone marrow disorders.  Cancer and treatments for cancer.  HIV (human immunodeficiency virus) and AIDS (acquired immunodeficiency syndrome).  Treatments for HIV and AIDS.  Spleen problems.  Blood disorders.  Infections, medicines, and autoimmune disorders that destroy red blood cells.  What are the signs or symptoms? Symptoms of this condition include:  Minor weakness.  Dizziness.  Headache.  Feeling heartbeats that are irregular or faster than normal (palpitations).  Shortness of breath, especially with exercise.  Paleness.  Cold sensitivity.  Indigestion.  Nausea.  Difficulty sleeping.  Difficulty concentrating.  Symptoms may occur suddenly or develop slowly. If your anemia is mild, you may not have symptoms. How is this diagnosed? This condition is diagnosed based on:  Blood tests.  Your medical history.  A physical exam.  Bone marrow biopsy.  Your health care provider may also check your stool (feces) for blood and may do additional testing to look for the cause of your bleeding. You may also have other tests, including:  Imaging tests, such as a CT scan or MRI.  Endoscopy.  Colonoscopy.  How is this treated? Treatment for this condition depends on the cause. If you continue to lose a lot of blood,  you may need to be treated at a hospital. Treatment may include:  Taking supplements of iron, vitamin B12, or folic acid.  Taking a hormone medicine (erythropoietin) that can help to stimulate red blood cell growth.  Having a blood transfusion. This may be needed if you lose a lot of blood.  Making changes to your diet.  Having surgery to remove your spleen.  Follow these instructions at home:  Take over-the-counter and prescription medicines only as told by your health care provider.  Take supplements only as told by your health care provider.  Follow any diet instructions that you were given.  Keep all follow-up visits as told by your health care provider. This is important. Contact a health care provider if:  You develop new bleeding anywhere in the body. Get help right away if:  You are very weak.  You are short of breath.  You have pain in your abdomen or chest.  You are dizzy or feel faint.  You have trouble concentrating.  You have bloody or black, tarry stools.  You vomit repeatedly or you vomit up blood. Summary  Anemia is a condition in which you do not have enough red blood cells or enough of a substance in your red blood cells that carries oxygen (hemoglobin).  Symptoms may occur suddenly or develop slowly.  If your anemia is mild, you may not have symptoms.  This condition is diagnosed with blood tests as well as a medical history and physical exam. Other tests may be needed.  Treatment for this condition depends on the cause of the anemia. This information is not intended to replace advice   given to you by your health care provider. Make sure you discuss any questions you have with your health care provider. Document Released: 10/04/2004 Document Revised: 09/28/2016 Document Reviewed: 09/28/2016 Elsevier Interactive Patient Education  [employer].

## 2018-01-25 ENCOUNTER — Other Ambulatory Visit: Payer: Self-pay | Admitting: Family Medicine

## 2018-01-25 DIAGNOSIS — I10 Essential (primary) hypertension: Secondary | ICD-10-CM

## 2018-01-25 LAB — CBC WITH DIFFERENTIAL/PLATELET
BASOS PCT: 0.6 %
Basophils Absolute: 30 cells/uL (ref 0–200)
EOS ABS: 170 {cells}/uL (ref 15–500)
Eosinophils Relative: 3.4 %
HCT: 29.3 % — ABNORMAL LOW (ref 35.0–45.0)
Hemoglobin: 9.9 g/dL — ABNORMAL LOW (ref 11.7–15.5)
Lymphs Abs: 2475 cells/uL (ref 850–3900)
MCH: 33.2 pg — AB (ref 27.0–33.0)
MCHC: 33.8 g/dL (ref 32.0–36.0)
MCV: 98.3 fL (ref 80.0–100.0)
MONOS PCT: 8.6 %
MPV: 9.4 fL (ref 7.5–12.5)
NEUTROS PCT: 37.9 %
Neutro Abs: 1895 cells/uL (ref 1500–7800)
PLATELETS: 222 10*3/uL (ref 140–400)
RBC: 2.98 10*6/uL — ABNORMAL LOW (ref 3.80–5.10)
RDW: 19.7 % — AB (ref 11.0–15.0)
TOTAL LYMPHOCYTE: 49.5 %
WBC mixed population: 430 cells/uL (ref 200–950)
WBC: 5 10*3/uL (ref 3.8–10.8)

## 2018-01-25 LAB — IRON,TIBC AND FERRITIN PANEL
%SAT: 25 % (calc) (ref 11–50)
FERRITIN: 153 ng/mL (ref 20–288)
IRON: 57 ug/dL (ref 45–160)
TIBC: 232 mcg/dL (calc) — ABNORMAL LOW (ref 250–450)

## 2018-01-25 LAB — VITAMIN D 25 HYDROXY (VIT D DEFICIENCY, FRACTURES): Vit D, 25-Hydroxy: 34 ng/mL (ref 30–100)

## 2018-01-26 DIAGNOSIS — M8580 Other specified disorders of bone density and structure, unspecified site: Secondary | ICD-10-CM | POA: Insufficient documentation

## 2018-01-26 DIAGNOSIS — R1013 Epigastric pain: Secondary | ICD-10-CM | POA: Insufficient documentation

## 2018-01-26 NOTE — Assessment & Plan Note (Addendum)
Check ifob Recheck labs Consider GI referral

## 2018-01-26 NOTE — Assessment & Plan Note (Signed)
Zantac bid GI referral

## 2018-01-28 ENCOUNTER — Encounter: Payer: Self-pay | Admitting: Family Medicine

## 2018-01-28 DIAGNOSIS — D631 Anemia in chronic kidney disease: Secondary | ICD-10-CM

## 2018-01-28 DIAGNOSIS — N189 Chronic kidney disease, unspecified: Principal | ICD-10-CM

## 2018-01-29 ENCOUNTER — Other Ambulatory Visit (INDEPENDENT_AMBULATORY_CARE_PROVIDER_SITE_OTHER): Payer: Medicare Other

## 2018-01-29 ENCOUNTER — Other Ambulatory Visit: Payer: Self-pay | Admitting: Family Medicine

## 2018-01-29 DIAGNOSIS — D649 Anemia, unspecified: Secondary | ICD-10-CM | POA: Diagnosis not present

## 2018-01-29 DIAGNOSIS — D508 Other iron deficiency anemias: Secondary | ICD-10-CM

## 2018-01-29 LAB — FECAL OCCULT BLOOD, IMMUNOCHEMICAL: Fecal Occult Bld: NEGATIVE

## 2018-01-29 NOTE — Telephone Encounter (Signed)
Viewed by [name] on 01/29/2018 12:28 AM  Written by Carollee Herter, Alferd Apa, DO on 01/28/2018 6:26 PM  Vita D low normal--- ok to take vita d 3 1000u daily otc   + anemic---start multivitamin with iron again---- recheck in 1 month  Cbcd, b12, folate/ thiamine

## 2018-01-30 ENCOUNTER — Other Ambulatory Visit: Payer: Self-pay

## 2018-01-30 ENCOUNTER — Emergency Department (HOSPITAL_BASED_OUTPATIENT_CLINIC_OR_DEPARTMENT_OTHER)
Admission: EM | Admit: 2018-01-30 | Discharge: 2018-01-30 | Disposition: A | Discharge status: Home / Self Care | Payer: Medicare Other | Attending: Emergency Medicine | Admitting: Emergency Medicine

## 2018-01-30 ENCOUNTER — Encounter (HOSPITAL_BASED_OUTPATIENT_CLINIC_OR_DEPARTMENT_OTHER): Payer: Self-pay

## 2018-01-30 ENCOUNTER — Emergency Department (HOSPITAL_BASED_OUTPATIENT_CLINIC_OR_DEPARTMENT_OTHER): Payer: Medicare Other

## 2018-01-30 ENCOUNTER — Other Ambulatory Visit: Payer: Self-pay | Admitting: Family Medicine

## 2018-01-30 DIAGNOSIS — R1033 Periumbilical pain: Secondary | ICD-10-CM | POA: Diagnosis not present

## 2018-01-30 DIAGNOSIS — Z955 Presence of coronary angioplasty implant and graft: Secondary | ICD-10-CM | POA: Diagnosis not present

## 2018-01-30 DIAGNOSIS — I10 Essential (primary) hypertension: Secondary | ICD-10-CM | POA: Diagnosis not present

## 2018-01-30 DIAGNOSIS — E119 Type 2 diabetes mellitus without complications: Secondary | ICD-10-CM | POA: Insufficient documentation

## 2018-01-30 DIAGNOSIS — F17210 Nicotine dependence, cigarettes, uncomplicated: Secondary | ICD-10-CM | POA: Diagnosis not present

## 2018-01-30 DIAGNOSIS — I2510 Atherosclerotic heart disease of native coronary artery without angina pectoris: Secondary | ICD-10-CM | POA: Diagnosis not present

## 2018-01-30 DIAGNOSIS — R197 Diarrhea, unspecified: Secondary | ICD-10-CM | POA: Diagnosis not present

## 2018-01-30 DIAGNOSIS — J449 Chronic obstructive pulmonary disease, unspecified: Secondary | ICD-10-CM | POA: Diagnosis not present

## 2018-01-30 DIAGNOSIS — Z79899 Other long term (current) drug therapy: Secondary | ICD-10-CM | POA: Diagnosis not present

## 2018-01-30 DIAGNOSIS — K920 Hematemesis: Secondary | ICD-10-CM

## 2018-01-30 DIAGNOSIS — R1110 Vomiting, unspecified: Secondary | ICD-10-CM | POA: Diagnosis not present

## 2018-01-30 DIAGNOSIS — R1010 Upper abdominal pain, unspecified: Secondary | ICD-10-CM | POA: Diagnosis not present

## 2018-01-30 DIAGNOSIS — K625 Hemorrhage of anus and rectum: Secondary | ICD-10-CM

## 2018-01-30 DIAGNOSIS — R109 Unspecified abdominal pain: Secondary | ICD-10-CM | POA: Diagnosis not present

## 2018-01-30 LAB — COMPREHENSIVE METABOLIC PANEL
ALBUMIN: 4.3 g/dL (ref 3.5–5.0)
ALT: 20 U/L (ref 14–54)
AST: 25 U/L (ref 15–41)
Alkaline Phosphatase: 59 U/L (ref 38–126)
Anion gap: 11 (ref 5–15)
BILIRUBIN TOTAL: 0.7 mg/dL (ref 0.3–1.2)
BUN: 10 mg/dL (ref 6–20)
CHLORIDE: 104 mmol/L (ref 101–111)
CO2: 22 mmol/L (ref 22–32)
Calcium: 8.9 mg/dL (ref 8.9–10.3)
Creatinine, Ser: 1.12 mg/dL — ABNORMAL HIGH (ref 0.44–1.00)
GFR calc Af Amer: 57 mL/min — ABNORMAL LOW (ref >60)
GFR calc non Af Amer: 49 mL/min — ABNORMAL LOW (ref >60)
GLUCOSE: 124 mg/dL — AB (ref 65–99)
POTASSIUM: 3.6 mmol/L (ref 3.5–5.1)
Sodium: 137 mmol/L (ref 135–145)
TOTAL PROTEIN: 7.6 g/dL (ref 6.5–8.1)

## 2018-01-30 LAB — CBC WITH DIFFERENTIAL/PLATELET
BASOS ABS: 0 10*3/uL (ref 0.0–0.1)
Basophils Relative: 0 %
Eosinophils Absolute: 0.1 10*3/uL (ref 0.0–0.7)
Eosinophils Relative: 2 %
HEMATOCRIT: 34.1 % — AB (ref 36.0–46.0)
HEMOGLOBIN: 12.2 g/dL (ref 12.0–15.0)
LYMPHS PCT: 21 %
Lymphs Abs: 1.5 10*3/uL (ref 0.7–4.0)
MCH: 35.5 pg — ABNORMAL HIGH (ref 26.0–34.0)
MCHC: 35.8 g/dL (ref 30.0–36.0)
MCV: 99.1 fL (ref 78.0–100.0)
Monocytes Absolute: 0.4 10*3/uL (ref 0.1–1.0)
Monocytes Relative: 6 %
NEUTROS ABS: 4.9 10*3/uL (ref 1.7–7.7)
NEUTROS PCT: 71 %
PLATELETS: 261 10*3/uL (ref 150–400)
RBC: 3.44 MIL/uL — ABNORMAL LOW (ref 3.87–5.11)
RDW: 18.7 % — AB (ref 11.5–15.5)
WBC: 7 10*3/uL (ref 4.0–10.5)

## 2018-01-30 LAB — URINALYSIS, ROUTINE W REFLEX MICROSCOPIC
Bilirubin Urine: NEGATIVE
GLUCOSE, UA: NEGATIVE mg/dL
Hgb urine dipstick: NEGATIVE
KETONES UR: 15 mg/dL — AB
LEUKOCYTES UA: NEGATIVE
NITRITE: NEGATIVE
PH: 6 (ref 5.0–8.0)
PROTEIN: NEGATIVE mg/dL
Specific Gravity, Urine: 1.02 (ref 1.005–1.030)

## 2018-01-30 LAB — LIPASE, BLOOD: LIPASE: 32 U/L (ref 11–51)

## 2018-01-30 MED ORDER — IOPAMIDOL (ISOVUE-300) INJECTION 61%
30.0000 mL | Freq: Once | INTRAVENOUS | Status: AC | PRN
  Administered 2018-01-30: 30 mL via ORAL

## 2018-01-30 MED ORDER — ONDANSETRON 4 MG PO TBDP: ORAL_TABLET | ORAL | 0 refills | Status: DC

## 2018-01-30 MED ORDER — FENTANYL CITRATE (PF) 100 MCG/2ML IJ SOLN
50.0000 ug | Freq: Once | INTRAMUSCULAR | Status: AC
  Administered 2018-01-30: 50 ug via INTRAVENOUS
  Filled 2018-01-30: qty 2

## 2018-01-30 MED ORDER — IOPAMIDOL (ISOVUE-300) INJECTION 61%
100.0000 mL | Freq: Once | INTRAVENOUS | Status: AC | PRN
  Administered 2018-01-30: 100 mL via INTRAVENOUS

## 2018-01-30 NOTE — ED Notes (Signed)
Pt still unable to provide urine sample 

## 2018-01-30 NOTE — ED Triage Notes (Signed)
Pt with abd pain, n/v/d x 4 days-pt with blood in emesis and diarrhea today-pt with recent dx of anemia-NAD-slow gait

## 2018-01-30 NOTE — ED Provider Notes (Signed)
[facility] EMERGENCY DEPARTMENT Provider Note   CSN: [id number] Arrival date & time: 01/30/18  1157     History   Chief Complaint Chief Complaint  Patient presents with  . Abdominal Pain    HPI Christine Meyer is a 67 y.o. female.  Patient presents today for evaluation of abdominal pain. Patient reports she developed nausea, vomiting, and diarrhea on Monday evening which resolved the next day. On Wednesday, she developed diffuse pain across the abdomen in a plane across the umbilicus. This morning, she arose and consumed grits and a partial cup of coffee. After arriving at her part-time job, she started vomiting again, and noted blood streaks in vomitus along with several small clots. The diarrhea also returned. She denies dark or tarry looking stools. She was recently evaluated by her PCP for anemia found in an ED visit on 01/19/18--her hemoccult was negative, and she was prescribed ranitidine (which she has not started) with plan to follow-up with GI. She denies fevers or chills, but does state that she feels cold all the time. Today's labs show improvement in anemia.   Abdominal Pain   This is a new problem. The current episode started 2 days ago. The problem has been gradually worsening. The pain is associated with an unknown factor. The pain is located in the periumbilical region. The pain is mild. Associated symptoms include diarrhea, nausea and vomiting.    Past Medical History:  Diagnosis Date  . Anemia    NOS  . CAD (coronary artery disease)    Stent RCA 1999  . COPD (chronic obstructive pulmonary disease) ([place])   . Diabetes mellitus, type 2 (HCC)    Diet controlled  . Hyperlipidemia   . Hypertension   . OSA on CPAP   . PVD (peripheral vascular disease) (HCC)    Carotid stenosis, renal artery stenosis  . Sleep apnea    dx'd but doesn't use mask   . Uterine cancer ([place]) ?8325'Q    Patient Active Problem List   Diagnosis Date Noted  . Osteopenia  01/26/2018  . Dyspepsia 01/26/2018  . Rotator cuff tear, left 01/24/2018  . COPD (chronic obstructive pulmonary disease) ([place]) 07/26/2017  . Weight loss 04/22/2017  . Cough 04/22/2017  . Current every day smoker 04/22/2017  . Bilateral carotid artery disease ([place]) 07/04/2016  . Claudication ([place]) 11/22/2014  . Peripheral arterial disease ([place]) 10/22/2014  . Renal artery stenosis, native, bilateral ([place]) 10/22/2014  . Centrilobular emphysema ([place]) 10/19/2014  . 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  . Dyspnea 08/24/2014  . Pain in joint, lower leg 08/24/2014  . Dyslipidemia 03/12/2011  . Coronary atherosclerosis 09/19/2010  . GERD 02/23/2010  . NONDEPENDENT TOBACCO USE DISORDER 10/14/2009  . EDEMA- LOCALIZED 04/06/2008  . CAROTID BRUITS, BILATERAL 04/06/2008  . AMI 10/09/2007  . RENAL INSUFFICIENCY 10/09/2007  . DM (diabetes mellitus) type II controlled peripheral vascular disorder 07/07/2007  . ANEMIA-NOS 07/07/2007  . Essential hypertension 07/07/2007  . HX, PERSONAL, MALIGNANCY, UTERUS NEC 07/07/2007    Past Surgical History:  Procedure Laterality Date  . ABDOMINAL ANGIOGRAM  11/22/2014   Procedure: ABDOMINAL ANGIOGRAM;  Surgeon: Lorretta Harp, MD;  [facility];  Service: Cardiovascular;;  . COLONOSCOPY  2000   negative  . CORONARY ANGIOPLASTY WITH STENT PLACEMENT  1999   RCA; Dr. Melvern Banker G4 P4  . ILIAC ARTERY STENT Right 11/22/2014  . LOWER EXTREMITY ANGIOGRAM N/A 11/22/2014   Procedure: LOWER EXTREMITY ANGIOGRAM;  Surgeon: Lorretta Harp, MD; L-SFA 100%, 3v  runoff, R-pCIA 95>>0% w/ 8 mm x 18 mm long Balloon expandable stent; R-dCIA 67>>0% w/ 8 mm x 4 cm long Cordis Smart Nitinol self expanding stent   . RENAL ARTERY STENT Right May 2007; 08/2006   ; restenosis  . TOTAL ABDOMINAL HYSTERECTOMY  ?1980's    for uterine CA     OB History   None      Home Medications    Prior to Admission medications   Medication Sig Start Date End Date Taking?  Authorizing Provider  ACCU-CHEK SOFTCLIX LANCETS lancets Check blood sugar once daily. Dx:E11.9 04/01/15   Roma Schanz R, DO  alendronate (FOSAMAX) 70 MG tablet TAKE 1 TABLET BY MOUTH ONCE A WEEK. TAKE WITH A FULL  GLASS OF WATER ON AN EMPTY  STOMACH. 04/29/17   Carollee Herter, Alferd Apa, DO  aspirin EC 81 MG tablet Take 1 tablet (81 mg total) by mouth daily. 03/20/17   Minus Breeding, MD  atorvastatin (LIPITOR) 40 MG tablet TAKE 1 TABLET BY MOUTH  DAILY 05/03/17   Carollee Herter, Alferd Apa, DO  benzonatate (TESSALON) 200 MG capsule Take 1 capsule (200 mg total) by mouth 3 (three) times daily as needed for cough. 06/25/17   Juanito Doom, MD  Blood Glucose Monitoring Suppl (ACCU-CHEK AVIVA PLUS) W/DEVICE KIT Check Blood sugar daily. Dx E11.9 04/01/15   Carollee Herter, Alferd Apa, DO  calcium carbonate (OS-CAL - DOSED IN MG OF ELEMENTAL CALCIUM) 1250 (500 Ca) MG tablet Take 1 tablet (500 mg of elemental calcium total) by mouth daily with breakfast. 04/22/17   Carollee Herter, Alferd Apa, DO  Cholecalciferol (VITAMIN D) 2000 units CAPS Take 1 capsule (2,000 Units total) by mouth daily. 11/09/16   Ann Held, DO  Cholecalciferol (VITAMIN D3) 2000 units capsule Take 1 capsule (2,000 Units total) by mouth daily. 01/24/18   Ann Held, DO  clopidogrel (PLAVIX) 75 MG tablet TAKE 1 TABLET(75 MG) BY MOUTH DAILY 10/17/17   Minus Breeding, MD  docusate sodium (COLACE) 100 MG capsule Take 1 capsule (100 mg total) by mouth every 12 (twelve) hours. 01/19/18   Ward, Delice Bison, DO  IRON PO Take 1 tablet by mouth daily.    [provider]  meloxicam (MOBIC) 7.5 MG tablet TAKE 1 TABLET(7.5 MG) BY MOUTH DAILY 09/20/17   Carollee Herter, Kendrick Fries R, DO  metoprolol succinate (TOPROL-XL) 25 MG 24 hr tablet TAKE 1 TABLET BY MOUTH DAILY 01/27/18   Carollee Herter, Alferd Apa, DO  Multiple Vitamins-Minerals (A THRU Z SELECT 50+ ADVANCED) TABS TAKE 1 TABLET BY MOUTH DAILY 05/17/16   Carollee Herter, Alferd Apa, DO  olmesartan  (BENICAR) 20 MG tablet TAKE 1 TABLET(20 MG) BY MOUTH DAILY 11/14/17   Carollee Herter, Alferd Apa, DO  ONETOUCH VERIO test strip USE TO TEST BLOOD SUGAR ONCE DAILY 08/15/17   Copland, Gay Filler, MD  Pirfenidone (ESBRIET) 267 MG CAPS TAKE 2 CAPSULES BY MOUTH THREE TIMES A DAY Patient taking differently: TAKE 1 CAPSULE BY MOUTH THREE TIMES A DAY 03/26/17   Juanito Doom, MD  ranitidine (ZANTAC) 150 MG tablet Take 1 tablet (150 mg total) by mouth 2 (two) times daily. 01/24/18   Roma Schanz R, DO  spironolactone (ALDACTONE) 25 MG tablet TAKE 1 TABLET(25 MG) BY MOUTH DAILY 01/27/18   Carollee Herter, Alferd Apa, DO  SYMBICORT 160-4.5 MCG/ACT inhaler INHALE 2 PUFFS INTO THE LUNGS TWICE DAILY 08/13/17   Juanito Doom, MD  traMADol (ULTRAM) 50 MG  tablet Take 1 tablet (50 mg total) by mouth every 6 (six) hours as needed. 01/19/18   Ward, Delice Bison, DO    Family History Family History  Problem Relation Age of Onset  . Heart failure Mother        after hip fx.   . Hypertension Mother   . Diabetes Mother   . Deep vein thrombosis Mother   . Hypertension Father   . Diabetes Sister        insulin dependent  . Hypertension Brother   . Emphysema Brother   . Diabetes Brother        insulin dependent  . Diabetes Maternal Aunt        non-insulent dependent  . Heart attack Paternal Aunt   . Diabetes Maternal Grandmother        non-insulin dependent    Social History Social History   Tobacco Use  . Smoking status: Current Every Day Smoker    Packs/day: 0.60    Years: 51.00    Pack years: 30.60    Types: Cigarettes  . Smokeless tobacco: Never Used  . Tobacco comment: down to 5-6 cigarettes daily  Substance Use Topics  . Alcohol use: No    Alcohol/week: 0.0 oz  . Drug use: No     Allergies   Patient has no known allergies.   Review of Systems Review of Systems  Constitutional: Positive for fatigue.  Gastrointestinal: Positive for abdominal pain, diarrhea, nausea and vomiting.  All  other systems reviewed and are negative.    Physical Exam Updated Vital Signs BP (!) 241/76 (BP Location: Right Arm)   Pulse 75   Temp 98.4 F (36.9 C) (Oral)   Resp 20   Ht 5' 2"  (1.575 m)   Wt 61.7 kg (136 lb)   SpO2 100%   BMI 24.87 kg/m   Physical Exam  Constitutional: She is oriented to person, place, and time. She appears well-developed and well-nourished.  HENT:  Head: Normocephalic.  Eyes: Conjunctivae are normal.  Neck: Neck supple.  Cardiovascular: Normal rate and regular rhythm.  Pulmonary/Chest: Effort normal.  Abdominal: Soft. Normal appearance. She exhibits no distension. Bowel sounds are decreased.  Musculoskeletal: She exhibits no edema or deformity.  Neurological: She is alert and oriented to person, place, and time.  Skin: Skin is warm and dry.  Psychiatric: She has a normal mood and affect.  Nursing note and vitals reviewed.    ED Treatments / Results  Labs (all labs ordered are listed, but only abnormal results are displayed) Labs Reviewed  CBC WITH DIFFERENTIAL/PLATELET - Abnormal; Notable for the following components:      Result Value   RBC 3.44 (*)    HCT 34.1 (*)    MCH 35.5 (*)    RDW 18.7 (*)    All other components within normal limits  COMPREHENSIVE METABOLIC PANEL - Abnormal; Notable for the following components:   Glucose, Bld 124 (*)    Creatinine, Ser 1.12 (*)    GFR calc non Af Amer 49 (*)    GFR calc Af Amer 57 (*)    All other components within normal limits  LIPASE, BLOOD  URINALYSIS, ROUTINE W REFLEX MICROSCOPIC    EKG None  Radiology Ct Abdomen Pelvis W Contrast  Result Date: 01/30/2018 CLINICAL DATA:  Diffuse abdominal pain over the last 4 days with nausea, vomiting, and diarrhea, bloody stool EXAM: CT ABDOMEN AND PELVIS WITH CONTRAST TECHNIQUE: Multidetector CT imaging of the abdomen and pelvis was  performed using the standard protocol following bolus administration of intravenous contrast. CONTRAST:  59m  ISOVUE-300 IOPAMIDOL (ISOVUE-300) INJECTION 61%, 1059mISOVUE-300 IOPAMIDOL (ISOVUE-300) INJECTION 61% COMPARISON:  None. FINDINGS: Lower chest: Fibrotic changes are present at the lung bases. No pneumonia or effusion is seen. The heart is mildly enlarged. Hepatobiliary: The liver enhances with no focal abnormality and no ductal dilatation is seen. No definite gallstones are seen within the gallbladder. Pancreas: The pancreas is normal in size and the pancreatic duct is not dilated. Spleen: The spleen is unremarkable. Adrenals/Urinary Tract: The adrenal glands appear normal. The left kidney appears compensatory hypertrophy and enhances. No left renal calculi or hydronephrosis is seen. Right kidney is not enhance and appears very atrophic, most likely nonfunctional. No hydronephrosis is seen. The ureters are normal in caliber. The urinary bladder is not well distended but no abnormality is seen. Stomach/Bowel: The stomach is moderately well distended with oral contrast material. The proximal small bowel is unremarkable. However, within the ileum in the pelvis, there is a loop of small bowel the demonstrates mucosal edema. The terminal ileum appears unremarkable. These findings are nonspecific but could indicate gastroenteritis, or inflammatory bowel disease such as Crohn's. No compromise of the SMA vasculature is seen although there are significant changes of abdominal aortic atherosclerosis. The origin of the SMA does appear to be somewhat narrowed by plaque formation. Colon is largely decompressed. The appendix is not well seen but no inflammatory process is noted within the right lower quadrant. Vascular/Lymphatic: Significant abdominal aortic atherosclerotic change is present extending into the iliac arteries bilaterally. Reproductive: The uterus has been resected previously reportedly due to history of uterine carcinoma. No adnexal lesion is seen. No fluid is noted within the pelvis Other: No abdominal wall  hernia is seen. Musculoskeletal: The lumbar vertebrae are in normal alignment with mild degenerative disc disease particularly at L4-5. There also are degenerative changes throughout the facet joints of the lower lumbar spine. IMPRESSION: 1. There is a single loop of small bowel in the distal ileum in the pelvis with thickened mucosa most consistent with edema. This pattern is nonspecific but can be seen with gastroenteritis or possibly inflammatory bowel disease such as Crohn's. It seems unlikely that this is due to radiation change with the hysterectomy performed in the 1980s. 2. Atrophy of the right kidney which appears nonfunctional. Compensatory hypertrophy of the left kidney. No hydronephrosis. 3. Considerable abdominal aortic atherosclerosis extending into the mesenteric vessels and iliac arteries bilaterally. No definite occlusion of the SMA is seen. 4. Fibrotic changes at the lung bases. Electronically Signed   By: PaIvar Drape.D.   On: 01/30/2018 17:02    Procedures Procedures (including critical care time)  Medications Ordered in ED Medications - No data to display   Initial Impression / Assessment and Plan / ED Course  I have reviewed the triage vital signs and the nursing notes.  Pertinent labs & imaging results that were available during my care of the patient were reviewed by me and considered in my medical decision making (see chart for details).    2:42 PM Patient discussed with Dr. PfJohnney KillianWill add CT A/P to evaluation.   CT findings discussed with patient, and patient discussed with Dr. TeSherry Ruffingrior to discharge.  Patient is nontoxic, nonseptic appearing, in no apparent distress.  Patient's pain and other symptoms adequately managed in emergency department.   Labs, imaging and vitals reviewed.  Patient does not meet the SIRS or Sepsis criteria.  On repeat exam  patient does not have a surgical abdomen and there are no peritoneal signs.  No indication of appendicitis, bowel  obstruction, bowel perforation, cholecystitis, diverticulitis.  Patient discharged home with symptomatic treatment and given strict instructions for follow-up with their primary care physician.  I have also discussed reasons to return immediately to the ER.  Patient expresses understanding and agrees with plan.    Final Clinical Impressions(s) / ED Diagnoses   Final diagnoses:  Pain of upper abdomen    ED Discharge Orders    None       Etta Quill, NP 01/30/18 [id number]    Charlesetta Shanks, MD 02/07/18 [phone]

## 2018-01-31 ENCOUNTER — Encounter: Payer: Self-pay | Admitting: Gastroenterology

## 2018-02-04 ENCOUNTER — Other Ambulatory Visit: Payer: Self-pay | Admitting: Family Medicine

## 2018-02-04 DIAGNOSIS — E785 Hyperlipidemia, unspecified: Secondary | ICD-10-CM

## 2018-02-04 MED ORDER — ATORVASTATIN CALCIUM 40 MG PO TABS: 40.0000 mg | ORAL_TABLET | Freq: Every day | ORAL | 1 refills | Status: DC

## 2018-02-07 ENCOUNTER — Other Ambulatory Visit: Payer: Self-pay | Admitting: Family Medicine

## 2018-02-12 ENCOUNTER — Ambulatory Visit: Payer: Medicare Other | Admitting: Family Medicine

## 2018-02-12 NOTE — Progress Notes (Signed)
[facility] [place], [place] [address] Phone: [phone] Subjective:       CC: Shoulder pain follow-up  NTI:[id number]  Christine Meyer is a 67 y.o. female coming in with complaint of left shoulder pain.  Found to have a left rotator cuff tear.  Like to try conservative therapy with home exercise, icing regimen, topical anti-inflammatories.  Patient states no improvement.  Still having severe pain.     Past Medical History:  Diagnosis Date  . Anemia    NOS  . CAD (coronary artery disease)    Stent RCA 1999  . COPD (chronic obstructive pulmonary disease) ([place])   . Diabetes mellitus, type 2 (HCC)    Diet controlled  . Hyperlipidemia   . Hypertension   . OSA on CPAP   . PVD (peripheral vascular disease) (HCC)    Carotid stenosis, renal artery stenosis  . Sleep apnea    dx'd but doesn't use mask   . Uterine cancer ([place]) ?6761'P   Past Surgical History:  Procedure Laterality Date  . ABDOMINAL ANGIOGRAM  11/22/2014   Procedure: ABDOMINAL ANGIOGRAM;  Surgeon: Lorretta Harp, MD;  Location: [facility] CATH LAB;  Service: Cardiovascular;;  . COLONOSCOPY  2000   negative  . CORONARY ANGIOPLASTY WITH STENT PLACEMENT  1999   RCA; Dr. Melvern Banker G4 P4  . ILIAC ARTERY STENT Right 11/22/2014  . LOWER EXTREMITY ANGIOGRAM N/A 11/22/2014   Procedure: LOWER EXTREMITY ANGIOGRAM;  Surgeon: Lorretta Harp, MD; L-SFA 100%, 3v runoff, R-pCIA 95>>0% w/ 8 mm x 18 mm long Balloon expandable stent; R-dCIA 67>>0% w/ 8 mm x 4 cm long Cordis Smart Nitinol self expanding stent   . RENAL ARTERY STENT Right May 2007; 08/2006   ; restenosis  . TOTAL ABDOMINAL HYSTERECTOMY  ?1980's    for uterine CA   Social History   Socioeconomic History  . Marital status: Single    Spouse name: Not on file  . Number of children: 4  . Years of education: Not on file  . Highest education level: Not on file  Occupational History  . Not on file  Social Needs  . Financial  resource strain: Not on file  . Food insecurity:    Worry: Not on file    Inability: Not on file  . Transportation needs:    Medical: Not on file    Non-medical: Not on file  Tobacco Use  . Smoking status: Current Every Day Smoker    Packs/day: 0.60    Years: 51.00    Pack years: 30.60    Types: Cigarettes  . Smokeless tobacco: Never Used  . Tobacco comment: down to 5-6 cigarettes daily  Substance and Sexual Activity  . Alcohol use: No    Alcohol/week: 0.0 oz  . Drug use: No  . Sexual activity: Yes  Lifestyle  . Physical activity:    Days per week: Not on file    Minutes per session: Not on file  . Stress: Not on file  Relationships  . Social connections:    Talks on phone: Not on file    Gets together: Not on file    Attends religious service: Not on file    Active member of club or organization: Not on file    Attends meetings of clubs or organizations: Not on file    Relationship status: Not on file  Other Topics Concern  . Not on file  Social History Narrative   Lives at  home with grandson.    No Known Allergies Family History  Problem Relation Age of Onset  . Heart failure Mother        after hip fx.   . Hypertension Mother   . Diabetes Mother   . Deep vein thrombosis Mother   . Hypertension Father   . Diabetes Sister        insulin dependent  . Hypertension Brother   . Emphysema Brother   . Diabetes Brother        insulin dependent  . Diabetes Maternal Aunt        non-insulent dependent  . Heart attack Paternal Aunt   . Diabetes Maternal Grandmother        non-insulin dependent  . Breast cancer Daughter      Past medical history, social, surgical and family history all reviewed in electronic medical record.  No pertanent information unless stated regarding to the chief complaint.   Review of Systems:Review of systems updated and as accurate as of 02/13/18  No headache, visual changes, nausea, vomiting, diarrhea, constipation, dizziness, abdominal  pain, skin rash, fevers, chills, night sweats, weight loss, swollen lymph nodes, body aches, joint swelling, muscle aches, chest pain, shortness of breath, mood changes.   Objective  Blood pressure (!) 150/50, pulse (!) 49, height 5\' 2"  (1.575 m), weight 134 lb (60.8 kg), SpO2 96 %. Systems examined below as of 02/13/18   General: No apparent distress alert and oriented x3 mood and affect normal, dressed appropriately.  HEENT: Pupils equal, extraocular movements intact  Respiratory: Patient's speak in full sentences and does not appear short of breath  Cardiovascular: No lower extremity edema, non tender, no erythema  Skin: Warm dry intact with no signs of infection or rash on extremities or on axial skeleton.  Abdomen: Soft nontender  Neuro: Cranial nerves II through XII are intact, neurovascularly intact in all extremities with 2+ DTRs and 2+ pulses.  Lymph: No lymphadenopathy of posterior or anterior cervical chain or axillae bilaterally.  Gait normal with good balance and coordination.  MSK:  Non tender with full range of motion and good stability and symmetric strength and tone of  elbows, wrist, hip, knee and ankles bilaterally.  Arthritic changes of multiple joints Left shoulder does show some crepitus still noted.  Some mild limited range of motion in all planes.  4 out of 5 strength which is an improvement from previous exam.  Increasing kyphosis noted.  Contralateral shoulder unremarkable  Procedure: Real-time Ultrasound Guided Injection of left glenohumeral joint Device: GE Logiq E  Ultrasound guided injection is preferred based studies that show increased duration, increased effect, greater accuracy, decreased procedural pain, increased response rate with ultrasound guided versus blind injection.  Verbal informed consent obtained.  Time-out conducted.  Noted no overlying erythema, induration, or other signs of local infection.  Skin prepped in a sterile fashion.  Local  anesthesia: Topical Ethyl chloride.  With sterile technique and under real time ultrasound guidance:  Joint visualized.  21g 2 inch needle inserted posterior approach. Pictures taken for needle placement. Patient did have injection of 2 cc of 0.5% Marcaine, and 1cc of Kenalog 40 mg/dL. Completed without difficulty  Pain immediately resolved suggesting accurate placement of the medication.  Advised to call if fevers/chills, erythema, induration, drainage, or persistent bleeding.  Images permanently stored and available for review in the ultrasound unit.  Impression: Technically successful ultrasound guided injection.     Impression and Recommendations:     This case required  medical decision making of moderate complexity.      Note: This dictation was prepared with Dragon dictation along with smaller phrase technology. Any transcriptional errors that result from this process are unintentional.

## 2018-02-13 ENCOUNTER — Telehealth: Payer: Self-pay | Admitting: Emergency Medicine

## 2018-02-13 ENCOUNTER — Ambulatory Visit (INDEPENDENT_AMBULATORY_CARE_PROVIDER_SITE_OTHER): Payer: Medicare Other | Admitting: Family Medicine

## 2018-02-13 ENCOUNTER — Ambulatory Visit (INDEPENDENT_AMBULATORY_CARE_PROVIDER_SITE_OTHER): Payer: Medicare Other | Admitting: Gastroenterology

## 2018-02-13 ENCOUNTER — Encounter: Payer: Self-pay | Admitting: Gastroenterology

## 2018-02-13 ENCOUNTER — Ambulatory Visit: Payer: Self-pay

## 2018-02-13 ENCOUNTER — Encounter: Payer: Self-pay | Admitting: Family Medicine

## 2018-02-13 VITALS — BP 140/50 | HR 76 | Ht 61.5 in | Wt 134.2 lb

## 2018-02-13 VITALS — BP 150/50 | HR 49 | Ht 62.0 in | Wt 134.0 lb

## 2018-02-13 DIAGNOSIS — Z7902 Long term (current) use of antithrombotics/antiplatelets: Secondary | ICD-10-CM | POA: Diagnosis not present

## 2018-02-13 DIAGNOSIS — M75112 Incomplete rotator cuff tear or rupture of left shoulder, not specified as traumatic: Secondary | ICD-10-CM

## 2018-02-13 DIAGNOSIS — Z1211 Encounter for screening for malignant neoplasm of colon: Secondary | ICD-10-CM | POA: Diagnosis not present

## 2018-02-13 DIAGNOSIS — K920 Hematemesis: Secondary | ICD-10-CM

## 2018-02-13 DIAGNOSIS — K625 Hemorrhage of anus and rectum: Secondary | ICD-10-CM | POA: Insufficient documentation

## 2018-02-13 DIAGNOSIS — M25512 Pain in left shoulder: Secondary | ICD-10-CM

## 2018-02-13 DIAGNOSIS — D649 Anemia, unspecified: Secondary | ICD-10-CM | POA: Diagnosis not present

## 2018-02-13 NOTE — Patient Instructions (Signed)
Good to see you  [facility] is your friend [facility]  Stay active See me again in 4-6 weeks

## 2018-02-13 NOTE — Progress Notes (Addendum)
02/13/2018 Christine Meyer [id number] 1951-08-23   HISTORY OF PRESENT ILLNESS:  This is a 67 year old female who is new to our office.  Requesting Dr. Carlean Purl as GI MD as that is who sees her daughter, [name] (CMA in primary care) as well.  Anyway, she is here for a follow-up after a recent ED visit.  Presented to the ED on 01/30/18 with complaints of abdominal pain, nausea, vomiting, and hematemesis.  She had a CT scan of the abdomen and pelvis with contrast that showed the following:    IMPRESSION: 1. There is a single loop of small bowel in the distal ileum in the pelvis with thickened mucosa most consistent with edema. This pattern is nonspecific but can be seen with gastroenteritis or possibly inflammatory bowel disease such as Crohn's. It seems unlikely that this is due to radiation change with the hysterectomy performed in the 1980s. 2. Atrophy of the right kidney which appears nonfunctional. Compensatory hypertrophy of the left kidney. No hydronephrosis. 3. Considerable abdominal aortic atherosclerosis extending into the mesenteric vessels and iliac arteries bilaterally. No definite occlusion of the SMA is seen. 4. Fibrotic changes at the lung bases.  She reports that she had sudden onset abdominal pain and vomiting.  After vomiting on several occassions she had one episode where she vomited some blood.  Has not had any recurrence of that since that time.    Has been anemic as well, Hgb in 9-10 gram range.  MCV is normal and was high previously.  Iron studies are actually normal.  Has mobic in her medication list but takes it rarely.    Is on plavix that is prescribed by Dr. Gwenlyn Found for history of CAD with stent.   Past Medical History:  Diagnosis Date  . Anemia    NOS  . CAD (coronary artery disease)    Stent RCA 1999  . COPD (chronic obstructive pulmonary disease) ([place])   . Diabetes mellitus, type 2 (HCC)    Diet controlled  . Hyperlipidemia   . Hypertension   . OSA on  CPAP   . PVD (peripheral vascular disease) (HCC)    Carotid stenosis, renal artery stenosis  . Sleep apnea    dx'd but doesn't use mask   . Uterine cancer ([place]) ?5638'L   Past Surgical History:  Procedure Laterality Date  . ABDOMINAL ANGIOGRAM  11/22/2014   Procedure: ABDOMINAL ANGIOGRAM;  Surgeon: Lorretta Harp, MD;  Location: [facility] CATH LAB;  Service: Cardiovascular;;  . COLONOSCOPY  2000   negative  . CORONARY ANGIOPLASTY WITH STENT PLACEMENT  1999   RCA; Dr. Melvern Banker G4 P4  . ILIAC ARTERY STENT Right 11/22/2014  . LOWER EXTREMITY ANGIOGRAM N/A 11/22/2014   Procedure: LOWER EXTREMITY ANGIOGRAM;  Surgeon: Lorretta Harp, MD; L-SFA 100%, 3v runoff, R-pCIA 95>>0% w/ 8 mm x 18 mm long Balloon expandable stent; R-dCIA 67>>0% w/ 8 mm x 4 cm long Cordis Smart Nitinol self expanding stent   . RENAL ARTERY STENT Right May 2007; 08/2006   ; restenosis  . TOTAL ABDOMINAL HYSTERECTOMY  ?1980's    for uterine CA    reports that she has been smoking cigarettes.  She has a 30.60 pack-year smoking history. She has never used smokeless tobacco. She reports that she does not drink alcohol or use drugs. family history includes Breast cancer in her daughter; Deep vein thrombosis in her mother; Diabetes in her brother, maternal aunt, maternal grandmother, mother, and sister; Emphysema in her brother;  Heart attack in her paternal aunt; Heart failure in her mother; Hypertension in her brother, father, and mother. No Known Allergies    Outpatient Encounter Medications as of 02/13/2018  Medication Sig  . ACCU-CHEK SOFTCLIX LANCETS lancets Check blood sugar once daily. Dx:E11.9  . alendronate (FOSAMAX) 70 MG tablet TAKE 1 TABLET BY MOUTH ONCE A WEEK. TAKE WITH A FULL  GLASS OF WATER ON AN EMPTY  STOMACH.  Marland Kitchen aspirin EC 81 MG tablet Take 1 tablet (81 mg total) by mouth daily.  Marland Kitchen atorvastatin (LIPITOR) 40 MG tablet Take 1 tablet (40 mg total) by mouth daily.  . benzonatate (TESSALON) 200 MG capsule Take 1  capsule (200 mg total) by mouth 3 (three) times daily as needed for cough.  . Blood Glucose Monitoring Suppl (ACCU-CHEK AVIVA PLUS) W/DEVICE KIT Check Blood sugar daily. Dx E11.9  . calcium carbonate (OS-CAL - DOSED IN MG OF ELEMENTAL CALCIUM) 1250 (500 Ca) MG tablet Take 1 tablet (500 mg of elemental calcium total) by mouth daily with breakfast.  . Cholecalciferol (VITAMIN D3) 2000 units capsule Take 1 capsule (2,000 Units total) by mouth daily.  . clopidogrel (PLAVIX) 75 MG tablet TAKE 1 TABLET(75 MG) BY MOUTH DAILY  . docusate sodium (COLACE) 100 MG capsule Take 1 capsule (100 mg total) by mouth every 12 (twelve) hours.  . meloxicam (MOBIC) 7.5 MG tablet TAKE 1 TABLET(7.5 MG) BY MOUTH DAILY (Patient taking differently: TAKE 1 TABLET(7.5 MG) BY MOUTH AS NEEDED)  . metoprolol succinate (TOPROL-XL) 25 MG 24 hr tablet TAKE 1 TABLET BY MOUTH DAILY  . Multiple Vitamins-Minerals (A THRU Z SELECT 50+ ADVANCED) TABS TAKE 1 TABLET BY MOUTH DAILY  . olmesartan (BENICAR) 20 MG tablet TAKE 1 TABLET(20 MG) BY MOUTH DAILY  . ondansetron (ZOFRAN ODT) 4 MG disintegrating tablet 19m ODT q6 hours prn nausea/vomit  . ONETOUCH VERIO test strip USE TO TEST BLOOD SUGAR ONCE DAILY  . Pirfenidone (ESBRIET) 267 MG CAPS TAKE 2 CAPSULES BY MOUTH THREE TIMES A DAY (Patient taking differently: TAKE 1 CAPSULE BY MOUTH THREE TIMES A DAY)  . ranitidine (ZANTAC) 150 MG tablet Take 1 tablet (150 mg total) by mouth 2 (two) times daily.  .Marland Kitchenspironolactone (ALDACTONE) 25 MG tablet TAKE 1 TABLET(25 MG) BY MOUTH DAILY  . SYMBICORT 160-4.5 MCG/ACT inhaler INHALE 2 PUFFS INTO THE LUNGS TWICE DAILY  . traMADol (ULTRAM) 50 MG tablet Take 1 tablet (50 mg total) by mouth every 6 (six) hours as needed.  . [DISCONTINUED] Cholecalciferol (VITAMIN D) 2000 units CAPS Take 1 capsule (2,000 Units total) by mouth daily.  . [DISCONTINUED] IRON PO Take 1 tablet by mouth daily.   No facility-administered encounter medications on file as of 02/13/2018.       REVIEW OF SYSTEMS  : All other systems reviewed and negative except where noted in the History of Present Illness.   PHYSICAL EXAM: Ht 5' 1.5" (1.562 m) Comment: height measured without shoes  Wt 134 lb 4 oz (60.9 kg)   BMI 24.96 kg/m  General: Well developed black female in no acute distress Head: Normocephalic and atraumatic Eyes:  Sclerae anicteric, conjunctiva pink. Ears: Normal auditory acuity Lungs: Clear throughout to auscultation; no increased WOB. Heart: Regular rate and rhythm; no M/R/G. Abdomen: Soft, non-distended.  BS present.  Non-tender. Rectal:  Will be done at the time of colonoscopy. Musculoskeletal: Symmetrical with no gross deformities  Skin: No lesions on visible extremities Extremities: No edema  Neurological: Alert oriented x 4, grossly non-focal Psychological:  Alert and cooperative. Normal mood and affect  ASSESSMENT AND PLAN: *Anemia:  Normocytic and iron levels normal.  B12 and folate orders entered but no yet drawn.  It appears that this may be an anemia of chronic disease.  Hemoccult negative. *Hematemesis:  Had an episode of vomiting blood about 3.5 weeks ago.  It followed several episodes of vomiting so could have been MWT or erosive esophagitis.  Will schedule for EGD. *Abnormal CT scan:  Findings c/w gastroenteritis. *Screening colonoscopy:  Never had one in the past.  Will schedule. *Chronic antiplatelet use due to history of cardiac stent:  Hold Plavix for 5 days before procedure - will instruct when and how to resume after procedure. Risks and benefits of procedure including bleeding, perforation, infection, missed lesions, medication reactions and possible hospitalization or surgery if complications occur explained. Additional rare but real risk of cardiovascular event such as heart attack or ischemia/infarct of other organs off of Plavix explained and need to seek urgent help if this occurs. Will communicate by phone or EMR with patient's  prescribing provider that to confirm that holding Plavix is reasonable in this case.    CC:  Ann Held, *  Agree with Ms. Toree Edling's management.  Gatha Mayer, MD, [facility]

## 2018-02-13 NOTE — Patient Instructions (Signed)

## 2018-02-13 NOTE — Telephone Encounter (Signed)
[facility] Pre-operative Risk Assessment     Request for surgical clearance:     Endoscopy Procedure  What type of surgery is being performed?     Endoscopy and Colonoscopy  When is this surgery scheduled?     02-18-18  What type of clearance is required ?   Pharmacy  Are there any medications that need to be held prior to surgery and how long? Plavix  Practice name and name of physician performing surgery?      [facility]  What is your office phone and fax number?      Phone- [phone]  [phone]  Anesthesia type (None, local, MAC, general) ?       MAC

## 2018-02-13 NOTE — Assessment & Plan Note (Signed)
Rotator cuff tear.  Patient given injection and did not respond fairly well to it.  We discussed formal physical therapy which patient declined.  We discussed advanced imaging as well.  Patient declined this as well.  We discussed with patient's significant number of comorbidities especially peripheral arterial disease that this could be other possibilities.  Patient does not want any type of other work-up.  History of dyspnea on but did not seem short of breath today.  Follow-up again with me in 4 to 6 weeks

## 2018-02-13 NOTE — Telephone Encounter (Signed)
   Primary Cardiologist:Jonathan Gwenlyn Found, MD  Chart reviewed as part of pre-operative protocol coverage. H/o CAD with MI, prior stenting, PAD, HTN, HLD, tobacco. Last PCI 1999 per Dr. Kennon Holter notes. Last PAD procedure 2016. Last nuc 2016 reported to be low risk. Clearance was just received today to come off Plavix for 6/11 so procedure may need to be delayed seeing as how this typically has to be held at least 5 days before procedure (unless GI feels less time is needed). I was unable to reach patient by voicemail - phone just rang and rang and mobile just went directly to VM, so preop team will need to try again tomorrow.  Will route to Dr. Gwenlyn Found for input on stopping Plavix. Dr. Gwenlyn Found - Please route response to [facility] PREOP (the pre-op pool). Thank you.  Charlie Pitter, PA-C 02/13/2018, 5:25 PM

## 2018-02-14 ENCOUNTER — Telehealth: Payer: Self-pay | Admitting: Emergency Medicine

## 2018-02-14 NOTE — Telephone Encounter (Signed)
Spoke with desiree and explained the patient will need to be seen prior to procedure. She is going to contact the daughter and have her call to schedule appointment.

## 2018-02-14 NOTE — Telephone Encounter (Signed)
Left message for patients daughter [name].

## 2018-02-14 NOTE — Telephone Encounter (Signed)
Spoke to patients daughter and informed her, she states that she did not take any yesterday or today and she will continue to hold until after her procedure per discharge instructions. She will inform her mother.

## 2018-02-14 NOTE — Telephone Encounter (Signed)
Dr. Carlean Purl  Please review patients phone notes with cardiology, she is on for a procedure on Tuesday 02/18/18 with you. Per her daughter she has not been taking plavix since 02/12/18 on her own. Dr. Gwenlyn Found did ok this but looks like cardiology would like her to be seen again. Dr. Gwenlyn Found last saw her in 06/2017.  Please advise if I should reschedule procedure.

## 2018-02-14 NOTE — Telephone Encounter (Signed)
   Primary Cardiologist:Jonathan Gwenlyn Found, MD  Chart reviewed as part of pre-operative protocol coverage. Because of Christine Meyer's past medical history and time since last visit, he/she will require a follow-up visit in order to better assess preoperative cardiovascular risk.  Pre-op covering staff: - Please schedule appointment and call patient to inform them. - Please contact requesting surgeon's office via preferred method (i.e, phone, fax) to inform them of need for appointment prior to surgery.  Kerin Ransom, PA-C  02/14/2018, 1:33 PM

## 2018-02-14 NOTE — Telephone Encounter (Signed)
Okay to interrupt antiplatelet therapy for a week prior to the procedure.

## 2018-02-17 NOTE — Telephone Encounter (Signed)
OK to proceed with EGD Off clopidogrel - Dr. Gwenlyn Found oked this  I do think Jess needs to complete the note, however

## 2018-02-18 ENCOUNTER — Encounter: Payer: Self-pay | Admitting: Internal Medicine

## 2018-02-18 ENCOUNTER — Other Ambulatory Visit: Payer: Self-pay

## 2018-02-18 ENCOUNTER — Ambulatory Visit (AMBULATORY_SURGERY_CENTER): Payer: Medicare Other | Admitting: Internal Medicine

## 2018-02-18 VITALS — BP 176/63 | HR 68 | Temp 97.5°F | Resp 18 | Ht 61.0 in | Wt 134.0 lb

## 2018-02-18 DIAGNOSIS — K920 Hematemesis: Secondary | ICD-10-CM | POA: Diagnosis not present

## 2018-02-18 DIAGNOSIS — K317 Polyp of stomach and duodenum: Secondary | ICD-10-CM | POA: Diagnosis not present

## 2018-02-18 DIAGNOSIS — D125 Benign neoplasm of sigmoid colon: Secondary | ICD-10-CM | POA: Diagnosis not present

## 2018-02-18 DIAGNOSIS — Z1211 Encounter for screening for malignant neoplasm of colon: Secondary | ICD-10-CM | POA: Diagnosis present

## 2018-02-18 DIAGNOSIS — E119 Type 2 diabetes mellitus without complications: Secondary | ICD-10-CM | POA: Diagnosis not present

## 2018-02-18 DIAGNOSIS — K635 Polyp of colon: Secondary | ICD-10-CM

## 2018-02-18 MED ORDER — PANTOPRAZOLE SODIUM 20 MG PO TBEC: 20.0000 mg | DELAYED_RELEASE_TABLET | Freq: Every day | ORAL | 3 refills | Status: DC

## 2018-02-18 MED ORDER — SODIUM CHLORIDE 0.9 % IV SOLN: 500.0000 mL | Freq: Once | INTRAVENOUS | Status: DC

## 2018-02-18 NOTE — Progress Notes (Signed)
Called to room to assist during endoscopic procedure.  Patient ID and intended procedure confirmed with present staff. Received instructions for my participation in the procedure from the performing physician.  

## 2018-02-18 NOTE — Progress Notes (Signed)
Pt's states no medical or surgical changes since previsit or office visit. 

## 2018-02-18 NOTE — Op Note (Signed)
[facility] Patient Name: Christine Meyer Procedure Date: 02/18/2018 2:40 PM MRN: [id number] Endoscopist: Gatha Mayer , MD Age: 67 Referring MD:  Date of Birth: 05/30/1951 Gender: Female Account #: [id number] Procedure:                Colonoscopy Indications:              Screening for colorectal malignant neoplasm Medicines:                Propofol per Anesthesia, Monitored Anesthesia Care Procedure:                Pre-Anesthesia Assessment:                           - Prior to the procedure, a History and Physical                            was performed, and patient medications and                            allergies were reviewed. The patient's tolerance of                            previous anesthesia was also reviewed. The risks                            and benefits of the procedure and the sedation                            options and risks were discussed with the patient.                            All questions were answered, and informed consent                            was obtained. Prior Anticoagulants: The patient                            last took Plavix (clopidogrel) 7 days prior to the                            procedure. ASA Grade Assessment: III - A patient                            with severe systemic disease. After reviewing the                            risks and benefits, the patient was deemed in                            satisfactory condition to undergo the procedure.                           After obtaining informed consent, the colonoscope  was passed under direct vision. Throughout the                            procedure, the patient's blood pressure, pulse, and                            oxygen saturations were monitored continuously. The                            Colonoscope was introduced through the anus and                            advanced to the the cecum, identified by       appendiceal orifice and ileocecal valve. The                            colonoscopy was performed without difficulty. The                            patient tolerated the procedure well. The quality                            of the bowel preparation was good. The ileocecal                            valve, appendiceal orifice, and rectum were                            photographed. The bowel preparation used was                            Miralax. Scope In: 2:55:37 PM Scope Out: 3:10:04 PM Scope Withdrawal Time: 0 hours 8 minutes 37 seconds  Total Procedure Duration: 0 hours 14 minutes 27 seconds  Findings:                 The perianal and digital rectal examinations were                            normal.                           A diminutive polyp was found in the sigmoid colon.                            The polyp was sessile. The polyp was removed with a                            cold snare. Resection and retrieval were complete.                            Verification of patient identification for the                            specimen was done. Estimated  blood loss was minimal.                           Internal hemorrhoids were found during retroflexion.                           The exam was otherwise without abnormality on                            direct and retroflexion views. Complications:            No immediate complications. Estimated Blood Loss:     Estimated blood loss was minimal. Impression:               - One diminutive polyp in the sigmoid colon,                            removed with a cold snare. Resected and retrieved.                           - Internal hemorrhoids.                           - The examination was otherwise normal on direct                            and retroflexion views. Recommendation:           - Patient has a contact number available for                            emergencies. The signs and symptoms of potential                             delayed complications were discussed with the                            patient. Return to normal activities tomorrow.                            Written discharge instructions were provided to the                            patient.                           - Resume previous diet.                           - Continue present medications.                           - Resume Plavix (clopidogrel) at prior dose today.                           - Repeat colonoscopy is recommended. The  colonoscopy date will be determined after pathology                            results from today's exam become available for                            review. Gatha Mayer, MD 02/18/2018 3:25:13 PM This report has been signed electronically.

## 2018-02-18 NOTE — Op Note (Signed)
[facility] Patient Name: Christine Meyer Procedure Date: 02/18/2018 2:41 PM MRN: [id number] Endoscopist: Gatha Mayer , MD Age: 67 Referring MD:  Date of Birth: Jan 07, 1951 Gender: Female Account #: [id number] Procedure:                Upper GI endoscopy Indications:              Hematemesis Medicines:                Propofol per Anesthesia, Monitored Anesthesia Care Procedure:                Pre-Anesthesia Assessment:                           - Prior to the procedure, a History and Physical                            was performed, and patient medications and                            allergies were reviewed. The patient's tolerance of                            previous anesthesia was also reviewed. The risks                            and benefits of the procedure and the sedation                            options and risks were discussed with the patient.                            All questions were answered, and informed consent                            was obtained. Prior Anticoagulants: The patient                            last took Plavix (clopidogrel) 7 days prior to the                            procedure. ASA Grade Assessment: III - A patient                            with severe systemic disease. After reviewing the                            risks and benefits, the patient was deemed in                            satisfactory condition to undergo the procedure.                           After obtaining informed consent, the endoscope was  passed under direct vision. Throughout the                            procedure, the patient's blood pressure, pulse, and                            oxygen saturations were monitored continuously. The                            Endoscope was introduced through the mouth, and                            advanced to the second part of duodenum. The upper                            GI endoscopy  was accomplished without difficulty.                            The patient tolerated the procedure well. Scope In: Scope Out: Findings:                 A few diminutive sessile polyps with no stigmata of                            recent bleeding were found in the gastric fundus                            and in the gastric body. Biopsies were taken with a                            cold forceps for histology. Verification of patient                            identification for the specimen was done. Estimated                            blood loss was minimal.                           The Z-line was irregular. Biopsies were taken with                            a cold forceps for histology. Verification of                            patient identification for the specimen was done.                            Estimated blood loss was minimal.                           The exam was otherwise without abnormality.  The cardia and gastric fundus were normal on                            retroflexion. Complications:            No immediate complications. Estimated Blood Loss:     Estimated blood loss was minimal. Impression:               - A few gastric polyps. Biopsied.                           - Z-line irregular. Biopsied.                           - The examination was otherwise normal. Recommendation:           - Patient has a contact number available for                            emergencies. The signs and symptoms of potential                            delayed complications were discussed with the                            patient. Return to normal activities tomorrow.                            Written discharge instructions were provided to the                            patient.                           - Resume previous diet.                           - Continue present medications.                           - Resume Plavix (clopidogrel) at prior  dose today.                           - Await pathology results.                           - See the other procedure note for documentation of                            additional recommendations.                           - DC ranitidine and start pantoprazole 20 mg daily                            to reduce risk of ulcers and GI bleeding - on  clopidogrel and Fosamax and prn meloxicam (would                            try to avoid this) Gatha Mayer, MD 02/18/2018 3:22:07 PM This report has been signed electronically.

## 2018-02-18 NOTE — Patient Instructions (Addendum)
Nothing bad seen on the exams.  There were some small stomach polyps that I biopsied and I took biopsies of the junction of the esophagus and stomach - ? Inflammation.  There was one tiny colon polyp removed.  I am prescribing pantoprazole and stopping ranitidine - I think best if you are on this medication - it will protect the stomach better than ranitidine. Fosamax and Plavix and meloxicam all increase risk of ulcers and bleeding.  I will let you know about results and plans but am not worried about anything here.  I appreciate the opportunity to care for you. Gatha Mayer, MD, [facility]   Discharge instructions given. Handouts on polyps and hemorrhoids. Resume previous medications today including Plavix. YOU HAD AN ENDOSCOPIC PROCEDURE TODAY AT [facility]:   Refer to the procedure report that was given to you for any specific questions about what was found during the examination.  If the procedure report does not answer your questions, please call your gastroenterologist to clarify.  If you requested that your care partner not be given the details of your procedure findings, then the procedure report has been included in a sealed envelope for you to review at your convenience later.  YOU SHOULD EXPECT: Some feelings of bloating in the abdomen. Passage of more gas than usual.  Walking can help get rid of the air that was put into your GI tract during the procedure and reduce the bloating. If you had a lower endoscopy (such as a colonoscopy or flexible sigmoidoscopy) you may notice spotting of blood in your stool or on the toilet paper. If you underwent a bowel prep for your procedure, you may not have a normal bowel movement for a few days.  Please Note:  You might notice some irritation and congestion in your nose or some drainage.  This is from the oxygen used during your procedure.  There is no need for concern and it should clear up in a day or so.  SYMPTOMS TO  REPORT IMMEDIATELY:   Following lower endoscopy (colonoscopy or flexible sigmoidoscopy):  Excessive amounts of blood in the stool  Significant tenderness or worsening of abdominal pains  Swelling of the abdomen that is new, acute  Fever of 100F or higher   Following upper endoscopy (EGD)  Vomiting of blood or coffee ground material  New chest pain or pain under the shoulder blades  Painful or persistently difficult swallowing  New shortness of breath  Fever of 100F or higher  Black, tarry-looking stools  For urgent or emergent issues, a gastroenterologist can be reached at any hour by calling [phone].   DIET:  We do recommend a small meal at first, but then you may proceed to your regular diet.  Drink plenty of fluids but you should avoid alcoholic beverages for 24 hours.  ACTIVITY:  You should plan to take it easy for the rest of today and you should NOT DRIVE or use heavy machinery until tomorrow (because of the sedation medicines used during the test).    FOLLOW UP: Our staff will call the number listed on your records the next business day following your procedure to check on you and address any questions or concerns that you may have regarding the information given to you following your procedure. If we do not reach you, we will leave a message.  However, if you are feeling well and you are not experiencing any problems, there is no need to return our  call.  We will assume that you have returned to your regular daily activities without incident.  If any biopsies were taken you will be contacted by phone or by letter within the next 1-3 weeks.  Please call us at [phone] if you have not heard about the biopsies in 3 weeks.    SIGNATURES/CONFIDENTIALITY: You and/or your care partner have signed paperwork which will be entered into your electronic medical record.  These signatures attest to the fact that that the information above on your After Visit Summary has been  reviewed and is understood.  Full responsibility of the confidentiality of this discharge information lies with you and/or your care-partner.

## 2018-02-19 ENCOUNTER — Telehealth: Payer: Self-pay | Admitting: *Deleted

## 2018-02-19 NOTE — Telephone Encounter (Signed)
Left detailed message that pt would need an appt before she can be cleared for her upcoming procedure.

## 2018-02-19 NOTE — Telephone Encounter (Signed)
  Follow up Call-  Call back number 02/18/2018  Post procedure Call Back phone  # [phone]  Permission to leave phone message Yes  Some recent data might be hidden     Patient questions:  Do you have a fever, pain , or abdominal swelling? No. Pain Score  0 *  Have you tolerated food without any problems? Yes.    Have you been able to return to your normal activities? Yes.    Do you have any questions about your discharge instructions: Diet   No. Medications  No. Follow up visit  No.  Do you have questions or concerns about your Care? No.  Actions: * If pain score is 4 or above: No action needed, pain <4.

## 2018-02-19 NOTE — Telephone Encounter (Signed)
Pt called back and she has been scheduled to see Rosaria Ferries, PA-C 03/12/18.

## 2018-02-23 ENCOUNTER — Encounter: Payer: Self-pay | Admitting: Internal Medicine

## 2018-02-23 DIAGNOSIS — K317 Polyp of stomach and duodenum: Secondary | ICD-10-CM

## 2018-02-23 HISTORY — DX: Polyp of stomach and duodenum: K31.7

## 2018-02-23 NOTE — Progress Notes (Signed)
My Chart letter  1) hyperplastic gastric polyps recall EGD 1 year 2) hyperplastic sigmoid polyp - no recall 3) Needs to do labs that Dr. Cheri Rous ordered on 6/21

## 2018-02-28 ENCOUNTER — Other Ambulatory Visit (INDEPENDENT_AMBULATORY_CARE_PROVIDER_SITE_OTHER): Payer: Medicare Other

## 2018-02-28 DIAGNOSIS — E538 Deficiency of other specified B group vitamins: Secondary | ICD-10-CM | POA: Diagnosis not present

## 2018-02-28 DIAGNOSIS — D508 Other iron deficiency anemias: Secondary | ICD-10-CM

## 2018-02-28 NOTE — Addendum Note (Signed)
Addended by: Harl Bowie on: 02/28/2018 03:05 PM   Modules accepted: Orders

## 2018-02-28 NOTE — Addendum Note (Signed)
Addended by: Harl Bowie on: 02/28/2018 03:33 PM   Modules accepted: Orders

## 2018-03-04 ENCOUNTER — Encounter: Payer: Self-pay | Admitting: Family Medicine

## 2018-03-04 LAB — CBC WITH DIFFERENTIAL/PLATELET
BASOS ABS: 31 {cells}/uL (ref 0–200)
Basophils Relative: 0.5 %
EOS PCT: 1.8 %
Eosinophils Absolute: 110 cells/uL (ref 15–500)
HCT: 30.4 % — ABNORMAL LOW (ref 35.0–45.0)
HEMOGLOBIN: 10.4 g/dL — AB (ref 11.7–15.5)
Lymphs Abs: 2220 cells/uL (ref 850–3900)
MCH: 34.9 pg — ABNORMAL HIGH (ref 27.0–33.0)
MCHC: 34.2 g/dL (ref 32.0–36.0)
MCV: 102 fL — ABNORMAL HIGH (ref 80.0–100.0)
MPV: 9.3 fL (ref 7.5–12.5)
Monocytes Relative: 6.4 %
NEUTROS ABS: 3349 {cells}/uL (ref 1500–7800)
NEUTROS PCT: 54.9 %
Platelets: 232 10*3/uL (ref 140–400)
RBC: 2.98 10*6/uL — ABNORMAL LOW (ref 3.80–5.10)
RDW: 16.8 % — AB (ref 11.0–15.0)
Total Lymphocyte: 36.4 %
WBC mixed population: 390 cells/uL (ref 200–950)
WBC: 6.1 10*3/uL (ref 3.8–10.8)

## 2018-03-04 LAB — VITAMIN B1: VITAMIN B1 (THIAMINE): 17 nmol/L (ref 8–30)

## 2018-03-04 LAB — B12 AND FOLATE PANEL: Vitamin B-12: 198 pg/mL — ABNORMAL LOW (ref 200–1100)

## 2018-03-06 ENCOUNTER — Telehealth: Payer: Self-pay

## 2018-03-06 ENCOUNTER — Ambulatory Visit: Payer: Medicare Other | Admitting: Gastroenterology

## 2018-03-06 ENCOUNTER — Other Ambulatory Visit: Payer: Self-pay | Admitting: Family Medicine

## 2018-03-06 NOTE — Telephone Encounter (Signed)
Routed to Dr. Etter Sjogren to review daughter's request for vit b12 injections to be given at home.

## 2018-03-06 NOTE — Telephone Encounter (Signed)
Pt. needs B12 injections ordered and a nurse visit scheduled for IM injection teaching. Routed to Dr. Etter Sjogren to order and provide any additional follow-up instructions.

## 2018-03-06 NOTE — Telephone Encounter (Signed)
Ok to do SQ b12

## 2018-03-06 NOTE — Telephone Encounter (Signed)
Yes-- we can teach SQ injections

## 2018-03-07 ENCOUNTER — Other Ambulatory Visit: Payer: Self-pay | Admitting: Family Medicine

## 2018-03-07 MED ORDER — CYANOCOBALAMIN 1000 MCG/ML IJ SOLN: 1000.0000 ug | Freq: Once | INTRAMUSCULAR | 11 refills | Status: DC

## 2018-03-07 MED ORDER — "SYRINGE/NEEDLE (DISP) 25G X 1"" 3 ML MISC": 0 refills | Status: DC

## 2018-03-07 NOTE — Telephone Encounter (Signed)
B12 injection ordered by Dr. Etter Sjogren on 6/28. Author phone pt.'s daughter to set up nurse appointment for injection teaching, and daughter, [name], stated that she is in the medical field (works at [employer]) and does not need injection teaching. No other questions or concerns at this time.

## 2018-03-10 DIAGNOSIS — M79672 Pain in left foot: Secondary | ICD-10-CM | POA: Diagnosis not present

## 2018-03-10 DIAGNOSIS — B351 Tinea unguium: Secondary | ICD-10-CM | POA: Diagnosis not present

## 2018-03-10 DIAGNOSIS — M79671 Pain in right foot: Secondary | ICD-10-CM | POA: Diagnosis not present

## 2018-03-10 DIAGNOSIS — L84 Corns and callosities: Secondary | ICD-10-CM | POA: Diagnosis not present

## 2018-03-10 DIAGNOSIS — E1159 Type 2 diabetes mellitus with other circulatory complications: Secondary | ICD-10-CM | POA: Diagnosis not present

## 2018-03-11 NOTE — Progress Notes (Signed)
Cardiology Office Note   Date:  03/12/2018   ID:  Christine Meyer, DOB 1950/11/05, MRN [id number]  PCP:  Christine Held, DO  Cardiologist: Dr. Gwenlyn Meyer, 07/09/2017 Christine Ferries, PA-C   No chief complaint on file.   History of Present Illness: Christine Meyer is a 67 y.o. female with a history of CAD with MI, prior stenting, PAD, HTN, HLD, tobacco. Last PCI 1999 per Dr. Kennon Meyer notes. Last PAD procedure 2016. Last nuc 2016 reported to be low risk.  06/07 phone notes regarding clearance for colonoscopy/EGD  Christine Meyer presents for cardiology evaluation  Her colonoscopy was normal.  [name].  She is getting B12 shots for that.  [name], worse when it is going to rain, better after steroid injection.   [name]. Both legs. In her lower shins and feet. Mostly when she is laying down at night, she will move and cramp. She will pray, take a spoon of mustard, rub her legs and then get up and walk if no better. The walking will get rid of the symptoms.   She is able to stand on her feet when working in [employer] and at [employer]. She tolerates the standing and walking she has to do at work without leg pain or cramping.   She does not get SOB except when vacuuming or walking longer distances. Her lung doctor has her on an inhaler that helps her breathing.   She never gets chest pain.   No LE edema, no orthopnea or PND.  [name]   Past Medical History:  Diagnosis Date  . Anemia    NOS  . CAD (coronary artery disease)    Stent RCA 1999  . COPD (chronic obstructive pulmonary disease) (Christine Meyer)   . Diabetes mellitus, type 2 (HCC)    Diet controlled  . Gastric polyps - hyperplastic 02/23/2018  . Hyperlipidemia   . Hypertension   . OSA (obstructive sleep apnea) [name]   . PVD (peripheral vascular disease) (HCC)    Carotid stenosis, renal artery stenosis  . Sleep apnea    dx'd but doesn't use mask   . Uterine cancer  ([place]) ?9826'E    Past Surgical History:  Procedure Laterality Date  . ABDOMINAL ANGIOGRAM  11/22/2014   Procedure: ABDOMINAL ANGIOGRAM;  Surgeon: Christine Harp, MD;  Location: [facility] CATH LAB;  Service: Cardiovascular;;  . COLONOSCOPY  2000   negative  . CORONARY ANGIOPLASTY WITH STENT PLACEMENT  1999   RCA; Dr. Melvern Meyer G4 P4  . ILIAC ARTERY STENT Right 11/22/2014  . LOWER EXTREMITY ANGIOGRAM N/A 11/22/2014   Procedure: LOWER EXTREMITY ANGIOGRAM;  Surgeon: Christine Harp, MD; L-SFA 100%, 3v runoff, R-pCIA 95>>0% w/ 8 mm x 18 mm long Balloon expandable stent; R-dCIA 67>>0% w/ 8 mm x 4 cm long Cordis Smart Nitinol self expanding stent   . RENAL ARTERY STENT Right May 2007; 08/2006   ; restenosis  . TOTAL ABDOMINAL HYSTERECTOMY  ?1980's    for uterine CA    Current Outpatient Medications  Medication Sig Dispense Refill  . ACCU-CHEK SOFTCLIX LANCETS lancets Check blood sugar once daily. Dx:E11.9 100 each 5  . alendronate (FOSAMAX) 70 MG tablet TAKE 1 TABLET(70 MG) BY MOUTH 1 TIME A WEEK WITH A FULL GLASS OF WATER AND ON AN EMPTY STOMACH 12 tablet 0  . aspirin EC 81 MG tablet Take 1 tablet (81 mg total) by mouth daily. [place]  tablet 0  . atorvastatin (LIPITOR) 40 MG tablet Take 1 tablet (40 mg total) by mouth daily. 90 tablet 1  . Blood Glucose Monitoring Suppl (ACCU-CHEK AVIVA PLUS) W/DEVICE KIT Check Blood sugar daily. Dx E11.9 1 kit 0  . calcium carbonate (OS-CAL - DOSED IN MG OF ELEMENTAL CALCIUM) 1250 (500 Ca) MG tablet Take 1 tablet (500 mg of elemental calcium total) by mouth daily with breakfast. 90 tablet 3  . Cholecalciferol (VITAMIN D3) 2000 units capsule Take 1 capsule (2,000 Units total) by mouth daily. 60 capsule 11  . clopidogrel (PLAVIX) 75 MG tablet TAKE 1 TABLET(75 MG) BY MOUTH DAILY 90 tablet 2  . docusate sodium (COLACE) 100 MG capsule Take 1 capsule (100 mg total) by mouth every 12 (twelve) hours. 30 capsule 0  . meloxicam (MOBIC) 7.5 MG tablet TAKE 1 TABLET(7.5 MG) BY MOUTH  DAILY (Patient taking differently: TAKE 1 TABLET(7.5 MG) BY MOUTH AS NEEDED) 30 tablet 0  . metoprolol succinate (TOPROL-XL) 25 MG 24 hr tablet TAKE 1 TABLET BY MOUTH DAILY 90 tablet 0  . Multiple Vitamins-Minerals (A THRU Z SELECT 50+ ADVANCED) TABS TAKE 1 TABLET BY MOUTH DAILY 220 tablet 1  . olmesartan (BENICAR) 20 MG tablet TAKE 1 TABLET(20 MG) BY MOUTH DAILY 90 tablet 1  . ONETOUCH VERIO test strip USE TO TEST BLOOD SUGAR ONCE DAILY 75 each 0  . pantoprazole (PROTONIX) 20 MG tablet Take 1 tablet (20 mg total) by mouth daily before breakfast. 90 tablet 3  . Pirfenidone (ESBRIET) 267 MG CAPS TAKE 2 CAPSULES BY MOUTH THREE TIMES A DAY (Patient taking differently: TAKE 1 CAPSULE BY MOUTH THREE TIMES A DAY) 180 capsule 11  . spironolactone (ALDACTONE) 25 MG tablet TAKE 1 TABLET(25 MG) BY MOUTH DAILY 90 tablet 1  . SYMBICORT 160-4.5 MCG/ACT inhaler INHALE 2 PUFFS INTO THE LUNGS TWICE DAILY 10.2 g 0  . SYRINGE-NEEDLE, DISP, 3 ML 25G X 1" 3 ML MISC Use to give b12 injection once a month 12 each 0  . traMADol (ULTRAM) 50 MG tablet Take 1 tablet (50 mg total) by mouth every 6 (six) hours as needed. 15 tablet 0  . ondansetron (ZOFRAN ODT) 4 MG disintegrating tablet 33m ODT q6 hours prn nausea/vomit (Patient not taking: Reported on 03/12/2018) 6 tablet 0   Current Facility-Administered Medications  Medication Dose Route Frequency Provider Last Rate Last Dose  . 0.9 %  sodium chloride infusion  500 mL Intravenous Once GGatha Mayer MD        Allergies:   Patient has no known allergies.    Social History:  The patient  reports that she has been smoking cigarettes.  She has a 30.60 pack-year smoking history. She has never used smokeless tobacco. She reports that she does not drink alcohol or use drugs.   Family History:  The patient's family history includes Breast cancer in her daughter; Deep vein thrombosis in her mother; Diabetes in her brother, maternal aunt, maternal grandmother, mother, and  sister; Emphysema in her brother; Heart attack in her paternal aunt; Heart failure in her mother; Hypertension in her brother, father, and mother.    ROS:  Please see the history of present illness. All other systems are reviewed and negative.    PHYSICAL EXAM: VS:  BP (!) 116/50   Pulse 72   Ht 5' 1"  (1.549 m)   Wt 131 lb (59.4 kg)   SpO2 99%   BMI 24.75 kg/m  , BMI Body mass index is 24.75  kg/m. GEN: Well nourished, well developed, female in no acute distress  HEENT: normal for age  Neck: no JVD, no carotid bruit, no masses Cardiac: RRR; no murmur, no rubs, or gallops Respiratory: Generally clear, rare rhonchi cleared with cough, normal work of breathing GI: soft, nontender, nondistended, + BS MS: no deformity or atrophy; no edema; distal pulses are 2+ in upper extremities, 1+ DP/PT pulses Skin: warm and dry, no rash Neuro:  Strength and sensation are intact Psych: euthymic mood, full affect   EKG:  EKG is not ordered today.   Recent Labs: 04/22/2017: TSH 1.41 01/30/2018: ALT 20; BUN 10; Creatinine, Ser 1.12; Potassium 3.6; Sodium 137 02/28/2018: Hemoglobin 10.4; Platelets 232    Lipid Panel    Component Value Date/Time   CHOL 155 10/15/2016 1032   TRIG 71.0 10/15/2016 1032   HDL 59.20 10/15/2016 1032   CHOLHDL 3 10/15/2016 1032   VLDL 14.2 10/15/2016 1032   LDLCALC 82 10/15/2016 1032     Wt Readings from Last 3 Encounters:  03/12/18 131 lb (59.4 kg)  02/18/18 134 lb (60.8 kg)  02/13/18 134 lb 4 oz (60.9 kg)     Other studies Reviewed: Additional studies/ records that were reviewed today include: Office notes, hospital records and testing.  ASSESSMENT AND PLAN:  1.  PAD: She is not having exertional symptoms, no cramping when walking.  However, she has symptoms after going to bed several times a week. -Repeat lower extremity Dopplers as scheduled and follow-up with Dr. Gwenlyn Meyer -Consider Pletal  2.  CAD: She is not having any ischemic symptoms.  She gets a  little short of breath when she exerts herself above the norm, but she attributes that to her COPD.  No signs of volume overload on exam. - Continue aspirin, statin, Plavix, beta-blocker, Aldactone and ARB.   Current medicines are reviewed at length with the patient today.  The patient does not have concerns regarding medicines.  The following changes have been made:  no change  Labs/ tests ordered today include:  No orders of the defined types were placed in this encounter.    Disposition:   FU with Dr. Gwenlyn Meyer  Signed, Christine Ferries, PA-C  03/12/2018 8:16 AM    [place] Phone: [phone]; Fax: [phone]  This note was written with the assistance of speech recognition software. Please excuse any transcriptional errors.

## 2018-03-12 ENCOUNTER — Other Ambulatory Visit: Payer: Self-pay | Admitting: Pulmonary Disease

## 2018-03-12 ENCOUNTER — Ambulatory Visit (INDEPENDENT_AMBULATORY_CARE_PROVIDER_SITE_OTHER): Payer: Medicare Other | Admitting: Physician Assistant

## 2018-03-12 ENCOUNTER — Encounter: Payer: Self-pay | Admitting: Physician Assistant

## 2018-03-12 VITALS — BP 116/50 | HR 72 | Ht 61.0 in | Wt 131.0 lb

## 2018-03-12 DIAGNOSIS — I2510 Atherosclerotic heart disease of native coronary artery without angina pectoris: Secondary | ICD-10-CM | POA: Diagnosis not present

## 2018-03-12 DIAGNOSIS — I739 Peripheral vascular disease, unspecified: Secondary | ICD-10-CM | POA: Diagnosis not present

## 2018-03-12 NOTE — Patient Instructions (Signed)
Medication Instructions:  Your physician recommends that you continue on your current medications as directed. Please refer to the Current Medication list given to you today.  Testing/Procedures: Your physician has requested that you have an aorto-iliac duplex in December. During this test, an ultrasound is used to evaluate the aorta and iliac arteries. Do not eat after midnight the day before and avoid carbonated beverages  Your physician has requested that you have an ankle brachial index (ABI) in December. During this test an ultrasound and blood pressure cuff are used to evaluate the arteries that supply the arms and legs with blood. Allow thirty minutes for this exam. There are no restrictions or special instructions.  Follow-Up: Dr. Gwenlyn Found in December after testing    If you need a refill on your cardiac medications before your next appointment, please call your pharmacy.

## 2018-03-17 ENCOUNTER — Encounter: Payer: Self-pay | Admitting: Pulmonary Disease

## 2018-03-17 MED ORDER — BUDESONIDE-FORMOTEROL FUMARATE 160-4.5 MCG/ACT IN AERO: 2.0000 | INHALATION_SPRAY | Freq: Two times a day (BID) | RESPIRATORY_TRACT | 0 refills | Status: DC

## 2018-03-26 NOTE — Progress Notes (Signed)
[facility] [address], [place] [address] Phone: [phone] Subjective:    I'm seeing this patient by the request  of:    CC: Left shoulder pain  [id number]  Christine Meyer is a 67 y.o. female coming in with complaint of left shoulder pain.  Patient was found to have a rotator cuff tear.  Given injection at last exam and declined formal physical therapy.  Only doing home exercises intermittently.  Would state that she still feels about 90% better though.  Patient states still some discomfort but no true pain that keeps her up at night.  Has been noticing radiation going up and down the arm some.  Goes towards the elbow.  Some associated neck pain.    Past Medical History:  Diagnosis Date  . Anemia    NOS  . CAD (coronary artery disease)    Stent RCA 1999  . COPD (chronic obstructive pulmonary disease) ([place])   . Diabetes mellitus, type 2 (HCC)    Diet controlled  . Gastric polyps - hyperplastic 02/23/2018  . Hyperlipidemia   . Hypertension   . OSA (obstructive sleep apnea) [name]   . PVD (peripheral vascular disease) (HCC)    Carotid stenosis, renal artery stenosis  . Sleep apnea    dx'd but doesn't use mask   . Uterine cancer ([place]) ?2952'W   Past Surgical History:  Procedure Laterality Date  . ABDOMINAL ANGIOGRAM  11/22/2014   Procedure: ABDOMINAL ANGIOGRAM;  Surgeon: Lorretta Harp, MD;  Location: [facility] CATH LAB;  Service: Cardiovascular;;  . COLONOSCOPY  2000   negative  . CORONARY ANGIOPLASTY WITH STENT PLACEMENT  1999   RCA; Dr. Melvern Banker G4 P4  . ILIAC ARTERY STENT Right 11/22/2014  . LOWER EXTREMITY ANGIOGRAM N/A 11/22/2014   Procedure: LOWER EXTREMITY ANGIOGRAM;  Surgeon: Lorretta Harp, MD; L-SFA 100%, 3v runoff, R-pCIA 95>>0% w/ 8 mm x 18 mm long Balloon expandable stent; R-dCIA 67>>0% w/ 8 mm x 4 cm long Cordis Smart Nitinol self expanding stent   . RENAL ARTERY STENT Right May 2007; 08/2006   ; restenosis  . TOTAL  ABDOMINAL HYSTERECTOMY  ?1980's    for uterine CA   Social History   Socioeconomic History  . Marital status: Single    Spouse name: Not on file  . Number of children: 4  . Years of education: Not on file  . Highest education level: Not on file  Occupational History  . 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    Employer: [employer]  Social Needs  . Financial resource strain: Not on file  . Food insecurity:    Worry: Not on file    Inability: Not on file  . Transportation needs:    Medical: Not on file    Non-medical: Not on file  Tobacco Use  . Smoking status: Current Every Day Smoker    Packs/day: 0.60    Years: 51.00    Pack years: 30.60    Types: Cigarettes  . Smokeless tobacco: Never Used  . Tobacco comment: down to 5-6 cigarettes daily  Substance and Sexual Activity  . Alcohol use: No    Alcohol/week: 0.0 oz  . Drug use: No  . Sexual activity: Yes  Lifestyle  . Physical activity:    Days per week: Not on file    Minutes per session: Not on file  . Stress: Not on file  Relationships  .  Social connections:    Talks on phone: Not on file    Gets together: Not on file    Attends religious service: Not on file    Active member of club or organization: Not on file    Attends meetings of clubs or organizations: Not on file    Relationship status: Not on file  Other Topics Concern  . Not on file  Social History Narrative   Lives at home with grandson.    No Known Allergies Family History  Problem Relation Age of Onset  . Heart failure Mother        after hip fx.   . Hypertension Mother   . Diabetes Mother   . Deep vein thrombosis Mother   . Hypertension Father   . Diabetes Sister        insulin dependent  . Hypertension Brother   . Emphysema Brother   . Diabetes Brother        insulin dependent  . Diabetes Maternal Aunt        non-insulent dependent  . Heart attack Paternal Aunt   . Diabetes Maternal  Grandmother        non-insulin dependent  . Breast cancer Daughter      Past medical history, social, surgical and family history all reviewed in electronic medical record.  No pertanent information unless stated regarding to the chief complaint.   Review of Systems:Review of systems updated and as accurate as of 03/27/18  No headache, visual changes, nausea, vomiting, diarrhea, constipation, dizziness, abdominal pain, skin rash, fevers, chills, night sweats, weight loss, swollen lymph nodes, body aches, joint swelling,  chest pain, shortness of breath, mood changes.  Positive muscle aches  Objective  Blood pressure (!) 138/40, pulse 77, height 5\' 1"  (1.549 m), weight 132 lb (59.9 kg), SpO2 98 %. Systems examined below as of 03/27/18   General: No apparent distress alert and oriented x3 mood and affect normal, dressed appropriately.  HEENT: Pupils equal, extraocular movements intact  Respiratory: Patient's speak in full sentences and does not appear short of breath  Cardiovascular: No lower extremity edema, non tender, no erythema  Skin: Warm dry intact with no signs of infection or rash on extremities or on axial skeleton.  Abdomen: Soft nontender  Neuro: Cranial nerves II through XII are intact, neurovascularly intact in all extremities with 2+ DTRs and 2+ pulses.  Lymph: No lymphadenopathy of posterior or anterior cervical chain or axillae bilaterally.  Gait normal with good balance and coordination.  MSK:  tender with mild limited range of motion but good stability and symmetroic strength and tone of , elbows, wrist, hip, knee and ankles bilaterally.  Left shoulder exam does show that there is atrophy of the musculature.  Patient still has positive impingement.  4 out of 5 strength compared to the contralateral side.  Positive drop arm.  Neck exam shows loss of lordosis.  Positive Spurling's on the left side.  Crepitus with range of motion with lacking the last 5 to 10 degrees in all  planes.  Grip strength is symmetric    Impression and Recommendations:     This case required medical decision making of moderate complexity.      Note: This dictation was prepared with Dragon dictation along with smaller phrase technology. Any transcriptional errors that result from this process are unintentional.

## 2018-03-27 ENCOUNTER — Encounter: Payer: Self-pay | Admitting: Family Medicine

## 2018-03-27 ENCOUNTER — Ambulatory Visit (INDEPENDENT_AMBULATORY_CARE_PROVIDER_SITE_OTHER): Payer: Medicare Other | Admitting: Family Medicine

## 2018-03-27 DIAGNOSIS — M5030 Other cervical disc degeneration, unspecified cervical region: Secondary | ICD-10-CM | POA: Diagnosis not present

## 2018-03-27 DIAGNOSIS — M75112 Incomplete rotator cuff tear or rupture of left shoulder, not specified as traumatic: Secondary | ICD-10-CM

## 2018-03-27 MED ORDER — GABAPENTIN 100 MG PO CAPS: 200.0000 mg | ORAL_CAPSULE | Freq: Every day | ORAL | 3 refills | Status: DC

## 2018-03-27 NOTE — Patient Instructions (Signed)
Good to see you  Christine Meyer is your friend.  Lets try gabapentin 200mg  at night in case this is the nerve in your neck  Keep up the exercises 3 times a week  See me again in 6 weeks!

## 2018-03-27 NOTE — Assessment & Plan Note (Signed)
X-rays pending.  Discussed icing regimen and home exercise.  Discussed which activities to do which wants to avoid.  Gabapentin given and warned of potential side effects.  Follow-up again in 4 weeks

## 2018-03-27 NOTE — Assessment & Plan Note (Signed)
Improved after the injection.  Discussed icing regimen and home exercise.  Discussed which activities to do which wants to avoid.  Increase activity slowly.  As long as patient continues to do well we will continue to monitor.  Differential includes a cervical radiculopathy and given gabapentin.

## 2018-03-28 ENCOUNTER — Encounter (HOSPITAL_COMMUNITY): Payer: Medicare Other

## 2018-04-02 ENCOUNTER — Other Ambulatory Visit: Payer: Self-pay | Admitting: Family Medicine

## 2018-04-02 LAB — HM DIABETES EYE EXAM

## 2018-04-21 ENCOUNTER — Encounter: Payer: Self-pay | Admitting: *Deleted

## 2018-04-25 ENCOUNTER — Other Ambulatory Visit: Payer: Self-pay | Admitting: Family Medicine

## 2018-04-25 DIAGNOSIS — I10 Essential (primary) hypertension: Secondary | ICD-10-CM

## 2018-05-07 ENCOUNTER — Encounter: Payer: Self-pay | Admitting: Pulmonary Disease

## 2018-05-07 ENCOUNTER — Ambulatory Visit (INDEPENDENT_AMBULATORY_CARE_PROVIDER_SITE_OTHER): Payer: Medicare Other | Admitting: Pulmonary Disease

## 2018-05-07 VITALS — BP 124/68 | HR 79 | Ht 62.5 in | Wt 131.8 lb

## 2018-05-07 DIAGNOSIS — F17210 Nicotine dependence, cigarettes, uncomplicated: Secondary | ICD-10-CM

## 2018-05-07 DIAGNOSIS — J432 Centrilobular emphysema: Secondary | ICD-10-CM

## 2018-05-07 DIAGNOSIS — J84112 Idiopathic pulmonary fibrosis: Secondary | ICD-10-CM

## 2018-05-07 NOTE — Patient Instructions (Signed)
COPD: Get a high-dose flu shot when they are available Continue Symbicort 2 puffs twice a day Practice good hand hygiene Stay active  Idiopathic pulmonary fibrosis: Please contact us with the name of your specialty pharmacy so that we can change the prescription so that you only have to take 1 pill 3 times a day We will get a lung function test on the next visit  Cigarette smoking: Do your best to stop smoking: Keep the appointment for the lung cancer screening CT scan later this week  We will see you back in 3 months or sooner if needed

## 2018-05-07 NOTE — Progress Notes (Signed)
Subjective:    Patient ID: Christine Meyer, female    DOB: [birth year], 67 y.o.   MRN: [id number]  Synopsis: IPF and COPD: Christine Meyer was referred to the [facility] in 2016 for evaluation of shortness of breath. She had a CT chest performed which showed evidence of usual interstitial pneumonitis as well as centrilobular emphysema. She has a significant smoking history.  April 2016 rheumatology visit (Dr. Trudie Reed no evidence of connective tissue disease.  She was still smoking as of 10/2017.    HPI Chief Complaint  Patient presents with  . Follow-up    breathing better, here because it's time for follow-up   Christine Meyer has been doing well.  She feels like her breathing is doing better.   She is still smoking, down to 3 cigarettes a day at this point. She has not been sick with bronchitis or pneumonia since the last visit. She continues to take Esbriet 3 pills 3 times a day.  She says she has not received a larger dose yet. She says she is scheduled to have her lung cancer screening CT scan later this week.  She is still taking Symbicort 2 puffs twice a day.  Past Medical History:  Diagnosis Date  . Anemia    NOS  . CAD (coronary artery disease)    Stent RCA 1999  . COPD (chronic obstructive pulmonary disease) ([place])   . Diabetes mellitus, type 2 (HCC)    Diet controlled  . Gastric polyps - hyperplastic 02/23/2018  . Hyperlipidemia   . Hypertension   . OSA (obstructive sleep apnea) [name]   . PVD (peripheral vascular disease) (HCC)    Carotid stenosis, renal artery stenosis  . Sleep apnea    dx'd but doesn't use mask   . Uterine cancer ([place]) ?1980's      Review of Systems  Constitutional: Negative for chills, fatigue and fever.  HENT: Negative for postnasal drip, rhinorrhea and sinus pressure.   Respiratory: Positive for shortness of breath. Negative for cough and wheezing.   Cardiovascular: Negative for chest pain, palpitations and leg swelling.         Objective:   Physical Exam Vitals:   05/07/18 0856  BP: 124/68  Pulse: 79  SpO2: 97%  Weight: 131 lb 12.8 oz (59.8 kg)  Height: 5' 2.5" (1.588 m)   RA  Gen: well appearing HENT: OP clear, TM's clear, neck supple PULM: Coarse crackles in bases of lungs B, normal percussion CV: RRR, no mgr, trace edema GI: BS+, soft, nontender Derm: no cyanosis or rash Psyche: normal mood and affect    Chest imaging: 12 2015 CT chest> no pulmonary embolism, moderate centrilobular emphysema, there is interstitial septal thickening as well as honeycombing in a patchy and peripheral distribution with a clear craniocaudal gradient. This is consistent with usual interstitial pneumonitis.  August 2018 low-dose lung cancer screening CT: Showed emphysema, lung RA DS score 1, pulmonary fibrosis pattern indicative of nonspecific interstitial pneumonitis. No progression of fibrotic changes noted compared to the last CT.images independently reviewed, emphysema noted in the upper lobes, interlobular septal thickening and honeycombing in a peripheral distribution worse in the bases   Labs: 10/2014 ANA, Aldolase, CCP, SCL 70, SSA/SSB, Jo-1 all negative; however ESR 40, RF 17 (both elevated) August 2018 liver function testing normal May 2019 liver function testing within normal limits  PFT: 11/2014 PFT> Ratio 73%, FEV1 1.71L (98% pred), TLC 4.16L (87% pred), DLCO 8.31 (38% pred) October 2016 pulmonary function testing  ratio 83%, FEV1 2.00 L (113% predicted), FVC 2.41 L (106% predicted), total lung capacity 3.82 L (80% predicted) C, DLCO 9.51 (44% predicted). 06/2017 PFT October 2018 FVC 2.4 L 108% predicted, total lung capacity 4.72 L 99% predicted, DLCO 9.07 42% predicted May 2019 FVC 2.33 L 106% predicted, total lung capacity 4.2 L 88% predicted, DLCO 8.6 mL 40% predicted  6MW:  October 2016 distance 333 m, O2 saturation nadir 98% room air November 2018 distance 288 m, O2 saturation 98% room air May 2019  distance 298 m, O2 saturation 93% room air     Assessment & Plan:   IPF (idiopathic pulmonary fibrosis) ([place]) - Plan: Pulmonary function test  Centrilobular emphysema (HCC)  Cigarette smoker  Discussion: This has been a stable interval for Christine Meyer.  She has done a good job on cutting back on smoking but she still smoking a bit.  We encouraged her today to stop smoking altogether.  She is compliant with her Esbriet, I would like to change to the 1 pill 3 times a day option for her.  We will look into that today.  There is no evidence of liver toxicity based on the most recent blood work.  Plan: COPD: Get a high-dose flu shot when they are available Continue Symbicort 2 puffs twice a day Practice good hand hygiene Stay active  Idiopathic pulmonary fibrosis: Please contact us with the name of your specialty pharmacy so that we can change the prescription so that you only have to take 1 pill 3 times a day We will get a lung function test on the next visit  Cigarette smoking: Do your best to stop smoking: Keep the appointment for the lung cancer screening CT scan later this week  We will see you back in 3 months or sooner if needed    Current Outpatient Medications:  .  ACCU-CHEK SOFTCLIX LANCETS lancets, Check blood sugar once daily. Dx:E11.9, Disp: 100 each, Rfl: 5 .  alendronate (FOSAMAX) 70 MG tablet, TAKE 1 TABLET(70 MG) BY MOUTH 1 TIME A WEEK WITH A FULL GLASS OF WATER AND ON AN EMPTY STOMACH, Disp: 12 tablet, Rfl: 0 .  aspirin EC 81 MG tablet, Take 1 tablet (81 mg total) by mouth daily., Disp: 90 tablet, Rfl: 0 .  atorvastatin (LIPITOR) 40 MG tablet, Take 1 tablet (40 mg total) by mouth daily., Disp: 90 tablet, Rfl: 1 .  Blood Glucose Monitoring Suppl (ACCU-CHEK AVIVA PLUS) W/DEVICE KIT, Check Blood sugar daily. Dx E11.9, Disp: 1 kit, Rfl: 0 .  budesonide-formoterol (SYMBICORT) 160-4.5 MCG/ACT inhaler, Inhale 2 puffs into the lungs 2 (two) times daily., Disp: 1 Inhaler,  Rfl: 0 .  Calcium 500-100 MG-UNIT CHEW, CHEW AND SWALLOW ONE TABLET DAILY WITH BREAKFAST, Disp: 150 tablet, Rfl: 0 .  calcium carbonate (OS-CAL - DOSED IN MG OF ELEMENTAL CALCIUM) 1250 (500 Ca) MG tablet, Take 1 tablet (500 mg of elemental calcium total) by mouth daily with breakfast., Disp: 90 tablet, Rfl: 3 .  Cholecalciferol (VITAMIN D3) 2000 units capsule, Take 1 capsule (2,000 Units total) by mouth daily., Disp: 60 capsule, Rfl: 11 .  clopidogrel (PLAVIX) 75 MG tablet, TAKE 1 TABLET(75 MG) BY MOUTH DAILY, Disp: 90 tablet, Rfl: 2 .  docusate sodium (COLACE) 100 MG capsule, Take 1 capsule (100 mg total) by mouth every 12 (twelve) hours., Disp: 30 capsule, Rfl: 0 .  gabapentin (NEURONTIN) 100 MG capsule, Take 2 capsules (200 mg total) by mouth at bedtime., Disp: 60 capsule, Rfl: 3 .  meloxicam (MOBIC) 7.5 MG tablet, TAKE 1 TABLET(7.5 MG) BY MOUTH AS NEEDED, Disp: 30 tablet, Rfl: 0 .  metoprolol succinate (TOPROL-XL) 25 MG 24 hr tablet, TAKE 1 TABLET BY MOUTH DAILY, Disp: 90 tablet, Rfl: 1 .  Multiple Vitamins-Minerals (A THRU Z SELECT 50+ ADVANCED) TABS, TAKE 1 TABLET BY MOUTH DAILY, Disp: 220 tablet, Rfl: 1 .  olmesartan (BENICAR) 20 MG tablet, TAKE 1 TABLET(20 MG) BY MOUTH DAILY, Disp: 90 tablet, Rfl: 1 .  ondansetron (ZOFRAN ODT) 4 MG disintegrating tablet, 53m ODT q6 hours prn nausea/vomit, Disp: 6 tablet, Rfl: 0 .  ONETOUCH VERIO test strip, USE TO TEST BLOOD SUGAR ONCE DAILY, Disp: 75 each, Rfl: 0 .  pantoprazole (PROTONIX) 20 MG tablet, Take 1 tablet (20 mg total) by mouth daily before breakfast., Disp: 90 tablet, Rfl: 3 .  Pirfenidone (ESBRIET) 267 MG CAPS, TAKE 2 CAPSULES BY MOUTH THREE TIMES A DAY (Patient taking differently: TAKE 1 CAPSULE BY MOUTH THREE TIMES A DAY), Disp: 180 capsule, Rfl: 11 .  spironolactone (ALDACTONE) 25 MG tablet, TAKE 1 TABLET(25 MG) BY MOUTH DAILY, Disp: 90 tablet, Rfl: 1 .  spironolactone (ALDACTONE) 25 MG tablet, TAKE 1 TABLET(25 MG) BY MOUTH DAILY, Disp: 90  tablet, Rfl: 1 .  SYMBICORT 160-4.5 MCG/ACT inhaler, INHALE 2 PUFFS INTO THE LUNGS TWICE DAILY, Disp: 10.2 g, Rfl: 5 .  SYRINGE-NEEDLE, DISP, 3 ML 25G X 1" 3 ML MISC, Use to give b12 injection once a month, Disp: 12 each, Rfl: 0 .  traMADol (ULTRAM) 50 MG tablet, Take 1 tablet (50 mg total) by mouth every 6 (six) hours as needed., Disp: 15 tablet, Rfl: 0  Current Facility-Administered Medications:  .  0.9 %  sodium chloride infusion, 500 mL, Intravenous, Once, GCarlean PurlCOfilia Neas MD

## 2018-05-09 ENCOUNTER — Ambulatory Visit (HOSPITAL_BASED_OUTPATIENT_CLINIC_OR_DEPARTMENT_OTHER)
Admission: RE | Admit: 2018-05-09 | Discharge: 2018-05-09 | Disposition: A | Discharge status: Home / Self Care | Payer: Medicare Other | Source: Ambulatory Visit | Attending: Acute Care | Admitting: Acute Care

## 2018-05-09 DIAGNOSIS — I700 Atherosclerosis of aorta: Secondary | ICD-10-CM | POA: Diagnosis not present

## 2018-05-09 DIAGNOSIS — I2510 Atherosclerotic heart disease of native coronary artery without angina pectoris: Secondary | ICD-10-CM | POA: Diagnosis not present

## 2018-05-09 DIAGNOSIS — F17210 Nicotine dependence, cigarettes, uncomplicated: Secondary | ICD-10-CM | POA: Diagnosis not present

## 2018-05-09 DIAGNOSIS — J432 Centrilobular emphysema: Secondary | ICD-10-CM | POA: Diagnosis not present

## 2018-05-09 DIAGNOSIS — Z122 Encounter for screening for malignant neoplasm of respiratory organs: Secondary | ICD-10-CM

## 2018-05-09 DIAGNOSIS — R918 Other nonspecific abnormal finding of lung field: Secondary | ICD-10-CM | POA: Insufficient documentation

## 2018-05-16 ENCOUNTER — Other Ambulatory Visit: Payer: Self-pay | Admitting: Acute Care

## 2018-05-16 DIAGNOSIS — F17210 Nicotine dependence, cigarettes, uncomplicated: Principal | ICD-10-CM

## 2018-05-16 DIAGNOSIS — Z122 Encounter for screening for malignant neoplasm of respiratory organs: Secondary | ICD-10-CM

## 2018-06-09 ENCOUNTER — Other Ambulatory Visit: Payer: Self-pay | Admitting: Family Medicine

## 2018-06-09 DIAGNOSIS — M79672 Pain in left foot: Secondary | ICD-10-CM | POA: Diagnosis not present

## 2018-06-09 DIAGNOSIS — E1159 Type 2 diabetes mellitus with other circulatory complications: Secondary | ICD-10-CM | POA: Diagnosis not present

## 2018-06-09 DIAGNOSIS — L84 Corns and callosities: Secondary | ICD-10-CM | POA: Diagnosis not present

## 2018-06-09 DIAGNOSIS — M79671 Pain in right foot: Secondary | ICD-10-CM | POA: Diagnosis not present

## 2018-06-09 DIAGNOSIS — B351 Tinea unguium: Secondary | ICD-10-CM | POA: Diagnosis not present

## 2018-06-19 ENCOUNTER — Other Ambulatory Visit: Payer: Self-pay | Admitting: Family Medicine

## 2018-06-19 DIAGNOSIS — E785 Hyperlipidemia, unspecified: Secondary | ICD-10-CM

## 2018-06-20 ENCOUNTER — Telehealth: Payer: Self-pay | Admitting: *Deleted

## 2018-06-20 IMAGING — DX DG ELBOW COMPLETE 3+V*L*
4 series · 4 of 4 positions shown · non-contrast
Comparison: None.

CLINICAL DATA: 66-year-old female with left upper extremity pain.

EXAM:
LEFT SHOULDER - 2+ VIEW; LEFT ELBOW - COMPLETE 3+ VIEW

[elbow ap]
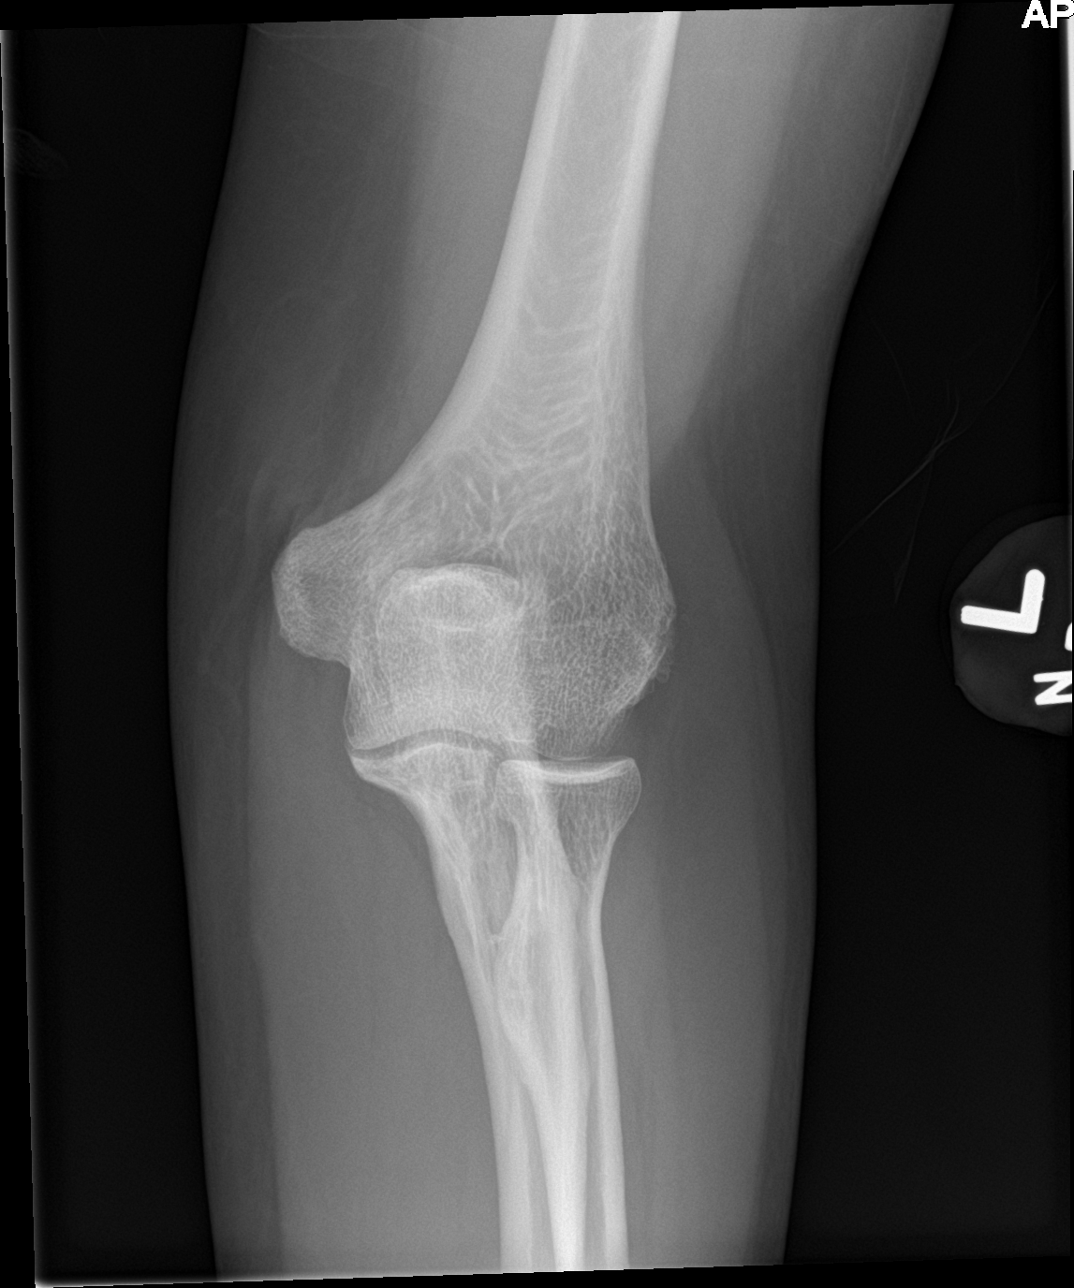

[elbow obl (1 of 2)]
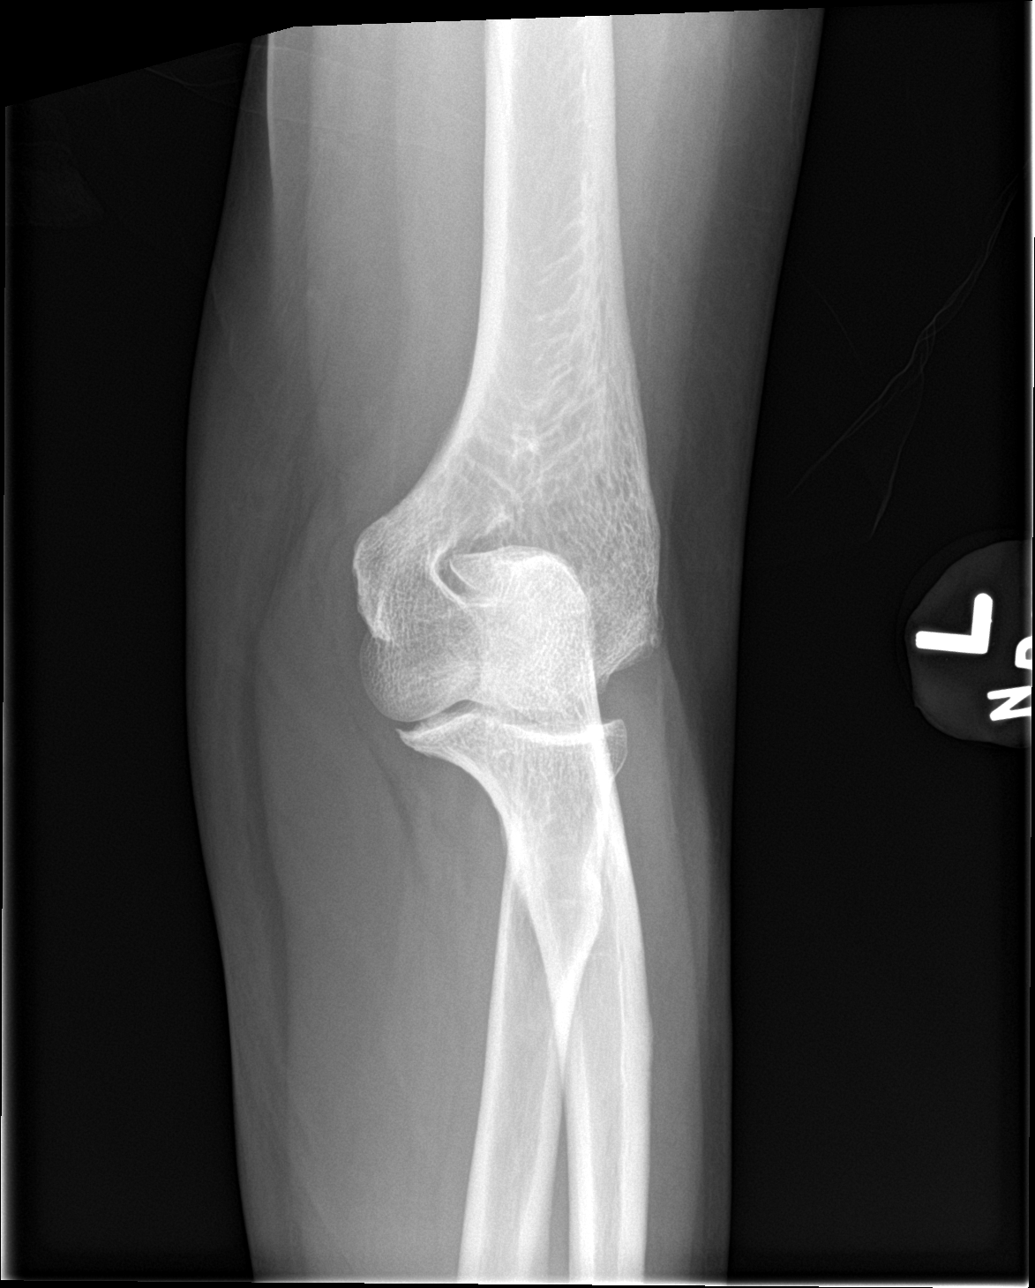

[elbow obl (2 of 2)]
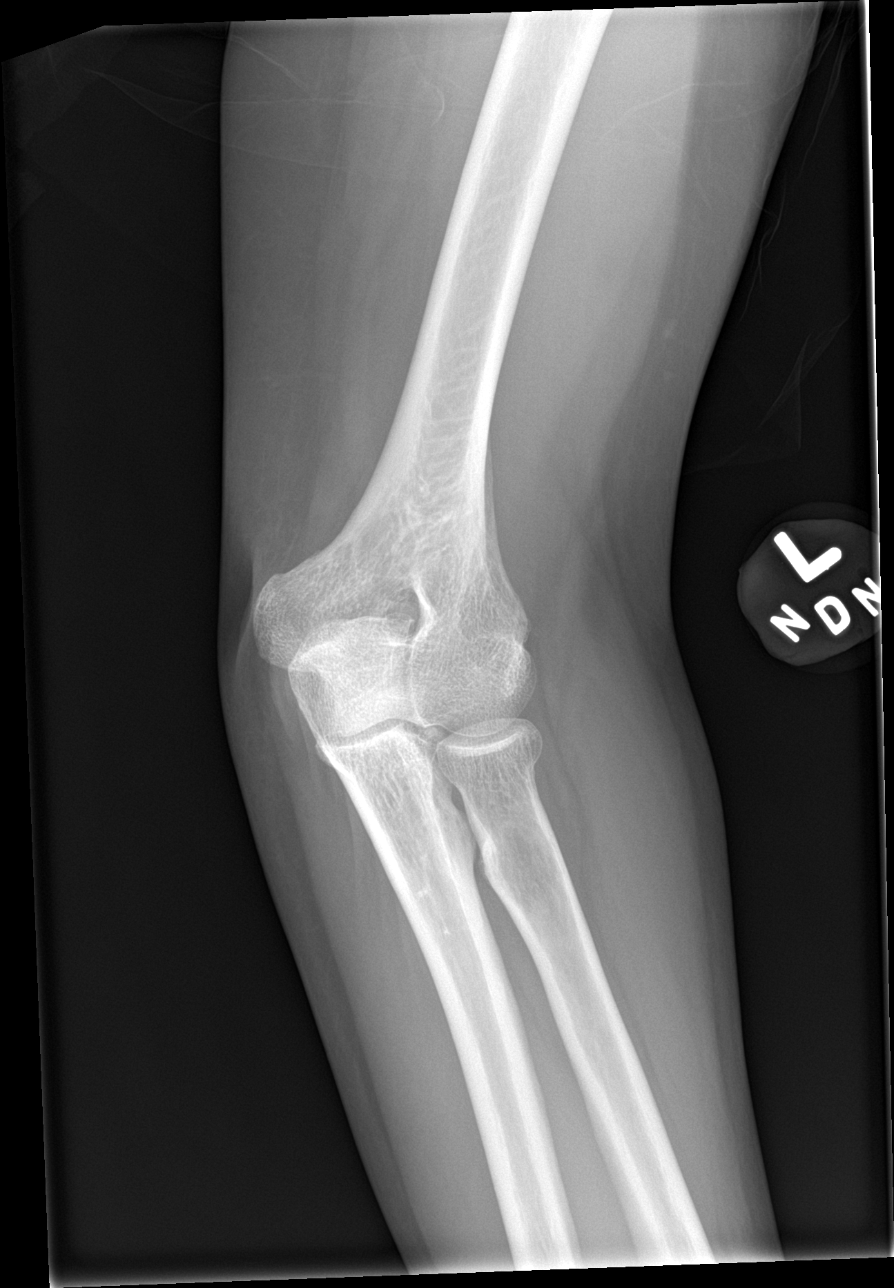

[elbow lat]
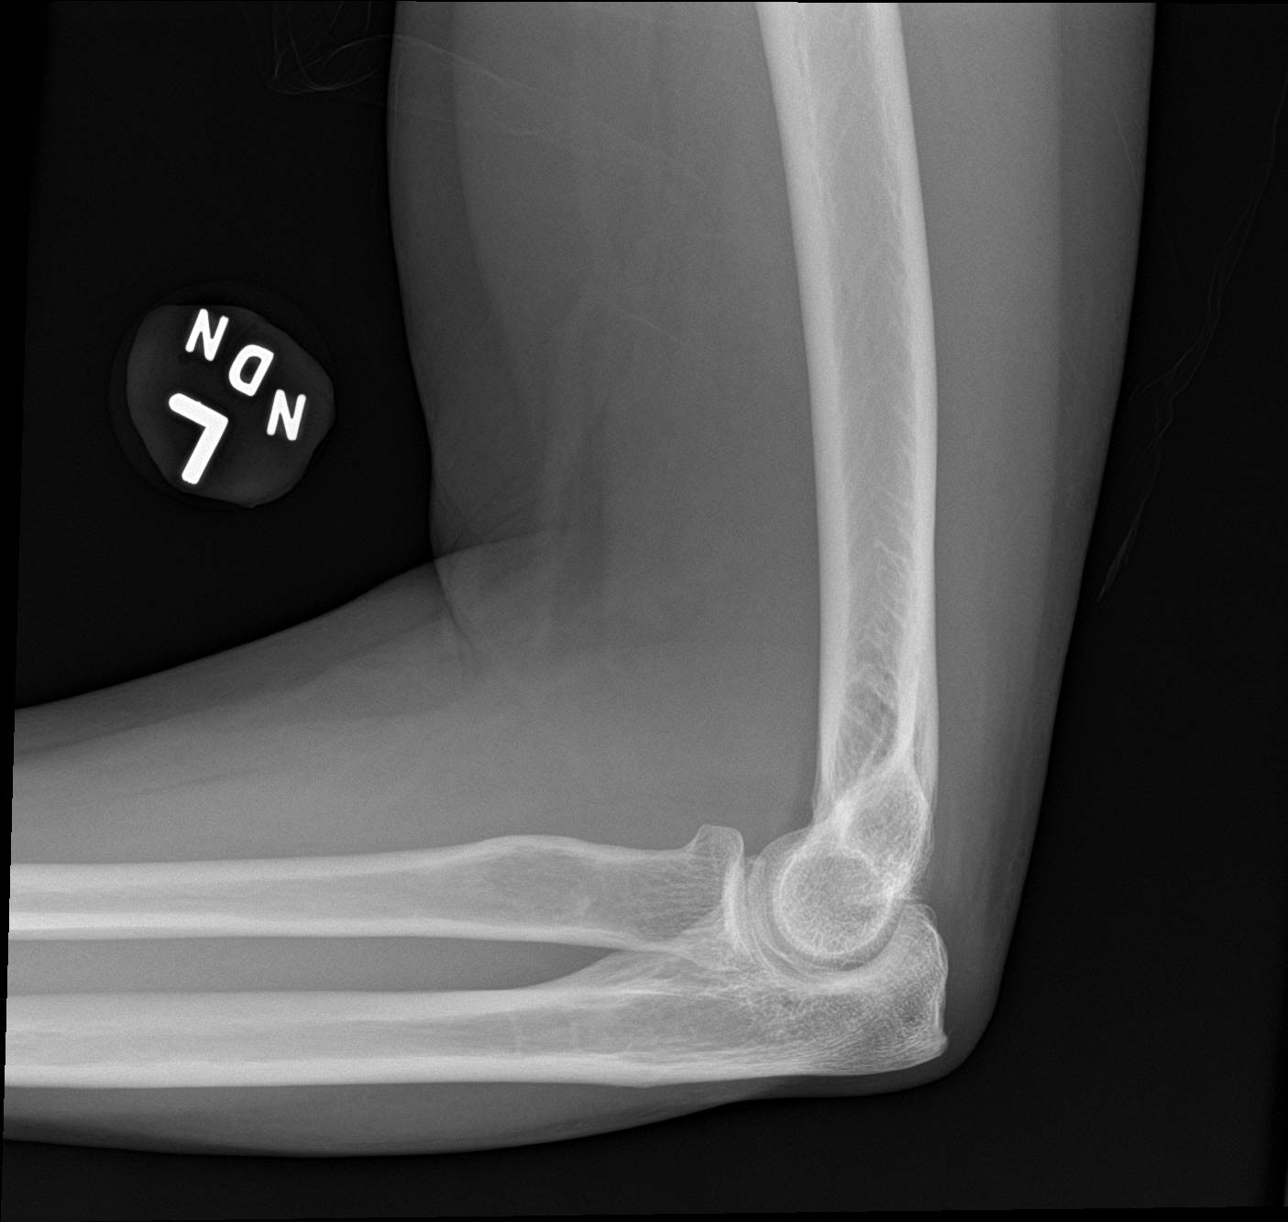

[4 of 4 positions shown; findings below may reference images not displayed]

FINDINGS: There is no acute fracture or dislocation of the left shoulder or
left elbow. No significant arthritic changes. There is mild
osteopenia. No effusion. The soft tissues appear unremarkable.

Mild diffuse interstitial coarsening of the visualized lungs,
chronic.
IMPRESSION: Negative.

## 2018-06-20 NOTE — Telephone Encounter (Signed)
Can you call and schedule follow up visit.

## 2018-06-20 NOTE — Telephone Encounter (Signed)
Copied from [place] [id number]. Topic: Appointment Scheduling - Scheduling Inquiry for Clinic >> Jun 19, 2018  3:30 PM Rutherford Nail, [place] wrote: Reason for CRM: Patient received Emmi Prevent call regarding an A1C and diabetes check. Patient would like to schedule. Please advise.

## 2018-06-20 NOTE — Telephone Encounter (Signed)
Pt scheduled for 06/26/18 @ 1:30pm

## 2018-06-26 ENCOUNTER — Ambulatory Visit (INDEPENDENT_AMBULATORY_CARE_PROVIDER_SITE_OTHER): Payer: Medicare Other | Admitting: Family Medicine

## 2018-06-26 VITALS — BP 156/58 | HR 59 | Temp 97.7°F | Resp 16 | Ht 63.0 in | Wt 133.4 lb

## 2018-06-26 DIAGNOSIS — I10 Essential (primary) hypertension: Secondary | ICD-10-CM | POA: Diagnosis not present

## 2018-06-26 DIAGNOSIS — E785 Hyperlipidemia, unspecified: Secondary | ICD-10-CM

## 2018-06-26 DIAGNOSIS — E1151 Type 2 diabetes mellitus with diabetic peripheral angiopathy without gangrene: Secondary | ICD-10-CM | POA: Diagnosis not present

## 2018-06-26 DIAGNOSIS — Z23 Encounter for immunization: Secondary | ICD-10-CM

## 2018-06-26 DIAGNOSIS — E538 Deficiency of other specified B group vitamins: Secondary | ICD-10-CM

## 2018-06-26 NOTE — Progress Notes (Signed)
Patient ID: Christine Meyer, female    DOB: 1950-12-17  Age: 67 y.o. MRN: [id number]    Subjective:  Subjective  HPI Kirra Verga presents for f/u dm, chol and bp. No complaints.  HPI HYPERTENSION   Blood pressure range-not checking   Chest pain- no      Dyspea- no Lightheadedness- no   Edema- no  Other side effects - no   Medication compliance: good Low salt diet- yes    DIABETES    Blood Sugar ranges-90-120  Polyuria- no New Visual problems- no  Hypoglycemic symptoms- no  Other side effects-no Medication compliance - good Last eye exam- recent Foot exam- today   HYPERLIPIDEMIA  Medication compliance- good RUQ pain- no  Muscle aches- no Other side effects-no      Review of Systems  Constitutional: Negative for appetite change, diaphoresis, fatigue and unexpected weight change.  Eyes: Negative for pain, redness and visual disturbance.  Respiratory: Negative for cough, chest tightness, shortness of breath and wheezing.   Cardiovascular: Negative for chest pain, palpitations and leg swelling.  Endocrine: Negative for cold intolerance, heat intolerance, polydipsia, polyphagia and polyuria.  Genitourinary: Negative for difficulty urinating, dysuria and frequency.  Neurological: Negative for dizziness, light-headedness, numbness and headaches.    History Past Medical History:  Diagnosis Date  . Anemia    NOS  . CAD (coronary artery disease)    Stent RCA 1999  . COPD (chronic obstructive pulmonary disease) ([place])   . Diabetes mellitus, type 2 (HCC)    Diet controlled  . Gastric polyps - hyperplastic 02/23/2018  . Hyperlipidemia   . Hypertension   . OSA (obstructive sleep apnea) [name]   . PVD (peripheral vascular disease) (HCC)    Carotid stenosis, renal artery stenosis  . Sleep apnea    dx'd but doesn't use mask   . Uterine cancer ([place]) ?[id number]    She has a past surgical history that includes Renal artery stent (Right, May 2007; 08/2006); Coronary  angioplasty with stent (1999); Colonoscopy (2000); Iliac artery stent (Right, 11/22/2014); Total abdominal hysterectomy (?1980's); lower extremity angiogram (N/A, 11/22/2014); and abdominal angiogram (11/22/2014).   Her family history includes Breast cancer in her daughter; Deep vein thrombosis in her mother; Diabetes in her brother, maternal aunt, maternal grandmother, mother, and sister; Emphysema in her brother; Heart attack in her paternal aunt; Heart failure in her mother; Hypertension in her brother, father, and mother.She reports that she has been smoking cigarettes. She has a 5.10 pack-year smoking history. She has never used smokeless tobacco. She reports that she does not drink alcohol or use drugs.  Current Outpatient Medications on File Prior to Visit  Medication Sig Dispense Refill  . ACCU-CHEK SOFTCLIX LANCETS lancets Check blood sugar once daily. Dx:E11.9 100 each 5  . alendronate (FOSAMAX) 70 MG tablet TAKE 1 TABLET(70 MG) BY MOUTH 1 TIME A WEEK WITH A FULL GLASS OF WATER AND ON AN EMPTY STOMACH 12 tablet 3  . aspirin EC 81 MG tablet Take 1 tablet (81 mg total) by mouth daily. 90 tablet 0  . atorvastatin (LIPITOR) 40 MG tablet TAKE 1 TABLET BY MOUTH  DAILY 90 tablet 1  . Blood Glucose Monitoring Suppl (ACCU-CHEK AVIVA PLUS) W/DEVICE KIT Check Blood sugar daily. Dx E11.9 1 kit 0  . budesonide-formoterol (SYMBICORT) 160-4.5 MCG/ACT inhaler Inhale 2 puffs into the lungs 2 (two) times daily. 1 Inhaler 0  . Calcium 500-100 MG-UNIT CHEW CHEW AND SWALLOW ONE TABLET DAILY WITH BREAKFAST 150 tablet 0  .  calcium carbonate (OS-CAL - DOSED IN MG OF ELEMENTAL CALCIUM) 1250 (500 Ca) MG tablet Take 1 tablet (500 mg of elemental calcium total) by mouth daily with breakfast. 90 tablet 3  . Cholecalciferol (VITAMIN D3) 2000 units capsule Take 1 capsule (2,000 Units total) by mouth daily. 60 capsule 11  . clopidogrel (PLAVIX) 75 MG tablet TAKE 1 TABLET(75 MG) BY MOUTH DAILY 90 tablet 2  . docusate sodium  (COLACE) 100 MG capsule Take 1 capsule (100 mg total) by mouth every 12 (twelve) hours. 30 capsule 0  . gabapentin (NEURONTIN) 100 MG capsule Take 2 capsules (200 mg total) by mouth at bedtime. 60 capsule 3  . meloxicam (MOBIC) 7.5 MG tablet TAKE 1 TABLET(7.5 MG) BY MOUTH AS NEEDED 30 tablet 0  . metoprolol succinate (TOPROL-XL) 25 MG 24 hr tablet TAKE 1 TABLET BY MOUTH DAILY 90 tablet 1  . Multiple Vitamins-Minerals (A THRU Z SELECT 50+ ADVANCED) TABS TAKE 1 TABLET BY MOUTH DAILY 220 tablet 1  . olmesartan (BENICAR) 20 MG tablet TAKE 1 TABLET(20 MG) BY MOUTH DAILY 90 tablet 1  . ondansetron (ZOFRAN ODT) 4 MG disintegrating tablet 8m ODT q6 hours prn nausea/vomit 6 tablet 0  . ONETOUCH VERIO test strip USE TO TEST BLOOD SUGAR ONCE DAILY 75 each 0  . pantoprazole (PROTONIX) 20 MG tablet Take 1 tablet (20 mg total) by mouth daily before breakfast. 90 tablet 3  . Pirfenidone (ESBRIET) 267 MG CAPS TAKE 2 CAPSULES BY MOUTH THREE TIMES A DAY (Patient taking differently: TAKE 1 CAPSULE BY MOUTH THREE TIMES A DAY) 180 capsule 11  . spironolactone (ALDACTONE) 25 MG tablet TAKE 1 TABLET(25 MG) BY MOUTH DAILY 90 tablet 1  . spironolactone (ALDACTONE) 25 MG tablet TAKE 1 TABLET(25 MG) BY MOUTH DAILY 90 tablet 1  . SYMBICORT 160-4.5 MCG/ACT inhaler INHALE 2 PUFFS INTO THE LUNGS TWICE DAILY 10.2 g 5  . SYRINGE-NEEDLE, DISP, 3 ML 25G X 1" 3 ML MISC Use to give b12 injection once a month 12 each 0  . traMADol (ULTRAM) 50 MG tablet Take 1 tablet (50 mg total) by mouth every 6 (six) hours as needed. 15 tablet 0   Current Facility-Administered Medications on File Prior to Visit  Medication Dose Route Frequency Provider Last Rate Last Dose  . 0.9 %  sodium chloride infusion  500 mL Intravenous Once GGatha Mayer MD         Objective:  Objective  Physical Exam  Constitutional: She is oriented to person, place, and time. She appears well-developed and well-nourished.  HENT:  Head: Normocephalic and  atraumatic.  Eyes: Conjunctivae and EOM are normal.  Neck: Normal range of motion. Neck supple. No JVD present. Carotid bruit is not present. No thyromegaly present.  Cardiovascular: Normal rate, regular rhythm and normal heart sounds.  No murmur heard. Pulmonary/Chest: Effort normal and breath sounds normal. No respiratory distress. She has no wheezes. She has no rales. She exhibits no tenderness.  Musculoskeletal: She exhibits no edema.  Neurological: She is alert and oriented to person, place, and time.  Psychiatric: She has a normal mood and affect.  Nursing note and vitals reviewed.  Diabetic Foot Exam - Simple   Simple Foot Form Diabetic Foot exam was performed with the following findings:  Yes 06/28/2018  1:45 PM  Visual Inspection No deformities, no ulcerations, no other skin breakdown bilaterally:  Yes Sensation Testing Intact to touch and monofilament testing bilaterally:  Yes Pulse Check Posterior Tibialis and Dorsalis pulse intact bilaterally:  Yes Comments     BP (!) 156/58   Pulse (!) 59   Temp 97.7 F (36.5 C) (Oral)   Resp 16   Ht 5' 3"  (1.6 m)   Wt 133 lb 6.4 oz (60.5 kg)   SpO2 95%   BMI 23.63 kg/m  Wt Readings from Last 3 Encounters:  06/26/18 133 lb 6.4 oz (60.5 kg)  05/07/18 131 lb 12.8 oz (59.8 kg)  03/27/18 132 lb (59.9 kg)     Lab Results  Component Value Date   WBC 6.1 02/28/2018   HGB 10.4 (L) 02/28/2018   HCT 30.4 (L) 02/28/2018   PLT 232 02/28/2018   GLUCOSE 91 06/26/2018   CHOL 163 06/26/2018   TRIG 87.0 06/26/2018   HDL 61.90 06/26/2018   LDLCALC 83 06/26/2018   ALT 21 06/26/2018   AST 23 06/26/2018   NA 140 06/26/2018   K 4.1 06/26/2018   CL 103 06/26/2018   CREATININE 1.19 06/26/2018   BUN 18 06/26/2018   CO2 24 06/26/2018   TSH 1.41 04/22/2017   INR 0.95 11/11/2014   HGBA1C 6.1 06/26/2018   MICROALBUR <0.7 03/10/2015    Ct Chest Lung Ca Screen Low Dose W/o Cm  Result Date: 05/09/2018 CLINICAL DATA:  67 year old  female current smoker with 30 pack-year history of smoking. Lung cancer screening examination. EXAM: CT CHEST WITHOUT CONTRAST LOW-DOSE FOR LUNG CANCER SCREENING TECHNIQUE: Multidetector CT imaging of the chest was performed following the standard protocol without IV contrast. COMPARISON:  Low-dose lung cancer screening chest CT 05/08/2017. FINDINGS: Cardiovascular: Heart size is normal. There is no significant pericardial fluid, thickening or pericardial calcification. There is aortic atherosclerosis, as well as atherosclerosis of the great vessels of the mediastinum and the coronary arteries, including calcified atherosclerotic plaque in the left main, left anterior descending, left circumflex and right coronary arteries. Mediastinum/Nodes: No pathologically enlarged mediastinal or hilar lymph nodes. Please note that accurate exclusion of hilar adenopathy is limited on noncontrast CT scans. Esophagus is unremarkable in appearance. No axillary lymphadenopathy. Lungs/Pleura: Tiny pulmonary nodules noted in the lungs bilaterally, largest of which is near the apex of the right upper lobe (axial image 52 of series 3), with a volume derived mean diameter of only 2.9 mm. No larger more suspicious appearing pulmonary nodules or masses are noted. No acute consolidative airspace disease. No pleural effusions. Diffuse bronchial wall thickening with moderate centrilobular and paraseptal emphysema. In addition, there is widespread septal thickening, peripheral bronchiectasis and bronchiolectasis, and areas of honeycombing throughout the mid to lower lungs bilaterally, slightly progressive compared to prior examinations. Upper Abdomen: Aortic atherosclerosis. Musculoskeletal: There are no aggressive appearing lytic or blastic lesions noted in the visualized portions of the skeleton. IMPRESSION: 1. Lung-RADS 2S, benign appearance or behavior. Continue annual screening with low-dose chest CT without contrast in 12 months. 2. The  "S" modifier above refers to potentially clinically significant non lung cancer related findings. Specifically, there is aortic atherosclerosis, in addition to left main and 3 vessel coronary artery disease. Please note that although the presence of coronary artery calcium documents the presence of coronary artery disease, the severity of this disease and any potential stenosis cannot be assessed on this non-gated CT examination. Assessment for potential risk factor modification, dietary therapy or pharmacologic therapy may be warranted, if clinically indicated. 3. Mild diffuse bronchial wall thickening with mild centrilobular and paraseptal emphysema; imaging findings suggestive of underlying COPD. 4. Slight progression of interstitial lung changes throughout the mid to lower lungs bilaterally,  strongly favored to reflect usual interstitial pneumonia (UIP). Outpatient referral to Pulmonology for further evaluation is suggested. Aortic Atherosclerosis (ICD10-I70.0) and Emphysema (ICD10-J43.9). Electronically Signed   By: Vinnie Langton M.D.   On: 05/09/2018 16:23     Assessment & Plan:  Plan  I am having Deina Lascala maintain her ACCU-CHEK AVIVA PLUS, ACCU-CHEK SOFTCLIX LANCETS, A THRU Z SELECT 50+ ADVANCED, aspirin EC, Pirfenidone, calcium carbonate, ONETOUCH VERIO, clopidogrel, traMADol, docusate sodium, Vitamin D3, spironolactone, ondansetron, olmesartan, pantoprazole, SYRINGE-NEEDLE (DISP) 3 ML, SYMBICORT, budesonide-formoterol, gabapentin, meloxicam, Calcium, spironolactone, metoprolol succinate, alendronate, and atorvastatin. We will continue to administer sodium chloride.  No orders of the defined types were placed in this encounter.   Problem List Items Addressed This Visit      Unprioritized   DM (diabetes mellitus) type II, controlled, with peripheral vascular disorder ([place]) - Primary    hgba1c to be done minimize simple carbs. Increase exercise as tolerated. Continue current meds        Relevant Orders   Hemoglobin A1c (Completed)   Comprehensive metabolic panel (Completed)   Lipid panel (Completed)   Essential hypertension    Well controlled, no changes to meds. Encouraged heart healthy diet such as the DASH diet and exercise as tolerated.       Relevant Orders   Hemoglobin A1c (Completed)   Comprehensive metabolic panel (Completed)   Lipid panel (Completed)   Hyperlipidemia LDL goal <70    Encouraged heart healthy diet, increase exercise, avoid trans fats, consider a krill oil cap daily      Relevant Orders   Hemoglobin A1c (Completed)   Comprehensive metabolic panel (Completed)   Lipid panel (Completed)    Other Visit Diagnoses    Vitamin B 12 deficiency       Relevant Orders   Vitamin B12 (Completed)      Follow-up: Return in about 6 months (around 12/26/2018) for fasting, annual exam.  Ann Held, DO

## 2018-06-26 NOTE — Patient Instructions (Signed)

## 2018-06-27 LAB — LIPID PANEL
CHOL/HDL RATIO: 3
Cholesterol: 163 mg/dL (ref 0–200)
HDL: 61.9 mg/dL (ref >39.00)
LDL Cholesterol: 83 mg/dL (ref 0–99)
NONHDL: 100.83
Triglycerides: 87 mg/dL (ref 0.0–149.0)
VLDL: 17.4 mg/dL (ref 0.0–40.0)

## 2018-06-27 LAB — COMPREHENSIVE METABOLIC PANEL
ALK PHOS: 63 U/L (ref 39–117)
ALT: 21 U/L (ref 0–35)
AST: 23 U/L (ref 0–37)
Albumin: 4.3 g/dL (ref 3.5–5.2)
BILIRUBIN TOTAL: 0.4 mg/dL (ref 0.2–1.2)
BUN: 18 mg/dL (ref 6–23)
CO2: 24 meq/L (ref 19–32)
CREATININE: 1.19 mg/dL (ref 0.40–1.20)
Calcium: 9.6 mg/dL (ref 8.4–10.5)
Chloride: 103 mEq/L (ref 96–112)
GFR: 57.79 mL/min — ABNORMAL LOW (ref >60.00)
GLUCOSE: 91 mg/dL (ref 70–99)
Potassium: 4.1 mEq/L (ref 3.5–5.1)
Sodium: 140 mEq/L (ref 135–145)
TOTAL PROTEIN: 7.1 g/dL (ref 6.0–8.3)

## 2018-06-27 LAB — VITAMIN B12: VITAMIN B 12: 711 pg/mL (ref 211–911)

## 2018-06-27 LAB — HEMOGLOBIN A1C: HEMOGLOBIN A1C: 6.1 % (ref 4.6–6.5)

## 2018-06-28 ENCOUNTER — Encounter: Payer: Self-pay | Admitting: Family Medicine

## 2018-06-28 DIAGNOSIS — E785 Hyperlipidemia, unspecified: Secondary | ICD-10-CM | POA: Insufficient documentation

## 2018-06-28 NOTE — Assessment & Plan Note (Signed)
hgba1c to be done minimize simple carbs. Increase exercise as tolerated. Continue current meds  

## 2018-06-28 NOTE — Assessment & Plan Note (Signed)
Well controlled, no changes to meds. Encouraged heart healthy diet such as the DASH diet and exercise as tolerated.  °

## 2018-06-28 NOTE — Assessment & Plan Note (Addendum)
Encouraged heart healthy diet, increase exercise, avoid trans fats, consider a krill oil cap daily 

## 2018-06-30 NOTE — Addendum Note (Signed)
Addended by: Kem Boroughs D on: 06/30/2018 10:52 AM   Modules accepted: Orders

## 2018-07-24 ENCOUNTER — Other Ambulatory Visit: Payer: Self-pay | Admitting: Cardiology

## 2018-07-24 ENCOUNTER — Other Ambulatory Visit: Payer: Self-pay | Admitting: Family Medicine

## 2018-08-22 ENCOUNTER — Telehealth: Payer: Self-pay | Admitting: *Deleted

## 2018-08-22 NOTE — Telephone Encounter (Signed)
Copied from [place] [id number]. Topic: General - Other >> Aug 21, 2018 12:23 PM Christine Meyer wrote: Reason for CRM: Patient called to say that she had Meyer missed call but nothing noted in chart.

## 2018-08-27 NOTE — Telephone Encounter (Signed)
I do not see anything in chart.  Left message on machine.

## 2018-08-28 ENCOUNTER — Other Ambulatory Visit: Payer: Self-pay | Admitting: Cardiovascular Disease

## 2018-08-28 ENCOUNTER — Ambulatory Visit (HOSPITAL_COMMUNITY)
Admission: RE | Admit: 2018-08-28 | Discharge: 2018-08-28 | Disposition: A | Discharge status: Home / Self Care | Payer: Medicare Other | Source: Ambulatory Visit | Attending: Cardiology | Admitting: Cardiology

## 2018-08-28 ENCOUNTER — Ambulatory Visit (HOSPITAL_BASED_OUTPATIENT_CLINIC_OR_DEPARTMENT_OTHER)
Admission: RE | Admit: 2018-08-28 | Discharge: 2018-08-28 | Disposition: A | Discharge status: Home / Self Care | Payer: Medicare Other | Source: Ambulatory Visit | Attending: Cardiovascular Disease | Admitting: Cardiovascular Disease

## 2018-08-28 DIAGNOSIS — I739 Peripheral vascular disease, unspecified: Secondary | ICD-10-CM

## 2018-08-28 DIAGNOSIS — Z95820 Peripheral vascular angioplasty status with implants and grafts: Secondary | ICD-10-CM

## 2018-08-29 ENCOUNTER — Encounter: Payer: Self-pay | Admitting: Cardiovascular Disease

## 2018-08-29 ENCOUNTER — Ambulatory Visit (INDEPENDENT_AMBULATORY_CARE_PROVIDER_SITE_OTHER): Payer: Medicare Other | Admitting: Cardiovascular Disease

## 2018-08-29 VITALS — BP 128/52 | HR 78 | Ht 61.5 in | Wt 134.0 lb

## 2018-08-29 DIAGNOSIS — I2510 Atherosclerotic heart disease of native coronary artery without angina pectoris: Secondary | ICD-10-CM | POA: Diagnosis not present

## 2018-08-29 DIAGNOSIS — I739 Peripheral vascular disease, unspecified: Secondary | ICD-10-CM | POA: Diagnosis not present

## 2018-08-29 NOTE — Assessment & Plan Note (Signed)
History of remote right renal artery stenting with angiogram performed 11/22/2014 demonstrating her right renal artery stent to be totally occluded.

## 2018-08-29 NOTE — Patient Instructions (Signed)
Medication Instructions:  Your physician recommends that you continue on your current medications as directed. Please refer to the Current Medication list given to you today.  If you need a refill on your cardiac medications before your next appointment, please call your pharmacy.   Lab work: None ordered If you have labs (blood work) drawn today and your tests are completely normal, you will receive your results only by: Marland Kitchen MyChart Message (if you have MyChart) OR . A paper copy in the mail If you have any lab test that is abnormal or we need to change your treatment, we will call you to review the results.  Testing/Procedures: Your physician has requested that you have a lower extremity arterial exercise duplex. During this test, exercise and ultrasound are used to evaluate arterial blood flow in the legs. Allow one hour for this exam. There are no restrictions or special instructions. (To be scheduled in 1 year)  Your physician has requested that you have an ankle brachial index (ABI). During this test an ultrasound and blood pressure cuff are used to evaluate the arteries that supply the arms and legs with blood. Allow thirty minutes for this exam. There are no restrictions or special instructions. (To be scheduled in 1 year)  Follow-Up: At [facility], you and your health needs are our priority.  As part of our continuing mission to provide you with exceptional heart care, we have created designated Provider Care Teams.  These Care Teams include your primary Cardiologist (physician) and Advanced Practice Providers (APPs -  Physician Assistants and Nurse Practitioners) who all work together to provide you with the care you need, when you need it. You will need a follow up appointment in 12 months.  Please call our office 2 months in advance to schedule this appointment.  You may see Quay Burow, MD or one of the following Advanced Practice Providers on your designated Care Team:   Kerin Ransom, PA-C Roby Lofts, [place] . Sande Rives, PA-C  Any Other Special Instructions Will Be Listed Below (If Applicable).

## 2018-08-29 NOTE — Assessment & Plan Note (Signed)
History of PAD status post remote right renal artery stenting and right external iliac artery stenting.  I re-angiogram to her 11/22/2014 revealing an occluded right renal artery stent high-grade ostial right common iliac artery stenosis as well as right external iliac artery "in-stent restenosis".  I stented both of her areas in her right iliac system and got an excellent result.  She did have an occluded left SFA with high-grade segmental use right SFA stenosis.  Her Dopplers have remained stable since suggesting patent iliac stents and she denies claudication.

## 2018-08-29 NOTE — Progress Notes (Signed)
08/29/2018 Christine Meyer   1951/09/08  [id number]  Primary Physician Ann Held, DO Primary Cardiologist: Lorretta Harp MD [name], [place]  HPI:  Christine Meyer is a 67 y.o.   thin appearing single African-American female mother of 4 children . She was referred by Dr. Percival Spanish , her cardiologist, for peripheral vascular evaluation. I last saw her in the office 07/09/2017.She has a history of myocardial infarction back in 1999 undergoing stenting of her RCA by Dr. Melvern Banker.her chronic risk factors are notable for continued tobacco abuse, treated hypertension and hyperlipidemia. She is complained of increasing dyspnea on exertion over the last 6 months as well as right calf claudication. Dr. Percival Spanish saw her and ordered a stress test. 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 She has had right renal artery stenting in the past as well as right external iliac artery stenting as well. 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 I stented both of these areas. She did have an occluded left SFA with high-grade segmental diffuse right SFA stenosis. Her Dopplers and symptoms improved. Since I saw her a year ago she's remained currently stable. She continues to deny claudication. Her most recent lipid profile performed 06/26/2018 revealed a total cholesterol of 163, LDL of 83 and HDL of 61.  Current Meds  Medication Sig  . ACCU-CHEK SOFTCLIX LANCETS lancets Check blood sugar once daily. Dx:E11.9  . alendronate (FOSAMAX) 70 MG tablet TAKE 1 TABLET(70 MG) BY MOUTH 1 TIME A WEEK WITH A FULL GLASS OF WATER AND ON AN EMPTY STOMACH  . aspirin EC 81 MG tablet Take 1 tablet (81 mg total) by mouth daily.  Marland Kitchen atorvastatin (LIPITOR) 40 MG tablet TAKE 1 TABLET BY MOUTH  DAILY  . Blood  Glucose Monitoring Suppl (ACCU-CHEK AVIVA PLUS) W/DEVICE KIT Check Blood sugar daily. Dx E11.9  . budesonide-formoterol (SYMBICORT) 160-4.5 MCG/ACT inhaler Inhale 2 puffs into the lungs 2 (two) times daily.  . Calcium 500-100 MG-UNIT CHEW CHEW AND SWALLOW ONE TABLET DAILY WITH BREAKFAST  . calcium carbonate (OS-CAL - DOSED IN MG OF ELEMENTAL CALCIUM) 1250 (500 Ca) MG tablet Take 1 tablet (500 mg of elemental calcium total) by mouth daily with breakfast.  . Cholecalciferol (VITAMIN D3) 2000 units capsule Take 1 capsule (2,000 Units total) by mouth daily.  . clopidogrel (PLAVIX) 75 MG tablet TAKE 1 TABLET(75 MG) BY MOUTH DAILY  . docusate sodium (COLACE) 100 MG capsule Take 1 capsule (100 mg total) by mouth every 12 (twelve) hours.  . gabapentin (NEURONTIN) 100 MG capsule Take 2 capsules (200 mg total) by mouth at bedtime.  . meloxicam (MOBIC) 7.5 MG tablet TAKE 1 TABLET(7.5 MG) BY MOUTH AS NEEDED  . metoprolol succinate (TOPROL-XL) 25 MG 24 hr tablet TAKE 1 TABLET BY MOUTH DAILY  . Multiple Vitamins-Minerals (A THRU Z SELECT 50+ ADVANCED) TABS TAKE 1 TABLET BY MOUTH DAILY  . olmesartan (BENICAR) 20 MG tablet TAKE 1 TABLET(20 MG) BY MOUTH DAILY  . ondansetron (ZOFRAN ODT) 4 MG disintegrating tablet 64m ODT q6 hours prn nausea/vomit  . ONETOUCH VERIO test strip USE TO TEST BLOOD SUGAR ONCE DAILY  . pantoprazole (PROTONIX) 20 MG tablet Take 1 tablet (20 mg total) by mouth daily before breakfast.  . Pirfenidone (ESBRIET) 267 MG CAPS TAKE 2 CAPSULES BY MOUTH THREE  TIMES A DAY (Patient taking differently: TAKE 1 CAPSULE BY MOUTH THREE TIMES A DAY)  . spironolactone (ALDACTONE) 25 MG tablet TAKE 1 TABLET(25 MG) BY MOUTH DAILY  . SYMBICORT 160-4.5 MCG/ACT inhaler INHALE 2 PUFFS INTO THE LUNGS TWICE DAILY  . SYRINGE-NEEDLE, DISP, 3 ML 25G X 1" 3 ML MISC Use to give b12 injection once a month  . traMADol (ULTRAM) 50 MG tablet Take 1 tablet (50 mg total) by mouth every 6 (six) hours as needed.   Current  Facility-Administered Medications for the 08/29/18 encounter (Office Visit) with Lorretta Harp, MD  Medication  . 0.9 %  sodium chloride infusion     No Known Allergies  Social History   Socioeconomic History  . Marital status: Single    Spouse name: Not on file  . Number of children: 4  . Years of education: Not on file  . Highest education level: Not on file  Occupational History  . 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    Employer: [employer]  Social Needs  . Financial resource strain: Not on file  . Food insecurity:    Worry: Not on file    Inability: Not on file  . Transportation needs:    Medical: Not on file    Non-medical: Not on file  Tobacco Use  . Smoking status: Current Every Day Smoker    Packs/day: 0.10    Years: 51.00    Pack years: 5.10    Types: Cigarettes  . Smokeless tobacco: Never Used  . Tobacco comment: down to 3 cigarettes daily  Substance and Sexual Activity  . Alcohol use: No    Alcohol/week: 0.0 standard drinks  . Drug use: No  . Sexual activity: Yes  Lifestyle  . Physical activity:    Days per week: Not on file    Minutes per session: Not on file  . Stress: Not on file  Relationships  . Social connections:    Talks on phone: Not on file    Gets together: Not on file    Attends religious service: Not on file    Active member of club or organization: Not on file    Attends meetings of clubs or organizations: Not on file    Relationship status: Not on file  . Intimate partner violence:    Fear of current or ex partner: Not on file    Emotionally abused: Not on file    Physically abused: Not on file    Forced sexual activity: Not on file  Other Topics Concern  . Not on file  Social History Narrative   Lives at home with grandson.      Review of Systems: General: negative for chills, fever, night sweats or weight changes.  Cardiovascular: negative for chest pain, dyspnea on  exertion, edema, orthopnea, palpitations, paroxysmal nocturnal dyspnea or shortness of breath Dermatological: negative for rash Respiratory: negative for cough or wheezing Urologic: negative for hematuria Abdominal: negative for nausea, vomiting, diarrhea, bright red blood per rectum, melena, or hematemesis Neurologic: negative for visual changes, syncope, or dizziness All other systems reviewed and are otherwise negative except as noted above.    Blood pressure (!) 128/52, pulse 78, height 5' 1.5" (1.562 m), weight 134 lb (60.8 kg).  General appearance: alert and no distress Neck: no adenopathy, no carotid bruit, no JVD, supple, symmetrical, trachea midline and thyroid not enlarged, symmetric, no tenderness/mass/nodules Lungs: clear to auscultation bilaterally Heart: regular rate and rhythm,  S1, S2 normal, no murmur, click, rub or gallop Extremities: extremities normal, atraumatic, no cyanosis or edema Pulses: Absent pedal pulses bilaterally Skin: Skin color, texture, turgor normal. No rashes or lesions Neurologic: Alert and oriented X 3, normal strength and tone. Normal symmetric reflexes. Normal coordination and gait  EKG sinus rhythm at 78 without ST or T wave changes.  I personally reviewed this EKG.  ASSESSMENT AND PLAN:   Peripheral arterial disease History of PAD status post remote right renal artery stenting and right external iliac artery stenting.  I re-angiogram to her 11/22/2014 revealing an occluded right renal artery stent high-grade ostial right common iliac artery stenosis as well as right external iliac artery "in-stent restenosis".  I stented both of her areas in her right iliac system and got an excellent result.  She did have an occluded left SFA with high-grade segmental use right SFA stenosis.  Her Dopplers have remained stable since suggesting patent iliac stents and she denies claudication.  Bilateral carotid artery disease ([place]) Carotid Dopplers performed  08/06/2017 showed moderate left ICA stenosis.  This will be repeated on an annual basis by duplex ultrasound  Renal artery stenosis, native, bilateral History of remote right renal artery stenting with angiogram performed 11/22/2014 demonstrating her right renal artery stent to be totally occluded.      Lorretta Harp MD FACP,FACC,FAHA, [facility] 08/29/2018 8:14 AM

## 2018-08-29 NOTE — Assessment & Plan Note (Signed)
Carotid Dopplers performed 08/06/2017 showed moderate left ICA stenosis.  This will be repeated on an annual basis by duplex ultrasound

## 2018-09-04 ENCOUNTER — Other Ambulatory Visit: Payer: Self-pay | Admitting: *Deleted

## 2018-09-04 DIAGNOSIS — I739 Peripheral vascular disease, unspecified: Secondary | ICD-10-CM

## 2018-09-08 ENCOUNTER — Encounter: Payer: Self-pay | Admitting: Family Medicine

## 2018-09-08 ENCOUNTER — Other Ambulatory Visit: Payer: Self-pay | Admitting: Family Medicine

## 2018-09-08 ENCOUNTER — Telehealth: Payer: Self-pay | Admitting: Family Medicine

## 2018-09-08 DIAGNOSIS — M79672 Pain in left foot: Secondary | ICD-10-CM | POA: Diagnosis not present

## 2018-09-08 DIAGNOSIS — L84 Corns and callosities: Secondary | ICD-10-CM | POA: Diagnosis not present

## 2018-09-08 DIAGNOSIS — B351 Tinea unguium: Secondary | ICD-10-CM | POA: Diagnosis not present

## 2018-09-08 DIAGNOSIS — E1159 Type 2 diabetes mellitus with other circulatory complications: Secondary | ICD-10-CM | POA: Diagnosis not present

## 2018-09-08 DIAGNOSIS — M79671 Pain in right foot: Secondary | ICD-10-CM | POA: Diagnosis not present

## 2018-09-08 NOTE — Telephone Encounter (Signed)
Printed juror duty excuse letter. Will place on desk of [place].

## 2018-09-08 NOTE — Telephone Encounter (Signed)
Ok to do letter

## 2018-09-08 NOTE — Telephone Encounter (Signed)
Letter done and patients daughter informed

## 2018-09-08 NOTE — Telephone Encounter (Signed)
Write (ok to write the patient an excuse for jury duty) her a excuse note for jury duty. Its schedule for Feb 02/2019. Juror # is [phone] Above message from the patients daughter. Let me know and can let the daughter know

## 2018-09-23 ENCOUNTER — Telehealth: Payer: Self-pay | Admitting: *Deleted

## 2018-09-23 NOTE — Telephone Encounter (Signed)
   [facility] Pre-operative Risk Assessment    Request for surgical clearance:  1. What type of surgery is being performed? MULTIPLE TEETH EXTRACTED   2. When is this surgery scheduled? TBD   3. What type of clearance is required (medical clearance vs. Pharmacy clearance to hold med vs. Both)? BOTH  4. Are there any medications that need to be held prior to surgery and how long?ASA/PLAVIX AND HOW LONG    5. Practice name and name of physician performing surgery? [facility]  DR Gilpin  6. What is your office phone number? [phone]    7.   What is your office fax number? [phone]   8.   Anesthesia type (None, local, MAC, general) ? LOCAL    Alvina Filbert 09/23/2018, 5:16 PM  _________________________________________________________________   (provider comments below)

## 2018-09-30 NOTE — Telephone Encounter (Signed)
Dr Gwenlyn Found this pt has extensive vascular disease but has been stable, is it OK to hold ASA and Plavix 5 days pre op ?  Please reply to CV DIV PRE OP  Clary Meeker PA-C 09/30/2018 2:58 PM

## 2018-09-30 NOTE — Telephone Encounter (Signed)
Absolutely okay to hold aspirin and Plavix

## 2018-10-01 NOTE — Telephone Encounter (Signed)
   Primary Cardiologist: Quay Burow, MD  Chart reviewed as part of pre-operative protocol coverage. Given past medical history and time since last visit, based on ACC/AHA guidelines, Joei Person would be at acceptable risk for the planned procedure without further cardiovascular testing.   OK to hold Plavix 5 days pre op and aspirin 3 days pre op if needed.  I will route this recommendation to the requesting party via Epic fax function and remove from pre-op pool.  Please call with questions.  Kerin Ransom, PA-C 10/01/2018, 10:07 AM

## 2018-10-03 ENCOUNTER — Other Ambulatory Visit: Payer: Self-pay | Admitting: Family Medicine

## 2018-10-13 ENCOUNTER — Other Ambulatory Visit: Payer: Self-pay | Admitting: Family Medicine

## 2018-10-26 ENCOUNTER — Other Ambulatory Visit: Payer: Self-pay | Admitting: Cardiovascular Disease

## 2018-10-27 NOTE — Telephone Encounter (Signed)
Rx(s) sent to pharmacy electronically.  

## 2018-11-07 ENCOUNTER — Other Ambulatory Visit: Payer: Self-pay

## 2018-11-07 MED ORDER — OLMESARTAN MEDOXOMIL 20 MG PO TABS: ORAL_TABLET | ORAL | 0 refills | Status: DC

## 2018-12-08 DIAGNOSIS — L84 Corns and callosities: Secondary | ICD-10-CM | POA: Diagnosis not present

## 2018-12-08 DIAGNOSIS — M79672 Pain in left foot: Secondary | ICD-10-CM | POA: Diagnosis not present

## 2018-12-08 DIAGNOSIS — M79671 Pain in right foot: Secondary | ICD-10-CM | POA: Diagnosis not present

## 2018-12-08 DIAGNOSIS — E1159 Type 2 diabetes mellitus with other circulatory complications: Secondary | ICD-10-CM | POA: Diagnosis not present

## 2018-12-08 DIAGNOSIS — B351 Tinea unguium: Secondary | ICD-10-CM | POA: Diagnosis not present

## 2019-01-15 ENCOUNTER — Other Ambulatory Visit: Payer: Self-pay | Admitting: Family Medicine

## 2019-01-15 DIAGNOSIS — E785 Hyperlipidemia, unspecified: Secondary | ICD-10-CM

## 2019-01-19 ENCOUNTER — Encounter: Payer: Self-pay | Admitting: Family Medicine

## 2019-01-19 ENCOUNTER — Other Ambulatory Visit: Payer: Self-pay

## 2019-01-19 ENCOUNTER — Ambulatory Visit (INDEPENDENT_AMBULATORY_CARE_PROVIDER_SITE_OTHER): Payer: Medicare Other | Admitting: Family Medicine

## 2019-01-19 DIAGNOSIS — E1169 Type 2 diabetes mellitus with other specified complication: Secondary | ICD-10-CM | POA: Diagnosis not present

## 2019-01-19 DIAGNOSIS — I10 Essential (primary) hypertension: Secondary | ICD-10-CM | POA: Diagnosis not present

## 2019-01-19 DIAGNOSIS — E1165 Type 2 diabetes mellitus with hyperglycemia: Secondary | ICD-10-CM

## 2019-01-19 DIAGNOSIS — E785 Hyperlipidemia, unspecified: Secondary | ICD-10-CM | POA: Diagnosis not present

## 2019-01-19 NOTE — Progress Notes (Signed)
Virtual Visit via Telephone Note  I connected with Christine Meyer on 01/19/19 at  2:00 PM EDT by telephone and verified that I am speaking with the correct person using two identifiers.  Location: Patient: home Provider: home    I discussed the limitations, risks, security and privacy concerns of performing an evaluation and management service by telephone and the availability of in person appointments. I also discussed with the patient that there may be a patient responsible charge related to this service. The patient expressed understanding and agreed to proceed.   History of Present Illness:  HPI HYPERTENSION   Blood pressure range-125/ 40    Chest pain- no      Dyspnea- no Lightheadedness- no   Edema- no  Other side effects - no   Medication compliance: good Low salt diet- yes    DIABETES    Blood Sugar ranges- 100  Polyuria- no New Visual problems- no  Hypoglycemic symptoms- no  Other side effects-no Medication compliance - good Last eye exam- due     HYPERLIPIDEMIA  Medication compliance- good  RUQ pain- no  Muscle aches- no Other side effects-no   Review of Systems  Constitutional: Negative for activity change, appetite change and fatigue.  HENT: Negative for hearing loss, congestion, tinnitus and ear discharge.  dentist q49mEyes: Negative for visual disturbance (see optho q1y -- vision corrected to 20/20 with glasses).  Respiratory: Negative for cough, chest tightness and shortness of breath.   Cardiovascular: Negative for chest pain, palpitations and leg swelling.  Gastrointestinal: Negative for abdominal pain, diarrhea, constipation and abdominal distention.  Genitourinary: Negative for urgency, frequency, decreased urine volume and difficulty urinating.  Musculoskeletal: Negative for back pain, arthralgias and gait problem.  Skin: Negative for color change, pallor and rash.  Neurological: Negative for dizziness, light-headedness, numbness and headaches.   Hematological: Negative for adenopathy. Does not bruise/bleed easily.  Psychiatric/Behavioral: Negative for suicidal ideas, confusion, sleep disturbance, self-injury, dysphoric mood, decreased concentration and agitation.     Current Outpatient Medications on File Prior to Visit  Medication Sig Dispense Refill  . ACCU-CHEK SOFTCLIX LANCETS lancets Check blood sugar once daily. Dx:E11.9 100 each 5  . alendronate (FOSAMAX) 70 MG tablet TAKE 1 TABLET(70 MG) BY MOUTH 1 TIME A WEEK WITH A FULL GLASS OF WATER AND ON AN EMPTY STOMACH 12 tablet 3  . aspirin EC 81 MG tablet Take 1 tablet (81 mg total) by mouth daily. 90 tablet 0  . atorvastatin (LIPITOR) 40 MG tablet TAKE 1 TABLET BY MOUTH  DAILY 90 tablet 0  . Blood Glucose Monitoring Suppl (ACCU-CHEK AVIVA PLUS) W/DEVICE KIT Check Blood sugar daily. Dx E11.9 1 kit 0  . budesonide-formoterol (SYMBICORT) 160-4.5 MCG/ACT inhaler Inhale 2 puffs into the lungs 2 (two) times daily. 1 Inhaler 0  . Calcium 500-100 MG-UNIT CHEW CHEW AND SWALLOW ONE TABLET DAILY WITH BREAKFAST 150 tablet 0  . calcium carbonate (OS-CAL - DOSED IN MG OF ELEMENTAL CALCIUM) 1250 (500 Ca) MG tablet Take 1 tablet (500 mg of elemental calcium total) by mouth daily with breakfast. 90 tablet 3  . Cholecalciferol (VITAMIN D3) 2000 units capsule Take 1 capsule (2,000 Units total) by mouth daily. 60 capsule 11  . clopidogrel (PLAVIX) 75 MG tablet Take 1 tablet (75 mg total) by mouth daily. 90 tablet 3  . docusate sodium (COLACE) 100 MG capsule Take 1 capsule (100 mg total) by mouth every 12 (twelve) hours. 30 capsule 0  . gabapentin (NEURONTIN) 100 MG capsule Take 2  capsules (200 mg total) by mouth at bedtime. 60 capsule 3  . meloxicam (MOBIC) 7.5 MG tablet TAKE 1 TABLET(7.5 MG) BY MOUTH AS NEEDED 30 tablet 0  . metoprolol succinate (TOPROL-XL) 25 MG 24 hr tablet TAKE 1 TABLET BY MOUTH DAILY 90 tablet 1  . Multiple Vitamins-Minerals (A THRU Z SELECT 50+ ADVANCED) TABS TAKE 1 TABLET BY  MOUTH DAILY 220 tablet 1  . olmesartan (BENICAR) 20 MG tablet TAKE 1 TABLET(20 MG) BY MOUTH DAILY 90 tablet 0  . ondansetron (ZOFRAN ODT) 4 MG disintegrating tablet 6m ODT q6 hours prn nausea/vomit 6 tablet 0  . ONETOUCH VERIO test strip USE TO TEST BLOOD SUGAR ONCE DAILY 75 each 0  . pantoprazole (PROTONIX) 20 MG tablet Take 1 tablet (20 mg total) by mouth daily before breakfast. 90 tablet 3  . Pirfenidone (ESBRIET) 267 MG CAPS TAKE 2 CAPSULES BY MOUTH THREE TIMES A DAY (Patient taking differently: TAKE 1 CAPSULE BY MOUTH THREE TIMES A DAY) 180 capsule 11  . spironolactone (ALDACTONE) 25 MG tablet TAKE 1 TABLET(25 MG) BY MOUTH DAILY 90 tablet 1  . SYMBICORT 160-4.5 MCG/ACT inhaler INHALE 2 PUFFS INTO THE LUNGS TWICE DAILY 10.2 g 5  . SYRINGE-NEEDLE, DISP, 3 ML (BD INTEGRA SYRINGE) 25G X 1" 3 ML MISC USE TO INJECT B12 ONCE A MONTH 12 each 1  . traMADol (ULTRAM) 50 MG tablet Take 1 tablet (50 mg total) by mouth every 6 (six) hours as needed. 15 tablet 0   Current Facility-Administered Medications on File Prior to Visit  Medication Dose Route Frequency Provider Last Rate Last Dose  . 0.9 %  sodium chloride infusion  500 mL Intravenous Once GGatha Mayer MD       Past Medical History:  Diagnosis Date  . Anemia    NOS  . CAD (coronary artery disease)    Stent RCA 1999  . COPD (chronic obstructive pulmonary disease) ([place]   . Diabetes mellitus, type 2 (HCC)    Diet controlled  . Gastric polyps - hyperplastic 02/23/2018  . Hyperlipidemia   . Hypertension   . OSA (obstructive sleep apnea) [name]   . PVD (peripheral vascular disease) (HCC)    Carotid stenosis, renal artery stenosis  . Sleep apnea    dx'd but doesn't use mask   . Uterine cancer ([place] ?1980's       Observations/Objective: 125/40   bs 100    Pt is in NAD, rr normal    Assessment and Plan: .1. Uncontrolled type 2 diabetes mellitus with hyperglycemia ([place] Check labs hgba1c to be checked , minimize simple  carbs. Increase exercise as tolerated. Continue current meds  - Lipid panel; Future - Hemoglobin A1c; Future - Comprehensive metabolic panel; Future - Microalbumin / creatinine urine ratio; Future  2. Hyperlipidemia associated with type 2 diabetes mellitus ([place] Tolerating statin, encouraged heart healthy diet, avoid trans fats, minimize simple carbs and saturated fats. Increase exercise as tolerated - Lipid panel; Future - Hemoglobin A1c; Future - Comprehensive metabolic panel; Future - Microalbumin / creatinine urine ratio; Future  3. Essential hypertension Well controlled, no changes to meds. Encouraged heart healthy diet such as the DASH diet and exercise as tolerated.  - Lipid panel; Future - Hemoglobin A1c; Future - Comprehensive metabolic panel; Future - Microalbumin / creatinine urine ratio; Future   Follow Up Instructions:    I discussed the assessment and treatment plan with the patient. The patient was provided an opportunity to ask questions and all  were answered. The patient agreed with the plan and demonstrated an understanding of the instructions.   The patient was advised to call back or seek an in-person evaluation if the symptoms worsen or if the condition fails to improve as anticipated.  I provided 20 minutes of non-face-to-face time during this encounter.   Ann Held, DO

## 2019-01-20 ENCOUNTER — Telehealth: Payer: Self-pay | Admitting: Family Medicine

## 2019-01-20 NOTE — Telephone Encounter (Signed)
Pt had VOV yesterday and forgot to ask for refill on cyanocobalamin (,VITAMIN B-12,) 1000 MCG/ML injection ok to send to [id number] - [place], [place] - [address] AT [place]. Please advise.

## 2019-01-21 ENCOUNTER — Other Ambulatory Visit: Payer: Self-pay | Admitting: Family Medicine

## 2019-01-21 DIAGNOSIS — E538 Deficiency of other specified B group vitamins: Secondary | ICD-10-CM

## 2019-01-21 MED ORDER — CYANOCOBALAMIN 1000 MCG/ML IJ SOLN: 1000.0000 ug | Freq: Once | INTRAMUSCULAR | 11 refills | Status: AC

## 2019-01-21 NOTE — Telephone Encounter (Signed)
Pt had virtual vist with you on 01/19/2019 and forgot to ask for refill of B12 injections. Last refill was on 03/07/2018 with 11 refills. Can I reorder? Last B12 lab was 06/26/2018. Please advise?

## 2019-01-21 NOTE — Telephone Encounter (Signed)
sent 

## 2019-01-24 ENCOUNTER — Other Ambulatory Visit: Payer: Self-pay | Admitting: Family Medicine

## 2019-01-26 ENCOUNTER — Other Ambulatory Visit: Payer: Self-pay

## 2019-01-26 ENCOUNTER — Other Ambulatory Visit (INDEPENDENT_AMBULATORY_CARE_PROVIDER_SITE_OTHER): Payer: Medicare Other

## 2019-01-26 DIAGNOSIS — I10 Essential (primary) hypertension: Secondary | ICD-10-CM | POA: Diagnosis not present

## 2019-01-26 DIAGNOSIS — E785 Hyperlipidemia, unspecified: Secondary | ICD-10-CM | POA: Diagnosis not present

## 2019-01-26 DIAGNOSIS — E1169 Type 2 diabetes mellitus with other specified complication: Secondary | ICD-10-CM | POA: Diagnosis not present

## 2019-01-26 DIAGNOSIS — E1165 Type 2 diabetes mellitus with hyperglycemia: Secondary | ICD-10-CM | POA: Diagnosis not present

## 2019-01-26 LAB — LIPID PANEL
Cholesterol: 156 mg/dL (ref 0–200)
HDL: 59.5 mg/dL (ref >39.00)
LDL Cholesterol: 78 mg/dL (ref 0–99)
NonHDL: 96.12
Total CHOL/HDL Ratio: 3
Triglycerides: 89 mg/dL (ref 0.0–149.0)
VLDL: 17.8 mg/dL (ref 0.0–40.0)

## 2019-01-26 LAB — COMPREHENSIVE METABOLIC PANEL
ALT: 21 U/L (ref 0–35)
AST: 20 U/L (ref 0–37)
Albumin: 4.1 g/dL (ref 3.5–5.2)
Alkaline Phosphatase: 56 U/L (ref 39–117)
BUN: 14 mg/dL (ref 6–23)
CO2: 25 mEq/L (ref 19–32)
Calcium: 9.1 mg/dL (ref 8.4–10.5)
Chloride: 102 mEq/L (ref 96–112)
Creatinine, Ser: 1.08 mg/dL (ref 0.40–1.20)
GFR: 60.71 mL/min (ref >60.00)
Glucose, Bld: 111 mg/dL — ABNORMAL HIGH (ref 70–99)
Potassium: 3.4 mEq/L — ABNORMAL LOW (ref 3.5–5.1)
Sodium: 138 mEq/L (ref 135–145)
Total Bilirubin: 0.4 mg/dL (ref 0.2–1.2)
Total Protein: 7 g/dL (ref 6.0–8.3)

## 2019-01-26 LAB — MICROALBUMIN / CREATININE URINE RATIO
Creatinine,U: 57.1 mg/dL
Microalb Creat Ratio: 1.2 mg/g (ref 0.0–30.0)
Microalb, Ur: <0.7 mg/dL (ref 0.0–1.9)

## 2019-01-26 LAB — HEMOGLOBIN A1C: Hgb A1c MFr Bld: 6.3 % (ref 4.6–6.5)

## 2019-01-27 ENCOUNTER — Other Ambulatory Visit: Payer: Self-pay | Admitting: Family Medicine

## 2019-01-27 MED ORDER — ONETOUCH VERIO W/DEVICE KIT: PACK | 0 refills | Status: AC

## 2019-02-08 ENCOUNTER — Other Ambulatory Visit: Payer: Self-pay | Admitting: Family Medicine

## 2019-02-08 DIAGNOSIS — M8580 Other specified disorders of bone density and structure, unspecified site: Secondary | ICD-10-CM

## 2019-02-13 ENCOUNTER — Ambulatory Visit (INDEPENDENT_AMBULATORY_CARE_PROVIDER_SITE_OTHER): Payer: Medicare Other | Admitting: Internal Medicine

## 2019-02-13 ENCOUNTER — Other Ambulatory Visit: Payer: Self-pay

## 2019-02-13 ENCOUNTER — Encounter: Payer: Self-pay | Admitting: Internal Medicine

## 2019-02-13 DIAGNOSIS — K317 Polyp of stomach and duodenum: Secondary | ICD-10-CM | POA: Diagnosis not present

## 2019-02-13 DIAGNOSIS — Z7902 Long term (current) use of antithrombotics/antiplatelets: Secondary | ICD-10-CM | POA: Diagnosis not present

## 2019-02-13 NOTE — Patient Instructions (Addendum)
It was nice to talk to you today.  As I explained, I want to check up on the polyps in your stomach to make sure that they are not growing and turning in the something that could cause trouble.  That is very unlikely but I think it makes sense to know.   I will have you stop your clopidogrel, also known as Plavix, 5 days prior to the procedure.  We will make sure to check with Dr. Percival Spanish that he thinks that is okay.   We will check with [name] to figure out the best day and time to do this.  Instructions have been given to Lucy to pass on to Mom.  I appreciate the opportunity to care for you. Gatha Mayer, MD, [facility]

## 2019-02-13 NOTE — Progress Notes (Signed)
TELEHEALTH ENCOUNTER IN SETTING OF COVID-19 PANDEMIC - REQUESTED BY PATIENT SERVICE PROVIDED BY TELEMEDECINE - TYPE: Phone PATIENT LOCATION: Home PATIENT HAS CONSENTED TO TELEHEALTH VISIT PROVIDER LOCATION: OFFICE REFERRING PROVIDER:N/A PARTICIPANTS OTHER THAN PATIENT:NoneTIME SPENT ON CALL:11 mins   Christine Meyer 68 y.o. 1951/02/07 [id number]  Assessment & Plan:  Gastric polyps - hyperplastic Plan for surveillance EGD - bx f/u vs polypectomy Hold clopidogrel x 5 d before The risks and benefits as well as alternatives of endoscopic procedure(s) have been discussed and reviewed. All questions answered. The patient agrees to proceed. Extra rare risk vascular event discussed  Antiplatelet or antithrombotic long-term use - clopidogrel - CAD/PAD Hold clopidogrel 5 d pre procedure Clarify w/ Dr. Percival Spanish      Subjective:   Chief Complaint: History of gastric polyps, on clopidogrel  HPI Christine Meyer is a delightful 68 year old woman with a history of hyperplastic gastric polyps found on EGD last year, in June.  I had recommended a routine repeat exam to make sure these were not growing and becoming more of a problem.  She denies any active GI complaints at this time.  She had been having some problem with leg cramps which is a bit better, her potassium was slightly low so she is increasing potassium rich foods.  She also has peripheral and coronary artery disease though the cramps were at bedtime and not with walking so not thought to be claudication, from what I can tell. No Known Allergies Current Meds  Medication Sig  . ACCU-CHEK SOFTCLIX LANCETS lancets Check blood sugar once daily. Dx:E11.9  . alendronate (FOSAMAX) 70 MG tablet TAKE 1 TABLET(70 MG) BY MOUTH 1 TIME A WEEK WITH A FULL GLASS OF WATER AND ON AN EMPTY STOMACH  . aspirin EC 81 MG tablet Take 1 tablet (81 mg total) by mouth daily.  Marland Kitchen atorvastatin (LIPITOR) 40 MG tablet TAKE 1 TABLET BY MOUTH  DAILY  . Blood Glucose  Monitoring Suppl (ONETOUCH VERIO) w/Device KIT Use daily to check blood sugar.   DX E11.9  . budesonide-formoterol (SYMBICORT) 160-4.5 MCG/ACT inhaler Inhale 2 puffs into the lungs 2 (two) times daily.  . Calcium 500-100 MG-UNIT CHEW CHEW AND SWALLOW ONE TABLET DAILY WITH BREAKFAST  . Cholecalciferol (VITAMIN D3) 50 MCG (2000 UT) capsule TAKE 1 CAPSULE BY MOUTH DAILY  . clopidogrel (PLAVIX) 75 MG tablet Take 1 tablet (75 mg total) by mouth daily.  Marland Kitchen gabapentin (NEURONTIN) 100 MG capsule Take 2 capsules (200 mg total) by mouth at bedtime. (Patient taking differently: Take 200 mg by mouth at bedtime. Uses prn)  . meloxicam (MOBIC) 7.5 MG tablet TAKE 1 TABLET(7.5 MG) BY MOUTH AS NEEDED  . metoprolol succinate (TOPROL-XL) 25 MG 24 hr tablet TAKE 1 TABLET BY MOUTH DAILY  . Multiple Vitamins-Minerals (A THRU Z SELECT 50+ ADVANCED) TABS TAKE 1 TABLET BY MOUTH DAILY  . olmesartan (BENICAR) 20 MG tablet TAKE 1 TABLET(20 MG) BY MOUTH DAILY  . ondansetron (ZOFRAN ODT) 4 MG disintegrating tablet 55m ODT q6 hours prn nausea/vomit  . ONETOUCH VERIO test strip USE TO TEST BLOOD SUGAR ONCE DAILY  . pantoprazole (PROTONIX) 20 MG tablet Take 1 tablet (20 mg total) by mouth daily before breakfast.  . Pirfenidone (ESBRIET) 267 MG CAPS TAKE 2 CAPSULES BY MOUTH THREE TIMES A DAY (Patient taking differently: TAKE 1 CAPSULE BY MOUTH THREE TIMES A DAY)  . spironolactone (ALDACTONE) 25 MG tablet TAKE 1 TABLET(25 MG) BY MOUTH DAILY  . SYMBICORT 160-4.5 MCG/ACT inhaler INHALE 2  PUFFS INTO THE LUNGS TWICE DAILY  . SYRINGE-NEEDLE, DISP, 3 ML (BD INTEGRA SYRINGE) 25G X 1" 3 ML MISC USE TO INJECT B12 ONCE A MONTH  . traMADol (ULTRAM) 50 MG tablet Take 1 tablet (50 mg total) by mouth every 6 (six) hours as needed.   Past Medical History:  Diagnosis Date  . Anemia    NOS  . CAD (coronary artery disease)    Stent RCA 1999  . COPD (chronic obstructive pulmonary disease) ([place])   . Diabetes mellitus, type 2 (HCC)    Diet  controlled  . Gastric polyps - hyperplastic 02/23/2018  . Hyperlipidemia   . Hypertension   . OSA (obstructive sleep apnea) [name]   . PVD (peripheral vascular disease) ([facility])    Carotid stenosis, renal artery stenosis  . Sleep apnea    dx'd but doesn't use mask   . Uterine cancer ([place]) ?9643'C   Past Surgical History:  Procedure Laterality Date  . ABDOMINAL ANGIOGRAM  11/22/2014   Procedure: ABDOMINAL ANGIOGRAM;  Surgeon: Lorretta Harp, MD;  Location: [facility] CATH LAB;  Service: Cardiovascular;;  . COLONOSCOPY  2000   negative  . CORONARY ANGIOPLASTY WITH STENT PLACEMENT  1999   RCA; Dr. Melvern Banker G4 P4  . ILIAC ARTERY STENT Right 11/22/2014  . LOWER EXTREMITY ANGIOGRAM N/A 11/22/2014   Procedure: LOWER EXTREMITY ANGIOGRAM;  Surgeon: Lorretta Harp, MD; L-SFA 100%, 3v runoff, R-pCIA 95>>0% w/ 8 mm x 18 mm long Balloon expandable stent; R-dCIA 67>>0% w/ 8 mm x 4 cm long Cordis Smart Nitinol self expanding stent   . RENAL ARTERY STENT Right May 2007; 08/2006   ; restenosis  . TOTAL ABDOMINAL HYSTERECTOMY  ?1980's    for uterine CA   Social History   Social History Narrative   Lives at home with grandson.    Daughter Christine Meyer is CMA Financial controller primary care [employer]   Retired Risk analyst   Smoker, no EtOH/drugs   family history includes Breast cancer in her daughter; Deep vein thrombosis in her mother; Diabetes in her brother, maternal aunt, maternal grandmother, mother, and sister; Emphysema in her brother; Heart attack in her paternal aunt; Heart failure in her mother; Hypertension in her brother, father, and mother.   Review of Systems Leg cramps

## 2019-02-13 NOTE — Assessment & Plan Note (Signed)
Hold clopidogrel 5 d pre procedure Clarify w/ Dr. Percival Spanish

## 2019-02-13 NOTE — Assessment & Plan Note (Signed)
Plan for surveillance EGD - bx f/u vs polypectomy Hold clopidogrel x 5 d before The risks and benefits as well as alternatives of endoscopic procedure(s) have been discussed and reviewed. All questions answered. The patient agrees to proceed. Extra rare risk vascular event discussed

## 2019-02-16 ENCOUNTER — Telehealth: Payer: Self-pay

## 2019-02-16 NOTE — Telephone Encounter (Signed)
   Primary Cardiologist: Quay Burow, MD  Chart reviewed as part of pre-operative protocol coverage. Patient was contacted 02/16/2019 in reference to pre-operative risk assessment for pending surgery as outlined below.  [name] was last seen on 08/29/18 by Dr. Gwenlyn Found.  Since that day, Ingrid Wiltsey has done well w/o cardiac symptoms. No anginal symptomatology.   Therefore, based on ACC/AHA guidelines, the patient would be at acceptable risk for the planned procedure without further cardiovascular testing.   Dr. Gwenlyn Found has also agreed that pt is ok for EGD and can hold Plavix 5 days prior to procedure.   I will route this recommendation to the requesting party via Epic fax function and remove from pre-op pool.  Please call with questions.  Lyda Jester, PA-C 02/16/2019, 9:57 AM

## 2019-02-16 NOTE — Telephone Encounter (Signed)
Cleared for EGD.  Can hold antiplatelet agents.

## 2019-02-16 NOTE — Telephone Encounter (Signed)
[facility] Pre-operative Risk Assessment     Request for surgical clearance:     Endoscopy Procedure  What type of surgery is being performed?     EGD  When is this surgery scheduled?     03/09/2019  What type of clearance is required ?   Pharmacy  Are there any medications that need to be held prior to surgery and how long? Plavix, 5 days  Practice name and name of physician performing surgery?      [facility]  What is your office phone and fax number?      Phone- [phone]  [phone]  Anesthesia type (None, local, MAC, general) ?       MAC

## 2019-02-16 NOTE — Telephone Encounter (Signed)
Daughter [name] informed that Mom can hold her plavix 5 days before her procedure to be done 03/09/2019. She verbalized understanding and she will tell Mom.

## 2019-03-02 ENCOUNTER — Other Ambulatory Visit: Payer: Self-pay | Admitting: Pulmonary Disease

## 2019-03-06 ENCOUNTER — Telehealth: Payer: Self-pay | Admitting: Internal Medicine

## 2019-03-06 NOTE — Telephone Encounter (Signed)

## 2019-03-09 ENCOUNTER — Ambulatory Visit (AMBULATORY_SURGERY_CENTER): Payer: Medicare Other | Admitting: Internal Medicine

## 2019-03-09 ENCOUNTER — Encounter: Payer: Self-pay | Admitting: Internal Medicine

## 2019-03-09 ENCOUNTER — Other Ambulatory Visit: Payer: Self-pay

## 2019-03-09 VITALS — BP 139/50 | HR 62 | Temp 98.7°F | Resp 18 | Ht 62.0 in | Wt 140.0 lb

## 2019-03-09 DIAGNOSIS — K3189 Other diseases of stomach and duodenum: Secondary | ICD-10-CM

## 2019-03-09 DIAGNOSIS — K317 Polyp of stomach and duodenum: Secondary | ICD-10-CM

## 2019-03-09 DIAGNOSIS — K2950 Unspecified chronic gastritis without bleeding: Secondary | ICD-10-CM | POA: Diagnosis not present

## 2019-03-09 DIAGNOSIS — K2970 Gastritis, unspecified, without bleeding: Secondary | ICD-10-CM | POA: Diagnosis not present

## 2019-03-09 MED ORDER — SODIUM CHLORIDE 0.9 % IV SOLN: 500.0000 mL | Freq: Once | INTRAVENOUS | Status: DC

## 2019-03-09 NOTE — Op Note (Signed)
[facility] Patient Name: Christine Meyer Procedure Date: 03/09/2019 9:19 AM MRN: [id number] Endoscopist: Gatha Mayer , MD Age: 68 Referring MD:  Date of Birth: 1951/01/05 Gender: Female Account #: [id number] Procedure:                Upper GI endoscopy Indications:              Follow-up of gastric polyps Medicines:                Propofol per Anesthesia, Monitored Anesthesia Care Procedure:                Pre-Anesthesia Assessment:                           - Prior to the procedure, a History and Physical                            was performed, and patient medications and                            allergies were reviewed. The patient's tolerance of                            previous anesthesia was also reviewed. The risks                            and benefits of the procedure and the sedation                            options and risks were discussed with the patient.                            All questions were answered, and informed consent                            was obtained. Prior Anticoagulants: The patient                            last took Plavix (clopidogrel) 5 days prior to the                            procedure. ASA Grade Assessment: III - A patient                            with severe systemic disease. After reviewing the                            risks and benefits, the patient was deemed in                            satisfactory condition to undergo the procedure.                           After obtaining informed consent, the endoscope was  passed under direct vision. Throughout the                            procedure, the patient's blood pressure, pulse, and                            oxygen saturations were monitored continuously. The                            Endoscope was introduced through the mouth, and                            advanced to the second part of duodenum. The upper      GI endoscopy was accomplished without difficulty.                            The patient tolerated the procedure well. Scope In: Scope Out: Findings:                 Multiple diminutive sessile polyps were found in                            the gastric fundus and in the gastric body.                            Biopsies were taken with a cold forceps for                            histology. Verification of patient identification                            for the specimen was done. Estimated blood loss was                            minimal.                           The exam was otherwise without abnormality.                           The cardia and gastric fundus were normal on                            retroflexion. Complications:            No immediate complications. Estimated Blood Loss:     Estimated blood loss was minimal. Impression:               - Multiple gastric polyps. Biopsied. Hyperplastic                            last year. They remain diminutive.                           - The examination was otherwise normal. Recommendation:           - Patient  has a contact number available for                            emergencies. The signs and symptoms of potential                            delayed complications were discussed with the                            patient. Return to normal activities tomorrow.                            Written discharge instructions were provided to the                            patient.                           - Resume previous diet.                           - Continue present medications.                           - Await pathology results.                           - Resume Plavix (clopidogrel) at prior dose                            tomorrow. Gatha Mayer, MD 03/09/2019 9:41:32 AM This report has been signed electronically.

## 2019-03-09 NOTE — Patient Instructions (Addendum)
The tiny polyps are still there. I took biopsy samples again. I am not thinking these are dangerous. Still very tiny and very unlikely that they would ever grow into a problem based upon what I know. I will let you know results and recommendations.  Restart Plavix (clopidogrel) tomorrow.  I appreciate the opportunity to care for you. Gatha Mayer, MD, FACG   YOU HAD AN ENDOSCOPIC PROCEDURE TODAY AT [facility]:   Refer to the procedure report that was given to you for any specific questions about what was found during the examination.  If the procedure report does not answer your questions, please call your gastroenterologist to clarify.  If you requested that your care partner not be given the details of your procedure findings, then the procedure report has been included in a sealed envelope for you to review at your convenience later.  YOU SHOULD EXPECT: Some feelings of bloating in the abdomen. Passage of more gas than usual.  Walking can help get rid of the air that was put into your GI tract during the procedure and reduce the bloating. If you had a lower endoscopy (such as a colonoscopy or flexible sigmoidoscopy) you may notice spotting of blood in your stool or on the toilet paper. If you underwent a bowel prep for your procedure, you may not have a normal bowel movement for a few days.  Please Note:  You might notice some irritation and congestion in your nose or some drainage.  This is from the oxygen used during your procedure.  There is no need for concern and it should clear up in a day or so.  SYMPTOMS TO REPORT IMMEDIATELY:     Following upper endoscopy (EGD)  Vomiting of blood or coffee ground material  New chest pain or pain under the shoulder blades  Painful or persistently difficult swallowing  New shortness of breath  Fever of 100F or higher  Black, tarry-looking stools  For urgent or emergent issues, a gastroenterologist can be reached at  any hour by calling [phone].   DIET:  We do recommend a small meal at first, but then you may proceed to your regular diet.  Drink plenty of fluids but you should avoid alcoholic beverages for 24 hours.  ACTIVITY:  You should plan to take it easy for the rest of today and you should NOT DRIVE or use heavy machinery until tomorrow (because of the sedation medicines used during the test).    FOLLOW UP: Our staff will call the number listed on your records 48-72 hours following your procedure to check on you and address any questions or concerns that you may have regarding the information given to you following your procedure. If we do not reach you, we will leave a message.  We will attempt to reach you two times.  During this call, we will ask if you have developed any symptoms of COVID 19. If you develop any symptoms (ie: fever, flu-like symptoms, shortness of breath, cough etc.) before then, please call [phone].  If you test positive for Covid 19 in the 2 weeks post procedure, please call and report this information to [place].    If any biopsies were taken you will be contacted by phone or by letter within the next 1-3 weeks.  Please call us at [phone] if you have not heard about the biopsies in 3 weeks.    SIGNATURES/CONFIDENTIALITY: You and/or your care partner have signed paperwork which will  be entered into your electronic medical record.  These signatures attest to the fact that that the information above on your After Visit Summary has been reviewed and is understood.  Full responsibility of the confidentiality of this discharge information lies with you and/or your care-partner. 

## 2019-03-09 NOTE — Progress Notes (Signed)
TA temp JB vital signs

## 2019-03-09 NOTE — Progress Notes (Signed)
PT taken to PACU. Monitors in place. VSS. Report given to RN. 

## 2019-03-09 NOTE — Progress Notes (Signed)
Called to room to assist during endoscopic procedure.  Patient ID and intended procedure confirmed with present staff. Received instructions for my participation in the procedure from the performing physician.  

## 2019-03-10 ENCOUNTER — Other Ambulatory Visit: Payer: Self-pay | Admitting: Internal Medicine

## 2019-03-11 ENCOUNTER — Telehealth: Payer: Self-pay

## 2019-03-11 NOTE — Telephone Encounter (Signed)
  Follow up Call-  Call back number 03/09/2019 02/18/2018  Post procedure Call Back phone  # [phone]  Permission to leave phone message Yes Yes  Some recent data might be hidden     Patient questions:  Do you have a fever, pain , or abdominal swelling? No. Pain Score  0 *  Have you tolerated food without any problems? Yes.    Have you been able to return to your normal activities? Yes.    Do you have any questions about your discharge instructions: Diet   No. Medications  No. Follow up visit  No.  Do you have questions or concerns about your Care? No.  Actions: * If pain score is 4 or above: No action needed, pain <4.

## 2019-03-17 ENCOUNTER — Encounter: Payer: Self-pay | Admitting: Internal Medicine

## 2019-03-17 DIAGNOSIS — K2940 Chronic atrophic gastritis without bleeding: Secondary | ICD-10-CM

## 2019-03-17 HISTORY — DX: Chronic atrophic gastritis without bleeding: K29.40

## 2019-03-17 NOTE — Progress Notes (Signed)
Letter to patient These biopsies show metaplastic autoimmune gastritis Repeat EGD in 1 year due to rare increased cancer risk

## 2019-03-18 DIAGNOSIS — E1159 Type 2 diabetes mellitus with other circulatory complications: Secondary | ICD-10-CM | POA: Diagnosis not present

## 2019-03-18 DIAGNOSIS — B351 Tinea unguium: Secondary | ICD-10-CM | POA: Diagnosis not present

## 2019-03-18 DIAGNOSIS — M79671 Pain in right foot: Secondary | ICD-10-CM | POA: Diagnosis not present

## 2019-03-18 DIAGNOSIS — M79672 Pain in left foot: Secondary | ICD-10-CM | POA: Diagnosis not present

## 2019-03-18 DIAGNOSIS — L84 Corns and callosities: Secondary | ICD-10-CM | POA: Diagnosis not present

## 2019-03-19 ENCOUNTER — Other Ambulatory Visit: Payer: Self-pay | Admitting: Family Medicine

## 2019-03-23 ENCOUNTER — Other Ambulatory Visit: Payer: Self-pay | Admitting: Pulmonary Disease

## 2019-04-15 ENCOUNTER — Other Ambulatory Visit: Payer: Self-pay | Admitting: Family Medicine

## 2019-04-15 DIAGNOSIS — E785 Hyperlipidemia, unspecified: Secondary | ICD-10-CM

## 2019-04-29 ENCOUNTER — Other Ambulatory Visit: Payer: Self-pay | Admitting: Family Medicine

## 2019-04-29 DIAGNOSIS — I10 Essential (primary) hypertension: Secondary | ICD-10-CM

## 2019-04-29 MED ORDER — OLMESARTAN MEDOXOMIL 20 MG PO TABS: 20.0000 mg | ORAL_TABLET | Freq: Every day | ORAL | 1 refills | Status: DC

## 2019-05-11 ENCOUNTER — Other Ambulatory Visit: Payer: Self-pay

## 2019-05-11 MED ORDER — ESBRIET 801 MG PO TABS: 1.0000 | ORAL_TABLET | Freq: Three times a day (TID) | ORAL | 0 refills | Status: DC

## 2019-05-18 ENCOUNTER — Other Ambulatory Visit: Payer: Self-pay | Admitting: Pulmonary Disease

## 2019-05-29 ENCOUNTER — Other Ambulatory Visit: Payer: Self-pay | Admitting: *Deleted

## 2019-05-29 MED ORDER — GABAPENTIN 100 MG PO CAPS: 200.0000 mg | ORAL_CAPSULE | Freq: Every day | ORAL | 0 refills | Status: DC

## 2019-06-17 DIAGNOSIS — M79672 Pain in left foot: Secondary | ICD-10-CM | POA: Diagnosis not present

## 2019-06-17 DIAGNOSIS — E1159 Type 2 diabetes mellitus with other circulatory complications: Secondary | ICD-10-CM | POA: Diagnosis not present

## 2019-06-17 DIAGNOSIS — M79671 Pain in right foot: Secondary | ICD-10-CM | POA: Diagnosis not present

## 2019-06-17 DIAGNOSIS — B351 Tinea unguium: Secondary | ICD-10-CM | POA: Diagnosis not present

## 2019-06-17 DIAGNOSIS — L84 Corns and callosities: Secondary | ICD-10-CM | POA: Diagnosis not present

## 2019-06-18 ENCOUNTER — Telehealth: Payer: Self-pay | Admitting: Pulmonary Disease

## 2019-06-19 NOTE — Telephone Encounter (Signed)
pft scheduled on 12/9

## 2019-06-22 ENCOUNTER — Other Ambulatory Visit: Payer: Self-pay | Admitting: Family Medicine

## 2019-07-15 ENCOUNTER — Other Ambulatory Visit: Payer: Self-pay | Admitting: Pulmonary Disease

## 2019-07-23 ENCOUNTER — Encounter: Payer: Self-pay | Admitting: Family Medicine

## 2019-07-23 ENCOUNTER — Ambulatory Visit (INDEPENDENT_AMBULATORY_CARE_PROVIDER_SITE_OTHER): Payer: Medicare Other | Admitting: Family Medicine

## 2019-07-23 ENCOUNTER — Other Ambulatory Visit: Payer: Self-pay

## 2019-07-23 VITALS — BP 146/60 | HR 89 | Temp 97.9°F | Resp 18 | Ht 62.0 in | Wt 135.6 lb

## 2019-07-23 DIAGNOSIS — E118 Type 2 diabetes mellitus with unspecified complications: Secondary | ICD-10-CM

## 2019-07-23 DIAGNOSIS — R61 Generalized hyperhidrosis: Secondary | ICD-10-CM | POA: Diagnosis not present

## 2019-07-23 DIAGNOSIS — E1151 Type 2 diabetes mellitus with diabetic peripheral angiopathy without gangrene: Secondary | ICD-10-CM

## 2019-07-23 DIAGNOSIS — E1169 Type 2 diabetes mellitus with other specified complication: Secondary | ICD-10-CM

## 2019-07-23 DIAGNOSIS — E785 Hyperlipidemia, unspecified: Secondary | ICD-10-CM

## 2019-07-23 DIAGNOSIS — I10 Essential (primary) hypertension: Secondary | ICD-10-CM | POA: Diagnosis not present

## 2019-07-23 DIAGNOSIS — I700 Atherosclerosis of aorta: Secondary | ICD-10-CM

## 2019-07-23 LAB — COMPREHENSIVE METABOLIC PANEL
ALT: 18 U/L (ref 0–35)
AST: 21 U/L (ref 0–37)
Albumin: 4 g/dL (ref 3.5–5.2)
Alkaline Phosphatase: 57 U/L (ref 39–117)
BUN: 15 mg/dL (ref 6–23)
CO2: 26 mEq/L (ref 19–32)
Calcium: 9 mg/dL (ref 8.4–10.5)
Chloride: 104 mEq/L (ref 96–112)
Creatinine, Ser: 1.15 mg/dL (ref 0.40–1.20)
GFR: 56.39 mL/min — ABNORMAL LOW (ref >60.00)
Glucose, Bld: 126 mg/dL — ABNORMAL HIGH (ref 70–99)
Potassium: 3.8 mEq/L (ref 3.5–5.1)
Sodium: 141 mEq/L (ref 135–145)
Total Bilirubin: 0.4 mg/dL (ref 0.2–1.2)
Total Protein: 6.6 g/dL (ref 6.0–8.3)

## 2019-07-23 LAB — CBC WITH DIFFERENTIAL/PLATELET
Basophils Absolute: 0 10*3/uL (ref 0.0–0.1)
Basophils Relative: 0.5 % (ref 0.0–3.0)
Eosinophils Absolute: 0.2 10*3/uL (ref 0.0–0.7)
Eosinophils Relative: 3.4 % (ref 0.0–5.0)
HCT: 36.4 % (ref 36.0–46.0)
Hemoglobin: 12 g/dL (ref 12.0–15.0)
Lymphocytes Relative: 35.5 % (ref 12.0–46.0)
Lymphs Abs: 1.9 10*3/uL (ref 0.7–4.0)
MCHC: 32.9 g/dL (ref 30.0–36.0)
MCV: 89.7 fl (ref 78.0–100.0)
Monocytes Absolute: 0.4 10*3/uL (ref 0.1–1.0)
Monocytes Relative: 6.6 % (ref 3.0–12.0)
Neutro Abs: 2.9 10*3/uL (ref 1.4–7.7)
Neutrophils Relative %: 54 % (ref 43.0–77.0)
Platelets: 236 10*3/uL (ref 150.0–400.0)
RBC: 4.05 Mil/uL (ref 3.87–5.11)
RDW: 14.6 % (ref 11.5–15.5)
WBC: 5.3 10*3/uL (ref 4.0–10.5)

## 2019-07-23 LAB — HEMOGLOBIN A1C: Hgb A1c MFr Bld: 6.1 % (ref 4.6–6.5)

## 2019-07-23 LAB — LIPID PANEL
Cholesterol: 156 mg/dL (ref 0–200)
HDL: 59.7 mg/dL (ref >39.00)
LDL Cholesterol: 79 mg/dL (ref 0–99)
NonHDL: 96.28
Total CHOL/HDL Ratio: 3
Triglycerides: 88 mg/dL (ref 0.0–149.0)
VLDL: 17.6 mg/dL (ref 0.0–40.0)

## 2019-07-23 LAB — MICROALBUMIN / CREATININE URINE RATIO
Creatinine,U: 82.5 mg/dL
Microalb Creat Ratio: 0.8 mg/g (ref 0.0–30.0)
Microalb, Ur: <0.7 mg/dL (ref 0.0–1.9)

## 2019-07-23 NOTE — Progress Notes (Signed)
Patient ID: Christine Meyer, female    DOB: 06/25/1951  Age: 68 y.o. MRN: [id number]    Subjective:  Subjective  HPI Christine Meyer presents for f/u bp cho and bp.   Pt also c/o swelling in the R leg but it is gone now  HYPERTENSION   Blood pressure range- not checking   Chest pain- no      Dyspnea- no Lightheadedness- no   Edema- yes  Other side effects - no   Medication compliance: good Low salt diet- yes    DIABETES    Blood Sugar ranges- 99-110 Polyuria- no New Visual problems- no  Hypoglycemic symptoms- no  Other side effects-no Medication compliance - good Last eye exam- due Foot exam- today   HYPERLIPIDEMIA  Medication compliance- good RUQ pain- no  Muscle aches- no Other side effects-no      Review of Systems  Constitutional: Negative for activity change, appetite change, diaphoresis, fatigue and unexpected weight change.  Eyes: Negative for pain, redness and visual disturbance.  Respiratory: Negative for cough, chest tightness, shortness of breath and wheezing.   Cardiovascular: Negative for chest pain, palpitations and leg swelling.  Endocrine: Negative for cold intolerance, heat intolerance, polydipsia, polyphagia and polyuria.  Genitourinary: Negative for difficulty urinating, dysuria and frequency.  Neurological: Negative for dizziness, light-headedness, numbness and headaches.  Psychiatric/Behavioral: Negative for behavioral problems and dysphoric mood. The patient is not nervous/anxious.     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   CAD (coronary artery disease)    Stent RCA 1999   COPD (chronic obstructive pulmonary disease) (HCC)    Diabetes mellitus, type 2 ([place])    Diet controlled   Gastric polyps - hyperplastic 02/23/2018   Hyperlipidemia    Hypertension    OSA (obstructive sleep apnea) [name]    PVD (peripheral vascular disease) (HCC)    Carotid stenosis, renal artery stenosis   Sleep apnea    dx'd but doesn't use mask    Uterine cancer ([place]) ?6948'N    She has a past surgical history that includes Renal artery stent (Right, May 2007; 08/2006); Coronary angioplasty with stent (1999); Colonoscopy (2000); Iliac artery stent (Right, 11/22/2014); Total abdominal hysterectomy (?1980's); lower extremity angiogram (N/A, 11/22/2014); and abdominal angiogram (11/22/2014).   Her family history includes Breast cancer in her daughter; Deep vein thrombosis in her mother; Diabetes in her brother, maternal aunt, maternal grandmother, mother, and sister; Emphysema in her brother; Heart attack in her paternal aunt; Heart failure in her mother; Hypertension in her brother, father, and mother.She reports that she has been smoking cigarettes. She has a 5.10 pack-year smoking history. She has never used smokeless tobacco. She reports that she does not drink alcohol or use drugs.  Current Outpatient Medications on File Prior to Visit  Medication Sig Dispense Refill   ACCU-CHEK SOFTCLIX LANCETS lancets Check blood sugar once daily. Dx:E11.9 100 each 5   alendronate (FOSAMAX) 70 MG tablet TAKE 1 TABLET(70 MG) BY MOUTH 1 TIME A WEEK WITH A FULL GLASS OF WATER AND ON AN EMPTY STOMACH 12 tablet 3   aspirin EC 81 MG tablet Take 1 tablet (81 mg total) by mouth daily. 90 tablet 0   atorvastatin (LIPITOR) 40 MG tablet TAKE 1 TABLET BY MOUTH  DAILY 90 tablet 3   Blood Glucose Monitoring Suppl (ONETOUCH VERIO) w/Device KIT Use daily to check blood sugar.   DX E11.9 1  kit 0   budesonide-formoterol (SYMBICORT) 160-4.5 MCG/ACT inhaler Inhale 2 puffs into the lungs 2 (two) times daily. 1 Inhaler 0   Calcium 500-100 MG-UNIT CHEW CHEW AND SWALLOW ONE TABLET DAILY WITH BREAKFAST 150 tablet 0   Cholecalciferol (VITAMIN D3) 50 MCG (2000 UT) capsule TAKE 1 CAPSULE BY MOUTH DAILY 60 capsule 11   clopidogrel (PLAVIX) 75 MG tablet Take 1 tablet (75 mg total) by mouth  daily. 90 tablet 3   ESBRIET 801 MG TABS TAKE 1 TABLET (801MG) BY  MOUTH THREE TIMES DAILY  WITH FOOD 90 tablet 0   gabapentin (NEURONTIN) 100 MG capsule Take 2 capsules (200 mg total) by mouth at bedtime. 60 capsule 0   meloxicam (MOBIC) 7.5 MG tablet TAKE 1 TABLET(7.5 MG) BY MOUTH AS NEEDED 30 tablet 0   metoprolol succinate (TOPROL-XL) 25 MG 24 hr tablet TAKE 1 TABLET BY MOUTH DAILY 90 tablet 1   Multiple Vitamins-Minerals (A THRU Z SELECT 50+ ADVANCED) TABS TAKE 1 TABLET BY MOUTH DAILY 220 tablet 1   olmesartan (BENICAR) 20 MG tablet Take 1 tablet (20 mg total) by mouth daily. 90 tablet 1   ondansetron (ZOFRAN ODT) 4 MG disintegrating tablet 77m ODT q6 hours prn nausea/vomit 6 tablet 0   ONETOUCH VERIO test strip USE TO TEST BLOOD SUGAR ONCE DAILY 75 each 0   pantoprazole (PROTONIX) 20 MG tablet TAKE 1 TABLET(20 MG) BY MOUTH DAILY BEFORE BREAKFAST 90 tablet 3   Pirfenidone (ESBRIET) 267 MG CAPS TAKE 2 CAPSULES BY MOUTH THREE TIMES A DAY (Patient taking differently: TAKE 1 CAPSULE BY MOUTH THREE TIMES A DAY) 180 capsule 11   spironolactone (ALDACTONE) 25 MG tablet TAKE 1 TABLET(25 MG) BY MOUTH DAILY 90 tablet 1   SYMBICORT 160-4.5 MCG/ACT inhaler INHALE 2 PUFFS INTO THE LUNGS TWICE DAILY 10.2 g 1   SYRINGE-NEEDLE, DISP, 3 ML (BD INTEGRA SYRINGE) 25G X 1" 3 ML MISC USE TO INJECT B12 ONCE A MONTH 12 each 1   traMADol (ULTRAM) 50 MG tablet Take 1 tablet (50 mg total) by mouth every 6 (six) hours as needed. 15 tablet 0   No current facility-administered medications on file prior to visit.      Objective:  Objective  Physical Exam Vitals signs and nursing note reviewed.  Constitutional:      Appearance: She is well-developed.  HENT:     Head: Normocephalic and atraumatic.  Eyes:     Conjunctiva/sclera: Conjunctivae normal.  Neck:     Musculoskeletal: Normal range of motion and neck supple.     Thyroid: No thyromegaly.     Vascular: No carotid bruit or JVD.    Cardiovascular:     Rate and Rhythm: Normal rate and regular rhythm.     Heart sounds: Normal heart sounds. No murmur.  Pulmonary:     Effort: Pulmonary effort is normal. No respiratory distress.     Breath sounds: Normal breath sounds. No wheezing or rales.  Chest:     Chest wall: No tenderness.  Neurological:     Mental Status: She is alert and oriented to person, place, and time.    Diabetic Foot Exam - Simple   Simple Foot Form Diabetic Foot exam was performed with the following findings: Yes 07/23/2019 12:57 PM  Visual Inspection No deformities, no ulcerations, no other skin breakdown bilaterally: Yes Sensation Testing Intact to touch and monofilament testing bilaterally: Yes Pulse Check Posterior Tibialis and Dorsalis pulse intact bilaterally: Yes Comments     BP (Marland Kitchen  146/60 (BP Location: Left Arm, Patient Position: Sitting, Cuff Size: Normal)    Pulse 89    Temp 97.9 F (36.6 C) (Temporal)    Resp 18    Ht 5' 2" (1.575 m)    Wt 135 lb 9.6 oz (61.5 kg)    SpO2 99%    BMI 24.80 kg/m  Wt Readings from Last 3 Encounters:  07/23/19 135 lb 9.6 oz (61.5 kg)  03/09/19 140 lb (63.5 kg)  02/13/19 140 lb (63.5 kg)     Lab Results  Component Value Date   WBC 5.3 07/23/2019   HGB 12.0 07/23/2019   HCT 36.4 07/23/2019   PLT 236.0 07/23/2019   GLUCOSE 126 (H) 07/23/2019   CHOL 156 07/23/2019   TRIG 88.0 07/23/2019   HDL 59.70 07/23/2019   LDLCALC 79 07/23/2019   ALT 18 07/23/2019   AST 21 07/23/2019   NA 141 07/23/2019   K 3.8 07/23/2019   CL 104 07/23/2019   CREATININE 1.15 07/23/2019   BUN 15 07/23/2019   CO2 26 07/23/2019   TSH 1.41 04/22/2017   INR 0.95 11/11/2014   HGBA1C 6.1 07/23/2019   MICROALBUR <0.7 07/23/2019    Vas [place] Abi With/wo Tbi  Result Date: 08/28/2018 LOWER EXTREMITY DOPPLER STUDY Indications: In 08/2017, an arterial Doppler showed an ABI of .65, bilaterally.              Patient states she has no pain in her legs with or without walking.               She does state that when she walks her legs become tired as if they              are going to "give out" on her. She also c/o intermittent bilateral              cramping throughout the shin areas for the past 6 months. High Risk Factors: Hypertension, hyperlipidemia, Diabetes, current smoker,                    coronary artery disease.  Other Factors: COPD.   Vascular Interventions: History of right external iliac artery and right renal                         artery stenting. Successful ostial and distal right                         common iliac artery stenting with a Balloon expandable                         stent in the ostium of the RCIA and nitinol                         self-expanding stent in the mid with excellent                         angiographic result on 11/22/2014. Angiogram also                         revealed an occluded right renal artery stent and                         high-grade in-stent restenosis in the right external  iliac artery; both areas were stented. Performing Technologist: Sharlett Iles RVT  Examination Guidelines: A complete evaluation includes at minimum, Doppler waveform signals and systolic blood pressure reading at the level of bilateral brachial, anterior tibial, and posterior tibial arteries, when vessel segments are accessible. Bilateral testing is considered an integral part of a complete examination. Photoelectric Plethysmograph (PPG) waveforms and toe systolic pressure readings are included as required and additional duplex testing as needed. Limited examinations for reoccurring indications may be performed as noted.  ABI Findings: +---------+------------------+-----+--------+--------+  Right     Rt Pressure (mmHg) Index Waveform Comment   +---------+------------------+-----+--------+--------+  Brachial  170                                         +---------+------------------+-----+--------+--------+  ATA       76                 0.44   biphasic           +---------+------------------+-----+--------+--------+  PTA       76                 0.44  biphasic           +---------+------------------+-----+--------+--------+  PERO      64                 0.37  biphasic dampened  +---------+------------------+-----+--------+--------+  Great Toe 33                 0.19  Abnormal           +---------+------------------+-----+--------+--------+ +---------+------------------+-----+-----------+-------+  Left      Lt Pressure (mmHg) Index Waveform    Comment  +---------+------------------+-----+-----------+-------+  Brachial  174                                           +---------+------------------+-----+-----------+-------+  ATA       71                 0.41  biphasic             +---------+------------------+-----+-----------+-------+  PTA       80                 0.46  biphasic             +---------+------------------+-----+-----------+-------+  PERO      64                 0.37  multiphasic          +---------+------------------+-----+-----------+-------+  Great Toe 29                 0.17  Abnormal             +---------+------------------+-----+-----------+-------+ +-------+-----------+-----------+------------+------------+  ABI/TBI Today's ABI Today's TBI Previous ABI Previous TBI  +-------+-----------+-----------+------------+------------+  Right   .44         .19         .65          .49           +-------+-----------+-----------+------------+------------+  Left    .46         .17         .65          .  33           +-------+-----------+-----------+------------+------------+ Right ABIs appear decreased compared to prior study on 08/13/2017. Left ABIs appear decreased compared to prior study on 08/13/2017.  Summary: Right: Resting right ankle-brachial index indicates severe right lower extremity arterial disease. The right toe-brachial index is abnormal. Left: Resting left ankle-brachial index indicates severe left lower extremity arterial disease. The  left toe-brachial index is abnormal. *See table(s) above for measurements and observations. Vascular consult recommended. Electronically signed by Jenkins Rouge MD on 08/28/2018 at 9:54:15 AM.    Final    Vas US Aorta/ivc/iliacs  Result Date: 08/28/2018 ABDOMINAL AORTA STUDY Indications: In 08/2017, an aortoiliac duplex showed both right common and              external iliac artery stent patency without focal stenosis where              visualized and >50% stenosis in the left mid common iliac artery              and distal external iliac artery. Patient states she has no pain in              her legs with or without walking. She does state that when she              walks her legs become tired as if they are going to "give out" on              her. She also c/o intermittent bilateral cramping throughout the              shin areas for the past 6 months. Risk Factors: Hypertension, hyperlipidemia, Diabetes, current smoker, coronary               artery disease. Other Factors: COPD. Vascular Interventions: History of right external iliac artery and right renal                         artery stenting. Successful ostial and distal right                         common iliac artery stenting with a Balloon expandable                         stent in the ostium of the RCIA and nitinol                         self-expanding stent in the mid with excellent                         angiographic result on 11/22/2014. Angiogram also                         revealed an occluded right renal artery stent and                         high-grade in-stent restenosis in the right external                         iliac artery; both areas were stented. Limitations: Air/bowel gas.  Examination Guidelines: A complete evaluation includes B-mode imaging, spectral Doppler, color Doppler, and power Doppler as needed of all accessible portions of each  vessel. Bilateral testing is considered an integral part of a complete examination.  Limited examinations for reoccurring indications may be performed as noted.  Abdominal Aorta Findings: +-------------+-------+----------+----------+-----------+--------+---------+  Location      AP (cm) Trans (cm) PSV (cm/s) Waveform    Thrombus Comments   +-------------+-------+----------+----------+-----------+--------+---------+  Proximal      2.10    2.20       67                                         +-------------+-------+----------+----------+-----------+--------+---------+  Mid                              59                                         +-------------+-------+----------+----------+-----------+--------+---------+  Distal                           62                                         +-------------+-------+----------+----------+-----------+--------+---------+  RT CIA Prox                      80         multiphasic          broadened  +-------------+-------+----------+----------+-----------+--------+---------+  LT CIA Prox                      77         multiphasic          broadened  +-------------+-------+----------+----------+-----------+--------+---------+  LT CIA Mid                       336        monophasic                      +-------------+-------+----------+----------+-----------+--------+---------+  LT CIA Distal NV                                                            +-------------+-------+----------+----------+-----------+--------+---------+  LT EIA Prox                      182        monophasic                      +-------------+-------+----------+----------+-----------+--------+---------+  LT EIA Mid                       148        monophasic                      +-------------+-------+----------+----------+-----------+--------+---------+  LT EIA Distal  171        monophasic                      +-------------+-------+----------+----------+-----------+--------+---------+ Aortoiliac atherosclerosis with stable focal stenosis noted in the left mid  common iliac artery; mildly elevated velocities throughout the left external iliac artery (unable to duplicate elevated velocities of 237 cm/s in the distal segment from prior exam). Visualized portions of the right common and external iliac artery stents appear patent without restenosis. Elevated velocities noted in the proximal celiac artery at 407/135 cm/s and in the proximal SMA at 430/97 cm/s.  IVC/Iliac Findings: +--------+------+--------+--------+    IVC    Patent Thrombus Comments  +--------+------+--------+--------+  IVC Prox patent                    +--------+------+--------+--------+  Right Stent(s): +-------------------+--------+--------+--------+--------+  Common Iliac Artery PSV cm/s Stenosis Waveform Comments  +-------------------+--------+--------+--------+--------+  Prox to Stent       130               biphasic           +-------------------+--------+--------+--------+--------+  Proximal Stent      141               biphasic           +-------------------+--------+--------+--------+--------+  Mid Stent           140               biphasic           +-------------------+--------+--------+--------+--------+  Distal Stent        139               biphasic           +-------------------+--------+--------+--------+--------+  Distal to Stent     NV                                   +-------------------+--------+--------+--------+--------+ Patent right common iliac artery stent without restenosis; post stent area not visualized. +---------------------+--------+--------+--------+--------+  External Iliac Artery PSV cm/s Stenosis Waveform Comments  +---------------------+--------+--------+--------+--------+  Prox to Stent         NV                                   +---------------------+--------+--------+--------+--------+  Proximal Stent        NV                                   +---------------------+--------+--------+--------+--------+  Mid Stent             131               biphasic            +---------------------+--------+--------+--------+--------+  Distal Stent          140               biphasic           +---------------------+--------+--------+--------+--------+  Distal to Stent       166               biphasic           +---------------------+--------+--------+--------+--------+ Patent right external iliac artery stent without restenosis where visualized.  Limited visualization of post stent segment of the right common iliac artery, pre and proximal stent segment of the right external iliac artery and distal left common iliac artery due to overlying bowel gas. Summary: Abdominal Aorta: The largest aortic measurement is 2.2 cm. Stenosis: +-------------------+--------------------+  Location            Stenosis              +-------------------+--------------------+  Left Common Iliac   Stable >50% stenosis  +-------------------+--------------------+  Left External Iliac Now <50% stenosis     +-------------------+--------------------+ Patent right common and external iliac artery stents Patent IVC. 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  *See table(s) above for measurements and observations. Suggest follow up study in 12 months.  Electronically signed by Jenkins Rouge MD on 08/28/2018 at 9:53:32 AM.    Final      Assessment & Plan:  Plan  I am having Christine Meyer maintain her Accu-Chek Softclix Lancets, A Thru Z Select 50+ Advanced, aspirin EC, Pirfenidone, OneTouch Verio, traMADol, ondansetron, budesonide-formoterol, meloxicam, SYRINGE-NEEDLE (DISP) 3 ML, clopidogrel, metoprolol succinate, OneTouch Verio, Vitamin D3, pantoprazole, Calcium, atorvastatin, spironolactone, olmesartan, Symbicort, gabapentin, alendronate, and Esbriet.  No orders of the defined types were placed in this encounter.   Problem List Items Addressed This Visit      Unprioritized   DM (diabetes mellitus) type II, controlled, with peripheral vascular disorder ([place])    hgba1c to  be checked , minimize simple carbs. Increase exercise as tolerated. Continue current meds       Dyslipidemia    Tolerating statin, encouraged heart healthy diet, avoid trans fats, minimize simple carbs and saturated fats. Increase exercise as tolerated      Essential hypertension - Primary    Well controlled, no changes to meds. Encouraged heart healthy diet such as the DASH diet and exercise as tolerated.       Relevant Orders   Microalbumin / creatinine urine ratio (Completed)    Other Visit Diagnoses    Hyperlipidemia associated with type 2 diabetes mellitus ([place])       Relevant Orders   Lipid panel (Completed)   Comprehensive metabolic panel (Completed)   Controlled type 2 diabetes mellitus with complication, without long-term current use of insulin (HCC)       Relevant Orders   Hemoglobin A1c (Completed)   Comprehensive metabolic panel (Completed)   Microalbumin / creatinine urine ratio (Completed)   Night sweats       Relevant Orders   Thyroid Panel With TSH   CBC with Differential (Completed)   Aortic atherosclerosis ([place])                   con't statin and asa 81 mg   Follow-up: Return in about 6 months (around 01/20/2020), or if symptoms worsen or fail to improve, for annual exam, fasting.  Ann Held, DO

## 2019-07-23 NOTE — Assessment & Plan Note (Signed)
Tolerating statin, encouraged heart healthy diet, avoid trans fats, minimize simple carbs and saturated fats. Increase exercise as tolerated 

## 2019-07-23 NOTE — Patient Instructions (Signed)
Carbohydrate Counting for Diabetes Mellitus, Adult  Carbohydrate counting is a method of keeping track of how many carbohydrates you eat. Eating carbohydrates naturally increases the amount of sugar (glucose) in the blood. Counting how many carbohydrates you eat helps keep your blood glucose within normal limits, which helps you manage your diabetes (diabetes mellitus). It is important to know how many carbohydrates you can safely have in each meal. This is different for every person. A diet and nutrition specialist (registered dietitian) can help you make a meal plan and calculate how many carbohydrates you should have at each meal and snack. Carbohydrates are found in the following foods:  Grains, such as breads and cereals.  Dried beans and soy products.  Starchy vegetables, such as potatoes, peas, and corn.  Fruit and fruit juices.  Milk and yogurt.  Sweets and snack foods, such as cake, cookies, candy, chips, and soft drinks. How do I count carbohydrates? There are two ways to count carbohydrates in food. You can use either of the methods or a combination of both. Reading "Nutrition Facts" on packaged food The "Nutrition Facts" list is included on the labels of almost all packaged foods and beverages in the [place] It includes:  The serving size.  Information about nutrients in each serving, including the grams (g) of carbohydrate per serving. To use the "Nutrition Facts":  Decide how many servings you will have.  Multiply the number of servings by the number of carbohydrates per serving.  The resulting number is the total amount of carbohydrates that you will be having. Learning standard serving sizes of other foods When you eat carbohydrate foods that are not packaged or do not include "Nutrition Facts" on the label, you need to measure the servings in order to count the amount of carbohydrates:  Measure the foods that you will eat with a food scale or measuring cup, if needed.   Decide how many standard-size servings you will eat.  Multiply the number of servings by 15. Most carbohydrate-rich foods have about 15 g of carbohydrates per serving. ? For example, if you eat 8 oz (170 g) of strawberries, you will have eaten 2 servings and 30 g of carbohydrates (2 servings x 15 g = 30 g).  For foods that have more than one food mixed, such as soups and casseroles, you must count the carbohydrates in each food that is included. The following list contains standard serving sizes of common carbohydrate-rich foods. Each of these servings has about 15 g of carbohydrates:   hamburger bun or  English muffin.   oz (15 mL) syrup.   oz (14 g) jelly.  1 slice of bread.  1 six-inch tortilla.  3 oz (85 g) cooked rice or pasta.  4 oz (113 g) cooked dried beans.  4 oz (113 g) starchy vegetable, such as peas, corn, or potatoes.  4 oz (113 g) hot cereal.  4 oz (113 g) mashed potatoes or  of a large baked potato.  4 oz (113 g) canned or frozen fruit.  4 oz (120 mL) fruit juice.  4-6 crackers.  6 chicken nuggets.  6 oz (170 g) unsweetened dry cereal.  6 oz (170 g) plain fat-free yogurt or yogurt sweetened with artificial sweeteners.  8 oz (240 mL) milk.  8 oz (170 g) fresh fruit or one small piece of fruit.  24 oz (680 g) popped popcorn. Example of carbohydrate counting Sample meal  3 oz (85 g) chicken breast.  6 oz (170 g)   brown rice.  4 oz (113 g) corn.  8 oz (240 mL) milk.  8 oz (170 g) strawberries with sugar-free whipped topping. Carbohydrate calculation 1. Identify the foods that contain carbohydrates: ? Rice. ? Corn. ? Milk. ? Strawberries. 2. Calculate how many servings you have of each food: ? 2 servings rice. ? 1 serving corn. ? 1 serving milk. ? 1 serving strawberries. 3. Multiply each number of servings by 15 g: ? 2 servings rice x 15 g = 30 g. ? 1 serving corn x 15 g = 15 g. ? 1 serving milk x 15 g = 15 g. ? 1 serving  strawberries x 15 g = 15 g. 4. Add together all of the amounts to find the total grams of carbohydrates eaten: ? 30 g + 15 g + 15 g + 15 g = 75 g of carbohydrates total. Summary  Carbohydrate counting is a method of keeping track of how many carbohydrates you eat.  Eating carbohydrates naturally increases the amount of sugar (glucose) in the blood.  Counting how many carbohydrates you eat helps keep your blood glucose within normal limits, which helps you manage your diabetes.  A diet and nutrition specialist (registered dietitian) can help you make a meal plan and calculate how many carbohydrates you should have at each meal and snack. This information is not intended to replace advice given to you by your health care provider. Make sure you discuss any questions you have with your health care provider. Document Released: 08/27/2005 Document Revised: 03/21/2017 Document Reviewed: 02/08/2016 Elsevier Patient Education  2020 Elsevier Inc.  

## 2019-07-23 NOTE — Assessment & Plan Note (Signed)
Well controlled, no changes to meds. Encouraged heart healthy diet such as the DASH diet and exercise as tolerated.  °

## 2019-07-23 NOTE — Assessment & Plan Note (Signed)
hgba1c to be checked, minimize simple carbs. Increase exercise as tolerated. Continue current meds  

## 2019-07-24 LAB — THYROID PANEL WITH TSH
Free Thyroxine Index: 2 (ref 1.4–3.8)
T3 Uptake: 30 % (ref 22–35)
T4, Total: 6.7 ug/dL (ref 5.1–11.9)
TSH: 2.42 mIU/L (ref 0.40–4.50)

## 2019-07-28 ENCOUNTER — Other Ambulatory Visit: Payer: Self-pay | Admitting: *Deleted

## 2019-07-28 MED ORDER — METOPROLOL SUCCINATE ER 25 MG PO TB24: 25.0000 mg | ORAL_TABLET | Freq: Every day | ORAL | 1 refills | Status: DC

## 2019-08-12 ENCOUNTER — Other Ambulatory Visit: Payer: Self-pay | Admitting: Pulmonary Disease

## 2019-08-15 ENCOUNTER — Other Ambulatory Visit (HOSPITAL_COMMUNITY)
Admission: RE | Admit: 2019-08-15 | Discharge: 2019-08-15 | Disposition: A | Discharge status: Home / Self Care | Payer: Medicare Other | Source: Ambulatory Visit | Attending: Pulmonary Disease | Admitting: Pulmonary Disease

## 2019-08-15 DIAGNOSIS — Z01812 Encounter for preprocedural laboratory examination: Secondary | ICD-10-CM | POA: Diagnosis not present

## 2019-08-15 DIAGNOSIS — Z20828 Contact with and (suspected) exposure to other viral communicable diseases: Secondary | ICD-10-CM | POA: Insufficient documentation

## 2019-08-17 LAB — NOVEL CORONAVIRUS, NAA (HOSP ORDER, SEND-OUT TO REF LAB; TAT 18-24 HRS): SARS-CoV-2, NAA: NOT DETECTED

## 2019-08-18 ENCOUNTER — Other Ambulatory Visit: Payer: Self-pay | Admitting: *Deleted

## 2019-08-18 DIAGNOSIS — J432 Centrilobular emphysema: Secondary | ICD-10-CM

## 2019-08-19 ENCOUNTER — Ambulatory Visit (INDEPENDENT_AMBULATORY_CARE_PROVIDER_SITE_OTHER): Payer: Medicare Other | Admitting: Pulmonary Disease

## 2019-08-19 ENCOUNTER — Other Ambulatory Visit: Payer: Self-pay

## 2019-08-19 ENCOUNTER — Encounter: Payer: Self-pay | Admitting: Pulmonary Disease

## 2019-08-19 VITALS — BP 116/62 | HR 73 | Temp 98.1°F | Ht 63.0 in | Wt 137.0 lb

## 2019-08-19 DIAGNOSIS — J84112 Idiopathic pulmonary fibrosis: Secondary | ICD-10-CM

## 2019-08-19 DIAGNOSIS — J432 Centrilobular emphysema: Secondary | ICD-10-CM

## 2019-08-19 LAB — PULMONARY FUNCTION TEST
DL/VA % pred: 75 %
DL/VA: 3.16 ml/min/mmHg/L
DLCO cor % pred: 51 %
DLCO cor: 9.76 ml/min/mmHg
DLCO unc % pred: 49 %
DLCO unc: 9.31 ml/min/mmHg
FEF 25-75 Post: 1.52 L/sec
FEF 25-75 Pre: 1.14 L/sec
FEF2575-%Change-Post: 33 %
FEF2575-%Pred-Post: 93 %
FEF2575-%Pred-Pre: 70 %
FEV1-%Change-Post: 9 %
FEV1-%Pred-Post: 101 %
FEV1-%Pred-Pre: 93 %
FEV1-Post: 1.78 L
FEV1-Pre: 1.63 L
FEV1FVC-%Change-Post: 11 %
FEV1FVC-%Pred-Pre: 90 %
FEV6-%Change-Post: -3 %
FEV6-%Pred-Post: 103 %
FEV6-%Pred-Pre: 107 %
FEV6-Post: 2.24 L
FEV6-Pre: 2.33 L
FEV6FVC-%Pred-Post: 104 %
FEV6FVC-%Pred-Pre: 104 %
FVC-%Change-Post: -2 %
FVC-%Pred-Post: 101 %
FVC-%Pred-Pre: 103 %
FVC-Post: 2.28 L
FVC-Pre: 2.33 L
Post FEV1/FVC ratio: 78 %
Post FEV6/FVC ratio: 100 %
Pre FEV1/FVC ratio: 70 %
Pre FEV6/FVC Ratio: 100 %
RV % pred: 94 %
RV: 2.02 L
TLC % pred: 78 %
TLC: 3.86 L

## 2019-08-19 NOTE — Progress Notes (Signed)
Full PFT performed today. °

## 2019-08-19 NOTE — Progress Notes (Signed)
Christine Meyer    [id number]    09/30/1950  Primary Care Physician:Lowne Cheri Rous Alferd Apa, DO  Referring Physician: Carollee Herter, Alferd Apa, DO [address] [place],  [place] [address]  Chief complaint: Follow-up for COPD, idiopathic pulmonary fibrosis  HPI: Christine Meyer was diagnosed with IPF in 2016 after CT scan showing UIP pulmonary fibrosis and centrilobular emphysema.  She has significant smoking history.  Evaluated by rheumatology in 2016 by Dr. Trudie Reed for mildly elevated rheumatoid factor with no evidence of connective tissue disease She is started on Esbriet in 2016 and has tolerated it well with no issues   Pets: Dogs, no cats, birds Occupation: Retired Training and development officer Exposures: No known exposures.  No mold, hot tub, Jacuzzi, no feather pillows or comforter Smoking history: 13-pack-year smoker.  Continues to smoke every day Travel history: No significant travel history Relevant family history: No significant family history of lung disease  Outpatient Encounter Medications as of 08/19/2019  Medication Sig  . ACCU-CHEK SOFTCLIX LANCETS lancets Check blood sugar once daily. Dx:E11.9  . alendronate (FOSAMAX) 70 MG tablet TAKE 1 TABLET(70 MG) BY MOUTH 1 TIME A WEEK WITH A FULL GLASS OF WATER AND ON AN EMPTY STOMACH  . aspirin EC 81 MG tablet Take 1 tablet (81 mg total) by mouth daily.  Marland Kitchen atorvastatin (LIPITOR) 40 MG tablet TAKE 1 TABLET BY MOUTH  DAILY  . Blood Glucose Monitoring Suppl (ONETOUCH VERIO) w/Device KIT Use daily to check blood sugar.   DX E11.9  . budesonide-formoterol (SYMBICORT) 160-4.5 MCG/ACT inhaler Inhale 2 puffs into the lungs 2 (two) times daily.  . Calcium 500-100 MG-UNIT CHEW CHEW AND SWALLOW ONE TABLET DAILY WITH BREAKFAST  . Cholecalciferol (VITAMIN D3) 50 MCG (2000 UT) capsule TAKE 1 CAPSULE BY MOUTH DAILY  . clopidogrel (PLAVIX) 75 MG tablet Take 1 tablet (75 mg total) by mouth daily.  . ESBRIET 801 MG TABS TAKE 1 TABLET (801MG) BY  MOUTH  THREE TIMES DAILY  WITH FOOD  . gabapentin (NEURONTIN) 100 MG capsule Take 2 capsules (200 mg total) by mouth at bedtime.  . meloxicam (MOBIC) 7.5 MG tablet TAKE 1 TABLET(7.5 MG) BY MOUTH AS NEEDED  . metoprolol succinate (TOPROL-XL) 25 MG 24 hr tablet Take 1 tablet (25 mg total) by mouth daily.  . Multiple Vitamins-Minerals (A THRU Z SELECT 50+ ADVANCED) TABS TAKE 1 TABLET BY MOUTH DAILY  . olmesartan (BENICAR) 20 MG tablet Take 1 tablet (20 mg total) by mouth daily.  . ondansetron (ZOFRAN ODT) 4 MG disintegrating tablet 74m ODT q6 hours prn nausea/vomit  . ONETOUCH VERIO test strip USE TO TEST BLOOD SUGAR ONCE DAILY  . pantoprazole (PROTONIX) 20 MG tablet TAKE 1 TABLET(20 MG) BY MOUTH DAILY BEFORE BREAKFAST  . spironolactone (ALDACTONE) 25 MG tablet TAKE 1 TABLET(25 MG) BY MOUTH DAILY  . SYRINGE-NEEDLE, DISP, 3 ML (BD INTEGRA SYRINGE) 25G X 1" 3 ML MISC USE TO INJECT B12 ONCE A MONTH  . traMADol (ULTRAM) 50 MG tablet Take 1 tablet (50 mg total) by mouth every 6 (six) hours as needed.  . [DISCONTINUED] Pirfenidone (ESBRIET) 267 MG CAPS TAKE 2 CAPSULES BY MOUTH THREE TIMES A DAY (Patient taking differently: TAKE 1 CAPSULE BY MOUTH THREE TIMES A DAY)  . [DISCONTINUED] SYMBICORT 160-4.5 MCG/ACT inhaler INHALE 2 PUFFS INTO THE LUNGS TWICE DAILY   No facility-administered encounter medications on file as of 08/19/2019.     Allergies as of 08/19/2019  . (No  Known Allergies)    Past Medical History:  Diagnosis Date  . Anemia    NOS  . 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 CAD (coronary artery disease)    Stent RCA 1999  . COPD (chronic obstructive pulmonary disease) ([employer])   . Diabetes mellitus, type 2 (HCC)    Diet controlled  . Gastric polyps - hyperplastic 02/23/2018  . Hyperlipidemia   . Hypertension   . OSA (obstructive sleep apnea) [name]   . PVD (peripheral vascular disease) (HCC)    Carotid stenosis, renal artery stenosis  . Sleep apnea    dx'd but  doesn't use mask   . Uterine cancer ([place]) ?7342'A    Past Surgical History:  Procedure Laterality Date  . ABDOMINAL ANGIOGRAM  11/22/2014   Procedure: ABDOMINAL ANGIOGRAM;  Surgeon: Lorretta Harp, MD;  Location[facility] CATH LAB;  Service: Cardiovascular;;  . COLONOSCOPY  2000   negative  . CORONARY ANGIOPLASTY WITH STENT PLACEMENT  1999   RCA; Dr. Melvern Banker G4 P4  . ILIAC ARTERY STENT Right 11/22/2014  . LOWER EXTREMITY ANGIOGRAM N/A 11/22/2014   Procedure: LOWER EXTREMITY ANGIOGRAM;  Surgeon: Lorretta Harp, MD; L-SFA 100%, 3v runoff, R-pCIA 95>>0% w/ 8 mm x 18 mm long Balloon expandable stent; R-dCIA 67>>0% w/ 8 mm x 4 cm long Cordis Smart Nitinol self expanding stent   . RENAL ARTERY STENT Right May 2007; 08/2006   ; restenosis  . TOTAL ABDOMINAL HYSTERECTOMY  ?1980's    for uterine CA    Family History  Problem Relation Age of Onset  . Heart failure Mother        after hip fx.   . Hypertension Mother   . Diabetes Mother   . Deep vein thrombosis Mother   . Hypertension Father   . Diabetes Sister        insulin dependent  . Hypertension Brother   . Emphysema Brother   . Diabetes Brother        insulin dependent  . Diabetes Maternal Aunt        non-insulent dependent  . Heart attack Paternal Aunt   . Diabetes Maternal Grandmother        non-insulin dependent  . Breast cancer Daughter     Social History   Socioeconomic History  . Marital status: Single    Spouse name: Not on file  . Number of children: 4  . Years of education: Not on file  . Highest education level: Not on file  Occupational History  . 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    Employer: [employer]  Social Needs  . Financial resource strain: Not on file  . Food insecurity    Worry: Not on file    Inability: Not on file  . Transportation needs    Medical: Not on file    Non-medical: Not on file  Tobacco Use  . Smoking status: Current Every Day  Smoker    Packs/day: 0.25    Years: 51.00    Pack years: 12.75    Types: Cigarettes  . Smokeless tobacco: Never Used  Substance and Sexual Activity  . Alcohol use: No    Alcohol/week: 0.0 standard drinks  . Drug use: No  . Sexual activity: Yes  Lifestyle  . Physical activity    Days per week: Not on file    Minutes per session: Not on file  . Stress: Not on file  Relationships  .  Social Herbalist on phone: Not on file    Gets together: Not on file    Attends religious service: Not on file    Active member of club or organization: Not on file    Attends meetings of clubs or organizations: Not on file    Relationship status: Not on file  . Intimate partner violence    Fear of current or ex partner: Not on file    Emotionally abused: Not on file    Physically abused: Not on file    Forced sexual activity: Not on file  Other Topics Concern  . Not on file  Social History Narrative   Lives at home with grandson.    Daughter [name] is CMA Financial controller primary care [facility]   Retired Risk analyst   Smoker, no EtOH/drugs    Review of systems: Review of Systems  Constitutional: Negative for fever and chills.  HENT: Negative.   Eyes: Negative for blurred vision.  Respiratory: as per HPI  Cardiovascular: Negative for chest pain and palpitations.  Gastrointestinal: Negative for vomiting, diarrhea, blood per rectum. Genitourinary: Negative for dysuria, urgency, frequency and hematuria.  Musculoskeletal: Negative for myalgias, back pain and joint pain.  Skin: Negative for itching and rash.  Neurological: Negative for dizziness, tremors, focal weakness, seizures and loss of consciousness.  Endo/Heme/Allergies: Negative for environmental allergies.  Psychiatric/Behavioral: Negative for depression, suicidal ideas and hallucinations.  All other systems reviewed and are negative.  Physical Exam: Blood pressure 116/62, pulse 73, temperature 98.1 F (36.7 C), temperature  source Temporal, height 5' 3"  (1.6 m), weight 137 lb (62.1 kg), SpO2 98 %. Gen:      No acute distress HEENT:  EOMI, sclera anicteric Neck:     No masses; no thyromegaly Lungs:    Bibasal crackles CV:         Regular rate and rhythm; no murmurs Abd:      + bowel sounds; soft, non-tender; no palpable masses, no distension Ext:    No edema; adequate peripheral perfusion Skin:      Warm and dry; no rash Neuro: alert and oriented x 3 Psych: normal mood and affect  Data Reviewed: Imaging: CT chest 05/09/2018-mild emphysema, slight progression of interstitial lung disease with UIP pattern  PFTs: 08/19/19 FVC 2.28 [101%], FEV1 1.78 [101%], F/F 78, TLC 3.86 [78%], DLCO 9.31 [49%] Minimal obstruction, minimal restriction with moderate-severe diffusion defect Overall minimal change in FVC and stable diffusion capacity compared to prior.  Labs: 10/19/2014 ANA, Aldolase, CCP, SCL 70, SSA/SSB, Jo-1 all negative; however ESR 40, RF 17 (both elevated) 00/17/4944 metabolic panel-normal LFTs  Assessment:  IPF Tolerating Esbriet.  Continue current therapy Recent hepatic panel was normal Schedule high-res CT in 6 months  COPD Continue on Esbriet Smoking cessation encouraged but she is not interested in quitting.  Plan/Recommendations: - Continue Esbriet, symbicort - Follow-up CT in 6 months.  Marshell Garfinkel MD [facility] 08/19/2019, 10:54 AM  CC: Ann Held, *

## 2019-08-19 NOTE — Patient Instructions (Addendum)
I am glad you are stable with regard to the breathing The PFT showed minimal changes in lung function which is good news Continue the Symbicort and Esbriet Please continue to work on smoking cessation  We will schedule high-res CT in 6 months and follow-up in clinic after that

## 2019-08-24 ENCOUNTER — Other Ambulatory Visit: Payer: Self-pay | Admitting: Pulmonary Disease

## 2019-09-01 ENCOUNTER — Other Ambulatory Visit: Payer: Self-pay | Admitting: *Deleted

## 2019-09-01 ENCOUNTER — Ambulatory Visit (HOSPITAL_BASED_OUTPATIENT_CLINIC_OR_DEPARTMENT_OTHER)
Admission: RE | Admit: 2019-09-01 | Discharge: 2019-09-01 | Disposition: A | Discharge status: Home / Self Care | Payer: Medicare Other | Source: Ambulatory Visit | Attending: Cardiovascular Disease | Admitting: Cardiovascular Disease

## 2019-09-01 ENCOUNTER — Other Ambulatory Visit: Payer: Self-pay | Admitting: Cardiovascular Disease

## 2019-09-01 ENCOUNTER — Ambulatory Visit (HOSPITAL_COMMUNITY)
Admission: RE | Admit: 2019-09-01 | Discharge: 2019-09-01 | Disposition: A | Discharge status: Home / Self Care | Payer: Medicare Other | Source: Ambulatory Visit | Attending: Cardiovascular Disease | Admitting: Cardiovascular Disease

## 2019-09-01 ENCOUNTER — Other Ambulatory Visit: Payer: Self-pay

## 2019-09-01 DIAGNOSIS — I739 Peripheral vascular disease, unspecified: Secondary | ICD-10-CM | POA: Insufficient documentation

## 2019-09-01 DIAGNOSIS — I2510 Atherosclerotic heart disease of native coronary artery without angina pectoris: Secondary | ICD-10-CM | POA: Diagnosis not present

## 2019-09-01 DIAGNOSIS — I6529 Occlusion and stenosis of unspecified carotid artery: Secondary | ICD-10-CM

## 2019-09-09 ENCOUNTER — Ambulatory Visit (INDEPENDENT_AMBULATORY_CARE_PROVIDER_SITE_OTHER): Payer: Medicare Other | Admitting: Cardiovascular Disease

## 2019-09-09 ENCOUNTER — Other Ambulatory Visit: Payer: Self-pay

## 2019-09-09 ENCOUNTER — Encounter: Payer: Self-pay | Admitting: Cardiovascular Disease

## 2019-09-09 DIAGNOSIS — I739 Peripheral vascular disease, unspecified: Secondary | ICD-10-CM

## 2019-09-09 DIAGNOSIS — I6523 Occlusion and stenosis of bilateral carotid arteries: Secondary | ICD-10-CM | POA: Diagnosis not present

## 2019-09-09 NOTE — Progress Notes (Signed)
09/09/2019 Christine Meyer   27-Dec-1950  [id number]  Primary Physician Ann Held, DO Primary Cardiologist: Christine Meyer Christine Meyer, [place]  HPI:  Christine Meyer is a 68 y.o.  thin appearing single African-American female mother of 4 children . She was referred by Dr. Percival Spanish , her cardiologist, for peripheral vascular evaluation. I last saw her in the office  08/29/2018.Christine KitchenShe has a history of myocardial infarction back in 1999 undergoing stenting of her RCA by Dr. Melvern Banker.her chronic risk factors are notable for continued tobacco abuse, treated hypertension and hyperlipidemia. She is complained of increasing dyspnea on exertion over the last 6 months as well as right calf claudication. Dr. Percival Spanish saw her and ordered a stress test. 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 She has had right renal artery stenting in the past as well as right external iliac artery stenting as well. 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 I stented both of these areas. She did have an occluded left SFA with high-grade segmental diffuse right SFA stenosis. Her Dopplers and symptoms improved.  Since I saw her a year ago she continues to do well.  She is continuing to smoke however 3 cigarettes or so a day.  She denies chest pain but has chronic shortness of breath most likely related to COPD.  She denies claudication.  Her most recent lower extremity arterial Doppler studies performed 09/01/2019 revealed an occluded right SFA, patent right iliac stents with new high-grade left common iliac artery stenosis.  Carotid Dopplers in addition showed mild right and moderate left ICA stenosis.  No outpatient medications have been marked as taking for the 09/09/19 encounter (Office Visit) with  Christine Harp, Meyer.     No Known Allergies  Social History   Socioeconomic History  . Marital status: Single    Spouse name: Not on file  . Number of children: 4  . Years of education: Not on file  . Highest education level: Not on file  Occupational History  . 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    Employer: [employer]  Tobacco Use  . Smoking status: Current Every Day Smoker    Packs/day: 0.25    Years: 51.00    Pack years: 12.75    Types: Cigarettes  . Smokeless tobacco: Never Used  Substance and Sexual Activity  . Alcohol use: No    Alcohol/week: 0.0 standard drinks  . Drug use: No  . Sexual activity: Yes  Other Topics Concern  . Not on file  Social History Narrative   Lives at home with grandson.    Daughter Christine Meyer is CMA Financial controller primary care [facility]   Retired Risk analyst   Smoker, no EtOH/drugs   Social Determinants of Radio broadcast assistant Strain:   . Difficulty of Paying Living Expenses: Not on file  Food Insecurity:   . Worried About Charity fundraiser in the Last Year: Not on file  . Ran Out of Food in the Last Year: Not on file  Transportation Needs:   . Lack of Transportation (Medical): Not on file  . Lack of Transportation (Non-Medical): Not on file  Physical Activity:   . Days of Exercise per Week: Not on file  . Minutes of Exercise  per Session: Not on file  Stress:   . Feeling of Stress : Not on file  Social Connections:   . Frequency of Communication with Friends and Family: Not on file  . Frequency of Social Gatherings with Friends and Family: Not on file  . Attends Religious Services: Not on file  . Active Member of Clubs or Organizations: Not on file  . Attends Archivist Meetings: Not on file  . Marital Status: Not on file  Intimate Partner Violence:   . Fear of Current or Ex-Partner: Not on file  . Emotionally Abused: Not on file  . Physically Abused: Not on  file  . Sexually Abused: Not on file     Review of Systems: General: negative for chills, fever, night sweats or weight changes.  Cardiovascular: negative for chest pain, dyspnea on exertion, edema, orthopnea, palpitations, paroxysmal nocturnal dyspnea or shortness of breath Dermatological: negative for rash Respiratory: negative for cough or wheezing Urologic: negative for hematuria Abdominal: negative for nausea, vomiting, diarrhea, bright red blood per rectum, melena, or hematemesis Neurologic: negative for visual changes, syncope, or dizziness All other systems reviewed and are otherwise negative except as noted above.    Blood pressure (!) 142/50, pulse 78, temperature (!) 96.8 F (36 C), height 5\' 3"  (1.6 m), weight 138 lb (62.6 kg).  General appearance: alert and no distress Neck: no adenopathy, no JVD, supple, symmetrical, trachea midline, thyroid not enlarged, symmetric, no tenderness/mass/nodules and Bilateral carotid bruits Lungs: clear to auscultation bilaterally Heart: regular rate and rhythm, S1, S2 normal, no murmur, click, rub or gallop Extremities: extremities normal, atraumatic, no cyanosis or edema Pulses: Diminished pedal pulses bilaterally Skin: Skin color, texture, turgor normal. No rashes or lesions Neurologic: Alert and oriented X 3, normal strength and tone. Normal symmetric reflexes. Normal coordination and gait  EKG sinus rhythm 78 without ST or T wave changes.  I personally reviewed this EKG.  ASSESSMENT AND PLAN:   Peripheral arterial disease [name] has had prior right renal artery stenting and right iliac stenting in the past.  Her most recent angiogram which I performed 11/22/2014 revealed an occluded right renal artery and high-grade right external iliac artery "in-stent restenosis" with diffuse moderate disease in the right SFA and occluded left SFA.  I placed 2 stents in her right common and external iliac artery with improvement in her Dopplers and  symptoms.  Recent Dopplers performed 09/01/2019 revealed progression to occlusion of her right SFA, patent right iliac stent with high-grade left common iliac artery stenosis.  The patient denies symptoms of claudication.  We will continue to monitor her on an annual basis noninvasively.  Bilateral carotid artery disease (HCC) Recent carotid Doppler showed mild right and moderate left ICA stenosis.  This will be repeated in 12 months.      Christine Meyer FACP,FACC,FAHA, [facility] 09/09/2019 9:19 AM

## 2019-09-09 NOTE — Assessment & Plan Note (Signed)
Recent carotid Doppler showed mild right and moderate left ICA stenosis.  This will be repeated in 12 months.

## 2019-09-09 NOTE — Assessment & Plan Note (Signed)
Christine Meyer has had prior right renal artery stenting and right iliac stenting in the past.  Her most recent angiogram which I performed 11/22/2014 revealed an occluded right renal artery and high-grade right external iliac artery "in-stent restenosis" with diffuse moderate disease in the right SFA and occluded left SFA.  I placed 2 stents in her right common and external iliac artery with improvement in her Dopplers and symptoms.  Recent Dopplers performed 09/01/2019 revealed progression to occlusion of her right SFA, patent right iliac stent with high-grade left common iliac artery stenosis.  The patient denies symptoms of claudication.  We will continue to monitor her on an annual basis noninvasively.

## 2019-09-09 NOTE — Patient Instructions (Signed)
Medication Instructions:  Your physician recommends that you continue on your current medications as directed. Please refer to the Current Medication list given to you today.  If you need a refill on your cardiac medications before your next appointment, please call your pharmacy.   Lab work: NONE  Testing/Procedures: IN ONE YEAR Your physician has requested that you have a lower extremity arterial exercise duplex. During this test, exercise and ultrasound are used to evaluate arterial blood flow in the legs. Allow one hour for this exam. There are no restrictions or special instructions.  AND  Your physician has requested that you have an ankle brachial index (ABI). During this test an ultrasound and blood pressure cuff are used to evaluate the arteries that supply the arms and legs with blood. Allow thirty minutes for this exam. There are no restrictions or special instructions.  AND  Your physician has requested that you have a carotid duplex. This test is an ultrasound of the carotid arteries in your neck. It looks at blood flow through these arteries that supply the brain with blood. Allow one hour for this exam. There are no restrictions or special instructions.  Follow-Up: At [facility], you and your health needs are our priority.  As part of our continuing mission to provide you with exceptional heart care, we have created designated Provider Care Teams.  These Care Teams include your primary Cardiologist (physician) and Advanced Practice Providers (APPs -  Physician Assistants and Nurse Practitioners) who all work together to provide you with the care you need, when you need it. You may see Quay Burow, MD or one of the following Advanced Practice Providers on your designated Care Team:    Kerin Ransom, PA-C  [place], [place]  Coletta Memos, [place]  Your physician wants you to follow-up in: 1 year after ultrasounds

## 2019-09-10 ENCOUNTER — Other Ambulatory Visit: Payer: Self-pay | Admitting: *Deleted

## 2019-09-10 MED ORDER — CALCIUM 500-100 MG-UNIT PO CHEW: 1.0000 | CHEWABLE_TABLET | Freq: Every day | ORAL | 0 refills | Status: DC

## 2019-09-24 ENCOUNTER — Other Ambulatory Visit: Payer: Self-pay | Admitting: Family Medicine

## 2019-09-25 DIAGNOSIS — M79672 Pain in left foot: Secondary | ICD-10-CM | POA: Diagnosis not present

## 2019-09-25 DIAGNOSIS — E1159 Type 2 diabetes mellitus with other circulatory complications: Secondary | ICD-10-CM | POA: Diagnosis not present

## 2019-09-25 DIAGNOSIS — B351 Tinea unguium: Secondary | ICD-10-CM | POA: Diagnosis not present

## 2019-09-25 DIAGNOSIS — L84 Corns and callosities: Secondary | ICD-10-CM | POA: Diagnosis not present

## 2019-09-25 DIAGNOSIS — M79671 Pain in right foot: Secondary | ICD-10-CM | POA: Diagnosis not present

## 2019-09-25 MED ORDER — CALCIUM 500-100 MG-UNIT PO CHEW: 1.0000 | CHEWABLE_TABLET | Freq: Every day | ORAL | 1 refills | Status: DC

## 2019-10-26 ENCOUNTER — Other Ambulatory Visit: Payer: Self-pay | Admitting: Cardiovascular Disease

## 2019-10-26 ENCOUNTER — Other Ambulatory Visit: Payer: Self-pay | Admitting: Family Medicine

## 2019-10-26 DIAGNOSIS — I10 Essential (primary) hypertension: Secondary | ICD-10-CM

## 2019-10-28 ENCOUNTER — Telehealth: Payer: Self-pay | Admitting: Family Medicine

## 2019-10-28 ENCOUNTER — Other Ambulatory Visit: Payer: Self-pay | Admitting: *Deleted

## 2019-10-28 MED ORDER — OLMESARTAN MEDOXOMIL 20 MG PO TABS: 20.0000 mg | ORAL_TABLET | Freq: Every day | ORAL | 1 refills | Status: DC

## 2019-10-28 NOTE — Telephone Encounter (Signed)
Called patient to schedule AWV. Left message for patient to call office to schedule wellness visit. SF

## 2019-12-25 DIAGNOSIS — E1159 Type 2 diabetes mellitus with other circulatory complications: Secondary | ICD-10-CM | POA: Diagnosis not present

## 2019-12-25 DIAGNOSIS — L84 Corns and callosities: Secondary | ICD-10-CM | POA: Diagnosis not present

## 2019-12-25 DIAGNOSIS — M79671 Pain in right foot: Secondary | ICD-10-CM | POA: Diagnosis not present

## 2019-12-25 DIAGNOSIS — M79672 Pain in left foot: Secondary | ICD-10-CM | POA: Diagnosis not present

## 2019-12-25 DIAGNOSIS — B351 Tinea unguium: Secondary | ICD-10-CM | POA: Diagnosis not present

## 2020-01-20 DIAGNOSIS — H40033 Anatomical narrow angle, bilateral: Secondary | ICD-10-CM | POA: Diagnosis not present

## 2020-01-20 DIAGNOSIS — H524 Presbyopia: Secondary | ICD-10-CM | POA: Diagnosis not present

## 2020-01-21 ENCOUNTER — Ambulatory Visit (INDEPENDENT_AMBULATORY_CARE_PROVIDER_SITE_OTHER): Payer: Medicare Other | Admitting: Family Medicine

## 2020-01-21 ENCOUNTER — Encounter: Payer: Self-pay | Admitting: Family Medicine

## 2020-01-21 ENCOUNTER — Other Ambulatory Visit: Payer: Self-pay

## 2020-01-21 VITALS — BP 150/60 | HR 80 | Temp 97.8°F | Resp 18 | Ht 63.0 in | Wt 132.4 lb

## 2020-01-21 DIAGNOSIS — D649 Anemia, unspecified: Secondary | ICD-10-CM

## 2020-01-21 DIAGNOSIS — Z0000 Encounter for general adult medical examination without abnormal findings: Secondary | ICD-10-CM

## 2020-01-21 DIAGNOSIS — E2839 Other primary ovarian failure: Secondary | ICD-10-CM

## 2020-01-21 DIAGNOSIS — N259 Disorder resulting from impaired renal tubular function, unspecified: Secondary | ICD-10-CM | POA: Diagnosis not present

## 2020-01-21 DIAGNOSIS — M069 Rheumatoid arthritis, unspecified: Secondary | ICD-10-CM

## 2020-01-21 DIAGNOSIS — I739 Peripheral vascular disease, unspecified: Secondary | ICD-10-CM

## 2020-01-21 DIAGNOSIS — Z23 Encounter for immunization: Secondary | ICD-10-CM | POA: Diagnosis not present

## 2020-01-21 DIAGNOSIS — J432 Centrilobular emphysema: Secondary | ICD-10-CM

## 2020-01-21 DIAGNOSIS — I2510 Atherosclerotic heart disease of native coronary artery without angina pectoris: Secondary | ICD-10-CM | POA: Diagnosis not present

## 2020-01-21 DIAGNOSIS — Z1231 Encounter for screening mammogram for malignant neoplasm of breast: Secondary | ICD-10-CM | POA: Diagnosis not present

## 2020-01-21 DIAGNOSIS — I10 Essential (primary) hypertension: Secondary | ICD-10-CM

## 2020-01-21 DIAGNOSIS — E1169 Type 2 diabetes mellitus with other specified complication: Secondary | ICD-10-CM

## 2020-01-21 DIAGNOSIS — D689 Coagulation defect, unspecified: Secondary | ICD-10-CM

## 2020-01-21 DIAGNOSIS — E1165 Type 2 diabetes mellitus with hyperglycemia: Secondary | ICD-10-CM

## 2020-01-21 DIAGNOSIS — M8580 Other specified disorders of bone density and structure, unspecified site: Secondary | ICD-10-CM

## 2020-01-21 DIAGNOSIS — E785 Hyperlipidemia, unspecified: Secondary | ICD-10-CM

## 2020-01-21 LAB — LIPID PANEL
Cholesterol: 152 mg/dL (ref 0–200)
HDL: 57.7 mg/dL (ref >39.00)
LDL Cholesterol: 81 mg/dL (ref 0–99)
NonHDL: 94.67
Total CHOL/HDL Ratio: 3
Triglycerides: 69 mg/dL (ref 0.0–149.0)
VLDL: 13.8 mg/dL (ref 0.0–40.0)

## 2020-01-21 LAB — COMPREHENSIVE METABOLIC PANEL
ALT: 17 U/L (ref 0–35)
AST: 18 U/L (ref 0–37)
Albumin: 3.8 g/dL (ref 3.5–5.2)
Alkaline Phosphatase: 68 U/L (ref 39–117)
BUN: 14 mg/dL (ref 6–23)
CO2: 24 mEq/L (ref 19–32)
Calcium: 8.7 mg/dL (ref 8.4–10.5)
Chloride: 104 mEq/L (ref 96–112)
Creatinine, Ser: 1.11 mg/dL (ref 0.40–1.20)
GFR: 58.66 mL/min — ABNORMAL LOW (ref >60.00)
Glucose, Bld: 96 mg/dL (ref 70–99)
Potassium: 4.1 mEq/L (ref 3.5–5.1)
Sodium: 138 mEq/L (ref 135–145)
Total Bilirubin: 0.3 mg/dL (ref 0.2–1.2)
Total Protein: 6.4 g/dL (ref 6.0–8.3)

## 2020-01-21 LAB — CBC WITH DIFFERENTIAL/PLATELET
Basophils Absolute: 0 10*3/uL (ref 0.0–0.1)
Basophils Relative: 0.4 % (ref 0.0–3.0)
Eosinophils Absolute: 0.2 10*3/uL (ref 0.0–0.7)
Eosinophils Relative: 2.9 % (ref 0.0–5.0)
HCT: 36.3 % (ref 36.0–46.0)
Hemoglobin: 11.9 g/dL — ABNORMAL LOW (ref 12.0–15.0)
Lymphocytes Relative: 30.9 % (ref 12.0–46.0)
Lymphs Abs: 1.7 10*3/uL (ref 0.7–4.0)
MCHC: 32.8 g/dL (ref 30.0–36.0)
MCV: 89.7 fl (ref 78.0–100.0)
Monocytes Absolute: 0.5 10*3/uL (ref 0.1–1.0)
Monocytes Relative: 8.8 % (ref 3.0–12.0)
Neutro Abs: 3.1 10*3/uL (ref 1.4–7.7)
Neutrophils Relative %: 57 % (ref 43.0–77.0)
Platelets: 219 10*3/uL (ref 150.0–400.0)
RBC: 4.05 Mil/uL (ref 3.87–5.11)
RDW: 14.5 % (ref 11.5–15.5)
WBC: 5.5 10*3/uL (ref 4.0–10.5)

## 2020-01-21 LAB — HEMOGLOBIN A1C: Hgb A1c MFr Bld: 6.2 % (ref 4.6–6.5)

## 2020-01-21 MED ORDER — VITAMIN D3 50 MCG (2000 UT) PO CAPS: 2000.0000 [IU] | ORAL_CAPSULE | Freq: Every day | ORAL | 11 refills | Status: DC

## 2020-01-21 MED ORDER — CALCIUM 500-100 MG-UNIT PO CHEW: 1.0000 | CHEWABLE_TABLET | Freq: Every day | ORAL | 1 refills | Status: AC

## 2020-01-21 MED ORDER — METOPROLOL SUCCINATE ER 25 MG PO TB24: 25.0000 mg | ORAL_TABLET | Freq: Every day | ORAL | 1 refills | Status: DC

## 2020-01-21 NOTE — Assessment & Plan Note (Signed)
Encouraged heart healthy diet, increase exercise, avoid trans fats, consider a krill oil cap daily 

## 2020-01-21 NOTE — Patient Instructions (Signed)
Preventive Care 38 Years and Older, Female Preventive care refers to lifestyle choices and visits with your health care provider that can promote health and wellness. This includes:  A yearly physical exam. This is also called an annual well check.  Regular dental and eye exams.  Immunizations.  Screening for certain conditions.  Healthy lifestyle choices, such as diet and exercise. What can I expect for my preventive care visit? Physical exam Your health care provider will check:  Height and weight. These may be used to calculate body mass index (BMI), which is a measurement that tells if you are at a healthy weight.  Heart rate and blood pressure.  Your skin for abnormal spots. Counseling Your health care provider may ask you questions about:  Alcohol, tobacco, and drug use.  Emotional well-being.  Home and relationship well-being.  Sexual activity.  Eating habits.  History of falls.  Memory and ability to understand (cognition).  Work and work Statistician.  Pregnancy and menstrual history. What immunizations do I need?  Influenza (flu) vaccine  This is recommended every year. Tetanus, diphtheria, and pertussis (Tdap) vaccine  You may need a Td booster every 10 years. Varicella (chickenpox) vaccine  You may need this vaccine if you have not already been vaccinated. Zoster (shingles) vaccine  You may need this after age 71. Pneumococcal conjugate (PCV13) vaccine  One dose is recommended after age 71. Pneumococcal polysaccharide (PPSV23) vaccine  One dose is recommended after age 71. Measles, mumps, and rubella (MMR) vaccine  You may need at least one dose of MMR if you were born in 1957 or later. You may also need a second dose. Meningococcal conjugate (MenACWY) vaccine  You may need this if you have certain conditions. Hepatitis A vaccine  You may need this if you have certain conditions or if you travel or work in places where you may be exposed  to hepatitis A. Hepatitis B vaccine  You may need this if you have certain conditions or if you travel or work in places where you may be exposed to hepatitis B. Haemophilus influenzae type b (Hib) vaccine  You may need this if you have certain conditions. You may receive vaccines as individual doses or as more than one vaccine together in one shot (combination vaccines). Talk with your health care provider about the risks and benefits of combination vaccines. What tests do I need? Blood tests  Lipid and cholesterol levels. These may be checked every 5 years, or more frequently depending on your overall health.  Hepatitis C test.  Hepatitis B test. Screening  Lung cancer screening. You may have this screening every year starting at age 71 if you have a 30-pack-year history of smoking and currently smoke or have quit within the past 15 years.  Colorectal cancer screening. All adults should have this screening starting at age 71 and continuing until age 15. Your health care provider may recommend screening at age 23 if you are at increased risk. You will have tests every 1-10 years, depending on your results and the type of screening test.  Diabetes screening. This is done by checking your blood sugar (glucose) after you have not eaten for a while (fasting). You may have this done every 1-3 years.  Mammogram. This may be done every 1-2 years. Talk with your health care provider about how often you should have regular mammograms.  BRCA-related cancer screening. This may be done if you have a family history of breast, ovarian, tubal, or peritoneal cancers.  Other tests  Sexually transmitted disease (STD) testing.  Bone density scan. This is done to screen for osteoporosis. You may have this done starting at age 71. Follow these instructions at home: Eating and drinking  Eat a diet that includes fresh fruits and vegetables, whole grains, lean protein, and low-fat dairy products. Limit  your intake of foods with high amounts of sugar, saturated fats, and salt.  Take vitamin and mineral supplements as recommended by your health care provider.  Do not drink alcohol if your health care provider tells you not to drink.  If you drink alcohol: ? Limit how much you have to 0-1 drink a day. ? Be aware of how much alcohol is in your drink. In the [place] one drink equals one 12 oz bottle of beer (355 mL), one 5 oz glass of wine (148 mL), or one 1 oz glass of hard liquor (44 mL). Lifestyle  Take daily care of your teeth and gums.  Stay active. Exercise for at least 30 minutes on 5 or more days each week.  Do not use any products that contain nicotine or tobacco, such as cigarettes, e-cigarettes, and chewing tobacco. If you need help quitting, ask your health care provider.  If you are sexually active, practice safe sex. Use a condom or other form of protection in order to prevent STIs (sexually transmitted infections).  Talk with your health care provider about taking a low-dose aspirin or statin. What's next?  Go to your health care provider once a year for a well check visit.  Ask your health care provider how often you should have your eyes and teeth checked.  Stay up to date on all vaccines. This information is not intended to replace advice given to you by your health care provider. Make sure you discuss any questions you have with your health care provider. Document Revised: 08/21/2018 Document Reviewed: 08/21/2018 Elsevier Patient Education  2020 [employer].

## 2020-01-21 NOTE — Assessment & Plan Note (Signed)
Stable Per pulm

## 2020-01-21 NOTE — Assessment & Plan Note (Signed)
Per cardiology 

## 2020-01-21 NOTE — Assessment & Plan Note (Signed)
Check labs con't meds 

## 2020-01-21 NOTE — Assessment & Plan Note (Signed)
Check labs 

## 2020-01-21 NOTE — Assessment & Plan Note (Signed)
Well controlled, no changes to meds. Encouraged heart healthy diet such as the DASH diet and exercise as tolerated. --- high today=== pt has not taken meds yet Other ov reviewed from other physicians -- bp has been controlled

## 2020-01-21 NOTE — Assessment & Plan Note (Signed)
Check labs today.

## 2020-01-21 NOTE — Progress Notes (Signed)
Subjective:     Christine Meyer is a 69 y.o. female and is here for a comprehensive physical exam. The patient reports no problems.  HYPERTENSION   Blood pressure range-good per pt   Chest pain- no      Dyspnea- no Lightheadedness- no   Edema- no  Other side effects - no   Medication compliance: good-- has not taken it today  Low salt diet- no    DIABETES    Blood Sugar ranges-good per pt   Polyuria- no New Visual problems- no  Hypoglycemic symptoms- no  Other side effects-no Medication compliance - good Last eye exam- yesterday Foot exam- today    HYPERLIPIDEMIA  Medication compliance- good RUQ pain- no  Muscle aches- no Other side effects-no      Social History   Socioeconomic History  . Marital status: Single    Spouse name: Not on file  . Number of children: 4  . Years of education: Not on file  . Highest education level: Not on file  Occupational History  . 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    Employer: [employer]  Tobacco Use  . Smoking status: Current Every Day Smoker    Packs/day: 0.25    Years: 51.00    Pack years: 12.75    Types: Cigarettes  . Smokeless tobacco: Never Used  Substance and Sexual Activity  . Alcohol use: No    Alcohol/week: 0.0 standard drinks  . Drug use: No  . Sexual activity: Yes  Other Topics Concern  . Not on file  Social History Narrative   Lives at home with grandson.    Daughter [name] is CMA Financial controller primary care [place]   Retired Risk analyst   Smoker, no EtOH/drugs   Social Determinants of Radio broadcast assistant Strain:   . Difficulty of Paying Living Expenses:   Food Insecurity:   . Worried About Charity fundraiser in the Last Year:   . Arboriculturist in the Last Year:   Transportation Needs:   . Film/video editor (Medical):   Marland Kitchen Lack of Transportation (Non-Medical):   Physical Activity:   . Days of Exercise per Week:   . Minutes of  Exercise per Session:   Stress:   . Feeling of Stress :   Social Connections:   . Frequency of Communication with Friends and Family:   . Frequency of Social Gatherings with Friends and Family:   . Attends Religious Services:   . Active Member of Clubs or Organizations:   . Attends Archivist Meetings:   Marland Kitchen Marital Status:   Intimate Partner Violence:   . Fear of Current or Ex-Partner:   . Emotionally Abused:   Marland Kitchen Physically Abused:   . Sexually Abused:    Health Maintenance  Topic Date Due  . TETANUS/TDAP  11/08/2013  . MAMMOGRAM  12/14/2019  . HEMOGLOBIN A1C  01/20/2020  . INFLUENZA VACCINE  04/10/2020  . FOOT EXAM  07/22/2020  . OPHTHALMOLOGY EXAM  01/19/2021  . COLONOSCOPY  02/19/2028  . DEXA SCAN  Completed  . COVID-19 Vaccine  Completed  . Hepatitis C Screening  Completed  . 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 She does not have any pertinent problems on file. She  has a past surgical history that includes Renal artery stent (Right, May 2007; 08/2006); Coronary angioplasty with stent (1999); Colonoscopy (2000); Iliac artery stent (Right, 11/22/2014); Total abdominal hysterectomy (?1980's); lower extremity angiogram (N/A, 11/22/2014); and abdominal angiogram (11/22/2014). Her family history includes Breast cancer in her daughter; Deep vein thrombosis in her mother; Diabetes in her brother, maternal aunt, maternal grandmother, mother, and sister; Emphysema in her  brother; Heart attack in her paternal aunt; Heart failure in her mother; Hypertension in her brother, father, and mother. She  reports that she has been smoking cigarettes. She has a 12.75 pack-year smoking history. She has never used smokeless tobacco. She reports that she does not drink alcohol or use drugs. She has a current medication list which includes the following prescription(s): accu-chek softclix lancets, alendronate, aspirin ec, atorvastatin, onetouch verio, budesonide-formoterol, calcium, vitamin d3, clopidogrel, esbriet, gabapentin, meloxicam, metoprolol succinate, a thru z select 50+ advanced, olmesartan, onetouch verio, pantoprazole, spironolactone, syringe-needle (disp) 3 ml, and tramadol. Current Outpatient Medications on File Prior to Visit  Medication Sig Dispense Refill  . ACCU-CHEK SOFTCLIX LANCETS lancets Check blood sugar once daily. Dx:E11.9 100 each 5  . alendronate (FOSAMAX) 70 MG tablet TAKE 1 TABLET(70 MG) BY MOUTH 1 TIME A WEEK WITH A FULL GLASS OF WATER AND ON AN EMPTY STOMACH 12 tablet 3  . aspirin EC 81 MG tablet Take 1 tablet (81 mg total) by mouth daily. 90 tablet 0  . atorvastatin (LIPITOR) 40 MG tablet TAKE 1 TABLET BY MOUTH  DAILY 90 tablet 3  . Blood Glucose Monitoring Suppl (ONETOUCH VERIO) w/Device KIT Use daily to check blood sugar.   DX E11.9 1 kit 0  . budesonide-formoterol (SYMBICORT) 160-4.5 MCG/ACT inhaler INHALE 2 PUFFS INTO THE LUNGS TWICE DAILY 10.2 g 5  . clopidogrel (PLAVIX) 75 MG tablet TAKE 1 TABLET(75 MG) BY MOUTH DAILY 90 tablet 3  . ESBRIET 801 MG TABS TAKE 1 TABLET (801MG) BY  MOUTH THREE TIMES DAILY  WITH FOOD 90 tablet 0  . gabapentin (NEURONTIN) 100 MG capsule Take 2 capsules (200 mg total) by mouth at bedtime. 60 capsule 0  . meloxicam (MOBIC) 7.5 MG tablet TAKE 1 TABLET(7.5 MG) BY MOUTH AS NEEDED 30 tablet 0  . Multiple Vitamins-Minerals (A THRU Z SELECT 50+ ADVANCED) TABS TAKE 1 TABLET BY MOUTH DAILY 220 tablet 1  . olmesartan (BENICAR)  20 MG tablet Take 1 tablet (20 mg total) by mouth daily. 90 tablet 1  . ONETOUCH VERIO test strip USE TO TEST BLOOD SUGAR ONCE DAILY 75 each 0  . pantoprazole (PROTONIX) 20 MG tablet TAKE 1 TABLET(20 MG) BY MOUTH DAILY BEFORE BREAKFAST 90 tablet 3  . spironolactone (ALDACTONE) 25 MG tablet TAKE 1 TABLET(25 MG) BY MOUTH DAILY 90 tablet 1  . SYRINGE-NEEDLE, DISP, 3 ML (BD INTEGRA SYRINGE) 25G X 1" 3 ML MISC USE TO INJECT B12 ONCE A MONTH 12 each 1  . traMADol (ULTRAM) 50 MG tablet Take 1 tablet (50 mg total) by mouth every 6 (six) hours as needed. 15 tablet 0   No current facility-administered medications on file prior to visit.   She has No Known Allergies..  Review of Systems  Review of Systems  Constitutional: Negative for activity change, appetite change and fatigue.  HENT: Negative for hearing loss, congestion, tinnitus and ear discharge.   Eyes: (see optho q1y -- + cataracts Respiratory: Negative for cough, chest tightness and shortness of breath.   Cardiovascular: Negative for chest pain, palpitations and leg swelling.  Gastrointestinal: Negative for abdominal pain, diarrhea, constipation and abdominal distention.  Genitourinary: Negative for urgency, frequency, decreased urine volume and difficulty urinating.  Musculoskeletal: Negative for back pain, arthralgias and gait problem.  Skin: Negative for color change, pallor and rash.  Neurological: Negative for dizziness, light-headedness, numbness and headaches.  Hematological: Negative for adenopathy. Does not bruise/bleed easily.  Psychiatric/Behavioral: Negative for suicidal ideas, confusion, sleep disturbance, self-injury, dysphoric mood, decreased concentration and agitation.  Pt is able to read and write and can do all ADLs No risk for falling No abuse/ violence in home    Objective:    BP (!) 150/60 (BP Location: Right Arm, Patient Position: Sitting, Cuff Size: Normal)   Pulse 80   Temp 97.8 F (36.6 C) (Temporal)    Resp 18   Ht _0  (1.6 m)   Wt 132 lb 6.4 oz (60.1 kg)   SpO2 99%   BMI 23.45 kg/m  General appearance: alert, cooperative and appears stated age Head: Normocephalic, without obvious abnormality, atraumatic Eyes: negative findings: lids and lashes normal and conjunctivae and sclerae normal Ears: normal TM's and external ear canals both ears Neck: no adenopathy, no carotid bruit, no JVD, supple, symmetrical, trachea midline and thyroid not enlarged, symmetric, no tenderness/mass/nodules Back: negative, symmetric, no curvature. ROM normal. No CVA tenderness. Lungs: clear to auscultation bilaterally Breasts: normal appearance, no masses or tenderness Heart: regular rate and rhythm, S1, S2 normal, no murmur, click, rub or gallop Abdomen: soft, non-tender; bowel sounds normal; no masses,  no organomegaly Pelvic: not indicated; status post hysterectomy, negative ROS Extremities: extremities normal, atraumatic, no cyanosis or edema=+ clubbing  Pulses: 2+ and symmetric Skin: Skin color, texture, turgor normal. No rashes or lesions Lymph nodes: Cervical, supraclavicular, and axillary nodes normal. Neurologic: Alert and oriented X 3, normal strength and tone. Normal symmetric reflexes. Normal coordination and gait    Assessment:    Healthy female exam.      Plan:     See After Visit Summary for Counseling Recommendations    1. Rheumatoid arthritis involving multiple sites, unspecified whether rheumatoid factor present ([place]) Stable    2. Centrilobular emphysema (HCC) Per pulm - Pneumococcal polysaccharide vaccine 23-valent greater than or equal to 2yo subcutaneous/IM  3. Hyperlipidemia associated with type 2 diabetes mellitus ([place]) Encouraged heart healthy diet, increase exercise, avoid trans fats, consider a krill oil cap daily - Lipid panel - Comprehensive metabolic panel  4. Peripheral arterial disease [facility]) Per cardiology   5. Coagulation disorder ([place]) Stable  Check labs    6. Uncontrolled type 2 diabetes mellitus with hyperglycemia ([place]) hgba1c to be checked , minimize simple carbs. Increase exercise as tolerated. Continue current meds  - Hemoglobin A1c - Comprehensive metabolic panel  7. Estrogen deficiency   - DG Bone Density; Future  8. Encounter for screening mammogram for malignant neoplasm of breast   - MM DIGITAL SCREENING BILATERAL; Future  9. Need for pneumococcal vaccination   - Pneumococcal polysaccharide vaccine 23-valent greater than or equal to 2yo subcutaneous/IM  10. Osteopenia, unspecified location   - Cholecalciferol (VITAMIN D3) 50 MCG (2000 UT) capsule; Take 1 capsule (2,000 Units total) by mouth daily.  Dispense: 60 capsule; Refill: 11 - Calcium 500-100 MG-UNIT CHEW; Chew 1 tablet by mouth daily  before breakfast.  Dispense: 150 tablet; Refill: 1  11. Essential hypertension Well controlled, no changes to meds. Encouraged heart healthy diet such as the DASH diet and exercise as tolerated.   Running high today-- pt has not taken meds today - metoprolol succinate (TOPROL-XL) 25 MG 24 hr tablet; Take 1 tablet (25 mg total) by mouth daily.  Dispense: 90 tablet; Refill: 1  12. Normocytic anemia   - CBC with Differential/Platelet   13. Preventative health care See above   14. Disorder resulting from impaired renal function Check labs   15. Atherosclerosis of native coronary artery of native heart without angina pectoris con't meds  Check labs   16. Hyperlipidemia LDL goal <70 Encouraged heart healthy diet, increase exercise, avoid trans fats, consider a krill oil cap daily

## 2020-01-25 ENCOUNTER — Ambulatory Visit (HOSPITAL_BASED_OUTPATIENT_CLINIC_OR_DEPARTMENT_OTHER)
Admission: RE | Admit: 2020-01-25 | Discharge: 2020-01-25 | Disposition: A | Discharge status: Home / Self Care | Payer: Medicare Other | Source: Ambulatory Visit | Attending: Family Medicine | Admitting: Family Medicine

## 2020-01-25 ENCOUNTER — Other Ambulatory Visit: Payer: Self-pay

## 2020-01-25 DIAGNOSIS — Z1231 Encounter for screening mammogram for malignant neoplasm of breast: Secondary | ICD-10-CM

## 2020-01-25 DIAGNOSIS — E2839 Other primary ovarian failure: Secondary | ICD-10-CM | POA: Insufficient documentation

## 2020-01-25 DIAGNOSIS — M85851 Other specified disorders of bone density and structure, right thigh: Secondary | ICD-10-CM | POA: Diagnosis not present

## 2020-01-27 ENCOUNTER — Other Ambulatory Visit: Payer: Self-pay | Admitting: Family Medicine

## 2020-01-27 MED ORDER — ONETOUCH VERIO VI STRP: ORAL_STRIP | 0 refills | Status: DC

## 2020-02-03 ENCOUNTER — Other Ambulatory Visit: Payer: Self-pay

## 2020-02-16 ENCOUNTER — Ambulatory Visit (HOSPITAL_BASED_OUTPATIENT_CLINIC_OR_DEPARTMENT_OTHER)
Admission: RE | Admit: 2020-02-16 | Discharge: 2020-02-16 | Disposition: A | Discharge status: Home / Self Care | Payer: Medicare Other | Source: Ambulatory Visit | Attending: Pulmonary Disease | Admitting: Pulmonary Disease

## 2020-02-16 ENCOUNTER — Other Ambulatory Visit: Payer: Self-pay

## 2020-02-16 DIAGNOSIS — J84112 Idiopathic pulmonary fibrosis: Secondary | ICD-10-CM | POA: Insufficient documentation

## 2020-02-16 DIAGNOSIS — J449 Chronic obstructive pulmonary disease, unspecified: Secondary | ICD-10-CM | POA: Diagnosis not present

## 2020-02-17 ENCOUNTER — Other Ambulatory Visit: Payer: Self-pay | Admitting: Family Medicine

## 2020-02-17 MED ORDER — CYANOCOBALAMIN 1000 MCG/ML IJ SOLN: 1000.0000 ug | INTRAMUSCULAR | 3 refills | Status: DC

## 2020-02-17 NOTE — Telephone Encounter (Signed)
The patients daughter [name]) requested refill of Vitamin B 12 today 02/17/2020. Stated the patient requested refill on Friday 02/12/2020 through the pharmacy and as of today had not received that refill. Refilled today for this patient her Vitamin B 12. Patient and family member aware

## 2020-03-03 ENCOUNTER — Other Ambulatory Visit: Payer: Self-pay | Admitting: Internal Medicine

## 2020-03-06 ENCOUNTER — Other Ambulatory Visit: Payer: Self-pay | Admitting: Family Medicine

## 2020-03-06 DIAGNOSIS — E785 Hyperlipidemia, unspecified: Secondary | ICD-10-CM

## 2020-03-28 DIAGNOSIS — M79672 Pain in left foot: Secondary | ICD-10-CM | POA: Diagnosis not present

## 2020-03-28 DIAGNOSIS — L84 Corns and callosities: Secondary | ICD-10-CM | POA: Diagnosis not present

## 2020-03-28 DIAGNOSIS — B351 Tinea unguium: Secondary | ICD-10-CM | POA: Diagnosis not present

## 2020-03-28 DIAGNOSIS — E1159 Type 2 diabetes mellitus with other circulatory complications: Secondary | ICD-10-CM | POA: Diagnosis not present

## 2020-03-28 DIAGNOSIS — M79671 Pain in right foot: Secondary | ICD-10-CM | POA: Diagnosis not present

## 2020-04-23 ENCOUNTER — Other Ambulatory Visit: Payer: Self-pay | Admitting: Family Medicine

## 2020-04-23 DIAGNOSIS — I10 Essential (primary) hypertension: Secondary | ICD-10-CM

## 2020-04-29 ENCOUNTER — Other Ambulatory Visit: Payer: Self-pay | Admitting: Family Medicine

## 2020-05-24 ENCOUNTER — Other Ambulatory Visit: Payer: Self-pay | Admitting: Family Medicine

## 2020-05-25 ENCOUNTER — Ambulatory Visit: Payer: Medicare Other | Admitting: Internal Medicine

## 2020-06-27 DIAGNOSIS — L84 Corns and callosities: Secondary | ICD-10-CM | POA: Diagnosis not present

## 2020-06-27 DIAGNOSIS — M79671 Pain in right foot: Secondary | ICD-10-CM | POA: Diagnosis not present

## 2020-06-27 DIAGNOSIS — B351 Tinea unguium: Secondary | ICD-10-CM | POA: Diagnosis not present

## 2020-06-27 DIAGNOSIS — M79672 Pain in left foot: Secondary | ICD-10-CM | POA: Diagnosis not present

## 2020-06-27 DIAGNOSIS — E1159 Type 2 diabetes mellitus with other circulatory complications: Secondary | ICD-10-CM | POA: Diagnosis not present

## 2020-06-28 ENCOUNTER — Telehealth: Payer: Self-pay

## 2020-06-28 NOTE — Telephone Encounter (Signed)
Ok to put in Hem Band spot, per Dr. Carlean Purl. Called patient & scheduled earlier O.V. on 07/14/20

## 2020-06-30 ENCOUNTER — Encounter: Payer: Self-pay | Admitting: Internal Medicine

## 2020-06-30 ENCOUNTER — Other Ambulatory Visit: Payer: Self-pay

## 2020-06-30 ENCOUNTER — Ambulatory Visit: Payer: Medicare Other | Admitting: Pulmonary Disease

## 2020-06-30 ENCOUNTER — Ambulatory Visit (INDEPENDENT_AMBULATORY_CARE_PROVIDER_SITE_OTHER): Payer: Medicare Other | Admitting: Internal Medicine

## 2020-06-30 ENCOUNTER — Ambulatory Visit (INDEPENDENT_AMBULATORY_CARE_PROVIDER_SITE_OTHER): Payer: Medicare Other

## 2020-06-30 VITALS — BP 162/88 | HR 102 | Temp 98.7°F | Ht 63.0 in | Wt 122.0 lb

## 2020-06-30 DIAGNOSIS — J208 Acute bronchitis due to other specified organisms: Secondary | ICD-10-CM | POA: Diagnosis not present

## 2020-06-30 DIAGNOSIS — J441 Chronic obstructive pulmonary disease with (acute) exacerbation: Secondary | ICD-10-CM | POA: Insufficient documentation

## 2020-06-30 DIAGNOSIS — R059 Cough, unspecified: Secondary | ICD-10-CM | POA: Diagnosis not present

## 2020-06-30 DIAGNOSIS — B9689 Other specified bacterial agents as the cause of diseases classified elsewhere: Secondary | ICD-10-CM | POA: Diagnosis not present

## 2020-06-30 MED ORDER — DOXYCYCLINE HYCLATE 100 MG PO TABS: 100.0000 mg | ORAL_TABLET | Freq: Two times a day (BID) | ORAL | 0 refills | Status: DC

## 2020-06-30 MED ORDER — PREDNISONE 10 MG PO TABS: ORAL_TABLET | ORAL | 0 refills | Status: DC

## 2020-06-30 MED ORDER — BENZONATATE 200 MG PO CAPS: 200.0000 mg | ORAL_CAPSULE | Freq: Three times a day (TID) | ORAL | 0 refills | Status: DC | PRN

## 2020-06-30 NOTE — Patient Instructions (Signed)
Have a chest xray today downstairs.    Take all the prescribed medication.
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
## 2020-07-07 ENCOUNTER — Encounter: Payer: Self-pay | Admitting: Family Medicine

## 2020-07-07 ENCOUNTER — Other Ambulatory Visit: Payer: Self-pay

## 2020-07-07 ENCOUNTER — Ambulatory Visit (INDEPENDENT_AMBULATORY_CARE_PROVIDER_SITE_OTHER): Payer: Medicare Other | Admitting: Family Medicine

## 2020-07-07 VITALS — BP 148/70 | HR 72 | Temp 97.8°F | Resp 18 | Ht 63.0 in | Wt 126.4 lb

## 2020-07-07 DIAGNOSIS — E1169 Type 2 diabetes mellitus with other specified complication: Secondary | ICD-10-CM | POA: Diagnosis not present

## 2020-07-07 DIAGNOSIS — R630 Anorexia: Secondary | ICD-10-CM

## 2020-07-07 DIAGNOSIS — E1165 Type 2 diabetes mellitus with hyperglycemia: Secondary | ICD-10-CM

## 2020-07-07 DIAGNOSIS — J40 Bronchitis, not specified as acute or chronic: Secondary | ICD-10-CM

## 2020-07-07 DIAGNOSIS — Z23 Encounter for immunization: Secondary | ICD-10-CM | POA: Diagnosis not present

## 2020-07-07 DIAGNOSIS — E785 Hyperlipidemia, unspecified: Secondary | ICD-10-CM | POA: Diagnosis not present

## 2020-07-07 DIAGNOSIS — I10 Essential (primary) hypertension: Secondary | ICD-10-CM

## 2020-07-07 DIAGNOSIS — E1151 Type 2 diabetes mellitus with diabetic peripheral angiopathy without gangrene: Secondary | ICD-10-CM

## 2020-07-07 DIAGNOSIS — M545 Low back pain, unspecified: Secondary | ICD-10-CM | POA: Diagnosis not present

## 2020-07-07 MED ORDER — TIZANIDINE HCL 4 MG PO TABS: 4.0000 mg | ORAL_TABLET | Freq: Four times a day (QID) | ORAL | 0 refills | Status: DC | PRN

## 2020-07-07 MED ORDER — MELOXICAM 7.5 MG PO TABS: ORAL_TABLET | ORAL | 0 refills | Status: DC

## 2020-07-07 MED ORDER — MIRTAZAPINE 15 MG PO TABS: 15.0000 mg | ORAL_TABLET | Freq: Every day | ORAL | 2 refills | Status: DC
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
## 2020-07-08 LAB — CBC WITH DIFFERENTIAL/PLATELET
Absolute Monocytes: 620 cells/uL (ref 200–950)
Basophils Absolute: 21 cells/uL (ref 0–200)
Basophils Relative: 0.2 %
Eosinophils Absolute: 95 cells/uL (ref 15–500)
Eosinophils Relative: 0.9 %
HCT: 36.2 % (ref 35.0–45.0)
Hemoglobin: 11.9 g/dL (ref 11.7–15.5)
Lymphs Abs: 3875 cells/uL (ref 850–3900)
MCH: 29.1 pg (ref 27.0–33.0)
MCHC: 32.9 g/dL (ref 32.0–36.0)
MCV: 88.5 fL (ref 80.0–100.0)
MPV: 9.1 fL (ref 7.5–12.5)
Monocytes Relative: 5.9 %
Neutro Abs: 5891 cells/uL (ref 1500–7800)
Neutrophils Relative %: 56.1 %
Platelets: 296 10*3/uL (ref 140–400)
RBC: 4.09 10*6/uL (ref 3.80–5.10)
RDW: 13.2 % (ref 11.0–15.0)
Total Lymphocyte: 36.9 %
WBC: 10.5 10*3/uL (ref 3.8–10.8)

## 2020-07-08 LAB — LIPID PANEL
Cholesterol: 141 mg/dL (ref <200)
HDL: 56 mg/dL (ref >=50)
LDL Cholesterol (Calc): 66 mg/dL (calc)
Non-HDL Cholesterol (Calc): 85 mg/dL (calc) (ref <130)
Total CHOL/HDL Ratio: 2.5 (calc) (ref <5.0)
Triglycerides: 103 mg/dL (ref <150)

## 2020-07-08 LAB — COMPREHENSIVE METABOLIC PANEL
AG Ratio: 1.5 (calc) (ref 1.0–2.5)
ALT: 40 U/L — ABNORMAL HIGH (ref 6–29)
AST: 21 U/L (ref 10–35)
Albumin: 3.7 g/dL (ref 3.6–5.1)
Alkaline phosphatase (APISO): 60 U/L (ref 37–153)
BUN/Creatinine Ratio: 16 (calc) (ref 6–22)
BUN: 17 mg/dL (ref 7–25)
CO2: 27 mmol/L (ref 20–32)
Calcium: 9.5 mg/dL (ref 8.6–10.4)
Chloride: 101 mmol/L (ref 98–110)
Creat: 1.06 mg/dL — ABNORMAL HIGH (ref 0.60–0.93)
Globulin: 2.5 g/dL (calc) (ref 1.9–3.7)
Glucose, Bld: 94 mg/dL (ref 65–99)
Potassium: 4 mmol/L (ref 3.5–5.3)
Sodium: 139 mmol/L (ref 135–146)
Total Bilirubin: 0.5 mg/dL (ref 0.2–1.2)
Total Protein: 6.2 g/dL (ref 6.1–8.1)

## 2020-07-08 LAB — HEMOGLOBIN A1C
Hgb A1c MFr Bld: 6 % of total Hgb — ABNORMAL HIGH (ref <5.7)
Mean Plasma Glucose: 126 (calc)
eAG (mmol/L): 7 (calc)

## 2020-07-10 DIAGNOSIS — J40 Bronchitis, not specified as acute or chronic: Secondary | ICD-10-CM | POA: Insufficient documentation

## 2020-07-10 NOTE — Assessment & Plan Note (Signed)
hgba1c to be checked, minimize simple carbs. Increase exercise as tolerated. Continue current meds  

## 2020-07-10 NOTE — Assessment & Plan Note (Signed)
Well controlled, no changes to meds. Encouraged heart healthy diet such as the DASH diet and exercise as tolerated.  con't benicar and spironolactone

## 2020-07-10 NOTE — Assessment & Plan Note (Signed)
Finish doxy and pred F/u prn Pt feels like she is getting better

## 2020-07-11 ENCOUNTER — Other Ambulatory Visit: Payer: Self-pay | Admitting: Family Medicine

## 2020-07-11 DIAGNOSIS — M545 Low back pain, unspecified: Secondary | ICD-10-CM

## 2020-07-12 ENCOUNTER — Telehealth: Payer: Self-pay | Admitting: *Deleted

## 2020-07-12 NOTE — Telephone Encounter (Signed)
Patient notified

## 2020-07-12 NOTE — Telephone Encounter (Signed)
Pt called. LVM to return call to advise below recommendation.

## 2020-07-12 NOTE — Telephone Encounter (Signed)
Patient stated that she was not sleeping good a night and wanted to know if there is a pill she can take?

## 2020-07-12 NOTE — Telephone Encounter (Signed)
Remeron should eventually help with that --- in another couple of weeks we can increase the dose  She can try melatonin otc

## 2020-07-14 ENCOUNTER — Telehealth: Payer: Self-pay

## 2020-07-14 ENCOUNTER — Ambulatory Visit (INDEPENDENT_AMBULATORY_CARE_PROVIDER_SITE_OTHER): Payer: Medicare Other | Admitting: Internal Medicine

## 2020-07-14 ENCOUNTER — Encounter: Payer: Self-pay | Admitting: Internal Medicine

## 2020-07-14 VITALS — BP 134/60 | HR 58 | Ht 63.0 in | Wt 122.2 lb

## 2020-07-14 DIAGNOSIS — R630 Anorexia: Secondary | ICD-10-CM

## 2020-07-14 DIAGNOSIS — R634 Abnormal weight loss: Secondary | ICD-10-CM

## 2020-07-14 DIAGNOSIS — Z7902 Long term (current) use of antithrombotics/antiplatelets: Secondary | ICD-10-CM

## 2020-07-14 DIAGNOSIS — R1033 Periumbilical pain: Secondary | ICD-10-CM

## 2020-07-14 DIAGNOSIS — K317 Polyp of stomach and duodenum: Secondary | ICD-10-CM | POA: Diagnosis not present

## 2020-07-14 MED ORDER — MIRTAZAPINE 15 MG PO TABS: 15.0000 mg | ORAL_TABLET | Freq: Every day | ORAL | 2 refills | Status: DC

## 2020-07-14 NOTE — Telephone Encounter (Signed)
[facility] Pre-operative Risk Assessment     Request for surgical clearance:     Endoscopy Procedure  What type of surgery is being performed?     EGD  When is this surgery scheduled?     08/09/2020  What type of clearance is required ?   Pharmacy  Are there any medications that need to be held prior to surgery and how long? Plavix, 5 days  Practice name and name of physician performing surgery?      [facility]  What is your office phone and fax number?      Phone- [phone]  [phone]  Anesthesia type (None, local, MAC, general) ?       MAC

## 2020-07-14 NOTE — Patient Instructions (Addendum)
Normal BMI (Body Mass Index- based on height and weight) is between 23 and 30. Your BMI today is Body mass index is 21.66 kg/m. Marland Kitchen Please consider follow up  regarding your BMI with your Primary Care Provider.   You will be contacted by our office prior to your procedure for directions on holding your Plavix.  If you do not hear from our office 1 week prior to your scheduled procedure, please call [phone] to discuss.    You have been scheduled for an endoscopy. Please follow written instructions given to you at your visit today. If you use inhalers (even only as needed), please bring them with you on the day of your procedure.  Start your Mirtazapine as directed.   Due to recent changes in healthcare laws, you may see the results of your imaging and laboratory studies on MyChart before your provider has had a chance to review them.  We understand that in some cases there may be results that are confusing or concerning to you. Not all laboratory results come back in the same time frame and the provider may be waiting for multiple results in order to interpret others.  Please give [place] 48 hours in order for your provider to thoroughly review all the results before contacting the office for clarification of your results.    I appreciate the opportunity to care for you. Silvano Rusk, MD, [facility]

## 2020-07-14 NOTE — Telephone Encounter (Signed)
Left message for the patient to call back and speak to the on call preop APP of the day.  Dr. Gwenlyn Found, please review, Mrs. Strawderman is a 69 yo female with PMH of CAD, PAD, carotid artery disease, HTN, and HLD. Last PCI was in 1999. Myoview in 2016 was low risk. She had previous SFA stents in 2016. As long as she is chest pain free, would you be ok with him holding plavix for 5 days prior to low risk colonoscopy?  Please send your response to P [facility] PREOP

## 2020-07-14 NOTE — Progress Notes (Signed)
Christine Meyer 69 y.o. 1951-04-29 [id number]  Assessment & Plan:   Encounter Diagnoses  Name Primary?  . Periumbilical abdominal pain Yes  . Gastric polyps - hyperplastic   . Loss of weight   . Long term current use of antithrombotics/antiplatelets - clopidogrel   . Decreased appetite     Differential diagnosis here is the development of a gastric malignancy perhaps, unlikely but possible.  Mesenteric ischemia possible but she has lost her appetite more than having sitophobia.  Anorexia insomnia make me wonder about depression.  Agree with mirtazapine.  We talked about potential sedation so she will start with a half a tablet.  However that is an excellent drug for her situation.  Need to press on to rule out other causes and if EGD is unrevealing CT abdomen pelvis would be my next step.  She has a history of uterine cancer and though unlikely to be recurrent that is always possible and other cancers are in the differential here as well.  Given the possibility of needing to snare polyps I would like to withhold Plavix for 5 days prior to the procedure we will clarify with cardiology.  Rare but real extra/increased risk of endoscopy in this setting of possible coronary event explained to the patient and her daughter.  Other usual risks benefits indications of upper GI endoscopy have been explained and reviewed as well.  I appreciate the opportunity to care for this patient. CC: Christine Held, DO   Subjective:   Chief Complaint:  HPI The patient is a 69 year old African-American woman here because of epigastric and periumbilical pain and weight loss in the setting of hyperplastic gastric polyp history and autoimmune gastritis.  She had had an endoscopy showing the polyps and gastritis in June of last year and was recommended for a 1 year repeat.  She recently saw her primary care where she was complaining of insomnia bilateral abdominal pain and epigastric pain mostly a  periumbilical location that comes and goes with or without eating.  Bowel movements are regular.  They do not seem to be associated with these pains.  She had a colonoscopy in 2019 and a diminutive distal hyperplastic polyp was removed and she had internal hemorrhoids as well.  EGD then showed the same as in 2020 with the hyperplastic gastric polyps.  Sometime in the past few months she developed these bilateral periumbilical pains she is not eating and has lost weight.  Dr. Etter Meyer prescribed mirtazapine 15 mg at bedtime but the patient wanted to ask me if this was also appropriate.  Patient's daughter Christine Meyer, CMA is with her for the entire visit today.  She participates in the history as well.  Wt Readings from Last 3 Encounters:  07/14/20 122 lb 4 oz (55.5 kg)  07/07/20 126 lb 6.4 oz (57.3 kg)  06/30/20 122 lb (55.3 kg)   She was 135 pounds in November 2020  October 28 labs reviewed Creatinine slightly high ALT slightly high 1.06 and 40, respectively.  Hemoglobin 11.9 normal white count at 10.5 platelets normal A1c 6% No Known Allergies  CT chest May 2021 IMPRESSION: 1. Spectrum of findings compatible with basilar predominant fibrotic interstitial lung disease with mild-to-moderate honeycombing, progressed since baseline 08/24/2014 chest CT angiogram study, without appreciable interval progression since 05/09/2018 chest CT. Findings are consistent with UIP per consensus guidelines: Diagnosis of Idiopathic Pulmonary Fibrosis: An Official ATS/ERS/JRS/ALAT Clinical Practice Guideline. [place] 198, Iss 5, [id number], May 11 2017. 2. Stable mild mediastinal lymphadenopathy, most compatible with benign reactive adenopathy. 3. Three-vessel coronary atherosclerosis. 4. Aortic Atherosclerosis (ICD10-I70.0) and Emphysema (ICD10-J43.9). Current Meds  Medication Sig  . ACCU-CHEK SOFTCLIX LANCETS lancets Check blood sugar once daily. Dx:E11.9  . alendronate (FOSAMAX) 70 MG  tablet TAKE 1 TABLET(70 MG) BY MOUTH 1 TIME A WEEK WITH A FULL GLASS OF WATER AND ON AN EMPTY STOMACH  . aspirin EC 81 MG tablet Take 1 tablet (81 mg total) by mouth daily.  Marland Kitchen atorvastatin (LIPITOR) 40 MG tablet TAKE 1 TABLET BY MOUTH  DAILY  . Blood Glucose Monitoring Suppl (ONETOUCH VERIO) w/Device KIT Use daily to check blood sugar.   DX E11.9  . budesonide-formoterol (SYMBICORT) 160-4.5 MCG/ACT inhaler INHALE 2 PUFFS INTO THE LUNGS TWICE DAILY  . Calcium 500-100 MG-UNIT CHEW Chew 1 tablet by mouth daily before breakfast.  . Cholecalciferol (VITAMIN D3) 50 MCG (2000 UT) capsule Take 1 capsule (2,000 Units total) by mouth daily.  . clopidogrel (PLAVIX) 75 MG tablet TAKE 1 TABLET(75 MG) BY MOUTH DAILY  . cyanocobalamin (,VITAMIN B-12,) 1000 MCG/ML injection Inject 1 mL (1,000 mcg total) into the muscle every 30 (thirty) days.  . ESBRIET 801 MG TABS TAKE 1 TABLET (801MG) BY  MOUTH THREE TIMES DAILY  WITH FOOD  . glucose blood (ONETOUCH VERIO) test strip USE AS DIRECTED DAILY  . meloxicam (MOBIC) 7.5 MG tablet TAKE 1 TABLET(7.5 MG) BY MOUTH AS NEEDED  . metoprolol succinate (TOPROL-XL) 25 MG 24 hr tablet Take 1 tablet (25 mg total) by mouth daily.  . mirtazapine (REMERON) 15 MG tablet Take 1 tablet (15 mg total) by mouth at bedtime.  . Multiple Vitamins-Minerals (A THRU Z SELECT 50+ ADVANCED) TABS TAKE 1 TABLET BY MOUTH DAILY  . olmesartan (BENICAR) 20 MG tablet TAKE 1 TABLET(20 MG) BY MOUTH DAILY  . pantoprazole (PROTONIX) 20 MG tablet TAKE 1 TABLET(20 MG) BY MOUTH DAILY BEFORE BREAKFAST  . spironolactone (ALDACTONE) 25 MG tablet TAKE 1 TABLET(25 MG) BY MOUTH DAILY  . SYRINGE-NEEDLE, DISP, 3 ML (BD INTEGRA SYRINGE) 25G X 1" 3 ML MISC USE TO INJECT B12 ONCE A MONTH  . tiZANidine (ZANAFLEX) 4 MG capsule Take 4 mg by mouth 3 (three) times daily.   Past Medical History:  Diagnosis Date  . Anemia    NOS  . 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 CAD (coronary artery disease)      Stent RCA 1999  . COPD (chronic obstructive pulmonary disease) ([place])   . Diabetes mellitus, type 2 (HCC)    Diet controlled  . Gastric polyps - hyperplastic 02/23/2018  . Hyperlipidemia   . Hypertension   . OSA (obstructive sleep apnea) [name]   . PVD (peripheral vascular disease) (HCC)    Carotid stenosis, renal artery stenosis  . Sleep apnea    dx'd but doesn't use mask   . Uterine cancer ([place]) ?8101'B   Past Surgical History:  Procedure Laterality Date  . ABDOMINAL ANGIOGRAM  11/22/2014   Procedure: ABDOMINAL ANGIOGRAM;  Surgeon: Lorretta Harp, MD;  Location: [facility] CATH LAB;  Service: Cardiovascular;;  . COLONOSCOPY  2000   negative  . CORONARY ANGIOPLASTY WITH STENT PLACEMENT  1999   RCA; Dr. Melvern Banker G4 P4  . ESOPHAGOGASTRODUODENOSCOPY    . ILIAC ARTERY STENT Right 11/22/2014  . LOWER EXTREMITY ANGIOGRAM N/A 11/22/2014   Procedure: LOWER EXTREMITY ANGIOGRAM;  Surgeon: Lorretta Harp, MD; L-SFA 100%, 3v runoff, R-pCIA 95>>0% w/ 8 mm x 18  mm long Balloon expandable stent; R-dCIA 67>>0% w/ 8 mm x 4 cm long Cordis Smart Nitinol self expanding stent   . RENAL ARTERY STENT Right May 2007; 08/2006   ; restenosis  . TOTAL ABDOMINAL HYSTERECTOMY  ?1980's    for uterine CA   Social History   Social History Narrative   Lives at home with grandson.    Daughter [name] is CMA Financial controller primary care [place]   Retired Risk analyst   Smoker, no EtOH/drugs   family history includes Breast cancer in her daughter; Deep vein thrombosis in her mother; Diabetes in her brother, maternal aunt, maternal grandmother, mother, and sister; Emphysema in her brother; Heart attack in her paternal aunt; Heart failure in her mother; Hypertension in her brother, father, and mother.   Review of Systems As per HPI  Objective:   Physical Exam _0  134/60   Pulse (!) 58   Ht 5' 3" (1.6 m)   Wt 122 lb 4 oz (55.5 kg)   BMI 21.66 kg/m @  General:  NAD Eyes:   anicteric Lungs:   clear Heart::  S1S2 no rubs, murmurs or gallops Abdomen:  soft and nontender, BS+ skin a little loose Ext:   no edema, cyanosis or clubbing    Data Reviewed:  See HPI But this includes prior upper endoscopy and images, review of recent primary care notes, labs and CT chest images.

## 2020-07-14 NOTE — Telephone Encounter (Signed)
I spoke with Christine Meyer, she denies any recent exertional chest pain or shortness of breath. She denies any leg pain, but mentions her feet feels cold at night, however denies any pain or loss of sensation in her feed. Previous ABI obtained in Dec 2020 showed L ABI 0.41, R ABI 0.48. She is due to repeat carotid doppler and ABI in Dec 2021.   Will await Dr. Kennon Holter recommendation.

## 2020-07-16 NOTE — Telephone Encounter (Signed)
OK to hold plavix for EGD

## 2020-07-18 NOTE — Telephone Encounter (Signed)
Patient's daughter [name] informed about Mom holding her Plavix x 5 days and she verbalized understanding and will pass this on to Mom.

## 2020-07-18 NOTE — Telephone Encounter (Signed)
   Primary Cardiologist: Quay Burow, MD  Chart reviewed as part of pre-operative protocol coverage.   Per Dr. Gwenlyn Found, patient can hold plavix 5 days prior to her upcoming EGD with plans to restart plavix when cleared to do so by her gastroenterologist.   I will route this recommendation to the requesting party via [place] fax function and remove from pre-op pool.  Please call with questions.  Abigail Butts, PA-C 07/18/2020, 12:50 PM

## 2020-07-19 ENCOUNTER — Telehealth: Payer: Self-pay

## 2020-07-19 ENCOUNTER — Ambulatory Visit (HOSPITAL_BASED_OUTPATIENT_CLINIC_OR_DEPARTMENT_OTHER)
Admission: RE | Admit: 2020-07-19 | Discharge: 2020-07-19 | Disposition: A | Discharge status: Home / Self Care | Payer: Medicare Other | Source: Ambulatory Visit | Attending: Internal Medicine | Admitting: Internal Medicine

## 2020-07-19 ENCOUNTER — Other Ambulatory Visit: Payer: Self-pay

## 2020-07-19 DIAGNOSIS — R109 Unspecified abdominal pain: Secondary | ICD-10-CM | POA: Diagnosis not present

## 2020-07-19 DIAGNOSIS — R112 Nausea with vomiting, unspecified: Secondary | ICD-10-CM

## 2020-07-19 DIAGNOSIS — R1033 Periumbilical pain: Secondary | ICD-10-CM

## 2020-07-19 DIAGNOSIS — R1110 Vomiting, unspecified: Secondary | ICD-10-CM | POA: Diagnosis not present

## 2020-07-19 MED ORDER — IOHEXOL 300 MG/ML  SOLN
100.0000 mL | Freq: Once | INTRAMUSCULAR | Status: AC | PRN
  Administered 2020-07-19: 100 mL via INTRAVENOUS

## 2020-07-19 NOTE — Telephone Encounter (Signed)
Pt daughter returning your call

## 2020-07-19 NOTE — Telephone Encounter (Signed)
Christine Meyer called back and she is going to take Mom this pm for the CT. I left her a message to come by here for the contrast or show up at 3pm at [facility] to drink there.

## 2020-07-21 NOTE — Telephone Encounter (Signed)
I called and explained she may have mesenteric ischemia  I need to go over films of CT with radiology and will call back  Liquid diet for now  Also updated daughter [name]

## 2020-07-22 ENCOUNTER — Ambulatory Visit: Payer: Medicare Other | Admitting: Internal Medicine

## 2020-07-22 ENCOUNTER — Other Ambulatory Visit (HOSPITAL_COMMUNITY): Payer: Self-pay | Admitting: Cardiovascular Disease

## 2020-07-22 DIAGNOSIS — I739 Peripheral vascular disease, unspecified: Secondary | ICD-10-CM

## 2020-07-22 DIAGNOSIS — Z95828 Presence of other vascular implants and grafts: Secondary | ICD-10-CM

## 2020-07-24 ENCOUNTER — Other Ambulatory Visit: Payer: Self-pay | Admitting: Family Medicine

## 2020-07-24 DIAGNOSIS — M545 Low back pain, unspecified: Secondary | ICD-10-CM

## 2020-07-25 ENCOUNTER — Ambulatory Visit: Payer: Medicare Other | Admitting: Family Medicine

## 2020-07-25 MED ORDER — TIZANIDINE HCL 4 MG PO CAPS: 4.0000 mg | ORAL_CAPSULE | Freq: Three times a day (TID) | ORAL | 0 refills | Status: DC | PRN

## 2020-08-04 ENCOUNTER — Other Ambulatory Visit: Payer: Self-pay | Admitting: Family Medicine

## 2020-08-04 DIAGNOSIS — M545 Low back pain, unspecified: Secondary | ICD-10-CM

## 2020-08-08 ENCOUNTER — Ambulatory Visit (INDEPENDENT_AMBULATORY_CARE_PROVIDER_SITE_OTHER): Payer: Medicare Other | Admitting: Pulmonary Disease

## 2020-08-08 ENCOUNTER — Other Ambulatory Visit: Payer: Self-pay

## 2020-08-08 ENCOUNTER — Encounter: Payer: Self-pay | Admitting: Pulmonary Disease

## 2020-08-08 VITALS — BP 130/64 | HR 82 | Ht 63.0 in | Wt 116.6 lb

## 2020-08-08 DIAGNOSIS — J849 Interstitial pulmonary disease, unspecified: Secondary | ICD-10-CM | POA: Diagnosis not present

## 2020-08-08 DIAGNOSIS — Z5181 Encounter for therapeutic drug level monitoring: Secondary | ICD-10-CM | POA: Diagnosis not present

## 2020-08-08 DIAGNOSIS — J449 Chronic obstructive pulmonary disease, unspecified: Secondary | ICD-10-CM

## 2020-08-08 LAB — CBC WITH DIFFERENTIAL/PLATELET
Basophils Absolute: 0 10*3/uL (ref 0.0–0.1)
Basophils Relative: 0.4 % (ref 0.0–3.0)
Eosinophils Absolute: 0.2 10*3/uL (ref 0.0–0.7)
Eosinophils Relative: 2.6 % (ref 0.0–5.0)
HCT: 36 % (ref 36.0–46.0)
Hemoglobin: 11.9 g/dL — ABNORMAL LOW (ref 12.0–15.0)
Lymphocytes Relative: 34.3 % (ref 12.0–46.0)
Lymphs Abs: 2.1 10*3/uL (ref 0.7–4.0)
MCHC: 33 g/dL (ref 30.0–36.0)
MCV: 87.3 fl (ref 78.0–100.0)
Monocytes Absolute: 0.6 10*3/uL (ref 0.1–1.0)
Monocytes Relative: 9.5 % (ref 3.0–12.0)
Neutro Abs: 3.2 10*3/uL (ref 1.4–7.7)
Neutrophils Relative %: 53.2 % (ref 43.0–77.0)
Platelets: 224 10*3/uL (ref 150.0–400.0)
RBC: 4.12 Mil/uL (ref 3.87–5.11)
RDW: 14.6 % (ref 11.5–15.5)
WBC: 6 10*3/uL (ref 4.0–10.5)

## 2020-08-08 LAB — COMPREHENSIVE METABOLIC PANEL
ALT: 18 U/L (ref 0–35)
AST: 21 U/L (ref 0–37)
Albumin: 4 g/dL (ref 3.5–5.2)
Alkaline Phosphatase: 52 U/L (ref 39–117)
BUN: 10 mg/dL (ref 6–23)
CO2: 28 mEq/L (ref 19–32)
Calcium: 9.9 mg/dL (ref 8.4–10.5)
Chloride: 107 mEq/L (ref 96–112)
Creatinine, Ser: 1.12 mg/dL (ref 0.40–1.20)
GFR: 49.52 mL/min — ABNORMAL LOW (ref >60.00)
Glucose, Bld: 92 mg/dL (ref 70–99)
Potassium: 4.5 mEq/L (ref 3.5–5.1)
Sodium: 143 mEq/L (ref 135–145)
Total Bilirubin: 0.4 mg/dL (ref 0.2–1.2)
Total Protein: 6.9 g/dL (ref 6.0–8.3)

## 2020-08-08 MED ORDER — STIOLTO RESPIMAT 2.5-2.5 MCG/ACT IN AERS: 2.0000 | INHALATION_SPRAY | Freq: Every day | RESPIRATORY_TRACT | 5 refills | Status: DC

## 2020-08-08 NOTE — Progress Notes (Signed)
Christine Meyer    [id number]    06-17-1951  Primary Care Physician:Lowne Cheri Rous Alferd Apa, DO  Referring Physician: Carollee Herter, Alferd Apa, DO [address] [place],  [place] [address]  Chief complaint: Follow-up for COPD, idiopathic pulmonary fibrosis  HPI: Christine Meyer was diagnosed with IPF in 2016 after CT scan showing UIP pulmonary fibrosis and centrilobular emphysema.  She has significant smoking history.  Evaluated by rheumatology in 2016 by Dr. Trudie Reed for mildly elevated rheumatoid factor with no evidence of connective tissue disease She is started on Esbriet in 2016 and has tolerated it well with no issues  Pets: Dogs, no cats, birds Occupation: Retired Training and development officer Exposures: No known exposures.  No mold, hot tub, Jacuzzi, no feather pillows or comforter Smoking history: 13-pack-year smoker.  Continues to smoke every day Travel history: No significant travel history Relevant family history: No significant family history of lung disease  Interim history: Complains of mild increase in dyspnea on exertion.  She was treated for bronchitis with prednisone by primary care a month ago Complains of chronic cough with yellow mucus, no wheezing, fevers, chills.  She is taking Esbriet only twice daily since she feels that 3 times a day is too high though she is tolerating it well She is due to get an EGD soon by Dr. Carlean Purl for evaluation of periumbilical pain  Outpatient Encounter Medications as of 08/08/2020  Medication Sig  . ACCU-CHEK SOFTCLIX LANCETS lancets Check blood sugar once daily. Dx:E11.9  . alendronate (FOSAMAX) 70 MG tablet TAKE 1 TABLET(70 MG) BY MOUTH 1 TIME A WEEK WITH A FULL GLASS OF WATER AND ON AN EMPTY STOMACH  . aspirin EC 81 MG tablet Take 1 tablet (81 mg total) by mouth daily.  Marland Kitchen atorvastatin (LIPITOR) 40 MG tablet TAKE 1 TABLET BY MOUTH  DAILY  . Blood Glucose Monitoring Suppl (ONETOUCH VERIO) w/Device KIT Use daily to check blood sugar.   DX  E11.9  . budesonide-formoterol (SYMBICORT) 160-4.5 MCG/ACT inhaler INHALE 2 PUFFS INTO THE LUNGS TWICE DAILY  . Calcium 500-100 MG-UNIT CHEW Chew 1 tablet by mouth daily before breakfast.  . Cholecalciferol (VITAMIN D3) 50 MCG (2000 UT) capsule Take 1 capsule (2,000 Units total) by mouth daily.  . cyanocobalamin (,VITAMIN B-12,) 1000 MCG/ML injection Inject 1 mL (1,000 mcg total) into the muscle every 30 (thirty) days.  . ESBRIET 801 MG TABS TAKE 1 TABLET (801MG) BY  MOUTH THREE TIMES DAILY  WITH FOOD  . glucose blood (ONETOUCH VERIO) test strip USE AS DIRECTED DAILY  . meloxicam (MOBIC) 7.5 MG tablet TAKE 1 TABLET BY MOUTH AS NEEDED AS DIRECTED  . metoprolol succinate (TOPROL-XL) 25 MG 24 hr tablet Take 1 tablet (25 mg total) by mouth daily.  . Multiple Vitamins-Minerals (A THRU Z SELECT 50+ ADVANCED) TABS TAKE 1 TABLET BY MOUTH DAILY  . olmesartan (BENICAR) 20 MG tablet TAKE 1 TABLET(20 MG) BY MOUTH DAILY  . pantoprazole (PROTONIX) 20 MG tablet TAKE 1 TABLET(20 MG) BY MOUTH DAILY BEFORE BREAKFAST  . spironolactone (ALDACTONE) 25 MG tablet TAKE 1 TABLET(25 MG) BY MOUTH DAILY  . SYRINGE-NEEDLE, DISP, 3 ML (BD INTEGRA SYRINGE) 25G X 1" 3 ML MISC USE TO INJECT B12 ONCE A MONTH  . clopidogrel (PLAVIX) 75 MG tablet TAKE 1 TABLET(75 MG) BY MOUTH DAILY (Patient not taking: Reported on 08/08/2020)  . mirtazapine (REMERON) 15 MG tablet Take 1 tablet (15 mg total) by mouth at bedtime. Start 1/2  tablet first week (Patient not taking: Reported on 08/08/2020)  . tiZANidine (ZANAFLEX) 4 MG capsule Take 1 capsule (4 mg total) by mouth 3 (three) times daily as needed for muscle spasms. (Patient not taking: Reported on 08/08/2020)   No facility-administered encounter medications on file as of 08/08/2020.    Allergies as of 08/08/2020  . (No Known Allergies)    Physical Exam: Blood pressure 130/64, pulse 82, height 5' 3"  (1.6 m), weight 116 lb 9.6 oz (52.9 kg), SpO2 100 %. Gen:      No acute  distress HEENT:  EOMI, sclera anicteric Neck:     No masses; no thyromegaly Lungs:    Clear to auscultation bilaterally; normal respiratory effort CV:         Regular rate and rhythm; no murmurs Abd:      + bowel sounds; soft, non-tender; no palpable masses, no distension Ext:    No edema; adequate peripheral perfusion Skin:      Warm and dry; no rash Neuro: alert and oriented x 3 Psych: normal mood and affect  Data Reviewed: Imaging: CT chest 05/09/2018-mild emphysema, slight progression of interstitial lung disease with UIP pattern CT high-resolution 02/16/2020-mild emphysema, stable UIP pulmonary fibrosis. I have reviewed the images personally.  PFTs: 08/19/19 FVC 2.28 [101%], FEV1 1.78 [101%], F/F 78, TLC 3.86 [78%], DLCO 9.31 [49%] Minimal obstruction, minimal restriction with moderate-severe diffusion defect Overall minimal change in FVC and stable diffusion capacity compared to prior.  Labs: 10/19/2014 ANA, Aldolase, CCP, SCL 70, SSA/SSB, Jo-1 all negative; however ESR 40, RF 17 (both elevated) 94/80/1655 metabolic panel-normal LFTs  Assessment:  IPF Tolerating Esbriet.  She is taking only twice daily.  I have instructed her to take the prescribed dose 3 times daily Recheck hepatic panel, proBNP Given worsening dyspnea will reevaluate with high-res CT and PFTs  COPD Stop Symbicort and start Stiolto.  She does not have any evidence of asthma or peripheral eosinophilia and would not need ICS Smoking cessation encouraged but she is not interested in quitting.  Health maintenance Up-to-date with COVID-19 vaccine.  She plans on getting the booster soon Up-to-date with flu vaccine  Plan/Recommendations: - Continue Esbriet - Stop Symbicort, start Stiolto - Check CMP, CBC, proBNP - Follow-up high-res CT and PFTs  Marshell Garfinkel MD [facility] 08/08/2020, 8:35 AM  CC: Ann Held, *

## 2020-08-08 NOTE — Addendum Note (Signed)
Addended by: Suzzanne Cloud E on: 08/08/2020 08:59 AM   Modules accepted: Orders

## 2020-08-08 NOTE — Patient Instructions (Signed)
We will get some labs today including comprehensive metabolic panel, CBC, proBNP Schedule for high-res CT and PFTs to reevaluate lungs given worsening shortness of breath Stop the Symbicort and start Stiolto inhaler  Follow-up in 2 to 3 months.

## 2020-08-09 ENCOUNTER — Encounter: Payer: Self-pay | Admitting: Family Medicine

## 2020-08-09 ENCOUNTER — Encounter: Payer: Self-pay | Admitting: Internal Medicine

## 2020-08-09 ENCOUNTER — Encounter: Payer: Self-pay | Admitting: Vascular Surgery

## 2020-08-09 ENCOUNTER — Other Ambulatory Visit: Payer: Self-pay | Admitting: *Deleted

## 2020-08-09 ENCOUNTER — Ambulatory Visit (AMBULATORY_SURGERY_CENTER): Payer: Medicare Other | Admitting: Internal Medicine

## 2020-08-09 ENCOUNTER — Ambulatory Visit (INDEPENDENT_AMBULATORY_CARE_PROVIDER_SITE_OTHER): Payer: Medicare Other | Admitting: Vascular Surgery

## 2020-08-09 VITALS — BP 118/67 | HR 76 | Temp 97.5°F | Resp 20 | Ht 63.0 in | Wt 118.0 lb

## 2020-08-09 VITALS — BP 135/55 | HR 74 | Temp 97.1°F | Resp 21 | Ht 63.0 in | Wt 122.0 lb

## 2020-08-09 DIAGNOSIS — K551 Chronic vascular disorders of intestine: Secondary | ICD-10-CM

## 2020-08-09 DIAGNOSIS — R1033 Periumbilical pain: Secondary | ICD-10-CM

## 2020-08-09 DIAGNOSIS — K259 Gastric ulcer, unspecified as acute or chronic, without hemorrhage or perforation: Secondary | ICD-10-CM | POA: Diagnosis not present

## 2020-08-09 DIAGNOSIS — K2950 Unspecified chronic gastritis without bleeding: Secondary | ICD-10-CM | POA: Diagnosis not present

## 2020-08-09 DIAGNOSIS — R109 Unspecified abdominal pain: Secondary | ICD-10-CM | POA: Diagnosis not present

## 2020-08-09 DIAGNOSIS — K3189 Other diseases of stomach and duodenum: Secondary | ICD-10-CM | POA: Diagnosis not present

## 2020-08-09 DIAGNOSIS — K2970 Gastritis, unspecified, without bleeding: Secondary | ICD-10-CM

## 2020-08-09 LAB — PRO B NATRIURETIC PEPTIDE: NT-Pro BNP: 84 pg/mL (ref 0–301)

## 2020-08-09 MED ORDER — PANTOPRAZOLE SODIUM 40 MG PO TBEC: 40.0000 mg | DELAYED_RELEASE_TABLET | Freq: Every day | ORAL | 3 refills | Status: DC

## 2020-08-09 MED ORDER — SODIUM CHLORIDE 0.9 % IV SOLN: 500.0000 mL | Freq: Once | INTRAVENOUS | Status: DC

## 2020-08-09 NOTE — Patient Instructions (Addendum)
I saw several areas of stomach inflammation and one larger ulcer that look like inflammation and not anything like cancer.  I took biopsies.  It is possible that there is poor blood flow to the stomach causing this and poor blood flow to the intestines causing your problems.  We will see what the biopsies tell us and how your appointment goes with the vascular surgery specialist.  I suspect that they will most likely consider some additional testing to check the blood flow but they are the experts so we will see what they say.  Here are some changes that I want you to make:  Stop alendronate it can cause stomach ulcers  I am changing pantoprazole to 40 mg daily, you can take 2 of your existing ones but I sent a new prescription  Do not take meloxicam as that can cause ulcers  Restart Plavix or clopidogrel tomorrow unless the surgeons think otherwise  Further recommendations after the biopsies are done  I appreciate the opportunity to care for you. Gatha Mayer, MD, [facility]  Handout given for Gastritis.     YOU HAD AN ENDOSCOPIC PROCEDURE TODAY AT [facility]:   Refer to the procedure report that was given to you for any specific questions about what was found during the examination.  If the procedure report does not answer your questions, please call your gastroenterologist to clarify.  If you requested that your care partner not be given the details of your procedure findings, then the procedure report has been included in a sealed envelope for you to review at your convenience later.  YOU SHOULD EXPECT: Some feelings of bloating in the abdomen. Passage of more gas than usual.  Walking can help get rid of the air that was put into your GI tract during the procedure and reduce the bloating. If you had a lower endoscopy (such as a colonoscopy or flexible sigmoidoscopy) you may notice spotting of blood in your stool or on the toilet paper. If you underwent a bowel prep for  your procedure, you may not have a normal bowel movement for a few days.  Please Note:  You might notice some irritation and congestion in your nose or some drainage.  This is from the oxygen used during your procedure.  There is no need for concern and it should clear up in a day or so.  SYMPTOMS TO REPORT IMMEDIATELY:   Following upper endoscopy (EGD)  Vomiting of blood or coffee ground material  New chest pain or pain under the shoulder blades  Painful or persistently difficult swallowing  New shortness of breath  Fever of 100F or higher  Black, tarry-looking stools  For urgent or emergent issues, a gastroenterologist can be reached at any hour by calling [phone]. Do not use MyChart messaging for urgent concerns.    DIET:  We do recommend a small meal at first, but then you may proceed to your regular diet.  Drink plenty of fluids but you should avoid alcoholic beverages for 24 hours.  ACTIVITY:  You should plan to take it easy for the rest of today and you should NOT DRIVE or use heavy machinery until tomorrow (because of the sedation medicines used during the test).    FOLLOW UP: Our staff will call the number listed on Friday, 12/3, between 715 a and 8 am following your procedure to check on you and address any questions or concerns that you may have regarding the information given to  you following your procedure. If we do not reach you, we will leave a message.  We will attempt to reach you two times.  During this call, we will ask if you have developed any symptoms of COVID 19. If you develop any symptoms (ie: fever, flu-like symptoms, shortness of breath, cough etc.) before then, please call [phone].  If you test positive for Covid 19 in the 2 weeks post procedure, please call and report this information to [place].    If any biopsies were taken you will be contacted by phone or by letter within the next 1-3 weeks.  Please call us at [phone] if you have not heard  about the biopsies in 3 weeks.    SIGNATURES/CONFIDENTIALITY: You and/or your care partner have signed paperwork which will be entered into your electronic medical record.  These signatures attest to the fact that that the information above on your After Visit Summary has been reviewed and is understood.  Full responsibility of the confidentiality of this discharge information lies with you and/or your care-partner.

## 2020-08-09 NOTE — Telephone Encounter (Signed)
She can also make sure she is getting enough calcium in diet or supplement  And weight bearing exercise will help as well =----  physical therapy can help with that if needed

## 2020-08-09 NOTE — Progress Notes (Signed)
A/ox3, pleased with MAC, report to RN 

## 2020-08-09 NOTE — Op Note (Signed)
[facility] Patient Name: Christine Meyer Procedure Date: 08/09/2020 8:00 AM MRN: [id number] Endoscopist: Gatha Mayer , MD Age: 69 Referring MD:  Date of Birth: Oct 11, 1950 Gender: Female Account #: [id number] Procedure:                Upper GI endoscopy Indications:              Periumbilical abdominal pain, Anorexia, Weight loss Medicines:                Propofol per Anesthesia, Monitored Anesthesia Care Procedure:                Pre-Anesthesia Assessment:                           - Prior to the procedure, a History and Physical                            was performed, and patient medications and                            allergies were reviewed. The patient's tolerance of                            previous anesthesia was also reviewed. The risks                            and benefits of the procedure and the sedation                            options and risks were discussed with the patient.                            All questions were answered, and informed consent                            was obtained. Prior Anticoagulants: The patient                            last took Plavix (clopidogrel) 5 days prior to the                            procedure. ASA Grade Assessment: III - A patient                            with severe systemic disease. After reviewing the                            risks and benefits, the patient was deemed in                            satisfactory condition to undergo the procedure.                           After obtaining informed consent, the endoscope was  passed under direct vision. Throughout the                            procedure, the patient's blood pressure, pulse, and                            oxygen saturations were monitored continuously. The                            Endoscope was introduced through the mouth, and                            advanced to the second part of duodenum. The upper                             GI endoscopy was accomplished without difficulty.                            The patient tolerated the procedure well. Scope In: Scope Out: Findings:                 Inflammation characterized by erosions, erythema,                            atrophy, and serpentine ulcerations was found in                            the entire examined stomach. Biopsies were taken                            with a cold forceps for histology. Verification of                            patient identification for the specimen was done.                            Estimated blood loss was minimal.                           The esophagus was normal.                           The examined duodenum was normal.                           The cardia and gastric fundus were normal on                            retroflexion. Complications:            No immediate complications. Estimated Blood Loss:     Estimated blood loss was minimal. Impression:               - Gastritis. Biopsied. with erosions and 1 larger  ulcer - antrum, body ulcer, body and fundus/cardia                            bottles for path                           - Normal esophagus.                           - Normal examined duodenum. Recommendation:           - Patient has a contact number available for                            emergencies. The signs and symptoms of potential                            delayed complications were discussed with the                            patient. Return to normal activities tomorrow.                            Written discharge instructions were provided to the                            patient.                           - Resume previous diet.                           - Continue present medications.                           - Resume Plavix (clopidogrel) at prior dose                            tomorrow.                           - HOLD MELOXICAM,  ALENDRONATE                           - AWAIT VASCULAR SURGERY INPUT AS HAS CELIAC AND                            SMA STENOSES Gatha Mayer, MD 08/09/2020 8:37:44 AM This report has been signed electronically.

## 2020-08-09 NOTE — Progress Notes (Signed)
Called to room to assist during endoscopic procedure.  Patient ID and intended procedure confirmed with present staff. Received instructions for my participation in the procedure from the performing physician.  

## 2020-08-09 NOTE — Progress Notes (Signed)
Vs by CW in admitting

## 2020-08-09 NOTE — Progress Notes (Signed)
ASSESSMENT & PLAN:  69 y.o. female with chronic mesenteric ischemia.  She would benefit from mesenteric angiogram with possible intervention.  Dr. Donnella Meyer has an established relationship with this patient, and wishes to pursue mesenteric angiography.  Deferred to him.  Should she need open revascularization, I am happy to help her with this.  Recommend:  Complete cessation from all tobacco products. Blood glucose control with goal A1c < 7%. Blood pressure control with goal blood pressure < 140/90 mmHg. Lipid reduction therapy with goal LDL-C <100 mg/dL (<70 if symptomatic from PAD).  Aspirin 43m PO QD.  Clopidogrel 736mPO QD. Atorvastatin 40-80mg PO QD (or other "Christine intensity" statin therapy).  We will follow-up with her after angiogram of Dr. BaAlvester Meyer CHIEF COMPLAINT:   Weight loss  HISTORY:  HISTORY OF PRESENT ILLNESS: Christine Strubels a [age].o. female referred to our clinic by Dr. CaSilvano Ruskor evaluation of Christine Meyer chronic mesenteric ischemia.  Patient reports fairly classic postprandial midepigastric pain.  She has lost 30 pounds unintentionally over the last year.  Nothing seems to improve the pain except waiting.  She does not describe food fear, per se.  She has a strong peripheral vascular history and has undergone renal artery stenting and lower extremity interventions by Dr. BaAlvester Meyer the past.  No vomiting, nausea, constipation, diarrhea.  No blood per rectum.  Past Medical History:  Diagnosis Date  . Anemia    NOS  . 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 CAD (coronary artery disease)    Stent RCA 1999  . COPD (chronic obstructive pulmonary disease) ([facility]  . Diabetes mellitus, type 2 (HCC)    Diet controlled  . Gastric polyps - hyperplastic 02/23/2018  . Hyperlipidemia   . Hypertension   . OSA (obstructive sleep apnea) [name]   . PVD (peripheral vascular disease) (HCC)    Carotid stenosis, renal artery stenosis  . Sleep  apnea    dx'd but doesn't use mask   . Uterine cancer ([facility][id number]  Past Surgical History:  Procedure Laterality Date  . ABDOMINAL ANGIOGRAM  11/22/2014   Procedure: ABDOMINAL ANGIOGRAM;  Surgeon: Christine HarpMD;  [facility];  Service: Cardiovascular;;  . COLONOSCOPY  2000   negative  . CORONARY ANGIOPLASTY WITH STENT PLACEMENT  1999   RCA; Dr. GaMelvern Banker4 P4  . ESOPHAGOGASTRODUODENOSCOPY    . ILIAC ARTERY STENT Right 11/22/2014  . LOWER EXTREMITY ANGIOGRAM N/A 11/22/2014   Procedure: LOWER EXTREMITY ANGIOGRAM;  Surgeon: Christine HarpMD; L-SFA 100%, 3v runoff, R-pCIA 95>>0% w/ 8 mm x 18 mm long Balloon expandable stent; R-dCIA 67>>0% w/ 8 mm x 4 cm long Cordis Smart Nitinol self expanding stent   . RENAL ARTERY STENT Right May 2007; 08/2006   ; restenosis  . TOTAL ABDOMINAL HYSTERECTOMY  ?1980's    for uterine CA    Family History  Problem Relation Age of Onset  . Heart failure Mother        after hip fx.   . Hypertension Mother   . Diabetes Mother   . Deep vein thrombosis Mother   . Hypertension Father   . Diabetes Sister        insulin dependent  . Hypertension Brother   . Emphysema Brother   . Diabetes Brother        insulin dependent  . Diabetes Maternal Aunt        non-insulent dependent  .  Heart attack Paternal Aunt   . Diabetes Maternal Grandmother        non-insulin dependent  . Breast cancer Christine     Social History   Socioeconomic History  . Marital status: Single    Spouse name: Not on file  . Number of children: 4  . Years of education: Not on file  . Highest education level: Not on file  Occupational History  . 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    Employer: [employer]  Tobacco Use  . Smoking status: Current Every Day Smoker    Packs/day: 0.50    Years: 51.00    Pack years: 25.50    Types: Cigarettes  . Smokeless tobacco: Never Used  . Tobacco comment: currently  smoking 2-3 cigs per day  Vaping Use  . Vaping Use: Never used  Substance and Sexual Activity  . Alcohol use: No    Alcohol/week: 0.0 standard drinks  . Drug use: No  . Sexual activity: Yes  Other Topics Concern  . Not on file  Social History Narrative   Lives at home with grandson.    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 Difficulty of Paying Living Expenses: Not on file  Food Insecurity:   . Worried About Charity fundraiser in the Last Year: Not on file  . Ran Out of Food in the Last Year: Not on file  Transportation Needs:   . Lack of Transportation (Medical): Not on file  . Lack of Transportation (Non-Medical): Not on file  Physical Activity:   . Days of Exercise per Week: Not on file  . Minutes of Exercise per Session: Not on file  Stress:   . Feeling of Stress : Not on file  Social Connections:   . Frequency of Communication with Friends and Family: Not on file  . Frequency of Social Gatherings with Friends and Family: Not on file  . Attends Religious Services: Not on file  . Active Member of Clubs or Organizations: Not on file  . Attends Archivist Meetings: Not on file  . Marital Status: Not on file  Intimate Partner Violence:   . Fear of Current or Ex-Partner: Not on file  . Emotionally Abused: Not on file  . Physically Abused: Not on file  . Sexually Abused: Not on file    No Known Allergies  Current Outpatient Medications  Medication Sig Dispense Refill  . ACCU-CHEK SOFTCLIX LANCETS lancets Check blood sugar once daily. Dx:E11.9 100 each 5  . alendronate (FOSAMAX) 70 MG tablet TAKE 1 TABLET(70 MG) BY MOUTH 1 TIME A WEEK WITH A FULL GLASS OF WATER AND ON AN EMPTY STOMACH  STOP FOR NOW 08/09/20 12 tablet 3  . aspirin EC 81 MG tablet Take 1 tablet (81 mg total) by mouth daily. 90 tablet 0  . atorvastatin (LIPITOR) 40  MG tablet TAKE 1 TABLET BY MOUTH  DAILY 90 tablet 3  . Blood Glucose Monitoring Suppl (ONETOUCH VERIO) w/Device KIT Use daily to check blood sugar.   DX E11.9 1 kit 0  . Calcium 500-100 MG-UNIT CHEW Chew 1 tablet by mouth daily before breakfast. 150 tablet 1  . Cholecalciferol (VITAMIN D3) 50 MCG (2000 UT) capsule Take 1 capsule (2,000 Units total) by mouth daily. 60 capsule 11  . cyanocobalamin (,VITAMIN  B-12,) 1000 MCG/ML injection Inject 1 mL (1,000 mcg total) into the muscle every 30 (thirty) days. 30 mL 3  . ESBRIET 801 MG TABS TAKE 1 TABLET (801MG) BY  MOUTH THREE TIMES DAILY  WITH FOOD 90 tablet 0  . glucose blood (ONETOUCH VERIO) test strip USE AS DIRECTED DAILY 100 strip 12  . metoprolol succinate (TOPROL-XL) 25 MG 24 hr tablet Take 1 tablet (25 mg total) by mouth daily. 90 tablet 1  . Multiple Vitamins-Minerals (A THRU Z SELECT 50+ ADVANCED) TABS TAKE 1 TABLET BY MOUTH DAILY 220 tablet 1  . olmesartan (BENICAR) 20 MG tablet TAKE 1 TABLET(20 MG) BY MOUTH DAILY 90 tablet 1  . pantoprazole (PROTONIX) 40 MG tablet Take 1 tablet (40 mg total) by mouth daily before breakfast. 90 tablet 3  . spironolactone (ALDACTONE) 25 MG tablet TAKE 1 TABLET(25 MG) BY MOUTH DAILY 90 tablet 1  . SYRINGE-NEEDLE, DISP, 3 ML (BD INTEGRA SYRINGE) 25G X 1" 3 ML MISC USE TO INJECT B12 ONCE A MONTH 12 each 1  . Tiotropium Bromide-Olodaterol (STIOLTO RESPIMAT) 2.5-2.5 MCG/ACT AERS Inhale 2 puffs into the lungs daily. 4 g 5  . clopidogrel (PLAVIX) 75 MG tablet TAKE 1 TABLET(75 MG) BY MOUTH DAILY (Patient not taking: Reported on 08/08/2020) 90 tablet 3  . meloxicam (MOBIC) 7.5 MG tablet TAKE 1 TABLET BY MOUTH AS NEEDED AS DIRECTED (Patient not taking: Reported on 08/09/2020) 30 tablet 0  . mirtazapine (REMERON) 15 MG tablet Take 1 tablet (15 mg total) by mouth at bedtime. Start 1/2 tablet first week (Patient not taking: Reported on 08/08/2020) 30 tablet 2  . tiZANidine (ZANAFLEX) 4 MG capsule Take 1 capsule (4 mg total)  by mouth 3 (three) times daily as needed for muscle spasms. (Patient not taking: Reported on 08/08/2020) 90 capsule 0   No current facility-administered medications for this visit.    REVIEW OF SYSTEMS:  [X]  denotes positive finding, [ ]  denotes negative finding Cardiac  Comments:  Chest pain or chest pressure:    Shortness of breath upon exertion:    Short of breath when lying flat:    Irregular heart rhythm:        Vascular    Pain in calf, thigh, or hip brought on by ambulation:    Pain in feet at night that wakes you up from your sleep:     Blood clot in your veins:    Leg swelling:         Pulmonary    Oxygen at home:    Productive cough:     Wheezing:         Neurologic    Sudden weakness in arms or legs:     Sudden numbness in arms or legs:     Sudden onset of difficulty speaking or slurred speech:    Temporary loss of vision in one eye:     Problems with dizziness:         Gastrointestinal    Blood in stool:     Vomited blood:         Genitourinary    Burning when urinating:     Blood in urine:        Psychiatric    Major depression:         Hematologic    Bleeding problems:    Problems with blood clotting too easily:        Skin    Rashes or ulcers:  Constitutional    Fever or chills:     PHYSICAL EXAM:   Vitals:   08/09/20 1030  BP: 118/67  Pulse: 76  Resp: 20  Temp: (!) 97.5 F (36.4 C)  SpO2: 100%  Weight: 118 lb (53.5 kg)  Height: 5' 3"  (1.6 m)    Constitutional: Well appearing in no distress. Appears well nourished.  Neurologic: Normal gait and station. CN intact. No weakness. No sensory loss. Psychiatric: Mood and affect symmetric and appropriate. Eyes: No icterus. No conjunctival pallor. Ears, nose, throat: mucous membranes moist. Midline trachea. No carotid bruit. Cardiac: regular rate and rhythm.  Respiratory: unlabored. Abdominal: soft, non-tender, non-distended. No palpable pulsatile abdominal mass. Peripheral  vascular:  Radial pulse: L 2+ / R 2+  Femoral pulse: L absent / R 1+ Extremity: No edema. No cyanosis. No pallor.  Skin: No gangrene. No ulceration.  Lymphatic: No Stemmer's sign. No palpable lymphadenopathy.   DATA REVIEW:    Most recent CBC CBC Latest Ref Rng & Units 08/08/2020 07/07/2020 01/21/2020  WBC 4.0 - 10.5 K/uL 6.0 10.5 5.5  Hemoglobin 12.0 - 15.0 g/dL 11.9(L) 11.9 11.9(L)  Hematocrit 36 - 46 % 36.0 36.2 36.3  Platelets 150 - 400 K/uL 224.0 296 219.0     Most recent CMP CMP Latest Ref Rng & Units 08/08/2020 07/07/2020 01/21/2020  Glucose 70 - 99 mg/dL 92 94 96  BUN 6 - 23 mg/dL 10 17 14   Creatinine 0.40 - 1.20 mg/dL 1.12 1.06(H) 1.11  Sodium 135 - 145 mEq/L 143 139 138  Potassium 3.5 - 5.1 mEq/L 4.5 4.0 4.1  Chloride 96 - 112 mEq/L 107 101 104  CO2 19 - 32 mEq/L 28 27 24   Calcium 8.4 - 10.5 mg/dL 9.9 9.5 8.7  Total Protein 6.0 - 8.3 g/dL 6.9 6.2 6.4  Total Bilirubin 0.2 - 1.2 mg/dL 0.4 0.5 0.3  Alkaline Phos 39 - 117 U/L 52 - 68  AST 0 - 37 U/L 21 21 18   ALT 0 - 35 U/L 18 40(H) 17    Renal function Estimated Creatinine Clearance: 38.1 mL/min (by C-G formula based on SCr of 1.12 mg/dL).  Hgb A1c MFr Bld (% of total Hgb)  Date Value  07/07/2020 6.0 (H)    LDL Cholesterol (Calc)  Date Value Ref Range Status  07/07/2020 66 mg/dL (calc) Final    Comment:    Reference range: <100 . Desirable range <100 mg/dL for primary prevention;   <70 mg/dL for patients with CHD or diabetic patients  with > or = 2 CHD Christine factors. Marland Kitchen LDL-C is now calculated using the Martin-Hopkins  calculation, which is a validated novel method providing  better accuracy than the Friedewald equation in the  estimation of LDL-C.  Cresenciano Genre et al. Annamaria Helling. 2924;462(86): 2061-2068  (http://education.[facility].com/faq/FAQ164)      Vascular Imaging: CT abdomen pelvis with IV contrast 07/19/2020 personally reviewed Diffuse aortic atherosclerosis Origin of celiac and SMA involved  and aortic atherosclerosis Lack of arterial phase contrast limits evaluation  Yevonne Aline. Stanford Breed, MD [facility] Phone Number: [phone] 08/09/2020 11:13 AM

## 2020-08-11 ENCOUNTER — Telehealth: Payer: Self-pay

## 2020-08-11 NOTE — Telephone Encounter (Signed)
°  Follow up Call-  Call back number 08/09/2020 03/09/2019 02/18/2018  Post procedure Call Back phone  # [phone]  Permission to leave phone message Yes Yes Yes  Some recent data might be hidden     Patient questions:  Do you have a fever, pain , or abdominal swelling? No. Pain Score  0 *  Have you tolerated food without any problems? Yes.    Have you been able to return to your normal activities? Yes.    Do you have any questions about your discharge instructions: Diet   No. Medications  No. Follow up visit  No.  Do you have questions or concerns about your Care? No.  Actions: * If pain score is 4 or above: No action needed, pain <4. 1. Have you developed a fever since your procedure? no  2.   Have you had an respiratory symptoms (SOB or cough) since your procedure? no  3.   Have you tested positive for COVID 19 since your procedure no  4.   Have you had any family members/close contacts diagnosed with the COVID 19 since your procedure?  no   If yes to any of these questions please route to Joylene John, RN and Joella Prince, RN

## 2020-08-15 ENCOUNTER — Ambulatory Visit (HOSPITAL_BASED_OUTPATIENT_CLINIC_OR_DEPARTMENT_OTHER)
Admission: RE | Admit: 2020-08-15 | Discharge: 2020-08-15 | Disposition: A | Discharge status: Home / Self Care | Payer: Medicare Other | Source: Ambulatory Visit | Attending: Pulmonary Disease | Admitting: Pulmonary Disease

## 2020-08-15 ENCOUNTER — Other Ambulatory Visit: Payer: Self-pay

## 2020-08-15 DIAGNOSIS — J849 Interstitial pulmonary disease, unspecified: Secondary | ICD-10-CM | POA: Diagnosis not present

## 2020-08-15 DIAGNOSIS — J439 Emphysema, unspecified: Secondary | ICD-10-CM | POA: Diagnosis not present

## 2020-08-15 DIAGNOSIS — J8410 Pulmonary fibrosis, unspecified: Secondary | ICD-10-CM | POA: Diagnosis not present

## 2020-08-16 ENCOUNTER — Ambulatory Visit (INDEPENDENT_AMBULATORY_CARE_PROVIDER_SITE_OTHER): Payer: Medicare Other | Admitting: Cardiovascular Disease

## 2020-08-16 ENCOUNTER — Encounter: Payer: Self-pay | Admitting: Cardiovascular Disease

## 2020-08-16 ENCOUNTER — Ambulatory Visit (HOSPITAL_BASED_OUTPATIENT_CLINIC_OR_DEPARTMENT_OTHER)
Admission: RE | Admit: 2020-08-16 | Discharge: 2020-08-16 | Disposition: A | Discharge status: Home / Self Care | Payer: Medicare Other | Source: Ambulatory Visit | Attending: Cardiology | Admitting: Cardiology

## 2020-08-16 ENCOUNTER — Ambulatory Visit (HOSPITAL_COMMUNITY)
Admission: RE | Admit: 2020-08-16 | Discharge: 2020-08-16 | Disposition: A | Discharge status: Home / Self Care | Payer: Medicare Other | Source: Ambulatory Visit | Attending: Cardiology | Admitting: Cardiology

## 2020-08-16 ENCOUNTER — Ambulatory Visit (HOSPITAL_BASED_OUTPATIENT_CLINIC_OR_DEPARTMENT_OTHER)
Admission: RE | Admit: 2020-08-16 | Discharge: 2020-08-16 | Disposition: A | Discharge status: Home / Self Care | Payer: Medicare Other | Source: Ambulatory Visit | Attending: Cardiovascular Disease | Admitting: Cardiovascular Disease

## 2020-08-16 VITALS — BP 130/48 | HR 70 | Ht 62.0 in | Wt 116.4 lb

## 2020-08-16 DIAGNOSIS — I10 Essential (primary) hypertension: Secondary | ICD-10-CM | POA: Diagnosis not present

## 2020-08-16 DIAGNOSIS — E785 Hyperlipidemia, unspecified: Secondary | ICD-10-CM | POA: Diagnosis not present

## 2020-08-16 DIAGNOSIS — K559 Vascular disorder of intestine, unspecified: Secondary | ICD-10-CM | POA: Diagnosis not present

## 2020-08-16 DIAGNOSIS — I739 Peripheral vascular disease, unspecified: Secondary | ICD-10-CM

## 2020-08-16 DIAGNOSIS — Z95828 Presence of other vascular implants and grafts: Secondary | ICD-10-CM | POA: Insufficient documentation

## 2020-08-16 DIAGNOSIS — I6523 Occlusion and stenosis of bilateral carotid arteries: Secondary | ICD-10-CM

## 2020-08-16 NOTE — Assessment & Plan Note (Signed)
History of essential hypertension a blood pressure measured today 130/48.  She is on metoprolol, olmesartan and spironolactone.

## 2020-08-16 NOTE — Assessment & Plan Note (Signed)
History of carotid artery disease status post recent Doppler study performed 08/16/2020 revealing moderate left ICA stenosis.  This will be repeated on annual basis.

## 2020-08-16 NOTE — Assessment & Plan Note (Signed)
Christine Meyer has abdominal pain/abdominal angina and food avoidance with weight loss.  She does have a midline abdominal bruit and saw Dr. Carlean Purl, her gastroenterologist, who ultimately referred her to Dr. Stanford Breed , vascular surgeon surgeon, for evaluation and treatment of mesenteric ischemia.  Because of my long relationship with her she was referred back to me for further evaluation of this.  Apparently the CT scan that she had on 07/19/2020 in addition to showing aortoiliac disease apparently showed mesenteric obstructive disease although this is not mentioned in the body of the report.  I will need to review this with the radiologist.  Her symptoms do sound consistent with this and she does have a midline bruit.  We will arrange for her to undergo angiography and potential endovascular therapy.

## 2020-08-16 NOTE — Progress Notes (Signed)
08/16/2020 Christine Meyer   1950/09/26  [id number]  Primary Physician Ann Held, DO Primary Cardiologist: Lorretta Harp MD [name], [place]  HPI:  Christine Meyer is a 69 y.o.  thin appearing single African-American female mother of 4 children . She was referred by Dr. Percival Spanish , her cardiologist, for peripheral vascular evaluation. I last saw her in the office  09/09/2019.  She is accompanied by her daughter Christine Meyer who is a Marine scientist for Dr. Alain Marion.She has a history of myocardial infarction back in 1999 undergoing stenting of her RCA by Dr. Melvern Banker.her chronic risk factors are notable for continued tobacco abuse, treated hypertension and hyperlipidemia. She is complained of increasing dyspnea on exertion over the last 6 months as well as right calf claudication. Dr. Percival Spanish saw her and ordered a stress test. 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 She has had right renal artery stenting in the past as well as right external iliac artery stenting as well. 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 I stented both of these areas. She did have an occluded left SFA with high-grade segmental diffuse right SFA stenosis. Her Dopplers and symptoms improved.  Since I saw her a year ago she continues to do well.  She does complain of some abdominal pain and is experiencing some weight loss.  She saw Dr. Carlean Purl, her gastroenterologist who obtained an abdominal CTA and referred her to Dr. Stanford Breed , vascular surgeon for consideration of peripheral angiography and treatment of mesenteric ischemia.  She had carotid Dopplers that showed moderate left ICA stenosis and lower extremity Dopplers performed today that suggested in-stent restenosis within her right iliac stent.   Current  Meds  Medication Sig  . ACCU-CHEK SOFTCLIX LANCETS lancets Check blood sugar once daily. Dx:E11.9  . aspirin EC 81 MG tablet Take 1 tablet (81 mg total) by mouth daily.  Marland Kitchen atorvastatin (LIPITOR) 40 MG tablet TAKE 1 TABLET BY MOUTH  DAILY  . Blood Glucose Monitoring Suppl (ONETOUCH VERIO) w/Device KIT Use daily to check blood sugar.   DX E11.9  . Calcium 500-100 MG-UNIT CHEW Chew 1 tablet by mouth daily before breakfast.  . Cholecalciferol (VITAMIN D3) 50 MCG (2000 UT) capsule Take 1 capsule (2,000 Units total) by mouth daily.  . clopidogrel (PLAVIX) 75 MG tablet TAKE 1 TABLET(75 MG) BY MOUTH DAILY  . cyanocobalamin (,VITAMIN B-12,) 1000 MCG/ML injection Inject 1 mL (1,000 mcg total) into the muscle every 30 (thirty) days.  . ESBRIET 801 MG TABS TAKE 1 TABLET (801MG) BY  MOUTH THREE TIMES DAILY  WITH FOOD  . glucose blood (ONETOUCH VERIO) test strip USE AS DIRECTED DAILY  . meloxicam (MOBIC) 7.5 MG tablet TAKE 1 TABLET BY MOUTH AS NEEDED AS DIRECTED  . metoprolol succinate (TOPROL-XL) 25 MG 24 hr tablet Take 1 tablet (25 mg total) by mouth daily.  . mirtazapine (REMERON) 15 MG tablet Take 1 tablet (15 mg total) by mouth at bedtime. Start 1/2 tablet first week  . Multiple Vitamins-Minerals (A THRU Z SELECT 50+ ADVANCED) TABS TAKE 1 TABLET BY MOUTH DAILY  . olmesartan (BENICAR) 20 MG tablet TAKE 1 TABLET(20 MG) BY MOUTH DAILY  . pantoprazole (PROTONIX) 40 MG tablet Take 1 tablet (40 mg total) by mouth daily before breakfast.  . spironolactone (ALDACTONE) 25 MG tablet  TAKE 1 TABLET(25 MG) BY MOUTH DAILY  . SYRINGE-NEEDLE, DISP, 3 ML (BD INTEGRA SYRINGE) 25G X 1" 3 ML MISC USE TO INJECT B12 ONCE A MONTH  . Tiotropium Bromide-Olodaterol (STIOLTO RESPIMAT) 2.5-2.5 MCG/ACT AERS Inhale 2 puffs into the lungs daily.  Marland Kitchen tiZANidine (ZANAFLEX) 4 MG capsule Take 1 capsule (4 mg total) by mouth 3 (three) times daily as needed for muscle spasms.     No Known Allergies  Social History   Socioeconomic  History  . Marital status: Single    Spouse name: Not on file  . Number of children: 4  . Years of education: Not on file  . Highest education level: Not on file  Occupational History  . 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    Employer: [employer]  Tobacco Use  . Smoking status: Current Every Day Smoker    Packs/day: 0.50    Years: 51.00    Pack years: 25.50    Types: Cigarettes  . Smokeless tobacco: Never Used  . Tobacco comment: currently smoking 2-3 cigs per day  Vaping Use  . Vaping Use: Never used  Substance and Sexual Activity  . Alcohol use: No    Alcohol/week: 0.0 standard drinks  . Drug use: No  . Sexual activity: Yes  Other Topics Concern  . Not on file  Social History Narrative   Lives at home with grandson.    Daughter Christine Meyer is CMA Financial controller primary care [employer]   Retired Risk analyst   Smoker, no EtOH/drugs   Social Determinants of Radio broadcast assistant Strain:   . Difficulty of Paying Living Expenses: Not on file  Food Insecurity:   . Worried About Charity fundraiser in the Last Year: Not on file  . Ran Out of Food in the Last Year: Not on file  Transportation Needs:   . Lack of Transportation (Medical): Not on file  . Lack of Transportation (Non-Medical): Not on file  Physical Activity:   . Days of Exercise per Week: Not on file  . Minutes of Exercise per Session: Not on file  Stress:   . Feeling of Stress : Not on file  Social Connections:   . Frequency of Communication with Friends and Family: Not on file  . Frequency of Social Gatherings with Friends and Family: Not on file  . Attends Religious Services: Not on file  . Active Member of Clubs or Organizations: Not on file  . Attends Archivist Meetings: Not on file  . Marital Status: Not on file  Intimate Partner Violence:   . Fear of Current or Ex-Partner: Not on file  . Emotionally Abused: Not on file  . Physically Abused:  Not on file  . Sexually Abused: Not on file     Review of Systems: General: negative for chills, fever, night sweats or weight changes.  Cardiovascular: negative for chest pain, dyspnea on exertion, edema, orthopnea, palpitations, paroxysmal nocturnal dyspnea or shortness of breath Dermatological: negative for rash Respiratory: negative for cough or wheezing Urologic: negative for hematuria Abdominal: negative for nausea, vomiting, diarrhea, bright red blood per rectum, melena, or hematemesis Neurologic: negative for visual changes, syncope, or dizziness All other systems reviewed and are otherwise negative except as noted above.    Blood pressure (!) 130/48, pulse 70, height 5' 2"  (1.575 m), weight 116 lb 6.4 oz (52.8 kg).  General appearance: alert and no distress Neck: no adenopathy, no JVD, supple, symmetrical,  trachea midline, thyroid not enlarged, symmetric, no tenderness/mass/nodules and Bilateral carotid bruits by Lungs: clear to auscultation bilaterally Heart: regular rate and rhythm, S1, S2 normal, no murmur, click, rub or gallop Extremities: extremities normal, atraumatic, no cyanosis or edema Pulses: 2+ and symmetric Diminished pedal pulses bilaterally Skin: Skin color, texture, turgor normal. No rashes or lesions Neurologic: Alert and oriented X 3, normal strength and tone. Normal symmetric reflexes. Normal coordination and gait  EKG sinus rhythm at 70 without ST or T wave changes.  Personally reviewed this EKG.  ASSESSMENT AND PLAN:   Essential hypertension History of essential hypertension a blood pressure measured today 130/48.  She is on metoprolol, olmesartan and spironolactone.  CAROTID BRUITS, BILATERAL History of carotid artery disease status post recent Doppler study performed 08/16/2020 revealing moderate left ICA stenosis.  This will be repeated on annual basis.  Dyslipidemia History of dyslipidemia on statin therapy with lipid profile performed 07/07/2020  revealing total cholesterol 141, LDL 66 and HDL of 56.  Peripheral arterial disease History of peripheral arterial disease status post remote right renal artery stenting and right external iliac artery stenting.  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  I restented her right iliac artery which resulted in marked improvement.  She did have moderate segmental right SFA disease with three-vessel runoff and an occluded left SFA with three-vessel runoff.  She does complain of some right lower extremity claudication and "coldness in her legs.  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  She wishes to proceed with angiography potential intervention.  Mesenteric ischemia [facility]) Ms. Ouderkirk has abdominal pain/abdominal angina and food avoidance with weight loss.  She does have a midline abdominal bruit and saw Dr. Carlean Purl, her gastroenterologist, who ultimately referred her to Dr. Stanford Breed , vascular surgeon surgeon, for evaluation and treatment of mesenteric ischemia.  Because of my long relationship with her she was referred back to me for further evaluation of this.  Apparently the CT scan that she had on 07/19/2020 in addition to showing aortoiliac disease apparently showed mesenteric obstructive disease although this is not mentioned in the body of the report.  I will need to review this with the radiologist.  Her symptoms do sound consistent with this and she does have a midline bruit.  We will arrange for her to undergo angiography and potential endovascular therapy.      Lorretta Harp MD FACP,FACC,FAHA, [facility] 08/16/2020 12:40 PM

## 2020-08-16 NOTE — Assessment & Plan Note (Signed)
History of dyslipidemia on statin therapy with lipid profile performed 07/07/2020 revealing total cholesterol 141, LDL 66 and HDL of 56.

## 2020-08-16 NOTE — Assessment & Plan Note (Signed)
History of peripheral arterial disease status post remote right renal artery stenting and right external iliac artery stenting.  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  I restented her right iliac artery which resulted in marked improvement.  She did have moderate segmental right SFA disease with three-vessel runoff and an occluded left SFA with three-vessel runoff.  She does complain of some right lower extremity claudication and "coldness in her legs.  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  She wishes to proceed with angiography potential intervention.

## 2020-08-16 NOTE — Patient Instructions (Addendum)
    [place] [facility] [address] Dept: [phone] Loc: [phone]  Christine Meyer  08/16/2020  You are scheduled for a Peripheral Angiogram on Monday, January 17 with Dr. Quay Burow.  1. Please arrive at the [facility] (Main Entrance A) at [facility]: [address] [place], [place] [address] at 7:30 AM (This time is two hours before your procedure to ensure your preparation). Free valet parking service is available.   Special note: Every effort is made to have your procedure done on time. Please understand that emergencies sometimes delay scheduled procedures.  2. Diet: Do not eat solid foods after midnight.  The patient may have clear liquids until 5am upon the day of the procedure.  3. Labs: You will need to have blood drawn in 2-3weeks at your convenience. You do not need to be fasting.  4. Medication instructions in preparation for your procedure:     On the morning of your procedure, take your Aspirin and Plavix/Clopidogrel and any morning medicines NOT listed above.  You may use sips of water.  5. Plan for one night stay--bring personal belongings. 6. Bring a current list of your medications and current insurance cards. 7. You MUST have a responsible person to drive you home. 8. Someone MUST be with you the first 24 hours after you arrive home or your discharge will be delayed. 9. Please wear clothes that are easy to get on and off and wear slip-on shoes.  Thank you for allowing [place] to care for you!   -- [facility] You will need a COVID-19  test prior to your procedure. You are scheduled for Sat. 1/15 at 11:05AM. This is a Drive Up Visit at [address] [place],  [address]. Someone will direct you to the appropriate testing line. Stay in your car and someone will be with you shortly.  You will need a lower  extremity vascular ultrasound 1 week after your procedure.  You will need to see Dr. Gwenlyn Found 2 weeks after your procedure.

## 2020-08-17 ENCOUNTER — Other Ambulatory Visit: Payer: Self-pay

## 2020-08-17 ENCOUNTER — Encounter: Payer: Self-pay | Admitting: *Deleted

## 2020-08-17 DIAGNOSIS — I739 Peripheral vascular disease, unspecified: Secondary | ICD-10-CM

## 2020-08-17 MED ORDER — SODIUM CHLORIDE 0.9% FLUSH: 3.0000 mL | Freq: Two times a day (BID) | INTRAVENOUS | Status: DC

## 2020-08-25 ENCOUNTER — Other Ambulatory Visit: Payer: Self-pay | Admitting: Internal Medicine

## 2020-08-25 MED ORDER — HYDROCODONE-ACETAMINOPHEN 5-325 MG PO TABS: 1.0000 | ORAL_TABLET | Freq: Four times a day (QID) | ORAL | 0 refills | Status: DC | PRN

## 2020-08-25 NOTE — Telephone Encounter (Signed)
Spoke with Stassi Fadely (daughter) (ok per DPR), pt is having increased pain in her belly and is having trouble eating and keeping food down.  Per Dr. Gwenlyn Found we are able to move up her procedure to 09/19/20 @ 0930 instead of 09/26/20.  Daughter asked about any suggestions for food that the pt could eat, recommended soups and encouraged her to make sure that the pt is drinking liquids to stay hydrated.  Daughter verbalized understanding.

## 2020-09-08 ENCOUNTER — Other Ambulatory Visit: Payer: Self-pay | Admitting: Family Medicine

## 2020-09-08 ENCOUNTER — Other Ambulatory Visit: Payer: Self-pay

## 2020-09-08 ENCOUNTER — Ambulatory Visit (INDEPENDENT_AMBULATORY_CARE_PROVIDER_SITE_OTHER): Payer: Medicare Other | Admitting: Pulmonary Disease

## 2020-09-08 ENCOUNTER — Telehealth: Payer: Self-pay

## 2020-09-08 DIAGNOSIS — M545 Low back pain, unspecified: Secondary | ICD-10-CM

## 2020-09-08 DIAGNOSIS — J849 Interstitial pulmonary disease, unspecified: Secondary | ICD-10-CM | POA: Diagnosis not present

## 2020-09-08 DIAGNOSIS — I739 Peripheral vascular disease, unspecified: Secondary | ICD-10-CM | POA: Diagnosis not present

## 2020-09-08 DIAGNOSIS — E785 Hyperlipidemia, unspecified: Secondary | ICD-10-CM | POA: Diagnosis not present

## 2020-09-08 DIAGNOSIS — K559 Vascular disorder of intestine, unspecified: Secondary | ICD-10-CM | POA: Diagnosis not present

## 2020-09-08 DIAGNOSIS — I10 Essential (primary) hypertension: Secondary | ICD-10-CM | POA: Diagnosis not present

## 2020-09-08 LAB — PULMONARY FUNCTION TEST
DL/VA % pred: 59 %
DL/VA: 2.47 ml/min/mmHg/L
DLCO cor % pred: 35 %
DLCO cor: 6.65 ml/min/mmHg
DLCO unc % pred: 33 %
DLCO unc: 6.32 ml/min/mmHg
FEF 25-75 Post: 0.51 L/sec
FEF 25-75 Pre: 1.26 L/sec
FEF2575-%Change-Post: -60 %
FEF2575-%Pred-Post: 31 %
FEF2575-%Pred-Pre: 79 %
FEV1-%Change-Post: -26 %
FEV1-%Pred-Post: 61 %
FEV1-%Pred-Pre: 83 %
FEV1-Post: 1.05 L
FEV1-Pre: 1.43 L
FEV1FVC-%Change-Post: -19 %
FEV1FVC-%Pred-Pre: 102 %
FEV6-%Change-Post: -8 %
FEV6-%Pred-Post: 77 %
FEV6-%Pred-Pre: 84 %
FEV6-Post: 1.65 L
FEV6-Pre: 1.81 L
FEV6FVC-%Change-Post: 0 %
FEV6FVC-%Pred-Post: 103 %
FEV6FVC-%Pred-Pre: 104 %
FVC-%Change-Post: -8 %
FVC-%Pred-Post: 74 %
FVC-%Pred-Pre: 81 %
FVC-Post: 1.65 L
FVC-Pre: 1.81 L
Post FEV1/FVC ratio: 64 %
Post FEV6/FVC ratio: 99 %
Pre FEV1/FVC ratio: 79 %
Pre FEV6/FVC Ratio: 100 %
RV % pred: 98 %
RV: 2.12 L
TLC % pred: 74 %
TLC: 3.64 L

## 2020-09-08 NOTE — Progress Notes (Signed)
Full PFT completed today ? ?

## 2020-09-08 NOTE — Telephone Encounter (Signed)
Called pt to inform her that the date of her PV procedure will be moved back to the original date of 09/26/20 due to a scheduling conflict. Pt verbalizes understanding.

## 2020-09-09 LAB — CBC
Hematocrit: 36.1 % (ref 34.0–46.6)
Hemoglobin: 12 g/dL (ref 11.1–15.9)
MCH: 29.1 pg (ref 26.6–33.0)
MCHC: 33.2 g/dL (ref 31.5–35.7)
MCV: 87 fL (ref 79–97)
Platelets: 220 10*3/uL (ref 150–450)
RBC: 4.13 x10E6/uL (ref 3.77–5.28)
RDW: 14.2 % (ref 11.7–15.4)
WBC: 7.6 10*3/uL (ref 3.4–10.8)

## 2020-09-09 LAB — BASIC METABOLIC PANEL
BUN/Creatinine Ratio: 11 — ABNORMAL LOW (ref 12–28)
BUN: 12 mg/dL (ref 8–27)
CO2: 23 mmol/L (ref 20–29)
Calcium: 9.7 mg/dL (ref 8.7–10.3)
Chloride: 105 mmol/L (ref 96–106)
Creatinine, Ser: 1.12 mg/dL — ABNORMAL HIGH (ref 0.57–1.00)
GFR calc Af Amer: 57 mL/min/{1.73_m2} — ABNORMAL LOW (ref >59)
GFR calc non Af Amer: 50 mL/min/{1.73_m2} — ABNORMAL LOW (ref >59)
Glucose: 112 mg/dL — ABNORMAL HIGH (ref 65–99)
Potassium: 4.8 mmol/L (ref 3.5–5.2)
Sodium: 142 mmol/L (ref 134–144)

## 2020-09-15 ENCOUNTER — Other Ambulatory Visit (HOSPITAL_COMMUNITY): Payer: Medicare Other

## 2020-09-16 ENCOUNTER — Other Ambulatory Visit (HOSPITAL_COMMUNITY): Payer: Self-pay | Admitting: Cardiovascular Disease

## 2020-09-16 DIAGNOSIS — Z95828 Presence of other vascular implants and grafts: Secondary | ICD-10-CM

## 2020-09-22 ENCOUNTER — Telehealth: Payer: Self-pay | Admitting: *Deleted

## 2020-09-22 NOTE — Telephone Encounter (Signed)
Pt contacted pre-catheterization scheduled at [facility] for: Monday September 26, 2020 9:30 AM Verified arrival time and place: [facility]) at: 7:30 AM   No solid food after midnight prior to cath, clear liquids until 5 AM day of procedure.  Hold: Spironolactone-day before and day of procedure-GFR 73 Olmesartan-day before and day of procedure-GFR 57 Mobic-day before and day of procedure -GFR 57  Except hold medications AM meds can be  taken pre-cath with sips of water including: ASA 81 mg Plavix 75 mg  Confirmed patient has responsible adult to drive home post procedure and be with patient first 24 hours after arriving home: yes  You are allowed ONE visitor in the waiting room during the time you are at the hospital for your procedure. Both you and your visitor must wear a mask once you enter the hospital.  Reviewed procedure/mask/visitor instructions with patient.

## 2020-09-23 ENCOUNTER — Other Ambulatory Visit: Payer: Self-pay

## 2020-09-23 ENCOUNTER — Encounter: Payer: Self-pay | Admitting: Pulmonary Disease

## 2020-09-23 ENCOUNTER — Ambulatory Visit (INDEPENDENT_AMBULATORY_CARE_PROVIDER_SITE_OTHER): Payer: Medicare Other | Admitting: Pulmonary Disease

## 2020-09-23 VITALS — BP 124/60 | HR 76 | Temp 97.7°F | Ht 62.0 in | Wt 108.4 lb

## 2020-09-23 DIAGNOSIS — J84112 Idiopathic pulmonary fibrosis: Secondary | ICD-10-CM | POA: Diagnosis not present

## 2020-09-23 DIAGNOSIS — J449 Chronic obstructive pulmonary disease, unspecified: Secondary | ICD-10-CM

## 2020-09-23 DIAGNOSIS — J4489 Other specified chronic obstructive pulmonary disease: Secondary | ICD-10-CM

## 2020-09-23 DIAGNOSIS — J8410 Pulmonary fibrosis, unspecified: Secondary | ICD-10-CM

## 2020-09-23 DIAGNOSIS — Z5181 Encounter for therapeutic drug level monitoring: Secondary | ICD-10-CM | POA: Diagnosis not present

## 2020-09-23 LAB — COMPREHENSIVE METABOLIC PANEL
ALT: 14 U/L (ref 0–35)
AST: 19 U/L (ref 0–37)
Albumin: 4 g/dL (ref 3.5–5.2)
Alkaline Phosphatase: 51 U/L (ref 39–117)
BUN: 9 mg/dL (ref 6–23)
CO2: 27 mEq/L (ref 19–32)
Calcium: 9.5 mg/dL (ref 8.4–10.5)
Chloride: 103 mEq/L (ref 96–112)
Creatinine, Ser: 1.05 mg/dL (ref 0.40–1.20)
GFR: 53.46 mL/min — ABNORMAL LOW (ref >60.00)
Glucose, Bld: 99 mg/dL (ref 70–99)
Potassium: 4.3 mEq/L (ref 3.5–5.1)
Sodium: 137 mEq/L (ref 135–145)
Total Bilirubin: 0.6 mg/dL (ref 0.2–1.2)
Total Protein: 6.5 g/dL (ref 6.0–8.3)

## 2020-09-23 MED ORDER — STIOLTO RESPIMAT 2.5-2.5 MCG/ACT IN AERS: 2.0000 | INHALATION_SPRAY | Freq: Every day | RESPIRATORY_TRACT | 0 refills | Status: DC

## 2020-09-23 NOTE — Addendum Note (Signed)
Addended by: Suzzanne Cloud E on: 09/23/2020 09:22 AM   Modules accepted: Orders

## 2020-09-23 NOTE — Addendum Note (Signed)
Addended byCoralie Keens on: 09/23/2020 02:38 PM   Modules accepted: Orders

## 2020-09-23 NOTE — Progress Notes (Signed)
Christine Meyer    [id number]    August 10, 1951  Primary Care Physician:Lowne Cheri Rous Alferd Apa, DO  Referring Physician: Carollee Herter, Alferd Apa, DO [address] [place],  [place] [address]  Chief complaint: Follow-up for COPD, idiopathic pulmonary fibrosis  HPI: Christine Meyer was diagnosed with IPF in 2016 after CT scan showing UIP pulmonary fibrosis and centrilobular emphysema.  She has significant smoking history.  Evaluated by rheumatology in 2016 by Dr. Trudie Reed for mildly elevated rheumatoid factor with no evidence of connective tissue disease She is started on Esbriet in 2016 and has tolerated it well with no issues  Pets: Dogs, no cats, birds Occupation: Retired Training and development officer Exposures: No known exposures.  No mold, hot tub, Jacuzzi, no feather pillows or comforter Smoking history: 13-pack-year smoker.  Continues to smoke every day Travel history: No significant travel history Relevant family history: No significant family history of lung disease  Interim history: She has increased Esbriet to full dose a few months ago and is tolerating it well. Complains of chronic dyspnea with white mucus.  Recently had an EGD with findings of gastritis.  Outpatient Encounter Medications as of 09/23/2020  Medication Sig  . ACCU-CHEK SOFTCLIX LANCETS lancets Check blood sugar once daily. Dx:E11.9  . aspirin EC 81 MG tablet Take 1 tablet (81 mg total) by mouth daily.  Marland Kitchen atorvastatin (LIPITOR) 40 MG tablet TAKE 1 TABLET BY MOUTH  DAILY (Patient taking differently: Take 40 mg by mouth daily.)  . Blood Glucose Monitoring Suppl (ONETOUCH VERIO) w/Device KIT Use daily to check blood sugar.   DX E11.9  . Calcium 500-100 MG-UNIT CHEW Chew 1 tablet by mouth daily before breakfast.  . Cholecalciferol (VITAMIN D3) 50 MCG (2000 UT) capsule Take 1 capsule (2,000 Units total) by mouth daily.  . clopidogrel (PLAVIX) 75 MG tablet TAKE 1 TABLET(75 MG) BY MOUTH DAILY (Patient taking differently: Take  75 mg by mouth daily.)  . cyanocobalamin (,VITAMIN B-12,) 1000 MCG/ML injection Inject 1 mL (1,000 mcg total) into the muscle every 30 (thirty) days.  . ESBRIET 801 MG TABS TAKE 1 TABLET (801MG) BY  MOUTH THREE TIMES DAILY  WITH FOOD (Patient taking differently: Take 801 mg by mouth 2 (two) times daily.)  . glucose blood (ONETOUCH VERIO) test strip USE AS DIRECTED DAILY  . HYDROcodone-acetaminophen (NORCO/VICODIN) 5-325 MG tablet Take 1 tablet by mouth every 6 (six) hours as needed for severe pain.  . meloxicam (MOBIC) 7.5 MG tablet Take 1 tablet (7.5 mg total) by mouth daily as needed for pain.  . metoprolol succinate (TOPROL-XL) 25 MG 24 hr tablet Take 1 tablet (25 mg total) by mouth daily.  . Multiple Vitamins-Minerals (A THRU Z SELECT 50+ ADVANCED) TABS TAKE 1 TABLET BY MOUTH DAILY (Patient taking differently: Take 1 tablet by mouth daily.)  . olmesartan (BENICAR) 20 MG tablet TAKE 1 TABLET(20 MG) BY MOUTH DAILY (Patient taking differently: Take 20 mg by mouth daily.)  . pantoprazole (PROTONIX) 40 MG tablet Take 1 tablet (40 mg total) by mouth daily before breakfast.  . spironolactone (ALDACTONE) 25 MG tablet TAKE 1 TABLET(25 MG) BY MOUTH DAILY (Patient taking differently: Take 25 mg by mouth daily.)  . SYRINGE-NEEDLE, DISP, 3 ML (BD INTEGRA SYRINGE) 25G X 1" 3 ML MISC USE TO INJECT B12 ONCE A MONTH  . Tiotropium Bromide-Olodaterol (STIOLTO RESPIMAT) 2.5-2.5 MCG/ACT AERS Inhale 2 puffs into the lungs daily.  Marland Kitchen tiZANidine (ZANAFLEX) 4 MG capsule Take 1 capsule (  4 mg total) by mouth 3 (three) times daily as needed for muscle spasms.  . mirtazapine (REMERON) 15 MG tablet Take 1 tablet (15 mg total) by mouth at bedtime. Start 1/2 tablet first week (Patient not taking: Reported on 09/23/2020)  . [DISCONTINUED] pantoprazole (PROTONIX) 40 MG tablet Take 40 mg by mouth daily before breakfast.   Facility-Administered Encounter Medications as of 09/23/2020  Medication  . sodium chloride flush (NS) 0.9 %  injection 3 mL    Allergies as of 09/23/2020  . (No Known Allergies)    Physical Exam: Blood pressure 124/60, pulse 76, temperature 97.7 F (36.5 C), temperature source Oral, height 5' 2"  (1.575 m), weight 108 lb 6.4 oz (49.2 kg), SpO2 96 %. Gen:      No acute distress HEENT:  EOMI, sclera anicteric Neck:     No masses; no thyromegaly Lungs:    Clear to auscultation bilaterally; normal respiratory effort CV:         Regular rate and rhythm; no murmurs Abd:      + bowel sounds; soft, non-tender; no palpable masses, no distension Ext:    No edema; adequate peripheral perfusion Skin:      Warm and dry; no rash Neuro: alert and oriented x 3 Psych: normal mood and affect  Data Reviewed: Imaging: CT chest 05/09/2018-mild emphysema, slight progression of interstitial lung disease with UIP pattern CT high-resolution 02/16/2020-mild emphysema, stable UIP pulmonary fibrosis. CT high-resolution 08/15/2020- stable pattern of pulmonary fibrosis, emphysema I have reviewed the images personally.  PFTs: 08/19/19 FVC 2.28 [101%], FEV1 1.78 [101%], F/F 78, TLC 3.86 [78%], DLCO 9.31 [49%]  09/08/2020 FVC 1.65 [74%], FEV1 1.05 [61%], F/F 64, TLC 3.64 [74%], DLCO 6.32 (33%) Moderate restriction with severe diffusion defect  Labs: 10/19/2014 ANA, Aldolase, CCP, SCL 70, SSA/SSB, Jo-1 all negative; however ESR 40, RF 17 (both elevated) 35/36/1443 metabolic panel-normal LFTs  Assessment:  IPF Tolerating Esbriet.  CT reviewed with stable pulmonary fibrosis.  There is slight reduction in lung function on PFTs. Esbriet dose increased to full dose and no significant GI issues Recheck hepatic panel today  COPD Stable on stiolto Smoking cessation encouraged but she is not interested in quitting.  Health maintenance Up-to-date with COVID-19 vaccine.  She plans on getting the booster soon Up-to-date with flu vaccine  Plan/Recommendations: - Continue Esbriet - Check CMP  Follow-up in 3  months.  Marshell Garfinkel MD [facility] 09/23/2020, 9:04 AM  CC: Ann Held, *

## 2020-09-23 NOTE — Patient Instructions (Addendum)
We will check comprehensive metabolic panel today Continue the Esbriet and inhalers Follow-up in 3 months.

## 2020-09-24 ENCOUNTER — Other Ambulatory Visit (HOSPITAL_COMMUNITY)
Admission: RE | Admit: 2020-09-24 | Discharge: 2020-09-24 | Disposition: A | Discharge status: Home / Self Care | Payer: Medicare Other | Source: Ambulatory Visit | Attending: Cardiovascular Disease | Admitting: Cardiovascular Disease

## 2020-09-24 DIAGNOSIS — Z01812 Encounter for preprocedural laboratory examination: Secondary | ICD-10-CM | POA: Diagnosis not present

## 2020-09-24 DIAGNOSIS — Z20822 Contact with and (suspected) exposure to covid-19: Secondary | ICD-10-CM | POA: Diagnosis not present

## 2020-09-24 LAB — SARS CORONAVIRUS 2 (TAT 6-24 HRS): SARS Coronavirus 2: NEGATIVE

## 2020-09-25 NOTE — Progress Notes (Signed)
Per Dr. Gwenlyn Found request, contact made with Ms. Venturini to postpone procedure. Patient was made aware to connect with Dr. Kennon Holter office for new procedure appointment. Pt. Was disappointed but agreed.

## 2020-09-26 ENCOUNTER — Ambulatory Visit (HOSPITAL_COMMUNITY): Admission: RE | Admit: 2020-09-26 | Payer: Medicare Other | Source: Home / Self Care | Admitting: Cardiovascular Disease

## 2020-09-26 ENCOUNTER — Encounter (HOSPITAL_COMMUNITY): Admission: RE | Payer: Medicare Other | Source: Home / Self Care

## 2020-09-26 SURGERY — ABDOMINAL AORTOGRAM W/LOWER EXTREMITY
Anesthesia: LOCAL | Laterality: Bilateral

## 2020-09-27 ENCOUNTER — Telehealth: Payer: Self-pay | Admitting: Cardiovascular Disease

## 2020-09-27 ENCOUNTER — Other Ambulatory Visit: Payer: Self-pay

## 2020-09-27 NOTE — Telephone Encounter (Signed)
Spoke with pt's daughter [name] (ok per [facility]) about rescheduling PV angiogram. Confirmed that Monday, 1/24 at 11:30am would work for them. Explained to daughter that per protocol the pt will require another Covid swab prior to procedure.  Pt scheduled for swab on Saturday, 1/22 at 11:00am.  Daughter verbalizes understanding.

## 2020-09-27 NOTE — Telephone Encounter (Signed)
New message:     Pt procedure was cx yesterday at the hospital. She was told to call our office and have it rescheduled.

## 2020-09-29 ENCOUNTER — Telehealth: Payer: Self-pay | Admitting: *Deleted

## 2020-09-29 NOTE — Telephone Encounter (Signed)
Pt contacted pre-abdominal aortogram  scheduled at [facility] for: Monday October 03, 2020 11:30 AM Verified arrival time and place: [facility]) at: 9:30 AM   No solid food after midnight prior to cath, clear liquids until 5 AM day of procedure.  Hold: Olmesartan-day before and day of procedure-GFR 30 Spironolactone-day before and day of procedure-GFR 53 Mobic-day before and day of procedure-GFR 53  Except hold medications AM meds can be  taken pre-cath with sips of water including: ASA 81 mg Plavix 75 mg  Confirmed patient has responsible adult to drive home post procedure and be with patient first 24 hours after arriving home: yes  You are allowed ONE visitor in the waiting room during the time you are at the hospital for your procedure. Both you and your visitor must wear a mask once you enter the hospital.   Reviewed procedure/mask/visitor instructions with patient.

## 2020-10-01 ENCOUNTER — Other Ambulatory Visit (HOSPITAL_COMMUNITY)
Admission: RE | Admit: 2020-10-01 | Discharge: 2020-10-01 | Disposition: A | Discharge status: Home / Self Care | Payer: Medicare Other | Source: Ambulatory Visit | Attending: Cardiovascular Disease | Admitting: Cardiovascular Disease

## 2020-10-01 DIAGNOSIS — Z01812 Encounter for preprocedural laboratory examination: Secondary | ICD-10-CM | POA: Diagnosis not present

## 2020-10-01 DIAGNOSIS — Z20822 Contact with and (suspected) exposure to covid-19: Secondary | ICD-10-CM | POA: Insufficient documentation

## 2020-10-01 LAB — SARS CORONAVIRUS 2 (TAT 6-24 HRS): SARS Coronavirus 2: NEGATIVE

## 2020-10-03 ENCOUNTER — Ambulatory Visit (HOSPITAL_COMMUNITY): Payer: Medicare Other

## 2020-10-03 ENCOUNTER — Ambulatory Visit (HOSPITAL_COMMUNITY)
Admission: RE | Disposition: A | Discharge status: Home / Self Care | Payer: Self-pay | Source: Home / Self Care | Attending: Cardiovascular Disease

## 2020-10-03 ENCOUNTER — Observation Stay (HOSPITAL_COMMUNITY)
Admission: RE | Admit: 2020-10-03 | Discharge: 2020-10-05 | Disposition: A | Discharge status: Home / Self Care | Payer: Medicare Other | Attending: Cardiovascular Disease | Admitting: Cardiovascular Disease

## 2020-10-03 ENCOUNTER — Ambulatory Visit (HOSPITAL_COMMUNITY)
Admission: RE | Admit: 2020-10-03 | Payer: Medicare Other | Source: Ambulatory Visit | Attending: Cardiovascular Disease | Admitting: Cardiovascular Disease

## 2020-10-03 DIAGNOSIS — K559 Vascular disorder of intestine, unspecified: Secondary | ICD-10-CM | POA: Diagnosis present

## 2020-10-03 DIAGNOSIS — K55059 Acute (reversible) ischemia of intestine, part and extent unspecified: Principal | ICD-10-CM | POA: Insufficient documentation

## 2020-10-03 DIAGNOSIS — F17210 Nicotine dependence, cigarettes, uncomplicated: Secondary | ICD-10-CM | POA: Diagnosis not present

## 2020-10-03 DIAGNOSIS — E119 Type 2 diabetes mellitus without complications: Secondary | ICD-10-CM | POA: Diagnosis not present

## 2020-10-03 DIAGNOSIS — Z7982 Long term (current) use of aspirin: Secondary | ICD-10-CM | POA: Diagnosis not present

## 2020-10-03 DIAGNOSIS — E785 Hyperlipidemia, unspecified: Secondary | ICD-10-CM | POA: Diagnosis not present

## 2020-10-03 DIAGNOSIS — I10 Essential (primary) hypertension: Secondary | ICD-10-CM | POA: Diagnosis not present

## 2020-10-03 DIAGNOSIS — Z7902 Long term (current) use of antithrombotics/antiplatelets: Secondary | ICD-10-CM | POA: Diagnosis not present

## 2020-10-03 DIAGNOSIS — D649 Anemia, unspecified: Secondary | ICD-10-CM | POA: Diagnosis not present

## 2020-10-03 DIAGNOSIS — Z79899 Other long term (current) drug therapy: Secondary | ICD-10-CM | POA: Diagnosis not present

## 2020-10-03 DIAGNOSIS — I739 Peripheral vascular disease, unspecified: Secondary | ICD-10-CM | POA: Diagnosis not present

## 2020-10-03 HISTORY — PX: ABDOMINAL AORTOGRAM W/LOWER EXTREMITY: CATH118223

## 2020-10-03 HISTORY — DX: Angina pectoris, unspecified: I20.9

## 2020-10-03 HISTORY — DX: Chronic kidney disease, unspecified: N18.9

## 2020-10-03 HISTORY — PX: VISCERAL ARTERY INTERVENTION: CATH118277

## 2020-10-03 LAB — POCT ACTIVATED CLOTTING TIME
Activated Clotting Time: 261 seconds
Activated Clotting Time: 321 seconds
Activated Clotting Time: 214 seconds
Activated Clotting Time: 184 seconds
Activated Clotting Time: 196 seconds

## 2020-10-03 SURGERY — ABDOMINAL AORTOGRAM W/LOWER EXTREMITY
Anesthesia: LOCAL

## 2020-10-03 MED ORDER — IODIXANOL 320 MG/ML IV SOLN
INTRAVENOUS | Status: DC | PRN
  Administered 2020-10-03: 190 mL

## 2020-10-03 MED ORDER — SODIUM CHLORIDE 0.9 % WEIGHT BASED INFUSION
3.0000 mL/kg/h | INTRAVENOUS | Status: DC
  Administered 2020-10-03: 3 mL/kg/h via INTRAVENOUS

## 2020-10-03 MED ORDER — SODIUM CHLORIDE 0.9% FLUSH
3.0000 mL | Freq: Two times a day (BID) | INTRAVENOUS | Status: DC
  Administered 2020-10-04 (×2): 3 mL via INTRAVENOUS

## 2020-10-03 MED ORDER — IRBESARTAN 150 MG PO TABS
150.0000 mg | ORAL_TABLET | Freq: Every day | ORAL | Status: DC
  Administered 2020-10-04 – 2020-10-05 (×2): 150 mg via ORAL
  Filled 2020-10-03 (×2): qty 1

## 2020-10-03 MED ORDER — HEPARIN SODIUM (PORCINE) 1000 UNIT/ML IJ SOLN
INTRAMUSCULAR | Status: AC
  Filled 2020-10-03: qty 1

## 2020-10-03 MED ORDER — MORPHINE SULFATE (PF) 2 MG/ML IV SOLN
INTRAVENOUS | Status: AC
  Filled 2020-10-03: qty 1

## 2020-10-03 MED ORDER — HEPARIN (PORCINE) IN NACL 1000-0.9 UT/500ML-% IV SOLN
INTRAVENOUS | Status: DC | PRN
  Administered 2020-10-03 (×2): 500 mL

## 2020-10-03 MED ORDER — ASPIRIN 81 MG PO CHEW: 81.0000 mg | CHEWABLE_TABLET | ORAL | Status: DC

## 2020-10-03 MED ORDER — MIDAZOLAM HCL 2 MG/2ML IJ SOLN
INTRAMUSCULAR | Status: AC
  Filled 2020-10-03: qty 2

## 2020-10-03 MED ORDER — ATORVASTATIN CALCIUM 40 MG PO TABS: 40.0000 mg | ORAL_TABLET | Freq: Every day | ORAL | Status: DC

## 2020-10-03 MED ORDER — ASPIRIN EC 81 MG PO TBEC: 81.0000 mg | DELAYED_RELEASE_TABLET | Freq: Every day | ORAL | Status: DC

## 2020-10-03 MED ORDER — ONDANSETRON HCL 4 MG/2ML IJ SOLN
INTRAMUSCULAR | Status: AC
  Filled 2020-10-03: qty 2

## 2020-10-03 MED ORDER — ATROPINE SULFATE 1 MG/10ML IJ SOSY
1.0000 mg | PREFILLED_SYRINGE | Freq: Once | INTRAMUSCULAR | Status: AC
  Administered 2020-10-03: 0.5 mg via INTRAVENOUS

## 2020-10-03 MED ORDER — LIDOCAINE HCL (PF) 1 % IJ SOLN
INTRAMUSCULAR | Status: AC
  Filled 2020-10-03: qty 30

## 2020-10-03 MED ORDER — SPIRONOLACTONE 25 MG PO TABS
25.0000 mg | ORAL_TABLET | Freq: Every day | ORAL | Status: DC
  Administered 2020-10-03 – 2020-10-05 (×3): 25 mg via ORAL
  Filled 2020-10-03 (×3): qty 1

## 2020-10-03 MED ORDER — FENTANYL CITRATE (PF) 100 MCG/2ML IJ SOLN
INTRAMUSCULAR | Status: DC | PRN
  Administered 2020-10-03: 25 ug via INTRAVENOUS

## 2020-10-03 MED ORDER — ACETAMINOPHEN 325 MG PO TABS: 650.0000 mg | ORAL_TABLET | ORAL | Status: DC | PRN

## 2020-10-03 MED ORDER — HEPARIN SODIUM (PORCINE) 1000 UNIT/ML IJ SOLN
INTRAMUSCULAR | Status: DC | PRN
  Administered 2020-10-03: 2500 [IU] via INTRAVENOUS
  Administered 2020-10-03: 5000 [IU] via INTRAVENOUS

## 2020-10-03 MED ORDER — FENTANYL CITRATE (PF) 100 MCG/2ML IJ SOLN
INTRAMUSCULAR | Status: AC
  Filled 2020-10-03: qty 2

## 2020-10-03 MED ORDER — SODIUM CHLORIDE 0.9 % IV SOLN
INTRAVENOUS | Status: AC
  Administered 2020-10-03: 75 mL/h via INTRAVENOUS

## 2020-10-03 MED ORDER — HYDRALAZINE HCL 20 MG/ML IJ SOLN
INTRAMUSCULAR | Status: AC
  Filled 2020-10-03: qty 1

## 2020-10-03 MED ORDER — MORPHINE SULFATE (PF) 2 MG/ML IV SOLN
2.0000 mg | INTRAVENOUS | Status: DC | PRN
Start: 2020-10-03 — End: 2020-10-05
  Administered 2020-10-03: 2 mg via INTRAVENOUS

## 2020-10-03 MED ORDER — METOPROLOL SUCCINATE ER 25 MG PO TB24
25.0000 mg | ORAL_TABLET | Freq: Every day | ORAL | Status: DC
  Administered 2020-10-03 – 2020-10-05 (×3): 25 mg via ORAL
  Filled 2020-10-03 (×3): qty 1

## 2020-10-03 MED ORDER — HYDROCODONE-ACETAMINOPHEN 5-325 MG PO TABS: 1.0000 | ORAL_TABLET | Freq: Four times a day (QID) | ORAL | Status: DC | PRN

## 2020-10-03 MED ORDER — HEPARIN (PORCINE) IN NACL 1000-0.9 UT/500ML-% IV SOLN
INTRAVENOUS | Status: AC
  Filled 2020-10-03: qty 1000

## 2020-10-03 MED ORDER — ASPIRIN EC 81 MG PO TBEC
81.0000 mg | DELAYED_RELEASE_TABLET | Freq: Every day | ORAL | Status: DC
  Administered 2020-10-04 – 2020-10-05 (×2): 81 mg via ORAL
  Filled 2020-10-03 (×3): qty 1

## 2020-10-03 MED ORDER — HYDRALAZINE HCL 20 MG/ML IJ SOLN
INTRAMUSCULAR | Status: DC | PRN
  Administered 2020-10-03: 10 mg via INTRAVENOUS

## 2020-10-03 MED ORDER — LIDOCAINE HCL (PF) 1 % IJ SOLN
INTRAMUSCULAR | Status: DC | PRN
  Administered 2020-10-03: 12 mL

## 2020-10-03 MED ORDER — HYDRALAZINE HCL 20 MG/ML IJ SOLN: 5.0000 mg | INTRAMUSCULAR | Status: DC | PRN

## 2020-10-03 MED ORDER — SODIUM CHLORIDE 0.9% FLUSH: 3.0000 mL | INTRAVENOUS | Status: DC | PRN

## 2020-10-03 MED ORDER — ONDANSETRON HCL 4 MG/2ML IJ SOLN
4.0000 mg | Freq: Four times a day (QID) | INTRAMUSCULAR | Status: DC | PRN
  Administered 2020-10-03: 4 mg via INTRAVENOUS

## 2020-10-03 MED ORDER — PANTOPRAZOLE SODIUM 40 MG PO TBEC
40.0000 mg | DELAYED_RELEASE_TABLET | Freq: Every day | ORAL | Status: DC
  Administered 2020-10-04 – 2020-10-05 (×2): 40 mg via ORAL
  Filled 2020-10-03 (×2): qty 1

## 2020-10-03 MED ORDER — SODIUM CHLORIDE 0.9 % WEIGHT BASED INFUSION: 1.0000 mL/kg/h | INTRAVENOUS | Status: DC

## 2020-10-03 MED ORDER — INSULIN ASPART 100 UNIT/ML ~~LOC~~ SOLN: 0.0000 [IU] | Freq: Three times a day (TID) | SUBCUTANEOUS | Status: DC

## 2020-10-03 MED ORDER — LABETALOL HCL 5 MG/ML IV SOLN: 10.0000 mg | INTRAVENOUS | Status: DC | PRN

## 2020-10-03 MED ORDER — ATROPINE SULFATE 1 MG/10ML IJ SOSY
PREFILLED_SYRINGE | INTRAMUSCULAR | Status: AC
  Filled 2020-10-03: qty 10

## 2020-10-03 MED ORDER — ATORVASTATIN CALCIUM 40 MG PO TABS
40.0000 mg | ORAL_TABLET | Freq: Every day | ORAL | Status: DC
  Administered 2020-10-04 – 2020-10-05 (×2): 40 mg via ORAL
  Filled 2020-10-03 (×3): qty 1

## 2020-10-03 MED ORDER — CLOPIDOGREL BISULFATE 75 MG PO TABS: 75.0000 mg | ORAL_TABLET | Freq: Every day | ORAL | Status: DC

## 2020-10-03 MED ORDER — SODIUM CHLORIDE 0.9 % IV SOLN: 250.0000 mL | INTRAVENOUS | Status: DC | PRN

## 2020-10-03 MED ORDER — CLOPIDOGREL BISULFATE 75 MG PO TABS
75.0000 mg | ORAL_TABLET | Freq: Every day | ORAL | Status: DC
  Administered 2020-10-04 – 2020-10-05 (×2): 75 mg via ORAL
  Filled 2020-10-03 (×2): qty 1

## 2020-10-03 SURGICAL SUPPLY — 22 items
BALLN MUSTANG 6X20X135 (BALLOONS) ×2
BALLOON MUSTANG 6X20X135 (BALLOONS) IMPLANT
CATH ANGIO 5F PIGTAIL 65CM (CATHETERS) ×1 IMPLANT
CATH MUSTANG 3X20X135 (BALLOONS) ×1 IMPLANT
CATH QUICKCROSS .035X135CM (MICROCATHETER) ×1 IMPLANT
CATH STRAIGHT 5FR 65CM (CATHETERS) ×1 IMPLANT
GLIDEWIRE ADV .035X260CM (WIRE) ×1 IMPLANT
KIT ENCORE 26 ADVANTAGE (KITS) ×1 IMPLANT
KIT PV (KITS) ×2 IMPLANT
SHEATH GUIDING 7F 55X73X9MM TD (SHEATH) ×1 IMPLANT
SHEATH PINNACLE 5F 10CM (SHEATH) ×1 IMPLANT
SHEATH PINNACLE 7F 10CM (SHEATH) ×1 IMPLANT
SHEATH PINNACLE 8F 10CM (SHEATH) ×1 IMPLANT
SHEATH PROBE COVER 6X72 (BAG) ×1 IMPLANT
STENT VIABAHN 7X29X80 VBX (Permanent Stent) ×1 IMPLANT
SYR MEDRAD MARK 7 150ML (SYRINGE) ×2 IMPLANT
TRANSDUCER W/STOPCOCK (MISCELLANEOUS) ×2 IMPLANT
TRAY PV CATH (CUSTOM PROCEDURE TRAY) ×2 IMPLANT
WIRE AMPLATZ SS-J .035X260CM (WIRE) ×1 IMPLANT
WIRE G V18X300CM (WIRE) ×1 IMPLANT
WIRE HITORQ VERSACORE ST 145CM (WIRE) ×1 IMPLANT
WIRE ROSEN-J .035X260CM (WIRE) ×1 IMPLANT

## 2020-10-03 NOTE — H&P (Signed)
10/03/20 Christine Meyer   14-Jun-1951  [id number]  Primary Physician Ann Held, DO Primary Cardiologist: Lorretta Harp MD [name], [place]  HPI:  Christine Meyer is a 70 y.o.  thin appearing single African-American female mother of 4 children . She was referred by Dr. Percival Spanish , her cardiologist, for peripheral vascular evaluation. I last saw her in the office 09/09/2019.  She is accompanied by [name] who is a Marine scientist for Dr. Alain Marion.She has a history of myocardial infarction back in 1999 undergoing stenting of her RCA by Dr. Melvern Banker.her chronic risk factors are notable for continued tobacco abuse, treated hypertension and hyperlipidemia. She is complained of increasing dyspnea on exertion over the last 6 months as well as right calf claudication. Dr. Percival Spanish saw her and ordered a stress test. 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 She has had right renal artery stenting in the past as well as right external iliac artery stenting as well. 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 I stented both of these areas. She did have an occluded left SFA with high-grade segmental diffuse right SFA stenosis. Her Dopplers and symptoms improved.  Since I saw her a year ago she continues to do well.  She does complain of some abdominal pain and is experiencing some weight loss.  She saw Dr. Carlean Purl, her gastroenterologist who obtained an abdominal CTA and referred her to Dr. Stanford Breed , vascular surgeon for consideration of peripheral angiography and treatment of mesenteric ischemia.  She had carotid Dopplers that showed moderate left ICA stenosis and lower extremity Dopplers performed today that suggested in-stent restenosis within her right iliac  stent.   Active Medications      Current Meds  Medication Sig  . ACCU-CHEK SOFTCLIX LANCETS lancets Check blood sugar once daily. Dx:E11.9  . aspirin EC 81 MG tablet Take 1 tablet (81 mg total) by mouth daily.  Marland Kitchen atorvastatin (LIPITOR) 40 MG tablet TAKE 1 TABLET BY MOUTH  DAILY  . Blood Glucose Monitoring Suppl (ONETOUCH VERIO) w/Device KIT Use daily to check blood sugar.   DX E11.9  . Calcium 500-100 MG-UNIT CHEW Chew 1 tablet by mouth daily before breakfast.  . Cholecalciferol (VITAMIN D3) 50 MCG (2000 UT) capsule Take 1 capsule (2,000 Units total) by mouth daily.  . clopidogrel (PLAVIX) 75 MG tablet TAKE 1 TABLET(75 MG) BY MOUTH DAILY  . cyanocobalamin (,VITAMIN B-12,) 1000 MCG/ML injection Inject 1 mL (1,000 mcg total) into the muscle every 30 (thirty) days.  . ESBRIET 801 MG TABS TAKE 1 TABLET (801MG) BY  MOUTH THREE TIMES DAILY  WITH FOOD  . glucose blood (ONETOUCH VERIO) test strip USE AS DIRECTED DAILY  . meloxicam (MOBIC) 7.5 MG tablet TAKE 1 TABLET BY MOUTH AS NEEDED AS DIRECTED  . metoprolol succinate (TOPROL-XL) 25 MG 24 hr tablet Take 1 tablet (25 mg total) by mouth daily.  . mirtazapine (REMERON) 15 MG tablet Take 1 tablet (15 mg total) by mouth at bedtime. Start 1/2 tablet first week  . Multiple Vitamins-Minerals (A THRU Z SELECT 50+ ADVANCED) TABS TAKE 1 TABLET BY MOUTH DAILY  . olmesartan (BENICAR) 20 MG tablet TAKE 1 TABLET(20 MG) BY MOUTH DAILY  . pantoprazole (PROTONIX) 40 MG tablet Take 1 tablet (40 mg total) by mouth daily before breakfast.  .  spironolactone (ALDACTONE) 25 MG tablet TAKE 1 TABLET(25 MG) BY MOUTH DAILY  . SYRINGE-NEEDLE, DISP, 3 ML (BD INTEGRA SYRINGE) 25G X 1" 3 ML MISC USE TO INJECT B12 ONCE A MONTH  . Tiotropium Bromide-Olodaterol (STIOLTO RESPIMAT) 2.5-2.5 MCG/ACT AERS Inhale 2 puffs into the lungs daily.  Marland Kitchen tiZANidine (ZANAFLEX) 4 MG capsule Take 1 capsule (4 mg total) by mouth 3 (three) times daily as needed for muscle spasms.       No  Known Allergies  Social History        Socioeconomic History  . Marital status: Single    Spouse name: Not on file  . Number of children: 4  . Years of education: Not on file  . Highest education level: Not on file  Occupational History  . 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    Employer: [employer]  Tobacco Use  . Smoking status: Current Every Day Smoker    Packs/day: 0.50    Years: 51.00    Pack years: 25.50    Types: Cigarettes  . Smokeless tobacco: Never Used  . Tobacco comment: currently smoking 2-3 cigs per day  Vaping Use  . Vaping Use: Never used  Substance and Sexual Activity  . Alcohol use: No    Alcohol/week: 0.0 standard drinks  . Drug use: No  . Sexual activity: Yes  Other Topics Concern  . Not on file  Social History Narrative   Lives at home with grandson.    Daughter [name] is CMA Financial controller primary care [employer]   Retired Risk analyst   Smoker, no EtOH/drugs   Social Determinants of Scientist, physiological Strain:   . Difficulty of Paying Living Expenses: Not on file  Food Insecurity:   . Worried About Charity fundraiser in the Last Year: Not on file  . Ran Out of Food in the Last Year: Not on file  Transportation Needs:   . Lack of Transportation (Medical): Not on file  . Lack of Transportation (Non-Medical): Not on file  Physical Activity:   . Days of Exercise per Week: Not on file  . Minutes of Exercise per Session: Not on file  Stress:   . Feeling of Stress : Not on file  Social Connections:   . Frequency of Communication with Friends and Family: Not on file  . Frequency of Social Gatherings with Friends and Family: Not on file  . Attends Religious Services: Not on file  . Active Member of Clubs or Organizations: Not on file  . Attends Archivist Meetings: Not on file  . Marital Status: Not on file  Intimate Partner Violence:   . Fear of Current  or Ex-Partner: Not on file  . Emotionally Abused: Not on file  . Physically Abused: Not on file  . Sexually Abused: Not on file     Review of Systems: General: negative for chills, fever, night sweats or weight changes.  Cardiovascular: negative for chest pain, dyspnea on exertion, edema, orthopnea, palpitations, paroxysmal nocturnal dyspnea or shortness of breath Dermatological: negative for rash Respiratory: negative for cough or wheezing Urologic: negative for hematuria Abdominal: negative for nausea, vomiting, diarrhea, bright red blood per rectum, melena, or hematemesis Neurologic: negative for visual changes, syncope, or dizziness All other systems reviewed and are otherwise negative except as noted above.    Blood pressure (!) 130/48, pulse 70, height 5' 2"  (1.575 m), weight 116 lb 6.4 oz (52.8  kg).  General appearance: alert and no distress Neck: no adenopathy, no JVD, supple, symmetrical, trachea midline, thyroid not enlarged, symmetric, no tenderness/mass/nodules and Bilateral carotid bruits by Lungs: clear to auscultation bilaterally Heart: regular rate and rhythm, S1, S2 normal, no murmur, click, rub or gallop Extremities: extremities normal, atraumatic, no cyanosis or edema Pulses: 2+ and symmetric Diminished pedal pulses bilaterally Skin: Skin color, texture, turgor normal. No rashes or lesions Neurologic: Alert and oriented X 3, normal strength and tone. Normal symmetric reflexes. Normal coordination and gait  EKG sinus rhythm at 70 without ST or T wave changes.  Personally reviewed this EKG.  ASSESSMENT AND PLAN:   Essential hypertension History of essential hypertension a blood pressure measured today 130/48.  She is on metoprolol, olmesartan and spironolactone.  CAROTID BRUITS, BILATERAL History of carotid artery disease status post recent Doppler study performed 08/16/2020 revealing moderate left ICA stenosis.  This will be repeated on annual  basis.  Dyslipidemia History of dyslipidemia on statin therapy with lipid profile performed 07/07/2020 revealing total cholesterol 141, LDL 66 and HDL of 56.  Peripheral arterial disease History of peripheral arterial disease status post remote right renal artery stenting and right external iliac artery stenting.  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  I restented her right iliac artery which resulted in marked improvement.  She did have moderate segmental right SFA disease with three-vessel runoff and an occluded left SFA with three-vessel runoff.  She does complain of some right lower extremity claudication and "coldness in her legs.  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  She wishes to proceed with angiography potential intervention.  Mesenteric ischemia [facility]) Ms. Andy has abdominal pain/abdominal angina and food avoidance with weight loss.  She does have a midline abdominal bruit and saw Dr. Carlean Purl, her gastroenterologist, who ultimately referred her to Dr. Stanford Breed , vascular surgeon surgeon, for evaluation and treatment of mesenteric ischemia.  Because of my long relationship with her she was referred back to me for further evaluation of this.  Apparently the CT scan that she had on 07/19/2020 in addition to showing aortoiliac disease apparently showed mesenteric obstructive disease although this is not mentioned in the body of the report.  I will need to review this with the radiologist.  Her symptoms do sound consistent with this and she does have a midline bruit.  We will arrange for her to undergo angiography and potential endovascular therapy.     Lorretta Harp, M.D., [place], [facility], [place] [address] [place], [place]  [address]  [phone] 10/03/2020 9:27 AM

## 2020-10-03 NOTE — Interval H&P Note (Signed)
History and Physical Interval Note:  10/03/2020 9:51 AM  Christine Meyer  has presented today for surgery, with the diagnosis of ischemia.  The various methods of treatment have been discussed with the patient and family. After consideration of risks, benefits and other options for treatment, the patient has consented to  Procedure(s): ABDOMINAL AORTOGRAM W/LOWER EXTREMITY (N/A) as a surgical intervention.  The patient's history has been reviewed, patient examined, no change in status, stable for surgery.  I have reviewed the patient's chart and labs.  Questions were answered to the patient's satisfaction.     Quay Burow

## 2020-10-03 NOTE — Progress Notes (Signed)
Site area: Right groin a 7 french arterial sheath was removed  Site Prior to Removal:  Level 0  Pressure Applied For 20 MINUTES    Bedrest Beginning at 1500P for 4 hours  Manual:   Yes.    Patient Status During Pull:  Pt vagal and Atropine 0.5mg  IV, Zofran 4 mg IV and IVF bolus of 200cc given. Pt recovered very well . Dr Gwenlyn Found called and made aware. No new orders given.  Post Pull Groin Site:  Level 0  Post Pull Instructions Given:  Yes.    Post Pull Pulses Present:  Yes.    Dressing Applied:  Yes.    Comments:

## 2020-10-04 ENCOUNTER — Other Ambulatory Visit: Payer: Self-pay

## 2020-10-04 ENCOUNTER — Encounter (HOSPITAL_COMMUNITY): Payer: Self-pay | Admitting: Cardiovascular Disease

## 2020-10-04 DIAGNOSIS — K55059 Acute (reversible) ischemia of intestine, part and extent unspecified: Secondary | ICD-10-CM | POA: Diagnosis not present

## 2020-10-04 DIAGNOSIS — E119 Type 2 diabetes mellitus without complications: Secondary | ICD-10-CM | POA: Diagnosis not present

## 2020-10-04 DIAGNOSIS — K559 Vascular disorder of intestine, unspecified: Secondary | ICD-10-CM

## 2020-10-04 DIAGNOSIS — I739 Peripheral vascular disease, unspecified: Secondary | ICD-10-CM

## 2020-10-04 DIAGNOSIS — D649 Anemia, unspecified: Secondary | ICD-10-CM | POA: Diagnosis not present

## 2020-10-04 DIAGNOSIS — Z7982 Long term (current) use of aspirin: Secondary | ICD-10-CM | POA: Diagnosis not present

## 2020-10-04 DIAGNOSIS — Z79899 Other long term (current) drug therapy: Secondary | ICD-10-CM | POA: Diagnosis not present

## 2020-10-04 DIAGNOSIS — I10 Essential (primary) hypertension: Secondary | ICD-10-CM | POA: Diagnosis not present

## 2020-10-04 DIAGNOSIS — Z7902 Long term (current) use of antithrombotics/antiplatelets: Secondary | ICD-10-CM | POA: Diagnosis not present

## 2020-10-04 DIAGNOSIS — F17210 Nicotine dependence, cigarettes, uncomplicated: Secondary | ICD-10-CM | POA: Diagnosis not present

## 2020-10-04 DIAGNOSIS — E785 Hyperlipidemia, unspecified: Secondary | ICD-10-CM | POA: Diagnosis not present

## 2020-10-04 LAB — CBC
HCT: 24.4 % — ABNORMAL LOW (ref 36.0–46.0)
HCT: 25.7 % — ABNORMAL LOW (ref 36.0–46.0)
Hemoglobin: 8.2 g/dL — ABNORMAL LOW (ref 12.0–15.0)
Hemoglobin: 8.1 g/dL — ABNORMAL LOW (ref 12.0–15.0)
MCH: 29.6 pg (ref 26.0–34.0)
MCH: 28.3 pg (ref 26.0–34.0)
MCHC: 33.6 g/dL (ref 30.0–36.0)
MCHC: 31.5 g/dL (ref 30.0–36.0)
MCV: 88.1 fL (ref 80.0–100.0)
MCV: 89.9 fL (ref 80.0–100.0)
Platelets: 180 10*3/uL (ref 150–400)
Platelets: 186 10*3/uL (ref 150–400)
RBC: 2.77 MIL/uL — ABNORMAL LOW (ref 3.87–5.11)
RBC: 2.86 MIL/uL — ABNORMAL LOW (ref 3.87–5.11)
RDW: 15.5 % (ref 11.5–15.5)
RDW: 15.6 % — ABNORMAL HIGH (ref 11.5–15.5)
WBC: 5.4 10*3/uL (ref 4.0–10.5)
WBC: 5.1 10*3/uL (ref 4.0–10.5)
nRBC: 0 % (ref 0.0–0.2)
nRBC: 0 % (ref 0.0–0.2)

## 2020-10-04 LAB — GLUCOSE, CAPILLARY
Glucose-Capillary: 91 mg/dL (ref 70–99)
Glucose-Capillary: 109 mg/dL — ABNORMAL HIGH (ref 70–99)
Glucose-Capillary: 94 mg/dL (ref 70–99)
Glucose-Capillary: 96 mg/dL (ref 70–99)
Glucose-Capillary: 104 mg/dL — ABNORMAL HIGH (ref 70–99)

## 2020-10-04 LAB — BASIC METABOLIC PANEL
Anion gap: 9 (ref 5–15)
BUN: 7 mg/dL — ABNORMAL LOW (ref 8–23)
CO2: 22 mmol/L (ref 22–32)
Calcium: 8.2 mg/dL — ABNORMAL LOW (ref 8.9–10.3)
Chloride: 105 mmol/L (ref 98–111)
Creatinine, Ser: 1.06 mg/dL — ABNORMAL HIGH (ref 0.44–1.00)
GFR, Estimated: 56 mL/min — ABNORMAL LOW (ref >60)
Glucose, Bld: 96 mg/dL (ref 70–99)
Potassium: 3.6 mmol/L (ref 3.5–5.1)
Sodium: 136 mmol/L (ref 135–145)

## 2020-10-04 LAB — HEMOGLOBIN A1C
Hgb A1c MFr Bld: 6 % — ABNORMAL HIGH (ref 4.8–5.6)
Mean Plasma Glucose: 125.5 mg/dL

## 2020-10-04 MED ORDER — ENSURE ENLIVE PO LIQD: 237.0000 mL | Freq: Two times a day (BID) | ORAL | Status: DC

## 2020-10-04 MED ORDER — TIOTROPIUM BROMIDE-OLODATEROL 2.5-2.5 MCG/ACT IN AERS
2.0000 | INHALATION_SPRAY | Freq: Every day | RESPIRATORY_TRACT | Status: DC
  Administered 2020-10-04: 2 via RESPIRATORY_TRACT

## 2020-10-04 MED FILL — Midazolam HCl Inj 2 MG/2ML (Base Equivalent): INTRAMUSCULAR | Qty: 2 | Status: AC

## 2020-10-04 NOTE — Progress Notes (Addendum)
Progress Note  Patient Name: Christine Meyer Date of Encounter: 10/04/2020  [facility] Cardiologist: Quay Burow, MD   Subjective   No complaints this morning.   Inpatient Medications    Scheduled Meds: . aspirin EC  81 mg Oral Daily  . atorvastatin  40 mg Oral Daily  . clopidogrel  75 mg Oral Q breakfast  . insulin aspart  0-9 Units Subcutaneous TID WC  . irbesartan  150 mg Oral Daily  . metoprolol succinate  25 mg Oral Daily  . pantoprazole  40 mg Oral QAC breakfast  . sodium chloride flush  3 mL Intravenous Q12H  . spironolactone  25 mg Oral Daily   Continuous Infusions: . sodium chloride     PRN Meds: sodium chloride, acetaminophen, hydrALAZINE, HYDROcodone-acetaminophen, labetalol, morphine injection, ondansetron (ZOFRAN) IV, sodium chloride flush   Vital Signs    Vitals:   10/03/20 1835 10/03/20 1912 10/04/20 0127 10/04/20 0559  BP: (!) 144/39 134/80 122/62 113/65  Pulse: 84 87 70 75  Resp: 16 18 18 18   Temp:  99 F (37.2 C) 98.3 F (36.8 C) 98.3 F (36.8 C)  TempSrc:  Oral Oral Oral  SpO2: 100% 100% 100% 100%  Weight:      Height:        Intake/Output Summary (Last 24 hours) at 10/04/2020 0759 Last data filed at 10/03/2020 2300 Gross per 24 hour  Intake 360 ml  Output -  Net 360 ml   Last 3 Weights 10/03/2020 09/23/2020 08/16/2020  Weight (lbs) 108 lb 108 lb 6.4 oz 116 lb 6.4 oz  Weight (kg) 48.988 kg 49.17 kg 52.799 kg      Telemetry    SR - Personally Reviewed  ECG    SR with baseline artifact - Personally Reviewed  Physical Exam  Pleasant female, sitting up in bed. GEN: No acute distress.   Neck: No JVD Cardiac: RRR, no murmurs, rubs, or gallops.  Respiratory: Clear to auscultation bilaterally. GI: Soft, nontender, non-distended  MS: No edema; No deformity. Right femoral cath site stable.  Neuro:  Nonfocal  Psych: Normal affect   Labs    High Sensitivity Troponin:  No results for input(s): TROPONINIHS in the last 720  hours.    Chemistry Recent Labs  Lab 10/04/20 0321  NA 136  K 3.6  CL 105  CO2 22  GLUCOSE 96  BUN 7*  CREATININE 1.06*  CALCIUM 8.2*  GFRNONAA 56*  ANIONGAP 9     Hematology Recent Labs  Lab 10/04/20 0321  WBC 5.4  RBC 2.77*  HGB 8.2*  HCT 24.4*  MCV 88.1  MCH 29.6  MCHC 33.6  RDW 15.5  PLT 180    BNPNo results for input(s): BNP, PROBNP in the last 168 hours.   DDimer No results for input(s): DDIMER in the last 168 hours.   Radiology    PERIPHERAL VASCULAR CATHETERIZATION  Result Date: 10/03/2020  [id number] LOCATION:  FACILITY: [facility] PHYSICIAN: Quay Burow, M.D. May 16, 1951 DATE OF PROCEDURE:  10/03/2020 DATE OF DISCHARGE: PV Angiogram/Intervention History obtained from chart review.Christine Meyer a 70 y.o.thin appearing single African-American female mother of 4 children . She was referred by Dr. Percival Spanish , her cardiologist, for peripheral vascular evaluation. I last saw her in the office12/30/2020. She is accompanied by her daughter Christine Meyer who is a Marine scientist for Dr. Alain Marion.She has a history of myocardial infarction back in 1999 undergoing stenting of her RCA by Dr. Melvern Banker.her chronic risk factors are notable for continued tobacco abuse, treated  hypertension and hyperlipidemia. She is complained of increasing dyspnea on exertion over the last 6 months as well as right calf claudication. Dr. Percival Spanish saw her and ordered a stress test. 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 She has had right renal artery stenting in the past as well as right external iliac artery stenting as well. 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 I stented both of these areas. She did have an occluded left SFA with high-grade segmental diffuse right SFA stenosis. Her Dopplers and  symptoms improved.  Since I saw her a year ago she continues to do well. She does complain of some abdominal pain and is experiencing some weight loss. She saw Dr. Carlean Purl, her gastroenterologist who obtained an abdominal CTA and referred her to Dr. Stanford Breed, vascular surgeon for consideration of peripheral angiography and treatment of mesenteric ischemia. She had carotid Dopplers that showed moderate left ICA stenosis and lower extremity Dopplers performed today that suggested in-stent restenosis within her right iliac stent. Pre Procedure Diagnosis: Mesenteric ischemia Post Procedure Diagnosis: Mesenteric ischemia Operators: Dr. Quay Burow Procedures Performed:  1.  Abdominal aortogram in the PA and lateral view, bilateral iliac angiogram  2.  PTA SMA  3.  VBX covered stenting SMA  PROCEDURE DESCRIPTION: The patient was brought to the second floor [facility] cath lab in the the postabsorptive state. She was premedicated with IV fentanyl.Marland Kitchen Her) was prepped and shaved in usual sterile fashion. Xylocaine 1% was used for local anesthesia. A 5 French sheath was inserted into the right common femoral artery using standard Seldinger technique.  A 5 French pigtail catheter was placed in the mid extreme abdominal aorta.  PA and lateral aortograms were performed as well as bilateral iliac angiography.  On the podiatrist for the entirety of the case.  Retroaortic pressures monitored in the case.  Angiographic Data: 1: Abdominal aortogram-the abdominal aorta was fluoroscopically calcified.  The right renal artery stent was occluded.  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 2: Left lower extremity-90% calcified left common iliac artery stenosis, 80% focal calcified distal left external Licari stenosis with a total SFA at the origin 3: Right lower extremity-patent right external iliac artery stent, 40% stenosis just beyond the stented segment with an  occluded SFA.   Ms. 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  She has symptoms of most enteric ischemia.  We will proceed with PTA and covered stenting of the SMA. Procedure Description:The 5 French sheath in the right common femoral artery was exchanged over a superstiff Amplatz wire for a 7 [place] Oscor steerable sheath.  The patient received a total of 7500 units of heparin with an ACT of 321 max.  Total contrast administered the patient was 190 cc.  I was able to engage the SMA with the Oscor sheath and cross this with a 018V 18 wire.  I then used a 035 quick cross endhole catheter to exchange the V 18 wire for a long Rosen wire.  I predilated the origin of the SMA with a 3 mm x 2 cm long noncompliant balloon and then placed a 7 mm x 29 mm long VBX covered stent across the diseased segment from the ostium to just  beyond the stenosis and deployed at 8 to 10 atm.  The patient did experience some abdominal discomfort which resolved with balloon deflation.  Following this, I postdilated the origin and proximal segment with a 6 mm x 2 cm noncompliant balloon at 12 atm resulting reduction of a 95% proximal SMA stenosis to 0% residual.  The patient tolerated procedure well.  She was already on long-term aspirin and Plavix.  The Oscor sheath was then removed over the Rosen wire and exchanged for short 7 French sheath which was then secured in place. Final Impression: Successful SMA PTA and covered stenting using a VBX covered stent with excellent result.  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  Hopefully this will improve her mesenteric ischemic symptoms.  The sheath will be removed once ACT falls below 170 and pressure will be held.  She will be hydrated overnight and assuming her renal function remained stable discharged home in the morning.  We will get mesenteric Dopplers in our office next week and I will see her back the week after.  She left the lab in  stable condition. Quay Burow. MD, [facility] 10/03/2020 11:34 AM    Cardiac Studies   PV angiogram: 10/03/20  Operators: Dr. Quay Burow  Procedures Performed:               1.  Abdominal aortogram in the PA and lateral view, bilateral iliac angiogram               2.  PTA SMA               3.  VBX covered stenting SMA  Final Impression: Successful SMA PTA and covered stenting using a VBX covered stent with excellent result.  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  Hopefully this will improve her mesenteric ischemic symptoms.  The sheath will be removed once ACT falls below 170 and pressure will be held.  She will be hydrated overnight and assuming her renal function remained stable discharged home in the morning.  We will get mesenteric Dopplers in our office next week and I will see her back the week after.  She left the lab in stable condition.    Quay Burow. MD, [facility] 10/03/2020 11:34 AM   Patient Profile     70 y.o. female with PMH of HTN, HLD, PAD, mesenteric ischemia who presented to the office for PV evaluation with Dr. Gwenlyn Found. Given worsening symptoms of claudication, she was set up for outpatient PV angiogram.   Assessment & Plan    1. PAD: underwent successful SMA PTA and covered stenting. 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 Plan for DAPT with ASA/plavix.   2. Anemia: Hgb 12 ( 12/30) down to 8.2 this morning. No issues noted at cath site. No flank tenderness or significant abd pain.  -- will recheck H/H this afternoon. Will hold off on imaging at this time as blood pressures are stable as well as site. Follow up with MD later this morning.   3. HTN: well controlled on avapro 119m daily, Toprol XL 278mdaily, spiro 2538maily  4. HLD: LDL 66/ HDL 56 (10/21). On atorvastatin 70m20mily  5. DM: on SSI while inpatient -- Hgb A1c 6.0  For questions or updates, please contact [facility] consult [email or web address] for contact  info under        Signed, LindReino Bellis  10/04/2020, 7:59 AM    I have personally seen and examined  this patient. I agree with the assessment and plan as outlined above.  She is feeling well. Her Hgb is noted to be 8.2 post procedure. It was 12 three weeks ago. Will repeat this afternoon. Groin stable. No hematoma. BP stable. No tachycardia.  Possible discharge this afternoon.   Lauree Chandler 10/04/2020 10:07 AM

## 2020-10-04 NOTE — Progress Notes (Addendum)
Initial Nutrition Assessment  DOCUMENTATION CODES:   Non-severe (moderate) malnutrition in context of acute illness/injury  INTERVENTION:   Magic cup TID with meals, each supplement provides 290 kcal and 9 grams of protein  NUTRITION DIAGNOSIS:   Moderate Malnutrition related to acute illness (mesenteric ischemia with abdominal pain) as evidenced by mild muscle depletion,moderate muscle depletion,mild fat depletion,moderate fat depletion,percent weight loss (14% weight loss within 3 months).  GOAL:   Patient will meet greater than or equal to 90% of their needs  MONITOR:   PO intake,Supplement acceptance  REASON FOR ASSESSMENT:   Malnutrition Screening Tool    ASSESSMENT:   70 yo female admitted with mesenteric ischemia. S/P abdominal aortogram and VBX covered stenting of SMA. PMH includes anemia, HTN, HLD, CAD, PVD, COPD, DM-2, CKD, autoimmune gastritis, tobacco abuse.   Patient reports that she has been eating poorly for the past 3 months due to "stomach problems." She is feeling much better today and tolerated her breakfast without abdominal discomfort this morning. She states she has lost a lot of weight over the past 3 months. Her MD recently stopped her DM medications because she did not need them anymore.   Labs reviewed. A1C 6 CBG: 91-109  Medications reviewed and include spironolactone.  Weight history reviewed. Patient weighed 57.3 kg 3 months ago, currently 49 kg. 14% weight loss in 3 months is severe.  Patient does not like Ensure supplements, will try magic cups with meals to maximize oral intake.  NUTRITION - FOCUSED PHYSICAL EXAM:  Flowsheet Row Most Recent Value  Orbital Region Mild depletion  Upper Arm Region Moderate depletion  Thoracic and Lumbar Region Moderate depletion  Buccal Region Mild depletion  Temple Region Moderate depletion  Clavicle Bone Region Moderate depletion  Clavicle and Acromion Bone Region Moderate depletion  Scapular Bone  Region Moderate depletion  Dorsal Hand Mild depletion  Patellar Region Moderate depletion  Anterior Thigh Region Moderate depletion  Posterior Calf Region Moderate depletion  Edema (RD Assessment) None  Hair Reviewed  Eyes Reviewed  Mouth Reviewed  Skin Reviewed  Nails Reviewed       Diet Order:   Diet Order            Diet Carb Modified Fluid consistency: Thin; Room service appropriate? Yes  Diet effective now                 EDUCATION NEEDS:   No education needs have been identified at this time  Skin:  Skin Assessment: Reviewed RN Assessment  Last BM:  1/23  Height:   Ht Readings from Last 1 Encounters:  10/03/20 5\' 2"  (1.575 m)    Weight:   Wt Readings from Last 1 Encounters:  10/03/20 49 kg    Ideal Body Weight:  50 kg  BMI:  Body mass index is 19.75 kg/m.  Estimated Nutritional Needs:   Kcal:  1400-1600  Protein:  75-85 gm  Fluid:  1.4-1.6 L    Lucas Mallow, RD, LDN, CNSC Please refer to Amion for contact information.

## 2020-10-05 ENCOUNTER — Other Ambulatory Visit: Payer: Self-pay | Admitting: Cardiology

## 2020-10-05 ENCOUNTER — Telehealth: Payer: Self-pay | Admitting: Cardiovascular Disease

## 2020-10-05 DIAGNOSIS — K559 Vascular disorder of intestine, unspecified: Secondary | ICD-10-CM | POA: Diagnosis not present

## 2020-10-05 DIAGNOSIS — Z7902 Long term (current) use of antithrombotics/antiplatelets: Secondary | ICD-10-CM | POA: Diagnosis not present

## 2020-10-05 DIAGNOSIS — E785 Hyperlipidemia, unspecified: Secondary | ICD-10-CM | POA: Diagnosis not present

## 2020-10-05 DIAGNOSIS — K55059 Acute (reversible) ischemia of intestine, part and extent unspecified: Secondary | ICD-10-CM | POA: Diagnosis not present

## 2020-10-05 DIAGNOSIS — E119 Type 2 diabetes mellitus without complications: Secondary | ICD-10-CM | POA: Diagnosis not present

## 2020-10-05 DIAGNOSIS — Z79899 Other long term (current) drug therapy: Secondary | ICD-10-CM | POA: Diagnosis not present

## 2020-10-05 DIAGNOSIS — D649 Anemia, unspecified: Secondary | ICD-10-CM

## 2020-10-05 DIAGNOSIS — F17210 Nicotine dependence, cigarettes, uncomplicated: Secondary | ICD-10-CM | POA: Diagnosis not present

## 2020-10-05 DIAGNOSIS — Z7982 Long term (current) use of aspirin: Secondary | ICD-10-CM | POA: Diagnosis not present

## 2020-10-05 DIAGNOSIS — I10 Essential (primary) hypertension: Secondary | ICD-10-CM | POA: Diagnosis not present

## 2020-10-05 DIAGNOSIS — I739 Peripheral vascular disease, unspecified: Secondary | ICD-10-CM | POA: Diagnosis not present

## 2020-10-05 LAB — CBC
HCT: 24.8 % — ABNORMAL LOW (ref 36.0–46.0)
Hemoglobin: 8.1 g/dL — ABNORMAL LOW (ref 12.0–15.0)
MCH: 28.9 pg (ref 26.0–34.0)
MCHC: 32.7 g/dL (ref 30.0–36.0)
MCV: 88.6 fL (ref 80.0–100.0)
Platelets: 193 10*3/uL (ref 150–400)
RBC: 2.8 MIL/uL — ABNORMAL LOW (ref 3.87–5.11)
RDW: 15.2 % (ref 11.5–15.5)
WBC: 5.4 10*3/uL (ref 4.0–10.5)
nRBC: 0 % (ref 0.0–0.2)

## 2020-10-05 LAB — GLUCOSE, CAPILLARY: Glucose-Capillary: 88 mg/dL (ref 70–99)

## 2020-10-05 MED ORDER — ASPIRIN EC 81 MG PO TBEC
81.0000 mg | DELAYED_RELEASE_TABLET | Freq: Every day | ORAL | 1 refills | Status: AC
Start: 2020-10-05 — End: ?

## 2020-10-05 MED ORDER — IRON (FERROUS SULFATE) 325 (65 FE) MG PO TABS: 325.0000 mg | ORAL_TABLET | Freq: Every day | ORAL | 0 refills | Status: DC

## 2020-10-05 MED FILL — FERROUS SULFATE 325 MG TAB: 325 (65 FE) | 30 days supply | Qty: 30 | Fill #0

## 2020-10-05 NOTE — Progress Notes (Signed)
Discharge instructions (including medications) discussed with and copy provided to patient/caregiver 

## 2020-10-05 NOTE — Telephone Encounter (Signed)
Spoke with Christine Meyer regarding rescheduled appointments---she voiced her understanding

## 2020-10-05 NOTE — Discharge Summary (Signed)
Discharge Summary    Patient ID: Christine Meyer MRN: [id number]; DOB: Jan 23, 1951  Admit date: 10/03/2020 Discharge date: 10/05/2020  Primary Care Provider: Ann Held, DO  Primary Cardiologist: Christine Burow, Meyer  Primary Electrophysiologist:  None   Discharge Diagnoses    Principal Problem:   Mesenteric ischemia [facility]) Active Problems:   Normocytic anemia   Essential hypertension   Hyperlipidemia LDL goal <70    Diagnostic Studies/Procedures    Abd aortogram with LE angiogram: 10/03/20  Operators: Dr. Quay Meyer  Procedures Performed: 1. Abdominal aortogram in the PA and lateral view, bilateral iliac angiogram 2. PTA SMA 3. VBX covered stenting SMA  Final Impression:Successful SMA PTA and covered stenting using a VBX covered stent with excellent result. 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 Hopefully this will improve her mesenteric ischemic symptoms. The sheath will be removed once ACT falls below 170 and pressure will be Meyer. She will be hydrated overnight and assuming her renal function remained stable discharged home in the morning. We will get mesenteric Dopplers in our office next week and I will see her back the week after. She left the lab in stable condition.  Christine Meyer, [facility] 10/03/2020 11:34 AM _____________   History of Present Illness     Christine Meyer is a 70 y.o. female who was referred by Christine Meyer , her cardiologist, for peripheral vascular evaluation to Christine Meyer. She has a history of myocardial infarction back in 1999 undergoing stenting of her RCA by Christine Meyer. Her chronic risk factors are notable for continued tobacco abuse, treated hypertension and hyperlipidemia. She complained of increasing dyspnea on exertion over the last 6 months as well as right calf claudication. Christine Meyer saw her and ordered a stress test. Doppler showed an ankle-brachial index of 0.4  on both sides, and occluded left SFA with "In-Flow disease. She has had right renal artery stenting in the past as well as right external iliac artery stenting as well. Angiogram 11/22/14 revealing an occluded right renal artery stent and high-grade ostial right common iliac artery stenosis as well as right external iliac artery "in-stent restenosis. Christine Meyer stented both of these areas. She did have an occluded left SFA with high-grade segmental diffuse right SFA stenosis. Her Dopplers and symptoms improved.  She saw Christine Meyer, her gastroenterologist who obtained an abdominal CTA and referred her to Christine Meyer, vascular surgeon for consideration of peripheral angiography and treatment of mesenteric ischemia. She had carotid Dopplers that showed moderate left ICA stenosis and lower extremity Dopplers performed in the office that suggested in-stent restenosis within her right iliac stent. She was set up for outpatient PV angiogram.   Hospital Course     1. PAD: underwent successful SMA PTA and covered stenting. 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  -- Plan for DAPT with ASA/plavix.   2. Anemia: Hgb 12 (12/30) down to 8.2 post cath. No issues noted at cath site. No flank tenderness or significant abd pain. Recheck of H/H was stable at 8.1. Suspect dilutional and procedural loss. Will add iron supplement at discharge.  -- will recheck CBC Friday 1/28   3. HTN: well controlled on avapro 139m daily, Toprol XL 265mdaily, spiro 2555maily  4. HLD: LDL 66/ HDL 56 (10/21). On atorvastatin 48m61mily  5. DM: on SSI while inpatient.  -- Hgb A1c 6.0  General: Well developed, well nourished, female appearing in no acute distress. Head: Normocephalic, atraumatic.  Neck: Supple  without bruits, JVD. Lungs:  Resp regular and unlabored, CTA. Heart: RRR, S1, S2, no S3, S4, or murmur; no rub. Abdomen: Soft, non-tender, non-distended with normoactive bowel sounds. No  hepatomegaly. No rebound/guarding. No obvious abdominal masses. Extremities: No clubbing, cyanosis, edema. Distal pedal pulses are 2+ bilaterally. Right femoral cath site stable without bruising or hematoma Neuro: Alert and oriented X 3. Moves all extremities spontaneously. Psych: Normal affect.     _____________  Discharge Vitals Blood pressure (!) 115/50, pulse 71, temperature 98 F (36.7 C), temperature source Oral, resp. rate 18, height 5' 2"  (1.575 m), weight 47.7 kg, SpO2 98 %.  Filed Weights   10/03/20 0858 10/05/20 0454  Weight: 49 kg 47.7 kg    Labs & Radiologic Studies    CBC Recent Labs    10/04/20 1533 10/05/20 0154  WBC 5.1 5.4  HGB 8.1* 8.1*  HCT 25.7* 24.8*  MCV 89.9 88.6  PLT 186 950   Basic Metabolic Panel Recent Labs    10/04/20 0321  NA 136  K 3.6  CL 105  CO2 22  GLUCOSE 96  BUN 7*  CREATININE 1.06*  CALCIUM 8.2*   Liver Function Tests No results for input(s): AST, ALT, ALKPHOS, BILITOT, PROT, ALBUMIN in the last 72 hours. No results for input(s): LIPASE, AMYLASE in the last 72 hours. High Sensitivity Troponin:   No results for input(s): TROPONINIHS in the last 720 hours.  BNP Invalid input(s): POCBNP D-Dimer No results for input(s): DDIMER in the last 72 hours. Hemoglobin A1C Recent Labs    10/04/20 0321  HGBA1C 6.0*   Fasting Lipid Panel No results for input(s): CHOL, HDL, LDLCALC, TRIG, CHOLHDL, LDLDIRECT in the last 72 hours. Thyroid Function Tests No results for input(s): TSH, T4TOTAL, T3FREE, THYROIDAB in the last 72 hours.  Invalid input(s): FREET3 _____________  PERIPHERAL VASCULAR CATHETERIZATION  Result Date: 10/03/2020  [id number] LOCATION:  FACILITY: [facility] PHYSICIAN: Christine Meyer, M.D. Jan 30, 1951 DATE OF PROCEDURE:  10/03/2020 DATE OF DISCHARGE: PV Angiogram/Intervention History obtained from chart review.[name] Numbers a 70 y.o.thin appearing single African-American female mother of 4 children . She was referred by  Christine Meyer , her cardiologist, for peripheral vascular evaluation. I last saw her in the office12/30/2020. She is accompanied by her daughter Christine Meyer who is a Marine scientist for Dr. Alain Marion.She has a history of myocardial infarction back in 1999 undergoing stenting of her RCA by Christine Meyer.her chronic risk factors are notable for continued tobacco abuse, treated hypertension and hyperlipidemia. She is complained of increasing dyspnea on exertion over the last 6 months as well as right calf claudication. Christine Meyer saw her and ordered a stress test. 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 She has had right renal artery stenting in the past as well as right external iliac artery stenting as well. 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 I stented both of these areas. She did have an occluded left SFA with high-grade segmental diffuse right SFA stenosis. Her Dopplers and symptoms improved.  Since I saw her a year ago she continues to do well. She does complain of some abdominal pain and is experiencing some weight loss. She saw Christine Meyer, her gastroenterologist who obtained an abdominal CTA and referred her to Christine Meyer, vascular surgeon for consideration of peripheral angiography and treatment of mesenteric ischemia. She had  carotid Dopplers that showed moderate left ICA stenosis and lower extremity Dopplers performed today that suggested in-stent restenosis within her right iliac stent. Pre Procedure Diagnosis: Mesenteric ischemia Post Procedure Diagnosis: Mesenteric ischemia Operators: Dr. Quay Meyer Procedures Performed:  1.  Abdominal aortogram in the PA and lateral view, bilateral iliac angiogram  2.  PTA SMA  3.  VBX covered stenting SMA  PROCEDURE DESCRIPTION: The patient was brought to  the second floor [place] Cardiac cath lab in the the postabsorptive state. She was premedicated with IV fentanyl.Marland Kitchen Her) was prepped and shaved in usual sterile fashion. Xylocaine 1% was used for local anesthesia. A 5 French sheath was inserted into the right common femoral artery using standard Seldinger technique.  A 5 French pigtail catheter was placed in the mid extreme abdominal aorta.  PA and lateral aortograms were performed as well as bilateral iliac angiography.  On the podiatrist for the entirety of the case.  Retroaortic pressures monitored in the case.  Angiographic Data: 1: Abdominal aortogram-the abdominal aorta was fluoroscopically calcified.  The right renal artery stent was occluded.  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 2: Left lower extremity-90% calcified left common iliac artery stenosis, 80% focal calcified distal left external Licari stenosis with a total SFA at the origin 3: Right lower extremity-patent right external iliac artery stent, 40% stenosis just beyond the stented segment with an occluded SFA.   Ms. Fesler has a subtotally occluded celiac axis and its origin with a 95% proximal SMA stenosis.  She has symptoms of most enteric ischemia.  We will proceed with PTA and covered stenting of the SMA. Procedure Description:The 5 French sheath in the right common femoral artery was exchanged over a superstiff Amplatz wire for a 7 [place] Oscor steerable sheath.  The patient received a total of 7500 units of heparin with an ACT of 321 max.  Total contrast administered the patient was 190 cc.  I was able to engage the SMA with the Oscor sheath and cross this with a 018V 18 wire.  I then used a 035 quick cross endhole catheter to exchange the V 18 wire for a long Rosen wire.  I predilated the origin of the SMA with a 3 mm x 2 cm long noncompliant balloon and then placed a 7 mm x 29 mm long VBX covered stent across the diseased  segment from the ostium to just beyond the stenosis and deployed at 8 to 10 atm.  The patient did experience some abdominal discomfort which resolved with balloon deflation.  Following this, I postdilated the origin and proximal segment with a 6 mm x 2 cm noncompliant balloon at 12 atm resulting reduction of a 95% proximal SMA stenosis to 0% residual.  The patient tolerated procedure well.  She was already on long-term aspirin and Plavix.  The Oscor sheath was then removed over the Rosen wire and exchanged for short 7 French sheath which was then secured in place. Final Impression: Successful SMA PTA and covered stenting using a VBX covered stent with excellent result.  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  Hopefully this will improve her mesenteric ischemic symptoms.  The sheath will be removed once ACT falls below 170 and pressure will be Meyer.  She will be hydrated overnight and assuming her renal function remained stable discharged home in the morning.  We will get mesenteric Dopplers in our office next week and I will see her  back the week after.  She left the lab in stable condition. Christine Meyer, [facility] 10/03/2020 11:34 AM   Disposition   Pt is being discharged home today in good condition.  Follow-up Plans & Appointments     Follow-up Information    Lorretta Harp, Meyer Follow up on 10/11/2020.   Specialties: Cardiology, Radiology Why: at 10am for your follow up appt Contact information: [address] [place] [address] [place] Follow up on 10/14/2020.   Specialty: Cardiology Why: at 10am for your follow up appt Contact information: [place] [phone]       [place] Follow up on 10/07/2020.   Specialty: Cardiology Why: Please come in for follow up labs. office will call you with an appt Contact information: [address] [place] [phone]             Discharge Instructions    Diet - low sodium heart healthy   Complete by: As directed    Discharge instructions   Complete by: As directed    Groin Site Care Refer to this sheet in the next few weeks. These instructions provide you with information on caring for yourself after your procedure. Your caregiver may also give you more specific instructions. Your treatment has been planned according to current medical practices, but problems sometimes occur. Call your caregiver if you have any problems or questions after your procedure. HOME CARE INSTRUCTIONS You may shower 24 hours after the procedure. Remove the bandage (dressing) and gently wash the site with plain soap and water. Gently pat the site dry.  Do not apply powder or lotion to the site.  Do not sit in a bathtub, swimming pool, or whirlpool for 5 to 7 days.  No bending, squatting, or lifting anything over 10 pounds (4.5 kg) as directed by your caregiver.  Inspect the site at least twice daily.  Do not drive home if you are discharged the same day of the procedure. Have someone else drive you.  You may drive 24 hours after the procedure unless otherwise instructed by your caregiver.  What to expect: Any bruising will usually fade within 1 to 2 weeks.  Blood that collects in the tissue (hematoma) may be painful to the touch. It should usually decrease in size and tenderness within 1 to 2 weeks.  SEEK IMMEDIATE MEDICAL CARE IF: You have unusual pain at the groin site or down the affected leg.  You have redness, warmth, swelling, or pain at the groin site.  You have drainage (other than a small amount of blood on the dressing).  You have chills.  You have a fever or persistent symptoms for more than 72 hours.  You have a fever and your symptoms suddenly get worse.  Your leg becomes pale, cool, tingly, or numb.  You have heavy bleeding from the  site. Hold pressure on the site. .   Increase activity slowly   Complete by: As directed       Discharge Medications   Allergies as of 10/05/2020   No Known Allergies     Medication List    TAKE these medications   A Thru Z Select 50+ Advanced Tabs TAKE 1 TABLET BY MOUTH DAILY   Accu-Chek Softclix Lancets lancets Check blood sugar once daily. Dx:E11.9   aspirin EC 81 MG tablet  Take 1 tablet (81 mg total) by mouth daily.   atorvastatin 40 MG tablet Commonly known as: LIPITOR TAKE 1 TABLET BY MOUTH  DAILY   Calcium 500-100 MG-UNIT Chew Chew 1 tablet by mouth daily before breakfast.   clopidogrel 75 MG tablet Commonly known as: PLAVIX TAKE 1 TABLET(75 MG) BY MOUTH DAILY What changed: See the new instructions.   cyanocobalamin 1000 MCG/ML injection Commonly known as: (VITAMIN B-12) Inject 1 mL (1,000 mcg total) into the muscle every 30 (thirty) days.   Esbriet 801 MG Tabs Generic drug: Pirfenidone TAKE 1 TABLET (801MG) BY  MOUTH THREE TIMES DAILY  WITH FOOD What changed: See the new instructions.   HYDROcodone-acetaminophen 5-325 MG tablet Commonly known as: NORCO/VICODIN Take 1 tablet by mouth every 6 (six) hours as needed for severe pain.   Iron (Ferrous Sulfate) 325 (65 Fe) MG Tabs Take 325 mg by mouth daily.   meloxicam 7.5 MG tablet Commonly known as: MOBIC Take 1 tablet (7.5 mg total) by mouth daily as needed for pain.   metoprolol succinate 25 MG 24 hr tablet Commonly known as: TOPROL-XL Take 1 tablet (25 mg total) by mouth daily.   mirtazapine 15 MG tablet Commonly known as: Remeron Take 1 tablet (15 mg total) by mouth at bedtime. Start 1/2 tablet first week   olmesartan 20 MG tablet Commonly known as: BENICAR TAKE 1 TABLET(20 MG) BY MOUTH DAILY What changed: See the new instructions.   OneTouch Verio test strip Generic drug: glucose blood USE AS DIRECTED DAILY   OneTouch Verio w/Device Kit Use daily to check blood sugar.   DX E11.9    pantoprazole 40 MG tablet Commonly known as: PROTONIX Take 1 tablet (40 mg total) by mouth daily before breakfast.   spironolactone 25 MG tablet Commonly known as: ALDACTONE TAKE 1 TABLET(25 MG) BY MOUTH DAILY What changed: See the new instructions.   Stiolto Respimat 2.5-2.5 MCG/ACT Aers Generic drug: Tiotropium Bromide-Olodaterol Inhale 2 puffs into the lungs daily.   Stiolto Respimat 2.5-2.5 MCG/ACT Aers Generic drug: Tiotropium Bromide-Olodaterol Inhale 2 puffs into the lungs daily.   SYRINGE-NEEDLE (DISP) 3 ML 25G X 1" 3 ML Misc Commonly known as: BD Integra Syringe USE TO INJECT B12 ONCE A MONTH   tiZANidine 4 MG capsule Commonly known as: ZANAFLEX Take 1 capsule (4 mg total) by mouth 3 (three) times daily as needed for muscle spasms.   Vitamin D3 50 MCG (2000 UT) capsule Take 1 capsule (2,000 Units total) by mouth daily.          Outstanding Labs/Studies   CBC on 1/28  Duration of Discharge Encounter   Greater than 30 minutes including physician time.  Signed, Reino Bellis, NP 10/05/2020, 9:26 AM    I have personally seen and examined this patient. I agree with the assessment and plan as outlined above.  She is doing well this am. Hgb stable. No signs of bleeding. Will start iron supplementation. Discharge today. Repeat CBC in several days.   Lauree Chandler 10/05/2020 9:26 AM

## 2020-10-05 NOTE — Plan of Care (Signed)
  Problem: Education: Goal: Knowledge of General Education information will improve Description: Including pain rating scale, medication(s)/side effects and non-pharmacologic comfort measures Outcome: Adequate for Discharge   

## 2020-10-05 NOTE — Telephone Encounter (Signed)
Left message for Christine Meyer to call regarding the rescheduling of follow up doppler studies and follow up with Dr. Conception Chancy procedure was done on Monday 10/03/20.---Dopplers have been rescheduled to Friday  10/14/20 at 10:00 am and follow up with Dr. Gwenlyn Found is 10/19/20 at 10:00 am

## 2020-10-06 ENCOUNTER — Inpatient Hospital Stay (HOSPITAL_COMMUNITY): Admission: RE | Admit: 2020-10-06 | Payer: Medicare Other | Source: Ambulatory Visit

## 2020-10-06 ENCOUNTER — Encounter (HOSPITAL_COMMUNITY): Payer: Medicare Other

## 2020-10-07 ENCOUNTER — Other Ambulatory Visit: Payer: Self-pay

## 2020-10-07 DIAGNOSIS — D649 Anemia, unspecified: Secondary | ICD-10-CM | POA: Diagnosis not present

## 2020-10-07 LAB — CBC
Hematocrit: 26.5 % — ABNORMAL LOW (ref 34.0–46.6)
Hemoglobin: 8.4 g/dL — ABNORMAL LOW (ref 11.1–15.9)
MCH: 28.6 pg (ref 26.6–33.0)
MCHC: 31.7 g/dL (ref 31.5–35.7)
MCV: 90 fL (ref 79–97)
Platelets: 220 10*3/uL (ref 150–450)
RBC: 2.94 x10E6/uL — ABNORMAL LOW (ref 3.77–5.28)
RDW: 14.5 % (ref 11.7–15.4)
WBC: 5.1 10*3/uL (ref 3.4–10.8)

## 2020-10-09 ENCOUNTER — Other Ambulatory Visit: Payer: Self-pay | Admitting: Family Medicine

## 2020-10-09 DIAGNOSIS — R630 Anorexia: Secondary | ICD-10-CM

## 2020-10-11 ENCOUNTER — Ambulatory Visit: Payer: Medicare Other | Admitting: Cardiovascular Disease

## 2020-10-14 ENCOUNTER — Ambulatory Visit (HOSPITAL_COMMUNITY)
Admission: RE | Admit: 2020-10-14 | Discharge: 2020-10-14 | Disposition: A | Discharge status: Home / Self Care | Payer: Medicare Other | Source: Ambulatory Visit | Attending: Internal Medicine | Admitting: Internal Medicine

## 2020-10-14 ENCOUNTER — Other Ambulatory Visit (HOSPITAL_COMMUNITY): Payer: Self-pay | Admitting: Cardiovascular Disease

## 2020-10-14 ENCOUNTER — Other Ambulatory Visit: Payer: Self-pay

## 2020-10-14 ENCOUNTER — Ambulatory Visit (HOSPITAL_COMMUNITY): Admit: 2020-10-14 | Payer: Medicare Other | Attending: Cardiovascular Disease | Admitting: Cardiovascular Disease

## 2020-10-14 DIAGNOSIS — Z95828 Presence of other vascular implants and grafts: Secondary | ICD-10-CM | POA: Diagnosis not present

## 2020-10-14 DIAGNOSIS — Z4889 Encounter for other specified surgical aftercare: Secondary | ICD-10-CM

## 2020-10-18 ENCOUNTER — Ambulatory Visit: Payer: Medicare Other | Admitting: Cardiovascular Disease

## 2020-10-19 ENCOUNTER — Encounter: Payer: Self-pay | Admitting: Cardiovascular Disease

## 2020-10-19 ENCOUNTER — Ambulatory Visit (INDEPENDENT_AMBULATORY_CARE_PROVIDER_SITE_OTHER): Payer: Medicare Other | Admitting: Cardiovascular Disease

## 2020-10-19 ENCOUNTER — Other Ambulatory Visit: Payer: Self-pay

## 2020-10-19 VITALS — BP 157/62 | HR 87 | Ht 62.0 in | Wt 112.6 lb

## 2020-10-19 DIAGNOSIS — I739 Peripheral vascular disease, unspecified: Secondary | ICD-10-CM

## 2020-10-19 DIAGNOSIS — K559 Vascular disorder of intestine, unspecified: Secondary | ICD-10-CM

## 2020-10-19 NOTE — Assessment & Plan Note (Signed)
History of mesenteric ischemia with abdominal pain and food avoidance status post angiography performed by myself 10/03/2020 revealing 95% celiac artery stenosis at the origin, 90% SMA stenosis at the origin which I stented.  Since that time she no longer has food avoidance or abdominal pain with eating.  She is gained 5 pounds.  Her follow-up mesenteric Doppler studies revealed normalization of her SMA velocities.  We will continue to follow her Doppler studies again in 6 months.

## 2020-10-19 NOTE — Progress Notes (Signed)
10/19/2020 Christine Meyer   Feb 12, 1951  [id number]  Primary Physician Ann Held, DO Primary Cardiologist: Lorretta Harp MD [name], [place]  HPI:  Christine Meyer is a 70 y.o.  thin appearing single African-American female mother of 4 children . She was referred by Dr. Percival Spanish , her cardiologist, for peripheral vascular evaluation. I last saw her in the office 10/03/2020. She is accompanied by her daughter Christine Meyer who is a Marine scientist for Dr. Alain Marion.She has a history of myocardial infarction back in 1999 undergoing stenting of her RCA by Dr. Melvern Banker.her chronic risk factors are notable for continued tobacco abuse, treated hypertension and hyperlipidemia. She is complained of increasing dyspnea on exertion over the last 6 months as well as right calf claudication. Dr. Percival Spanish saw her and ordered a stress test. 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 She has had right renal artery stenting in the past as well as right external iliac artery stenting as well. 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 I stented both of these areas. She did have an occluded left SFA with high-grade segmental diffuse right SFA stenosis. Her Dopplers and symptoms improved.  She had been complaining of some abdominal pain and is experiencing some weight loss. She saw Dr. Carlean Purl, her gastroenterologist who obtained an abdominal CTA and referred her to Dr. Stanford Breed, vascular surgeon for consideration of peripheral angiography and treatment of mesenteric ischemia. She had carotid Dopplers that showed moderate left ICA stenosis and lower extremity Dopplers performed today that suggested in-stent restenosis within her right iliac stent.  I performed angiography on her 10/03/2020 revealing patent  right iliac stent, total SFAs bilaterally, 90% calcified proximal left common iliac artery stenosis, subtotally occluded celiac axis at the origin and 90% SMA which I stented successfully.  She was discharged home the following day.  She did have a slight drop in her hemoglobin but no evidence of hematoma or retroperitoneal bleed.  Her follow-up Dopplers performed a week later showed a widely patent SMA.  Her abdominal pain resolved after the intervention and she had dinner in the hospital that evening without discomfort.  She states since gained 5 pounds.  She does complain of left lower extremity claudication however wishes to have this intervened on in the near future.  Current Meds  Medication Sig  . ACCU-CHEK SOFTCLIX LANCETS lancets Check blood sugar once daily. Dx:E11.9  . aspirin EC 81 MG tablet Take 1 tablet (81 mg total) by mouth daily.  Marland Kitchen atorvastatin (LIPITOR) 40 MG tablet TAKE 1 TABLET BY MOUTH  DAILY (Patient taking differently: Take 40 mg by mouth daily.)  . Blood Glucose Monitoring Suppl (ONETOUCH VERIO) w/Device KIT Use daily to check blood sugar.   DX E11.9  . Calcium 500-100 MG-UNIT CHEW Chew 1 tablet by mouth daily before breakfast.  . Cholecalciferol (VITAMIN D3) 50 MCG (2000 UT) capsule Take 1 capsule (2,000 Units total) by mouth daily.  . clopidogrel (PLAVIX) 75 MG tablet TAKE 1 TABLET(75 MG) BY MOUTH DAILY (Patient taking differently: Take 75 mg by mouth daily.)  . cyanocobalamin (,VITAMIN B-12,) 1000 MCG/ML injection Inject 1 mL (1,000 mcg total) into the muscle every 30 (thirty) days.  . ESBRIET 801 MG TABS TAKE 1 TABLET (801MG) BY  MOUTH THREE TIMES DAILY  WITH FOOD (Patient taking differently:  Take 801 mg by mouth 2 (two) times daily.)  . glucose blood (ONETOUCH VERIO) test strip USE AS DIRECTED DAILY  . HYDROcodone-acetaminophen (NORCO/VICODIN) 5-325 MG tablet Take 1 tablet by mouth every 6 (six) hours as needed for severe pain.  . Iron, Ferrous Sulfate, 325 (65 Fe) MG  TABS Take 325 mg by mouth daily.  . meloxicam (MOBIC) 7.5 MG tablet Take 1 tablet (7.5 mg total) by mouth daily as needed for pain.  . metoprolol succinate (TOPROL-XL) 25 MG 24 hr tablet Take 1 tablet (25 mg total) by mouth daily.  . mirtazapine (REMERON) 15 MG tablet Take 1 tablet (15 mg total) by mouth at bedtime.  . Multiple Vitamins-Minerals (A THRU Z SELECT 50+ ADVANCED) TABS TAKE 1 TABLET BY MOUTH DAILY (Patient taking differently: Take 1 tablet by mouth daily.)  . olmesartan (BENICAR) 20 MG tablet TAKE 1 TABLET(20 MG) BY MOUTH DAILY (Patient taking differently: Take 20 mg by mouth daily.)  . pantoprazole (PROTONIX) 40 MG tablet Take 1 tablet (40 mg total) by mouth daily before breakfast.  . spironolactone (ALDACTONE) 25 MG tablet TAKE 1 TABLET(25 MG) BY MOUTH DAILY (Patient taking differently: Take 25 mg by mouth daily.)  . SYRINGE-NEEDLE, DISP, 3 ML (BD INTEGRA SYRINGE) 25G X 1" 3 ML MISC USE TO INJECT B12 ONCE A MONTH  . Tiotropium Bromide-Olodaterol (STIOLTO RESPIMAT) 2.5-2.5 MCG/ACT AERS Inhale 2 puffs into the lungs daily.  Marland Kitchen tiZANidine (ZANAFLEX) 4 MG capsule Take 1 capsule (4 mg total) by mouth 3 (three) times daily as needed for muscle spasms.     No Known Allergies  Social History   Socioeconomic History  . Marital status: Single    Spouse name: Not on file  . Number of children: 4  . Years of education: Not on file  . Highest education level: Not on file  Occupational History  . 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    Employer: [employer]  Tobacco Use  . Smoking status: Current Every Day Smoker    Packs/day: 0.25    Years: 51.00    Pack years: 12.75    Types: Cigarettes  . Smokeless tobacco: Never Used  . Tobacco comment: currently smoking 2-3 cigs per day  Vaping Use  . Vaping Use: Never used  Substance and Sexual Activity  . Alcohol use: No    Alcohol/week: 0.0 standard drinks  . Drug use: No  . Sexual  activity: Not Currently  Other Topics Concern  . Not on file  Social History Narrative      Daughter Christine Meyer is CMA Financial controller primary care [employer]   Retired Risk analyst   Smoker, no EtOH/drugs   Social Determinants of Radio broadcast assistant Strain: Not on [employer] Insecurity: Not on file  Transportation Needs: Not on file  Physical Activity: Not on file  Stress: Not on file  Social Connections: Not on file  Intimate Partner Violence: Not on file     Review of Systems: General: negative for chills, fever, night sweats or weight changes.  Cardiovascular: negative for chest pain, dyspnea on exertion, edema, orthopnea, palpitations, paroxysmal nocturnal dyspnea or shortness of breath Dermatological: negative for rash Respiratory: negative for cough or wheezing Urologic: negative for hematuria Abdominal: negative for nausea, vomiting, diarrhea, bright red blood per rectum, melena, or hematemesis Neurologic: negative for visual changes, syncope, or dizziness All other systems reviewed and are otherwise negative except as noted above.    Blood pressure Marland Kitchen)  157/62, pulse 87, height 5' 2" (1.575 m), weight 112 lb 9.6 oz (51.1 kg), SpO2 100 %.  General appearance: alert and no distress Neck: no adenopathy, no JVD, supple, symmetrical, trachea midline, thyroid not enlarged, symmetric, no tenderness/mass/nodules and Bilateral carotid bruits Lungs: clear to auscultation bilaterally Heart: regular rate and rhythm, S1, S2 normal, no murmur, click, rub or gallop Extremities: Absent pedal pulses Pulses: Absent pedal pulses Skin: Skin color, texture, turgor normal. No rashes or lesions Neurologic: Alert and oriented X 3, normal strength and tone. Normal symmetric reflexes. 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  Since that time she no longer has food avoidance or abdominal pain with eating.  She is gained 5 pounds.  Her follow-up mesenteric Doppler studies revealed normalization of her SMA velocities.  We will continue to follow her Doppler studies again in 6 months.  Claudication (HCC) History of peripheral arterial disease status post right renal artery stenting as well as right external artery stenting in the past.  Angiography performed 11/22/2014 revealed occluded right renal artery stent and high-grade ostial right common iliac artery stenosis as well as right external iliac artery in-stent restenosis both of which are intervened on.  She had an occluded left SFA with high-grade segmental diffuse right SFA stenosis.  Recent angiography performed during her mesenteric angiogram revealed occluded SFAs bilaterally with patent right iliac stent.  She did have 90% calcified proximal left common iliac artery stenosis and complains of left lower extremity claudication.  She wishes to have this intervened on.  We will wait for 3 months read readdress at that time.      Jonathan J. Berry MD FACP,FACC,FAHA, FSCAI 10/19/2020 10:53 AM 

## 2020-10-19 NOTE — Assessment & Plan Note (Signed)
History of peripheral arterial disease status post right renal artery stenting as well as right external artery stenting in the past.  Angiography performed 11/22/2014 revealed occluded right renal artery stent and high-grade ostial right common iliac artery stenosis as well as right external iliac artery in-stent restenosis both of which are intervened on.  She had an occluded left SFA with high-grade segmental diffuse right SFA stenosis.  Recent angiography performed during her mesenteric angiogram revealed occluded SFAs bilaterally with patent right iliac stent.  She did have 90% calcified proximal left common iliac artery stenosis and complains of left lower extremity claudication.  She wishes to have this intervened on.  We will wait for 3 months read readdress at that time.

## 2020-10-19 NOTE — Patient Instructions (Addendum)
Medication Instructions:  Your physician recommends that you continue on your current medications as directed. Please refer to the Current Medication list given to you today.  *If you need a refill on your cardiac medications before your next appointment, please call your pharmacy*   Testing/Procedures: Your physician has requested that you have an mesenteric duplex. During this test, an ultrasound is used to evaluate the aorta. Allow 30 minutes for this exam. Do not eat after midnight the day before and avoid carbonated beverages. This procedure is done at [facility]. 2nd Floor. To be done in Aug. 2022.   Follow-Up: At [facility], you and your health needs are our priority.  As part of our continuing mission to provide you with exceptional heart care, we have created designated Provider Care Teams.  These Care Teams include your primary Cardiologist (physician) and Advanced Practice Providers (APPs -  Physician Assistants and Nurse Practitioners) who all work together to provide you with the care you need, when you need it.  We recommend signing up for the patient portal called "MyChart".  Sign up information is provided on this After Visit Summary.  MyChart is used to connect with patients for Virtual Visits (Telemedicine).  Patients are able to view lab/test results, encounter notes, upcoming appointments, etc.  Non-urgent messages can be sent to your provider as well.   To learn more about what you can do with MyChart, go to [email or web address].    Your next appointment:   3 month(s)  The format for your next appointment:   In Person  Provider:   Quay Burow, MD

## 2020-10-20 ENCOUNTER — Other Ambulatory Visit: Payer: Self-pay | Admitting: Family Medicine

## 2020-10-20 ENCOUNTER — Other Ambulatory Visit: Payer: Self-pay | Admitting: Cardiovascular Disease

## 2020-10-20 DIAGNOSIS — I10 Essential (primary) hypertension: Secondary | ICD-10-CM

## 2020-11-22 NOTE — Telephone Encounter (Signed)
Spoke with pt on the phone. Christine Meyer explains to me that her left leg specifically is the issue right now. The leg and foot have pain, tingling and numbness. Pt notices this mostly at night and says that last night she slept in her chair with her legs on a foot stool and this seemed to help a bit. Pt states that she has tried massaging the leg to help with circulation. Appointment made for pt 12/02/20 at 9:30am to see Dr. Gwenlyn Found and discuss next options. Pt verbalizes understanding.

## 2020-11-22 NOTE — Telephone Encounter (Signed)
Patient returning call left by Abner Greenspan

## 2020-11-22 NOTE — Telephone Encounter (Signed)
Spoke with pt who is complaining of worsening pain and numbness in her left foot.  Denies current LEE. Pt reports pain is worse at night and she is having difficulty sleeping.  Pt reports she slept on couch last night with foot and leg on her foot stool which did offer some relief.  She is taking medication as prescribed.  Pt does not think she can wait until May to be seen by Dr Gwenlyn Found if pain continues at this level.  Will forward information to Dr Gwenlyn Found and his RN for review and further recommendation.  Pt verbalizes understanding and agrees with current plan.

## 2020-12-02 ENCOUNTER — Other Ambulatory Visit: Payer: Self-pay

## 2020-12-02 ENCOUNTER — Ambulatory Visit (INDEPENDENT_AMBULATORY_CARE_PROVIDER_SITE_OTHER): Payer: Medicare Other | Admitting: Cardiovascular Disease

## 2020-12-02 ENCOUNTER — Encounter: Payer: Self-pay | Admitting: Cardiovascular Disease

## 2020-12-02 DIAGNOSIS — I739 Peripheral vascular disease, unspecified: Secondary | ICD-10-CM | POA: Diagnosis not present

## 2020-12-02 LAB — CBC
Hematocrit: 25.8 % — ABNORMAL LOW (ref 34.0–46.6)
Hemoglobin: 8.1 g/dL — ABNORMAL LOW (ref 11.1–15.9)
MCH: 28.6 pg (ref 26.6–33.0)
MCHC: 31.4 g/dL — ABNORMAL LOW (ref 31.5–35.7)
MCV: 91 fL (ref 79–97)
Platelets: 116 10*3/uL — ABNORMAL LOW (ref 150–450)
RBC: 2.83 x10E6/uL — ABNORMAL LOW (ref 3.77–5.28)
RDW: 13.2 % (ref 11.7–15.4)
WBC: 2.9 10*3/uL — ABNORMAL LOW (ref 3.4–10.8)

## 2020-12-02 LAB — BASIC METABOLIC PANEL
BUN/Creatinine Ratio: 13 (ref 12–28)
BUN: 16 mg/dL (ref 8–27)
CO2: 17 mmol/L — ABNORMAL LOW (ref 20–29)
Calcium: 9.8 mg/dL (ref 8.7–10.3)
Chloride: 100 mmol/L (ref 96–106)
Creatinine, Ser: 1.24 mg/dL — ABNORMAL HIGH (ref 0.57–1.00)
Glucose: 66 mg/dL (ref 65–99)
Potassium: 4.4 mmol/L (ref 3.5–5.2)
Sodium: 141 mmol/L (ref 134–144)
eGFR: 47 mL/min/{1.73_m2} — ABNORMAL LOW (ref >59)

## 2020-12-02 LAB — SPECIMEN STATUS REPORT

## 2020-12-02 MED ORDER — SODIUM CHLORIDE 0.9% FLUSH: 3.0000 mL | Freq: Two times a day (BID) | INTRAVENOUS | Status: DC

## 2020-12-02 NOTE — Progress Notes (Signed)
12/02/2020 Christine Meyer   08/11/1951  [id number]  Primary Physician Ann Held, DO Primary Cardiologist: Lorretta Harp MD Christine Meyer, [place]  HPI:  Christine Meyer is a 70 y.o.  thin appearing single African-American female mother of 4 children . She was referred by Dr. Percival Spanish , her cardiologist, for peripheral vascular evaluation. I last saw her in the office 10/19/2020. She has a history of myocardial infarction back in 1999 undergoing stenting of her RCA by Dr. Melvern Banker.her chronic risk factors are notable for continued tobacco abuse, treated hypertension and hyperlipidemia. She is complained of increasing dyspnea on exertion over the last 6 months as well as right calf claudication. Dr. Percival Spanish saw her and ordered a stress test. 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 She has had right renal artery stenting in the past as well as right external iliac artery stenting as well. 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 I stented both of these areas. She did have an occluded left SFA with high-grade segmental diffuse right SFA stenosis. Her Dopplers and symptoms improved.  She had been complaining of some abdominal pain and is experiencing some weight loss. She saw Dr. Carlean Purl, her gastroenterologist who obtained an abdominal CTA and referred her to Dr. Stanford Breed, vascular surgeon for consideration of peripheral angiography and treatment of mesenteric ischemia. She had carotid Dopplers that showed moderate left ICA stenosis and lower extremity Dopplers performed today that suggested in-stent restenosis within her right iliac stent.  I performed angiography on her 10/03/2020 revealing patent right iliac stent, total SFAs bilaterally, 90% calcified proximal left  common iliac artery stenosis, subtotally occluded celiac axis at the origin and 90% SMA which I stented successfully.  She was discharged home the following day.  She did have a slight drop in her hemoglobin but no evidence of hematoma or retroperitoneal bleed.  Her follow-up Dopplers performed a week later showed a widely patent SMA.  Her abdominal pain resolved after the intervention and she had dinner in the hospital that evening without discomfort.  She states since gained 5 pounds.  She does complain of left lower extremity claudication however wishes to have this intervened on in the near future.  Since I saw her 6 weeks ago she is back today to discuss her left lower extremity.  She complains of pain and numbness in her left leg and foot with left lower extremity greater than right lower extremity claudication and wishes to proceed with angiography and percutaneous intervention.   Current Meds  Medication Sig  . ACCU-CHEK SOFTCLIX LANCETS lancets Check blood sugar once daily. Dx:E11.9  . aspirin EC 81 MG tablet Take 1 tablet (81 mg total) by mouth daily.  Marland Kitchen atorvastatin (LIPITOR) 40 MG tablet TAKE 1 TABLET BY MOUTH  DAILY (Patient taking differently: Take 40 mg by mouth daily.)  . Blood Glucose Monitoring Suppl (ONETOUCH VERIO) w/Device KIT Use daily to check blood sugar.   DX E11.9  . Calcium 500-100 MG-UNIT CHEW Chew 1 tablet by mouth daily before breakfast.  . Cholecalciferol (VITAMIN D3) 50 MCG (2000 UT) capsule Take 1 capsule (2,000 Units total) by mouth daily.  . clopidogrel (PLAVIX) 75 MG tablet TAKE 1 TABLET(75 MG) BY MOUTH DAILY  . cyanocobalamin (,VITAMIN B-12,) 1000 MCG/ML injection Inject 1 mL (1,000 mcg total)  into the muscle every 30 (thirty) days.  . ESBRIET 801 MG TABS TAKE 1 TABLET (801MG) BY  MOUTH THREE TIMES DAILY  WITH FOOD (Patient taking differently: Take 801 mg by mouth 2 (two) times daily.)  . glucose blood (ONETOUCH VERIO) test strip USE AS DIRECTED DAILY  .  HYDROcodone-acetaminophen (NORCO/VICODIN) 5-325 MG tablet Take 1 tablet by mouth every 6 (six) hours as needed for severe pain.  . Iron, Ferrous Sulfate, 325 (65 Fe) MG TABS Take 325 mg by mouth daily.  . meloxicam (MOBIC) 7.5 MG tablet Take 1 tablet (7.5 mg total) by mouth daily as needed for pain.  . metoprolol succinate (TOPROL-XL) 25 MG 24 hr tablet Take 1 tablet (25 mg total) by mouth daily.  . mirtazapine (REMERON) 15 MG tablet Take 1 tablet (15 mg total) by mouth at bedtime.  . Multiple Vitamins-Minerals (A THRU Z SELECT 50+ ADVANCED) TABS TAKE 1 TABLET BY MOUTH DAILY (Patient taking differently: Take 1 tablet by mouth daily.)  . olmesartan (BENICAR) 20 MG tablet Take 1 tablet (20 mg total) by mouth daily.  . pantoprazole (PROTONIX) 40 MG tablet Take 1 tablet (40 mg total) by mouth daily before breakfast.  . spironolactone (ALDACTONE) 25 MG tablet Take 1 tablet (25 mg total) by mouth daily.  . SYRINGE-NEEDLE, DISP, 3 ML (BD INTEGRA SYRINGE) 25G X 1" 3 ML MISC USE TO INJECT B12 ONCE A MONTH  . Tiotropium Bromide-Olodaterol (STIOLTO RESPIMAT) 2.5-2.5 MCG/ACT AERS Inhale 2 puffs into the lungs daily.  . [DISCONTINUED] tiZANidine (ZANAFLEX) 4 MG capsule Take 1 capsule (4 mg total) by mouth 3 (three) times daily as needed for muscle spasms.     No Known Allergies  Social History   Socioeconomic History  . Marital status: Single    Spouse name: Not on file  . Number of children: 4  . Years of education: Not on file  . Highest education level: Not on file  Occupational History  . 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    Employer: [employer]  Tobacco Use  . Smoking status: Current Every Day Smoker    Packs/day: 0.25    Years: 51.00    Pack years: 12.75    Types: Cigarettes  . Smokeless tobacco: Never Used  . Tobacco comment: currently smoking 2-3 cigs per day  Vaping Use  . Vaping Use: Never used  Substance and Sexual Activity  .  Alcohol use: No    Alcohol/week: 0.0 standard drinks  . Drug use: No  . Sexual activity: Not Currently  Other Topics Concern  . Not on file  Social History Narrative      Daughter [name] is CMA Financial controller primary care [employer]   Retired Risk analyst   Smoker, no EtOH/drugs   Social Determinants of Radio broadcast assistant Strain: Not on [employer] Insecurity: Not on file  Transportation Needs: Not on file  Physical Activity: Not on file  Stress: Not on file  Social Connections: Not on file  Intimate Partner Violence: Not on file     Review of Systems: General: negative for chills, fever, night sweats or weight changes.  Cardiovascular: negative for chest pain, dyspnea on exertion, edema, orthopnea, palpitations, paroxysmal nocturnal dyspnea or shortness of breath Dermatological: negative for rash Respiratory: negative for cough or wheezing Urologic: negative for hematuria Abdominal: negative for nausea, vomiting, diarrhea, bright red blood per rectum, melena, or hematemesis Neurologic: negative for visual changes, syncope, or dizziness All  other systems reviewed and are otherwise negative except as noted above.    Blood pressure (!) 144/52, pulse 78, height 5' 2"  (1.575 m), weight 115 lb 9.6 oz (52.4 kg), SpO2 98 %.  General appearance: alert and no distress Neck: no adenopathy, no JVD, supple, symmetrical, trachea midline, thyroid not enlarged, symmetric, no tenderness/mass/nodules and Right carotid Berry Lungs: clear to auscultation bilaterally Heart: regular rate and rhythm, S1, S2 normal, no murmur, click, rub or gallop Extremities: extremities normal, atraumatic, no cyanosis or edema Pulses: Absent pedal pulses Skin: Skin color, texture, turgor normal. No rashes or lesions Neurologic: Alert and oriented X 3, normal strength and tone. Normal symmetric reflexes. Normal coordination and gait  EKG not performed today  ASSESSMENT AND PLAN:   Peripheral arterial  disease Ms. Bortner returns today for follow-up of her PAD.  She has had stenting of her right common and external iliac artery with restenting 11/22/2014.  At that time she had a known occluded left SFA with two-vessel runoff with moderately severe segmental disease in the right SFA.  At the time of recent angiography 10/03/2020 when I stented her SMA I did demonstrate a patent right external iliac stent with 90% calcified left common iliac artery stenosis.  She complains of pain and numbness in her left leg and foot when she lies down at night as well as claudication left greater than right.  She would be a candidate for orbital atherectomy and covered stenting of her left common iliac artery.      Lorretta Harp MD FACP,FACC,FAHA, [facility] 12/02/2020 9:33 AM

## 2020-12-02 NOTE — H&P (View-Only) (Signed)
12/02/2020 Christine Meyer   01/23/1951  [id number]  Primary Physician Ann Held, DO Primary Cardiologist: Christine Harp MD [name], [place]  HPI:  Christine Meyer is a 70 y.o.  thin appearing single African-American female mother of 4 children . She was referred by Dr. Percival Spanish , her cardiologist, for peripheral vascular evaluation. I last saw her in the office 10/19/2020. She has a history of myocardial infarction back in 1999 undergoing stenting of her RCA by Dr. Melvern Banker.her chronic risk factors are notable for continued tobacco abuse, treated hypertension and hyperlipidemia. She is complained of increasing dyspnea on exertion over the last 6 months as well as right calf claudication. Dr. Percival Spanish saw her and ordered a stress test. 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 She has had right renal artery stenting in the past as well as right external iliac artery stenting as well. 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 I stented both of these areas. She did have an occluded left SFA with high-grade segmental diffuse right SFA stenosis. Her Dopplers and symptoms improved.  She had been complaining of some abdominal pain and is experiencing some weight loss. She saw Dr. Carlean Purl, her gastroenterologist who obtained an abdominal CTA and referred her to Dr. Stanford Breed, vascular surgeon for consideration of peripheral angiography and treatment of mesenteric ischemia. She had carotid Dopplers that showed moderate left ICA stenosis and lower extremity Dopplers performed today that suggested in-stent restenosis within her right iliac stent.  I performed angiography on her 10/03/2020 revealing patent right iliac stent, total SFAs bilaterally, 90% calcified proximal left  common iliac artery stenosis, subtotally occluded celiac axis at the origin and 90% SMA which I stented successfully.  She was discharged home the following day.  She did have a slight drop in her hemoglobin but no evidence of hematoma or retroperitoneal bleed.  Her follow-up Dopplers performed a week later showed a widely patent SMA.  Her abdominal pain resolved after the intervention and she had dinner in the hospital that evening without discomfort.  She states since gained 5 pounds.  She does complain of left lower extremity claudication however wishes to have this intervened on in the near future.  Since I saw her 6 weeks ago she is back today to discuss her left lower extremity.  She complains of pain and numbness in her left leg and foot with left lower extremity greater than right lower extremity claudication and wishes to proceed with angiography and percutaneous intervention.   Current Meds  Medication Sig  . ACCU-CHEK SOFTCLIX LANCETS lancets Check blood sugar once daily. Dx:E11.9  . aspirin EC 81 MG tablet Take 1 tablet (81 mg total) by mouth daily.  Marland Kitchen atorvastatin (LIPITOR) 40 MG tablet TAKE 1 TABLET BY MOUTH  DAILY (Patient taking differently: Take 40 mg by mouth daily.)  . Blood Glucose Monitoring Suppl (ONETOUCH VERIO) w/Device KIT Use daily to check blood sugar.   DX E11.9  . Calcium 500-100 MG-UNIT CHEW Chew 1 tablet by mouth daily before breakfast.  . Cholecalciferol (VITAMIN D3) 50 MCG (2000 UT) capsule Take 1 capsule (2,000 Units total) by mouth daily.  . clopidogrel (PLAVIX) 75 MG tablet TAKE 1 TABLET(75 MG) BY MOUTH DAILY  . cyanocobalamin (,VITAMIN B-12,) 1000 MCG/ML injection Inject 1 mL (1,000 mcg total)  into the muscle every 30 (thirty) days.  . ESBRIET 801 MG TABS TAKE 1 TABLET (801MG) BY  MOUTH THREE TIMES DAILY  WITH FOOD (Patient taking differently: Take 801 mg by mouth 2 (two) times daily.)  . glucose blood (ONETOUCH VERIO) test strip USE AS DIRECTED DAILY  .  HYDROcodone-acetaminophen (NORCO/VICODIN) 5-325 MG tablet Take 1 tablet by mouth every 6 (six) hours as needed for severe pain.  . Iron, Ferrous Sulfate, 325 (65 Fe) MG TABS Take 325 mg by mouth daily.  . meloxicam (MOBIC) 7.5 MG tablet Take 1 tablet (7.5 mg total) by mouth daily as needed for pain.  . metoprolol succinate (TOPROL-XL) 25 MG 24 hr tablet Take 1 tablet (25 mg total) by mouth daily.  . mirtazapine (REMERON) 15 MG tablet Take 1 tablet (15 mg total) by mouth at bedtime.  . Multiple Vitamins-Minerals (A THRU Z SELECT 50+ ADVANCED) TABS TAKE 1 TABLET BY MOUTH DAILY (Patient taking differently: Take 1 tablet by mouth daily.)  . olmesartan (BENICAR) 20 MG tablet Take 1 tablet (20 mg total) by mouth daily.  . pantoprazole (PROTONIX) 40 MG tablet Take 1 tablet (40 mg total) by mouth daily before breakfast.  . spironolactone (ALDACTONE) 25 MG tablet Take 1 tablet (25 mg total) by mouth daily.  . SYRINGE-NEEDLE, DISP, 3 ML (BD INTEGRA SYRINGE) 25G X 1" 3 ML MISC USE TO INJECT B12 ONCE A MONTH  . Tiotropium Bromide-Olodaterol (STIOLTO RESPIMAT) 2.5-2.5 MCG/ACT AERS Inhale 2 puffs into the lungs daily.  . [DISCONTINUED] tiZANidine (ZANAFLEX) 4 MG capsule Take 1 capsule (4 mg total) by mouth 3 (three) times daily as needed for muscle spasms.     No Known Allergies  Social History   Socioeconomic History  . Marital status: Single    Spouse name: Not on file  . Number of children: 4  . Years of education: Not on file  . Highest education level: Not on file  Occupational History  . 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    Employer: [employer]  Tobacco Use  . Smoking status: Current Every Day Smoker    Packs/day: 0.25    Years: 51.00    Pack years: 12.75    Types: Cigarettes  . Smokeless tobacco: Never Used  . Tobacco comment: currently smoking 2-3 cigs per day  Vaping Use  . Vaping Use: Never used  Substance and Sexual Activity  .  Alcohol use: No    Alcohol/week: 0.0 standard drinks  . Drug use: No  . Sexual activity: Not Currently  Other Topics Concern  . Not on file  Social History Narrative      Daughter [name] is CMA Financial controller primary care [place]   Retired Risk analyst   Smoker, no EtOH/drugs   Social Determinants of Radio broadcast assistant Strain: Not on [employer] Insecurity: Not on file  Transportation Needs: Not on file  Physical Activity: Not on file  Stress: Not on file  Social Connections: Not on file  Intimate Partner Violence: Not on file     Review of Systems: General: negative for chills, fever, night sweats or weight changes.  Cardiovascular: negative for chest pain, dyspnea on exertion, edema, orthopnea, palpitations, paroxysmal nocturnal dyspnea or shortness of breath Dermatological: negative for rash Respiratory: negative for cough or wheezing Urologic: negative for hematuria Abdominal: negative for nausea, vomiting, diarrhea, bright red blood per rectum, melena, or hematemesis Neurologic: negative for visual changes, syncope, or dizziness All  other systems reviewed and are otherwise negative except as noted above.    Blood pressure (!) 144/52, pulse 78, height 5' 2"  (1.575 m), weight 115 lb 9.6 oz (52.4 kg), SpO2 98 %.  General appearance: alert and no distress Neck: no adenopathy, no JVD, supple, symmetrical, trachea midline, thyroid not enlarged, symmetric, no tenderness/mass/nodules and Right carotid Christine Meyer Lungs: clear to auscultation bilaterally Heart: regular rate and rhythm, S1, S2 normal, no murmur, click, rub or gallop Extremities: extremities normal, atraumatic, no cyanosis or edema Pulses: Absent pedal pulses Skin: Skin color, texture, turgor normal. No rashes or lesions Neurologic: Alert and oriented X 3, normal strength and tone. Normal symmetric reflexes. Normal coordination and gait  EKG not performed today  ASSESSMENT AND PLAN:   Peripheral arterial  disease Ms. Jaffe returns today for follow-up of her PAD.  She has had stenting of her right common and external iliac artery with restenting 11/22/2014.  At that time she had a known occluded left SFA with two-vessel runoff with moderately severe segmental disease in the right SFA.  At the time of recent angiography 10/03/2020 when I stented her SMA I did demonstrate a patent right external iliac stent with 90% calcified left common iliac artery stenosis.  She complains of pain and numbness in her left leg and foot when she lies down at night as well as claudication left greater than right.  She would be a candidate for orbital atherectomy and covered stenting of her left common iliac artery.      Christine Harp MD FACP,FACC,FAHA, [facility] 12/02/2020 9:33 AM

## 2020-12-02 NOTE — Patient Instructions (Signed)
    [name] [address] Dept: [phone] Loc: [phone]  Christine Meyer  12/02/2020  You are scheduled for a Peripheral Angiogram on Thursday, March 31 with Dr. Quay Burow.  1. Please arrive at the [facility] (Main Entrance A) at [facility]: [address] [place], [place] [address] at 7:30 AM (This time is two hours before your procedure to ensure your preparation). Free valet parking service is available.   Special note: Every effort is made to have your procedure done on time. Please understand that emergencies sometimes delay scheduled procedures.  2. Diet: Do not eat solid foods after midnight.  The patient may have clear liquids until 5am upon the day of the procedure.  3. Labs: You will need to have blood drawn today.  4. Medication instructions in preparation for your procedure:    On the morning of your procedure, take your Aspirin and Plavix and any morning medicines NOT listed above.  You may use sips of water.  5. Plan for one night stay--bring personal belongings. 6. Bring a current list of your medications and current insurance cards. 7. You MUST have a responsible person to drive you home. 8. Someone MUST be with you the first 24 hours after you arrive home or your discharge will be delayed. 9. Please wear clothes that are easy to get on and off and wear slip-on shoes.  Thank you for allowing [place] to care for you!   -- [facility]  You will need a COVID-19  test prior to your procedure. You are scheduled for Tuesday 3/29 at 9:20 AM. This is a Drive Up Visit at [address] [place], [place] [address]. Someone will direct you to the appropriate testing line. Stay in your car and someone will be with you shortly.  You will need to have a Aorta/IVC/Iliac ultrasound 1 week after your procedure.  You  will need a 2 week follow up appointment with Dr. Gwenlyn Found after your procedure.

## 2020-12-02 NOTE — Assessment & Plan Note (Signed)
Christine Meyer returns today for follow-up of her PAD.  She has had stenting of her right common and external iliac artery with restenting 11/22/2014.  At that time she had a known occluded left SFA with two-vessel runoff with moderately severe segmental disease in the right SFA.  At the time of recent angiography 10/03/2020 when I stented her SMA I did demonstrate a patent right external iliac stent with 90% calcified left common iliac artery stenosis.  She complains of pain and numbness in her left leg and foot when she lies down at night as well as claudication left greater than right.  She would be a candidate for orbital atherectomy and covered stenting of her left common iliac artery.

## 2020-12-03 LAB — HM HEPATITIS C SCREENING LAB: HM Hepatitis Screen: NEGATIVE

## 2020-12-04 ENCOUNTER — Other Ambulatory Visit: Payer: Self-pay | Admitting: Family Medicine

## 2020-12-04 DIAGNOSIS — D509 Iron deficiency anemia, unspecified: Secondary | ICD-10-CM

## 2020-12-06 ENCOUNTER — Other Ambulatory Visit (HOSPITAL_COMMUNITY)
Admission: RE | Admit: 2020-12-06 | Discharge: 2020-12-06 | Disposition: A | Discharge status: Home / Self Care | Payer: Medicare Other | Source: Ambulatory Visit | Attending: Cardiovascular Disease | Admitting: Cardiovascular Disease

## 2020-12-06 ENCOUNTER — Other Ambulatory Visit: Payer: Self-pay

## 2020-12-06 ENCOUNTER — Encounter: Payer: Self-pay | Admitting: Family Medicine

## 2020-12-06 DIAGNOSIS — I10 Essential (primary) hypertension: Secondary | ICD-10-CM

## 2020-12-06 DIAGNOSIS — Z01812 Encounter for preprocedural laboratory examination: Secondary | ICD-10-CM | POA: Insufficient documentation

## 2020-12-06 DIAGNOSIS — Z20822 Contact with and (suspected) exposure to covid-19: Secondary | ICD-10-CM | POA: Diagnosis not present

## 2020-12-06 LAB — SARS CORONAVIRUS 2 (TAT 6-24 HRS): SARS Coronavirus 2: NEGATIVE

## 2020-12-06 MED ORDER — IRON (FERROUS SULFATE) 325 (65 FE) MG PO TABS: 325.0000 mg | ORAL_TABLET | Freq: Every day | ORAL | 2 refills | Status: DC

## 2020-12-06 MED ORDER — METOPROLOL SUCCINATE ER 25 MG PO TB24: 25.0000 mg | ORAL_TABLET | Freq: Every day | ORAL | 1 refills | Status: DC

## 2020-12-07 ENCOUNTER — Telehealth: Payer: Self-pay | Admitting: *Deleted

## 2020-12-07 NOTE — Telephone Encounter (Signed)
Pt contacted pre-abdominal aortogram  scheduled at [facility] for: Thursday December 08, 2020 9:30  AM Verified arrival time and place: [facility]) at: 7:30 AM   No solid food after midnight prior to cath, clear liquids until 5 AM day of procedure.  Hold: Spironolactone -day before and day of procedure-GFR 47 Olmesartan-day before and day of procedure -GFR 79 Mobic-pt reports she is not currently taking  Except hold medications AM meds can be  taken pre-cath with sips of water including: ASA 81 mg Plavix 75 mg   Confirmed patient has responsible adult to drive home post procedure and be with patient first 24 hours after arriving home: yes  You are allowed ONE visitor in the waiting room during the time you are at the hospital for your procedure. Both you and your visitor must wear a mask once you enter the hospital.   Reviewed procedure/mask/visitor instructions with patient.

## 2020-12-08 ENCOUNTER — Ambulatory Visit (HOSPITAL_COMMUNITY)
Admission: RE | Admit: 2020-12-08 | Discharge: 2020-12-09 | Disposition: A | Discharge status: Home / Self Care | Payer: Medicare Other | Attending: Cardiovascular Disease | Admitting: Cardiovascular Disease

## 2020-12-08 ENCOUNTER — Other Ambulatory Visit: Payer: Self-pay

## 2020-12-08 ENCOUNTER — Encounter (HOSPITAL_COMMUNITY): Payer: Self-pay | Admitting: Cardiovascular Disease

## 2020-12-08 ENCOUNTER — Encounter (HOSPITAL_COMMUNITY)
Admission: RE | Disposition: A | Discharge status: Home / Self Care | Payer: Self-pay | Source: Home / Self Care | Attending: Cardiovascular Disease

## 2020-12-08 DIAGNOSIS — I70212 Atherosclerosis of native arteries of extremities with intermittent claudication, left leg: Secondary | ICD-10-CM | POA: Diagnosis not present

## 2020-12-08 DIAGNOSIS — I10 Essential (primary) hypertension: Secondary | ICD-10-CM | POA: Insufficient documentation

## 2020-12-08 DIAGNOSIS — Z7902 Long term (current) use of antithrombotics/antiplatelets: Secondary | ICD-10-CM | POA: Diagnosis not present

## 2020-12-08 DIAGNOSIS — Z95828 Presence of other vascular implants and grafts: Secondary | ICD-10-CM | POA: Diagnosis not present

## 2020-12-08 DIAGNOSIS — E785 Hyperlipidemia, unspecified: Secondary | ICD-10-CM | POA: Diagnosis not present

## 2020-12-08 DIAGNOSIS — Z79899 Other long term (current) drug therapy: Secondary | ICD-10-CM | POA: Insufficient documentation

## 2020-12-08 DIAGNOSIS — I252 Old myocardial infarction: Secondary | ICD-10-CM | POA: Insufficient documentation

## 2020-12-08 DIAGNOSIS — I739 Peripheral vascular disease, unspecified: Secondary | ICD-10-CM | POA: Diagnosis present

## 2020-12-08 DIAGNOSIS — Z7982 Long term (current) use of aspirin: Secondary | ICD-10-CM | POA: Insufficient documentation

## 2020-12-08 DIAGNOSIS — F17210 Nicotine dependence, cigarettes, uncomplicated: Secondary | ICD-10-CM | POA: Diagnosis not present

## 2020-12-08 DIAGNOSIS — I70213 Atherosclerosis of native arteries of extremities with intermittent claudication, bilateral legs: Secondary | ICD-10-CM | POA: Insufficient documentation

## 2020-12-08 HISTORY — PX: PERIPHERAL VASCULAR INTERVENTION: CATH118257

## 2020-12-08 HISTORY — PX: PERIPHERAL VASCULAR ATHERECTOMY: CATH118256

## 2020-12-08 HISTORY — PX: LOWER EXTREMITY ANGIOGRAPHY: CATH118251

## 2020-12-08 LAB — GLUCOSE, CAPILLARY
Glucose-Capillary: 93 mg/dL (ref 70–99)
Glucose-Capillary: 70 mg/dL (ref 70–99)

## 2020-12-08 LAB — POCT ACTIVATED CLOTTING TIME
Activated Clotting Time: 267 seconds
Activated Clotting Time: 309 seconds
Activated Clotting Time: 172 seconds

## 2020-12-08 SURGERY — LOWER EXTREMITY ANGIOGRAPHY
Anesthesia: LOCAL | Laterality: Left

## 2020-12-08 MED ORDER — ONDANSETRON HCL 4 MG/2ML IJ SOLN: 4.0000 mg | Freq: Four times a day (QID) | INTRAMUSCULAR | Status: DC | PRN

## 2020-12-08 MED ORDER — SPIRONOLACTONE 25 MG PO TABS
25.0000 mg | ORAL_TABLET | Freq: Every day | ORAL | Status: DC
  Administered 2020-12-08 – 2020-12-09 (×2): 25 mg via ORAL
  Filled 2020-12-08 (×2): qty 1

## 2020-12-08 MED ORDER — ATORVASTATIN CALCIUM 40 MG PO TABS: 40.0000 mg | ORAL_TABLET | Freq: Every day | ORAL | Status: DC

## 2020-12-08 MED ORDER — NITROGLYCERIN 1 MG/10 ML FOR IR/CATH LAB
INTRA_ARTERIAL | Status: DC | PRN
  Administered 2020-12-08: 200 ug

## 2020-12-08 MED ORDER — HYDRALAZINE HCL 20 MG/ML IJ SOLN
5.0000 mg | INTRAMUSCULAR | Status: DC | PRN
  Administered 2020-12-08: 5 mg via INTRAVENOUS
  Filled 2020-12-08: qty 1

## 2020-12-08 MED ORDER — CLOPIDOGREL BISULFATE 75 MG PO TABS: 75.0000 mg | ORAL_TABLET | Freq: Every day | ORAL | Status: DC

## 2020-12-08 MED ORDER — MIDAZOLAM HCL 2 MG/2ML IJ SOLN
INTRAMUSCULAR | Status: DC | PRN
  Administered 2020-12-08: 1 mg via INTRAVENOUS

## 2020-12-08 MED ORDER — ASPIRIN EC 81 MG PO TBEC: 81.0000 mg | DELAYED_RELEASE_TABLET | Freq: Every day | ORAL | Status: DC

## 2020-12-08 MED ORDER — VERAPAMIL HCL 2.5 MG/ML IV SOLN
INTRAVENOUS | Status: AC
  Filled 2020-12-08: qty 2

## 2020-12-08 MED ORDER — HEPARIN SODIUM (PORCINE) 1000 UNIT/ML IJ SOLN
INTRAMUSCULAR | Status: DC | PRN
  Administered 2020-12-08: 5500 [IU] via INTRAVENOUS
  Administered 2020-12-08: 2000 [IU] via INTRAVENOUS

## 2020-12-08 MED ORDER — SODIUM CHLORIDE 0.9 % IV SOLN: 250.0000 mL | INTRAVENOUS | Status: DC | PRN

## 2020-12-08 MED ORDER — SODIUM CHLORIDE 0.9% FLUSH: 3.0000 mL | INTRAVENOUS | Status: DC | PRN

## 2020-12-08 MED ORDER — MIDAZOLAM HCL 2 MG/2ML IJ SOLN
INTRAMUSCULAR | Status: AC
  Filled 2020-12-08: qty 2

## 2020-12-08 MED ORDER — SODIUM CHLORIDE 0.9 % IV SOLN: INTRAVENOUS | Status: AC

## 2020-12-08 MED ORDER — SODIUM CHLORIDE 0.9 % WEIGHT BASED INFUSION
3.0000 mL/kg/h | INTRAVENOUS | Status: DC
  Administered 2020-12-08: 3 mL/kg/h via INTRAVENOUS

## 2020-12-08 MED ORDER — ACETAMINOPHEN 325 MG PO TABS: 650.0000 mg | ORAL_TABLET | ORAL | Status: DC | PRN

## 2020-12-08 MED ORDER — HYDRALAZINE HCL 20 MG/ML IJ SOLN
INTRAMUSCULAR | Status: DC | PRN
  Administered 2020-12-08: 10 mg via INTRAVENOUS

## 2020-12-08 MED ORDER — FENTANYL CITRATE (PF) 100 MCG/2ML IJ SOLN
INTRAMUSCULAR | Status: DC | PRN
  Administered 2020-12-08: 25 ug via INTRAVENOUS

## 2020-12-08 MED ORDER — FENTANYL CITRATE (PF) 100 MCG/2ML IJ SOLN
INTRAMUSCULAR | Status: AC
  Filled 2020-12-08: qty 2

## 2020-12-08 MED ORDER — PANTOPRAZOLE SODIUM 40 MG PO TBEC
40.0000 mg | DELAYED_RELEASE_TABLET | Freq: Every day | ORAL | Status: DC
  Administered 2020-12-09: 40 mg via ORAL
  Filled 2020-12-08: qty 1

## 2020-12-08 MED ORDER — LIDOCAINE HCL (PF) 1 % IJ SOLN
INTRAMUSCULAR | Status: DC | PRN
  Administered 2020-12-08: 18 mL

## 2020-12-08 MED ORDER — SODIUM CHLORIDE 0.9 % WEIGHT BASED INFUSION: 1.0000 mL/kg/h | INTRAVENOUS | Status: DC

## 2020-12-08 MED ORDER — VIPERSLIDE LUBRICANT OPTIME: TOPICAL | Status: DC | PRN

## 2020-12-08 MED ORDER — SODIUM CHLORIDE 0.9% FLUSH
3.0000 mL | Freq: Two times a day (BID) | INTRAVENOUS | Status: DC
  Administered 2020-12-08 (×2): 3 mL via INTRAVENOUS

## 2020-12-08 MED ORDER — NITROGLYCERIN IN D5W 200-5 MCG/ML-% IV SOLN
INTRAVENOUS | Status: AC
  Filled 2020-12-08: qty 250

## 2020-12-08 MED ORDER — ATORVASTATIN CALCIUM 80 MG PO TABS
80.0000 mg | ORAL_TABLET | Freq: Every day | ORAL | Status: DC
  Administered 2020-12-08: 80 mg via ORAL
  Filled 2020-12-08: qty 1

## 2020-12-08 MED ORDER — MORPHINE SULFATE (PF) 2 MG/ML IV SOLN
2.0000 mg | INTRAVENOUS | Status: DC | PRN
Start: 2020-12-08 — End: 2020-12-09

## 2020-12-08 MED ORDER — HEPARIN SODIUM (PORCINE) 1000 UNIT/ML IJ SOLN
INTRAMUSCULAR | Status: AC
  Filled 2020-12-08: qty 1

## 2020-12-08 MED ORDER — ASPIRIN 81 MG PO CHEW: 81.0000 mg | CHEWABLE_TABLET | ORAL | Status: DC

## 2020-12-08 MED ORDER — HYDRALAZINE HCL 20 MG/ML IJ SOLN
INTRAMUSCULAR | Status: AC
  Filled 2020-12-08: qty 1

## 2020-12-08 MED ORDER — ASPIRIN EC 81 MG PO TBEC
81.0000 mg | DELAYED_RELEASE_TABLET | Freq: Every day | ORAL | Status: DC
  Administered 2020-12-09: 81 mg via ORAL
  Filled 2020-12-08: qty 1

## 2020-12-08 MED ORDER — HEPARIN (PORCINE) IN NACL 1000-0.9 UT/500ML-% IV SOLN
INTRAVENOUS | Status: AC
  Filled 2020-12-08: qty 1000

## 2020-12-08 MED ORDER — IRBESARTAN 150 MG PO TABS
150.0000 mg | ORAL_TABLET | Freq: Every day | ORAL | Status: DC
  Administered 2020-12-08 – 2020-12-09 (×2): 150 mg via ORAL
  Filled 2020-12-08 (×2): qty 1

## 2020-12-08 MED ORDER — HYDROCODONE-ACETAMINOPHEN 5-325 MG PO TABS: 1.0000 | ORAL_TABLET | Freq: Four times a day (QID) | ORAL | Status: DC | PRN

## 2020-12-08 MED ORDER — METOPROLOL SUCCINATE ER 25 MG PO TB24
25.0000 mg | ORAL_TABLET | Freq: Every day | ORAL | Status: DC
  Administered 2020-12-08 – 2020-12-09 (×2): 25 mg via ORAL
  Filled 2020-12-08 (×2): qty 1

## 2020-12-08 MED ORDER — HEPARIN (PORCINE) IN NACL 1000-0.9 UT/500ML-% IV SOLN
INTRAVENOUS | Status: DC | PRN
  Administered 2020-12-08 (×2): 500 mL

## 2020-12-08 MED ORDER — LABETALOL HCL 5 MG/ML IV SOLN
10.0000 mg | INTRAVENOUS | Status: DC | PRN
Start: 2020-12-08 — End: 2020-12-09
  Administered 2020-12-08: 10 mg via INTRAVENOUS
  Filled 2020-12-08 (×2): qty 4

## 2020-12-08 MED ORDER — FERROUS SULFATE 325 (65 FE) MG PO TABS
325.0000 mg | ORAL_TABLET | Freq: Every day | ORAL | Status: DC
  Administered 2020-12-08 – 2020-12-09 (×2): 325 mg via ORAL
  Filled 2020-12-08 (×2): qty 1

## 2020-12-08 MED ORDER — LIDOCAINE HCL (PF) 1 % IJ SOLN
INTRAMUSCULAR | Status: AC
  Filled 2020-12-08: qty 30

## 2020-12-08 MED ORDER — CLOPIDOGREL BISULFATE 75 MG PO TABS
75.0000 mg | ORAL_TABLET | Freq: Every day | ORAL | Status: DC
  Administered 2020-12-09: 75 mg via ORAL
  Filled 2020-12-08: qty 1

## 2020-12-08 MED ORDER — IODIXANOL 320 MG/ML IV SOLN
INTRAVENOUS | Status: DC | PRN
  Administered 2020-12-08: 175 mL

## 2020-12-08 SURGICAL SUPPLY — 25 items
BALLN MUSTANG 5.0X40 75 (BALLOONS) ×8
BALLOON MUSTANG 5.0X40 75 (BALLOONS) ×2 IMPLANT
CATH ANGIO 5F BER2 65CM (CATHETERS) ×2 IMPLANT
CATH ANGIO 5F PIGTAIL 65CM (CATHETERS) ×2 IMPLANT
CATH STRAIGHT 5FR 65CM (CATHETERS) ×2 IMPLANT
DEVICE CONTINUOUS FLUSH (MISCELLANEOUS) ×2 IMPLANT
DIAMONDBACK SOLID OAS 2.0MM (CATHETERS) ×4
KIT ENCORE 26 ADVANTAGE (KITS) ×2 IMPLANT
KIT PV (KITS) ×4 IMPLANT
LUBRICANT VIPERSLIDE CORONARY (MISCELLANEOUS) ×2 IMPLANT
SHEATH BRITE TIP 7FR 35CM (SHEATH) ×2 IMPLANT
SHEATH PINNACLE 5F 10CM (SHEATH) ×2 IMPLANT
SHEATH PINNACLE 7F 10CM (SHEATH) ×2 IMPLANT
SHEATH PROBE COVER 6X72 (BAG) ×2 IMPLANT
STENT VIABAHN 7X39X80 VBX (Permanent Stent) ×2 IMPLANT
STOPCOCK MORSE 400PSI 3WAY (MISCELLANEOUS) ×2 IMPLANT
SYR MEDRAD MARK 7 150ML (SYRINGE) ×4 IMPLANT
SYSTEM DIMNDBCK SLD OAS 2.0MM (CATHETERS) ×1 IMPLANT
TAPE VIPERTRACK RADIOPAQ (MISCELLANEOUS) ×1 IMPLANT
TAPE VIPERTRACK RADIOPAQUE (MISCELLANEOUS) ×4
TRANSDUCER W/STOPCOCK (MISCELLANEOUS) ×4 IMPLANT
TRAY PV CATH (CUSTOM PROCEDURE TRAY) ×4 IMPLANT
TUBING CIL FLEX 10 FLL-RA (TUBING) ×2 IMPLANT
WIRE HITORQ VERSACORE ST 145CM (WIRE) ×2 IMPLANT
WIRE VIPER ADVANCE .017X335CM (WIRE) ×2 IMPLANT

## 2020-12-08 NOTE — Progress Notes (Signed)
Site area: left groin  Site Prior to Removal:  Level 0  Pressure Applied For 50 MINUTES    Minutes Beginning at 1545  Manual:   Yes.    Patient Status During Pull:  stable  Post Pull Groin Site:  Level 1  Post Pull Instructions Given:  Yes.    Post Pull Pulses Present:  Yes.    Dressing Applied:  Yes.    Comments: bedrest begins at 1635 x 4 hours

## 2020-12-08 NOTE — Interval H&P Note (Signed)
History and Physical Interval Note:  12/08/2020 10:41 AM  Christine Meyer  has presented today for surgery, with the diagnosis of claudication.  The various methods of treatment have been discussed with the patient and family. After consideration of risks, benefits and other options for treatment, the patient has consented to  Procedure(s): ABDOMINAL AORTOGRAM W/LOWER EXTREMITY (N/A) as a surgical intervention.  The patient's history has been reviewed, patient examined, no change in status, stable for surgery.  I have reviewed the patient's chart and labs.  Questions were answered to the patient's satisfaction.     Quay Burow

## 2020-12-09 ENCOUNTER — Other Ambulatory Visit: Payer: Self-pay | Admitting: Family Medicine

## 2020-12-09 DIAGNOSIS — I252 Old myocardial infarction: Secondary | ICD-10-CM | POA: Diagnosis not present

## 2020-12-09 DIAGNOSIS — Z79899 Other long term (current) drug therapy: Secondary | ICD-10-CM | POA: Diagnosis not present

## 2020-12-09 DIAGNOSIS — E785 Hyperlipidemia, unspecified: Secondary | ICD-10-CM | POA: Diagnosis not present

## 2020-12-09 DIAGNOSIS — I70213 Atherosclerosis of native arteries of extremities with intermittent claudication, bilateral legs: Secondary | ICD-10-CM | POA: Diagnosis not present

## 2020-12-09 DIAGNOSIS — Z7902 Long term (current) use of antithrombotics/antiplatelets: Secondary | ICD-10-CM | POA: Diagnosis not present

## 2020-12-09 DIAGNOSIS — I739 Peripheral vascular disease, unspecified: Secondary | ICD-10-CM | POA: Diagnosis not present

## 2020-12-09 DIAGNOSIS — Z95828 Presence of other vascular implants and grafts: Secondary | ICD-10-CM | POA: Diagnosis not present

## 2020-12-09 DIAGNOSIS — Z7982 Long term (current) use of aspirin: Secondary | ICD-10-CM | POA: Diagnosis not present

## 2020-12-09 DIAGNOSIS — I10 Essential (primary) hypertension: Secondary | ICD-10-CM | POA: Diagnosis not present

## 2020-12-09 DIAGNOSIS — F17210 Nicotine dependence, cigarettes, uncomplicated: Secondary | ICD-10-CM | POA: Diagnosis not present

## 2020-12-09 LAB — BASIC METABOLIC PANEL
Anion gap: 6 (ref 5–15)
BUN: 12 mg/dL (ref 8–23)
CO2: 22 mmol/L (ref 22–32)
Calcium: 8.4 mg/dL — ABNORMAL LOW (ref 8.9–10.3)
Chloride: 110 mmol/L (ref 98–111)
Creatinine, Ser: 0.99 mg/dL (ref 0.44–1.00)
GFR, Estimated: >60 mL/min (ref >60)
Glucose, Bld: 102 mg/dL — ABNORMAL HIGH (ref 70–99)
Potassium: 3.6 mmol/L (ref 3.5–5.1)
Sodium: 138 mmol/L (ref 135–145)

## 2020-12-09 LAB — CBC
HCT: 29.6 % — ABNORMAL LOW (ref 36.0–46.0)
Hemoglobin: 9.7 g/dL — ABNORMAL LOW (ref 12.0–15.0)
MCH: 29 pg (ref 26.0–34.0)
MCHC: 32.8 g/dL (ref 30.0–36.0)
MCV: 88.6 fL (ref 80.0–100.0)
Platelets: 179 10*3/uL (ref 150–400)
RBC: 3.34 MIL/uL — ABNORMAL LOW (ref 3.87–5.11)
RDW: 14.6 % (ref 11.5–15.5)
WBC: 5.4 10*3/uL (ref 4.0–10.5)
nRBC: 0 % (ref 0.0–0.2)

## 2020-12-09 LAB — POCT ACTIVATED CLOTTING TIME: Activated Clotting Time: 249 seconds

## 2020-12-09 MED ORDER — ATORVASTATIN CALCIUM 80 MG PO TABS: 80.0000 mg | ORAL_TABLET | Freq: Every day | ORAL | 6 refills | Status: DC

## 2020-12-09 NOTE — Discharge Instructions (Addendum)
Femoral Site Care  This sheet gives you information about how to care for yourself after your procedure. Your health care provider may also give you more specific instructions. If you have problems or questions, contact your health care provider. What can I expect after the procedure? After the procedure, it is common to have:  Bruising that usually fades within 1-2 weeks.  Tenderness at the site. Follow these instructions at home: Wound care  Follow instructions from your health care provider about how to take care of your insertion site. Make sure you: ? Wash your hands with soap and water before you change your bandage (dressing). If soap and water are not available, use hand sanitizer. ? Change your dressing as told by your health care provider. ? Leave stitches (sutures), skin glue, or adhesive strips in place. These skin closures may need to stay in place for 2 weeks or longer. If adhesive strip edges start to loosen and curl up, you may trim the loose edges. Do not remove adhesive strips completely unless your health care provider tells you to do that.  Do not take baths, swim, or use a hot tub until your health care provider approves.  You may shower 24-48 hours after the procedure or as told by your health care provider. ? Gently wash the site with plain soap and water. ? Pat the area dry with a clean towel. ? Do not rub the site. This may cause bleeding.  Do not apply powder or lotion to the site. Keep the site clean and dry.  Check your femoral site every day for signs of infection. Check for: ? Redness, swelling, or pain. ? Fluid or blood. ? Warmth. ? Pus or a bad smell. Activity  For the first 2-3 days after your procedure, or as long as directed: ? Avoid climbing stairs as much as possible. ? Do not squat.  Do not lift anything that is heavier than 10 lb (4.5 kg), or the limit that you are told, until your health care provider says that it is safe.  Rest as  directed. ? Avoid sitting for a long time without moving. Get up to take short walks every 1-2 hours.  Do not drive for 24 hours if you were given a medicine to help you relax (sedative). General instructions  Take over-the-counter and prescription medicines only as told by your health care provider.  Keep all follow-up visits as told by your health care provider. This is important. Contact a health care provider if you have:  A fever or chills.  You have redness, swelling, or pain around your insertion site. Get help right away if:  The catheter insertion area swells very fast.  You pass out.  You suddenly start to sweat or your skin gets clammy.  The catheter insertion area is bleeding, and the bleeding does not stop when you hold steady pressure on the area.  The area near or just beyond the catheter insertion site becomes pale, cool, tingly, or numb. These symptoms may represent a serious problem that is an emergency. Do not wait to see if the symptoms will go away. Get medical help right away. Call your local emergency services (911 in the [place] Do not drive yourself to the hospital. Summary  After the procedure, it is common to have bruising that usually fades within 1-2 weeks.  Check your femoral site every day for signs of infection.  Do not lift anything that is heavier than 10 lb (4.5 kg), or   the limit that you are told, until your health care provider says that it is safe. This information is not intended to replace advice given to you by your health care provider. Make sure you discuss any questions you have with your health care provider. Document Revised: 04/29/2020 Document Reviewed: 04/29/2020 Elsevier Patient Education  [place].   Peripheral Vascular Disease  Peripheral vascular disease (PVD) is a disease of the blood vessels that carry blood from the heart to the rest of the body. PVD is also called peripheral artery disease (PAD) or poor  circulation. PVD affects most of the body. But it affects the legs and feet the most. PVD can lead to acute limb ischemia. This happens when there is a sudden stop of blood flow to an arm or leg. This is a medical emergency. What are the causes? The most common cause of PVD is a buildup of a fatty substance (plaque) inside your arteries. This decreases blood flow. Plaque can break off and block blood in a smaller artery. This can lead to acute limb ischemia. Other common causes of PVD include:  Blood clots inside the blood vessels.  Injuries to blood vessels.  Irritation and swelling of blood vessels.  Sudden tightening of the blood vessel (spasms). What increases the risk?  A family history of PVD.  Medical conditions, including: ? High cholesterol. ? Diabetes. ? High blood pressure. ? Heart disease. ? Past problems with blood clots. ? Past injury, such as burns or a broken bone.  Other conditions, such as: ? Buerger's disease. This is caused by swollen or irritated blood vessels in your hands and feet. ? Arthritis. ? Birth defects that affect the arteries in your legs. ? Kidney disease.  Using tobacco or nicotine products.  Not getting enough exercise.  Being very overweight (obese).  Being 106 years old or older. What are the signs or symptoms?  Cramps in your butt, legs, and feet.  Pain and weakness in your legs when you are active that goes away when you rest.  Leg pain when at rest.  Leg numbness, tingling, or weakness.  Coldness in a leg or foot, especially when compared with the other leg or foot.  Skin or hair changes. These can include: ? Hair loss. ? Shiny skin. ? Pale or bluish skin. ? Thick toenails.  Being unable to get or keep an erection.  Tiredness (fatigue).  Weak pulse or no pulse in the feet.  Wounds and sores on the toes, feet, or legs. These take longer to heal. How is this treated? Underlying causes are treated first. Other  conditions, like diabetes, high cholesterol, and blood pressure, are also treated. Treatment may include:  Lifestyle changes, such as: ? Quitting smoking. ? Getting regular exercise. ? Having a diet low in fat and cholesterol. ? Not drinking alcohol.  Taking medicines, such as: ? Blood thinners. ? Medicines to improve blood flow. ? Medicines to improve your blood cholesterol.  Procedures to: ? Open the arteries and restore blood flow. ? Insert a small mesh tube (stent) to keep a blocked vessel open. ? Create a new path for blood to flow to the body (peripheral bypass). ? Remove dead tissue from a wound. ? Remove an affected leg or arm. Follow these instructions at home: Medicines  Take over-the-counter and prescription medicines only as told by your doctor.  If you are taking blood thinners: ? Talk with your doctor before you take any medicines that have aspirin, or NSAIDs,  such as ibuprofen. ? Take medicines exactly as told. Take them at the same time each day. ? Avoid doing things that could hurt or bruise you. Take action to prevent falls. ? Wear an alert bracelet or carry a card that shows you are taking blood thinners. Lifestyle  Get regular exercise. Ask your doctor about how to stay active.  Talk with your doctor about keeping a healthy weight. If needed, ask about losing weight.  Eat a diet that is low in fat and cholesterol. If you need help, talk with your doctor.  Do not drink alcohol.  Do not smoke or use any products that contain nicotine or tobacco. If you need help quitting, ask your doctor.      General instructions  Take good care of your feet. To do this: ? Wear shoes that fit well and feel good. ? Check your feet often for any cuts or sores.  Get a flu shot (influenza vaccine) each year.  Keep all follow-up visits. Where to find more information  Society for Vascular Surgery: vascular.org  American Heart Association: heart.org  National  Heart, Lung, and Blood Institute: https://www.hartman-hill.biz/ Contact a doctor if:  You have cramps in your legs when you walk.  You have leg pain when you rest.  Your leg or foot feels cold.  Your skin changes.  You cannot get or keep an erection.  You have cuts or sores on your legs or feet that do not heal. Get help right away if:  You have sudden changes in the color and feeling of your arms or legs, such as: ? Your arm or leg turns cold, numb, and blue. ? Your arm or leg becomes red, warm, swollen, painful, or numb.  You have any signs of a stroke. "BE FAST" is an easy way to remember the main warning signs: ? B - Balance. Dizziness, sudden trouble walking, or loss of balance. ? E - Eyes. Trouble seeing or a change in how you see. ? F - Face. Sudden weakness or loss of feeling of the face. The face or eyelid may droop on one side. ? A - Arms. Weakness or loss of feeling in an arm. This happens all of a sudden and most often on one side of the body. ? S - Speech. Sudden trouble speaking, slurred speech, or trouble understanding what people say. ? T - Time. Time to call emergency services. Write down what time symptoms started.  You have other signs of a stroke, such as: ? A sudden, very bad headache with no known cause. ? Feeling like you may vomit (nausea). ? Vomiting. ? A seizure.  You have chest pain or trouble breathing. These symptoms may be an emergency. Get help right away. Call your local emergency services (911 in the [place]  Do not wait to see if the symptoms will go away.  Do not drive yourself to the hospital. Summary  Peripheral vascular disease (PVD) is a disease of the blood vessels.  PVD affects the legs and feet the most.  Symptoms may include leg pain or leg numbness, tingling, and weakness.  Treatment may include lifestyle changes, medicines, and procedures. This information is not intended to replace advice given to you by your health care provider. Make  sure you discuss any questions you have with your health care provider. Document Revised: 02/29/2020 Document Reviewed: 02/29/2020 Elsevier Patient Education  2021 [place] about your medication: Statin (cholesterol-lowering agent)  Generic Name (Wolf): atorvastatin (Lipitor) PURPOSE:  You are taking this medication to lower your "bad" cholesterol (LDL) and to prevent heart attacks and strokes. Statins can also raise your "good" cholesterol (HDL).  Common SIDE EFFECTS you may experience include: muscle pain or weakness (especially in the legs) and upset stomach.  Take your medication exactly as prescribed. Do not eat large amounts of grapefruit or grapefruit juice while taking this medication.  Contact your health care provider if you experience: severe muscle pain that does not improve, dark urine, or yellowing of your skin or eyes. ----------------------------------------------------------------------------------------------------------------------

## 2020-12-09 NOTE — Progress Notes (Signed)
Pt is alert and oriented. Discharge instructions/ AVS given to pt. 

## 2020-12-09 NOTE — Discharge Summary (Signed)
Discharge Summary    Patient ID: Christine Meyer MRN: [id number]; DOB: 01-Apr-1951  Admit date: 12/08/2020 Discharge date: 12/09/2020  PCP:  Lowne Chase, [facility]  Cardiologist:  Quay Burow, MD    Discharge Diagnoses    Active Problems:   Peripheral arterial disease ([place])   Claudication in peripheral vascular disease ([place]) Hyperlipidemia CAD Ongoing tobacco smoking   Diagnostic Studies/Procedures    Procedures Performed:               1.  Ultrasound-guided left common femoral access               2.  Abdominal aortogram/bilateral iliac angiogram/bifemoral runoff               3.  Diamondback orbital rotational arthrectomy left common iliac artery               4.  VBX covered stenting left common iliac artery  Angiographic Data:   1: Abdominal aorta-fluoroscopically calcified but widely patent 2: Right lower extremity-the right common iliac artery stent was widely patent.  The right SFA was occluded at its origin reconstituting in the adductor canal with three-vessel runoff 3: Left lower extremity-90% ostial/proximal and mid left common iliac artery stenosis.  50 to 60% left external iliac artery stenosis.  There was a focal 70 to 80% left common femoral artery stenosis.  The left SFA was occluded at its origin, reconstituting in the adductor canal by profunda femoris collaterals with three-vessel runoff  IMPRESSION: This Christine Meyer has high-grade calcified ostial and proximal left common iliac artery stenosis with a total left SFA.  We will proceed with orbital atherectomy and covered stenting.  Procedure Description: The short 5 French sheath was exchanged over a Biomedical scientist for a 7 [place] Brite tip sheath.  The patient received 7500's of heparin with ending ACT of 309.  A total of 165 cc of contrast was administered to the patient.  She was already on aspirin Plavix.  Using a 5 French short endhole catheter I exchanged the 035 versa  core wire for a 017 Viper wire.  I then performed orbital atherectomy using a 2 mm solid Burr performing multiple runs of 220,000 RPMs.  I then exchanged back for a 035 versa core wire and predilated the left common iliac artery with a 5 mm x 4 cm balloon.  I then deployed a 7 mm x 30 mm long VBX covered stent at 12 atm at the origin of the left common iliac artery resulting in reduction of 90% ostial/proximal calcified left common iliac artery stenosis to 0% residual.  The patient tolerated procedure well.  The Brite-tip sheath was exchanged over the 035 wire for short 7 French sheath which was secured in place.  Final Impression: Successful orbital atherectomy, VBX covered stenting of highly calcified ostial/proximal left common iliac artery with a total left SFA and three-vessel runoff.  Patient tolerated procedure well.  The sheath will be removed once ACT falls below 170 impression will be held.  She will be hydrated overnight, discharged home in the morning.  We will get lower extremity arterial Doppler studies in our [facility] line office next week and I will see her back the week after.   History of Present Illness     Christine Meyer is a 70 y.o. female with history of CAD s/p RCA stenting in 1999, hypertension, hyperlipidemia, peripheral vascular disease, carotid artery disease, anemia and ongoing tobacco smoking presented  for outpatient preview procedure.  Angiogram 11/22/14 revealing an occluded right renal artery stent and high-grade ostial right common iliac artery stenosis as well as right external iliac artery "in-stent restenosis. Dr. Gwenlyn Found stented both of these areas. She did have an occluded left SFA with high-grade segmental diffuse right SFA stenosis.  She underwent successful SMA PTA and covered stenting using a VBX covered stent on 10/03/20.  Drop in her hemoglobin postop.  No retroperitoneal bleed.  Seen by Dr. Gwenlyn Found March 25th  complaint left lower extremity pain and numbness.   Symptoms consistent with claudication and set up for angiography.  Hospital Course     Consultants: None  Patient underwent successful orbital atherectomy, VBX covered stenting of highly calcified ostial/proximal left common iliac artery with a total left SFA and three-vessel runoff.  Patient tolerated procedure well.  No overnight complication.  Renal function and hemoglobin are stable. No groin hematoma.  Lipitor increased to 80 mg daily.  Dual antiplatelet therapy with aspirin and Plavix.  Strongly recommended smoking cessation.  The patient been seen by Dr. Ellyn Hack  today and deemed ready for discharge home. All follow-up appointments have been scheduled. Discharge medications are listed below.    Discharge Vitals Blood pressure (!) 119/44, pulse 73, temperature 98.6 F (37 C), temperature source Oral, resp. rate 16, height _0  (1.575 m), weight 51.8 kg, SpO2 100 %.  Filed Weights   12/08/20 0735 12/09/20 0524  Weight: 52.2 kg 51.8 kg   Physical Exam Constitutional:      Appearance: Normal appearance.  HENT:     Head: Normocephalic and atraumatic.     Nose: Nose normal.  Eyes:     Extraocular Movements: Extraocular movements intact.     Pupils: Pupils are equal, round, and reactive to light.  Cardiovascular:     Rate and Rhythm: Normal rate and regular rhythm.     Comments: Right groin cath site without hematoma Pulmonary:     Effort: Pulmonary effort is normal.     Breath sounds: Normal breath sounds.  Abdominal:     General: Abdomen is flat.     Palpations: Abdomen is soft.  Musculoskeletal:        General: Normal range of motion.     Cervical back: Normal range of motion.  Skin:    General: Skin is warm and dry.  Neurological:     General: No focal deficit present.     Mental Status: She is alert and oriented to person, place, and time.  Psychiatric:        Mood and Affect: Mood normal.        Behavior: Behavior normal.    Labs & Radiologic Studies     CBC Recent Labs    12/09/20 0234  WBC 5.4  HGB 9.7*  HCT 29.6*  MCV 88.6  PLT 417   Basic Metabolic Panel Recent Labs    12/09/20 0234  NA 138  K 3.6  CL 110  CO2 22  GLUCOSE 102*  BUN 12  CREATININE 0.99  CALCIUM 8.4*   _____________  PERIPHERAL VASCULAR CATHETERIZATION  Result Date: 12/08/2020  [id number] LOCATION:  FACILITY: [facility] PHYSICIAN: Quay Burow, M.D. 1950/09/30 DATE OF PROCEDURE:  12/08/2020 DATE OF DISCHARGE: PV Angiogram/Intervention History obtained from chart review.Sheketa Ende is a 70 y.o.  thin appearing single African-American female mother of 4 children . She was referred by Dr. Percival Spanish , her cardiologist, for peripheral vascular evaluation. I last saw her in theoffice 10/19/2020. She has a  history of myocardial infarction back in 1999 undergoing stenting of her RCA by Dr. Melvern Banker.her chronic risk factors are notable for continued tobacco abuse, treated hypertension and hyperlipidemia. She is complained of increasing dyspnea on exertion over the last 6 months as well as right calf claudication. Dr. Percival Spanish saw her and ordered a stress test. 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 She has had right renal artery stenting in the past as well as right external iliac artery stenting as well. 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 I stented both of these areas. She did have an occluded left SFA with high-grade segmental diffuse right SFA stenosis. Her Dopplers and symptoms improved.  She had been complainingof some abdominal pain and is experiencing some weight loss. She saw Dr. Carlean Purl, her gastroenterologist who obtained an abdominal CTA and referred her to Dr. Stanford Breed, vascular surgeon for consideration of peripheral angiography and treatment of  mesenteric ischemia. She had carotid Dopplers that showed moderate left ICA stenosis and lower extremity Dopplers performed today that suggested in-stent restenosis within her right iliac stent.  I performed angiography on her 10/03/2020 revealing patent right iliac stent, total SFAs bilaterally, 90% calcified proximal left common iliac artery stenosis, subtotally occluded celiac axis at the origin and 90% SMA which I stented successfully. She was discharged home the following day. She did have a slight drop in her hemoglobin but no evidence of hematoma or retroperitoneal bleed. Her follow-up Dopplers performed a week later showed a widely patent SMA. Her abdominal pain resolved after the intervention and she had dinner in the hospital that evening without discomfort. She states since gained 5 pounds. She does complain of left lower extremity claudication however wishes to have this intervened on in the near future.  Since I saw her 6 weeks ago she is back today to discuss her left lower extremity.  She complains of pain and numbness in her left leg and foot with left lower extremity greater than right lower extremity claudication and wishes to proceed with angiography and percutaneous intervention. Pre Procedure Diagnosis: Peripheral arterial disease Post Procedure Diagnosis: Peripheral arterial disease Operators: Dr. Quay Burow Procedures Performed:  1.  Ultrasound-guided left common femoral access  2.  Abdominal aortogram/bilateral iliac angiogram/bifemoral runoff  3.  Diamondback orbital rotational arthrectomy left common iliac artery  4.  VBX covered stenting left common iliac artery PROCEDURE DESCRIPTION: The patient was brought to the second floor [facility] cath lab in the the postabsorptive state. She was premedicated with IV Versed and fentanyl. Her left groin was prepped and shaved in usual sterile fashion. Xylocaine 1% was used for local anesthesia. A 5 French sheath was inserted into  the left common femoral artery using standard Seldinger technique.  Ultrasound was used to identify the left common femoral artery and guide access.  A digital image was captured and placed in the patient's chart.  A 5 French pigtail catheters placed the distal abdominal aorta.  Distal abdominal aortography, bilateral iliac angiography with bifemoral runoff was performed using bolus chase, digital subtraction and step table technique.  Omnipaque dye was used for the entirety of the case.  Retrograde aortic pressures monitored in the case.  Angiographic Data: 1: Abdominal aorta-fluoroscopically calcified but widely patent 2: Right lower extremity-the right common iliac artery stent was widely patent.  The  right SFA was occluded at its origin reconstituting in the adductor canal with three-vessel runoff 3: Left lower extremity-90% ostial/proximal and mid left common iliac artery stenosis.  50 to 60% left external iliac artery stenosis.  There was a focal 70 to 80% left common femoral artery stenosis.  The left SFA was occluded at its origin, reconstituting in the adductor canal by profunda femoris collaterals with three-vessel runoff   This Christine Meyer has high-grade calcified ostial and proximal left common iliac artery stenosis with a total left SFA.  We will proceed with orbital atherectomy and covered stenting. Procedure Description: The short 5 French sheath was exchanged over a Biomedical scientist for a 7 [place] Brite tip sheath.  The patient received 7500's of heparin with ending ACT of 309.  A total of 165 cc of contrast was administered to the patient.  She was already on aspirin Plavix.  Using a 5 French short endhole catheter I exchanged the 035 versa core wire for a 017 Viper wire.  I then performed orbital atherectomy using a 2 mm solid Burr performing multiple runs of 220,000 RPMs.  I then exchanged back for a 035 versa core wire and predilated the left common iliac artery with a 5 mm x 4 cm balloon.  I then  deployed a 7 mm x 30 mm long VBX covered stent at 12 atm at the origin of the left common iliac artery resulting in reduction of 90% ostial/proximal calcified left common iliac artery stenosis to 0% residual.  The patient tolerated procedure well.  The Brite-tip sheath was exchanged over the 035 wire for short 7 French sheath which was secured in place. Final Impression: Successful orbital atherectomy, VBX covered stenting of highly calcified ostial/proximal left common iliac artery with a total left SFA and three-vessel runoff.  Patient tolerated procedure well.  The sheath will be removed once ACT falls below 170 impression will be held.  She will be hydrated overnight, discharged home in the morning.  We will get lower extremity arterial Doppler studies in our [facility] line office next week and I will see her back the week after. Quay Burow. MD, [facility] 12/08/2020 12:13 PM   Disposition   Pt is being discharged home today in good condition.  Follow-up Plans & Appointments     Follow-up Information    [facility]. Go on 12/20/2020.   Specialty: Cardiology Why: @ 9 am for vascular study  Contact information: [address] [place] [phone]       Lorretta Harp, MD. Go on 12/28/2020.   Specialties: Cardiology, Radiology Why: _0 :15pm for follow up Contact information: [address] [place] [address] [phone]              Discharge Instructions    Diet - low sodium heart healthy   Complete by: As directed    Discharge instructions   Complete by: As directed    No driving for . No lifting over 5 lbs for 1 week. No sexual activity for 1 week. Keep procedure site clean & dry. If you notice increased pain, swelling, bleeding or pus, call/return!  You may shower, but no soaking baths/hot tubs/pools for 1 week.   Increase activity slowly   Complete by: As directed       Discharge Medications   Allergies  as of 12/09/2020   No Known Allergies     Medication List    STOP taking these medications   HYDROcodone-acetaminophen  5-325 MG tablet Commonly known as: NORCO/VICODIN   meloxicam 7.5 MG tablet Commonly known as: MOBIC   mirtazapine 15 MG tablet Commonly known as: REMERON     TAKE these medications   A Thru Z Select 50+ Advanced Tabs TAKE 1 TABLET BY MOUTH DAILY   Accu-Chek Softclix Lancets lancets Check blood sugar once daily. Dx:E11.9   aspirin EC 81 MG tablet Take 1 tablet (81 mg total) by mouth daily.   atorvastatin 80 MG tablet Commonly known as: LIPITOR Take 1 tablet (80 mg total) by mouth daily. What changed:   medication strength  how much to take   Calcium 500-100 MG-UNIT Chew Chew 1 tablet by mouth daily before breakfast.   clopidogrel 75 MG tablet Commonly known as: PLAVIX TAKE 1 TABLET(75 MG) BY MOUTH DAILY What changed: See the new instructions.   cyanocobalamin 1000 MCG/ML injection Commonly known as: (VITAMIN B-12) Inject 1 mL (1,000 mcg total) into the muscle every 30 (thirty) days.   Esbriet 801 MG Tabs Generic drug: Pirfenidone TAKE 1 TABLET (801MG) BY  MOUTH THREE TIMES DAILY  WITH FOOD What changed: See the new instructions.   Iron (Ferrous Sulfate) 325 (65 Fe) MG Tabs Take 325 mg by mouth daily.   metoprolol succinate 25 MG 24 hr tablet Commonly known as: TOPROL-XL Take 1 tablet (25 mg total) by mouth daily.   olmesartan 20 MG tablet Commonly known as: BENICAR Take 1 tablet (20 mg total) by mouth daily.   OneTouch Verio test strip Generic drug: glucose blood USE AS DIRECTED DAILY   OneTouch Verio w/Device Kit Use daily to check blood sugar.   DX E11.9   pantoprazole 40 MG tablet Commonly known as: PROTONIX Take 1 tablet (40 mg total) by mouth daily before breakfast.   spironolactone 25 MG tablet Commonly known as: ALDACTONE Take 1 tablet (25 mg total) by mouth daily.   Stiolto Respimat 2.5-2.5 MCG/ACT Aers Generic  drug: Tiotropium Bromide-Olodaterol Inhale 2 puffs into the lungs daily.   SYRINGE-NEEDLE (DISP) 3 ML 25G X 1" 3 ML Misc Commonly known as: BD Integra Syringe USE TO INJECT B12 ONCE A MONTH   Vitamin D3 50 MCG (2000 UT) capsule Take 1 capsule (2,000 Units total) by mouth daily.          Outstanding Labs/Studies   PV study on 4/12  Duration of Discharge Encounter   Greater than 30 minutes including physician time.  Jarrett Soho, PA 12/09/2020, 8:03 AM

## 2020-12-20 ENCOUNTER — Other Ambulatory Visit: Payer: Self-pay

## 2020-12-20 ENCOUNTER — Ambulatory Visit (HOSPITAL_COMMUNITY)
Admission: RE | Admit: 2020-12-20 | Discharge: 2020-12-20 | Disposition: A | Discharge status: Home / Self Care | Payer: Medicare Other | Source: Ambulatory Visit | Attending: Cardiovascular Disease | Admitting: Cardiovascular Disease

## 2020-12-20 ENCOUNTER — Ambulatory Visit (HOSPITAL_BASED_OUTPATIENT_CLINIC_OR_DEPARTMENT_OTHER)
Admission: RE | Admit: 2020-12-20 | Discharge: 2020-12-20 | Disposition: A | Discharge status: Home / Self Care | Payer: Medicare Other | Source: Ambulatory Visit | Attending: Cardiovascular Disease | Admitting: Cardiovascular Disease

## 2020-12-20 ENCOUNTER — Other Ambulatory Visit: Payer: Self-pay | Admitting: Cardiovascular Disease

## 2020-12-20 ENCOUNTER — Other Ambulatory Visit: Payer: Self-pay | Admitting: Pulmonary Disease

## 2020-12-20 DIAGNOSIS — Z95820 Peripheral vascular angioplasty status with implants and grafts: Secondary | ICD-10-CM | POA: Diagnosis not present

## 2020-12-20 DIAGNOSIS — I739 Peripheral vascular disease, unspecified: Secondary | ICD-10-CM | POA: Insufficient documentation

## 2020-12-27 ENCOUNTER — Other Ambulatory Visit (HOSPITAL_BASED_OUTPATIENT_CLINIC_OR_DEPARTMENT_OTHER): Payer: Self-pay | Admitting: Family Medicine

## 2020-12-27 DIAGNOSIS — Z1231 Encounter for screening mammogram for malignant neoplasm of breast: Secondary | ICD-10-CM

## 2020-12-28 ENCOUNTER — Ambulatory Visit (INDEPENDENT_AMBULATORY_CARE_PROVIDER_SITE_OTHER): Payer: Medicare Other | Admitting: Cardiovascular Disease

## 2020-12-28 ENCOUNTER — Other Ambulatory Visit: Payer: Self-pay

## 2020-12-28 ENCOUNTER — Encounter: Payer: Self-pay | Admitting: Cardiovascular Disease

## 2020-12-28 VITALS — BP 147/72 | HR 72 | Ht 62.0 in | Wt 116.6 lb

## 2020-12-28 DIAGNOSIS — Z95828 Presence of other vascular implants and grafts: Secondary | ICD-10-CM

## 2020-12-28 DIAGNOSIS — I6523 Occlusion and stenosis of bilateral carotid arteries: Secondary | ICD-10-CM

## 2020-12-28 DIAGNOSIS — I739 Peripheral vascular disease, unspecified: Secondary | ICD-10-CM

## 2020-12-28 NOTE — Assessment & Plan Note (Signed)
History of moderate left ICA stenosis by duplex ultrasound 08/16/2020.  This will repeat the repeated in 1 year.

## 2020-12-28 NOTE — Progress Notes (Signed)
12/28/2020 Christine Meyer   1951-01-12  [id number]  Primary Physician Ann Held, DO Primary Cardiologist: Lorretta Harp MD [name], [place]  HPI:  Christine Meyer is a 70 y.o.  thin appearing single African-American female mother of 4 children . She was referred by Dr. Percival Spanish , her cardiologist, for peripheral vascular evaluation. I last saw her in theoffice  12/02/2020. She has a history of myocardial infarction back in 1999 undergoing stenting of her RCA by Dr. Melvern Banker.her chronic risk factors are notable for continued tobacco abuse, treated hypertension and hyperlipidemia. She is complained of increasing dyspnea on exertion over the last 6 months as well as right calf claudication. Dr. Percival Spanish saw her and ordered a stress test. 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 She has had right renal artery stenting in the past as well as right external iliac artery stenting as well. 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 I stented both of these areas. She did have an occluded left SFA with high-grade segmental diffuse right SFA stenosis. Her Dopplers and symptoms improved.  She had been complainingof some abdominal pain and is experiencing some weight loss. She saw Dr. Carlean Purl, her gastroenterologist who obtained an abdominal CTA and referred her to Dr. Stanford Breed, vascular surgeon for consideration of peripheral angiography and treatment of mesenteric ischemia. She had carotid Dopplers that showed moderate left ICA stenosis and lower extremity Dopplers performed today that suggested in-stent restenosis within her right iliac stent.  I performed angiography on her 10/03/2020 revealing patent right iliac stent, total SFAs bilaterally, 90% calcified proximal left  common iliac artery stenosis, subtotally occluded celiac axis at the origin and 90% SMA which I stented successfully. She was discharged home the following day. She did have a slight drop in her hemoglobin but no evidence of hematoma or retroperitoneal bleed. Her follow-up Dopplers performed a week later showed a widely patent SMA. Her abdominal pain resolved after the intervention and she had dinner in the hospital that evening without discomfort. She states since gained 5 pounds. She does complain of left lower extremity claudication however wishes to have this intervened on in the near future.  Since I saw her 3 weeks ago she was complaining of pain in her left leg and we ultimately decided to proceed with angiography and endovascular therapy 12/08/2020.  Performed orbital atherectomy followed by VBX covered stenting using a 7 mm x 39 mm long VBX stent with excellent angiographic result.  She went home the following day.  Her Dopplers have improved and her claudication has as well.   Current Meds  Medication Sig  . ACCU-CHEK SOFTCLIX LANCETS lancets Check blood sugar once daily. Dx:E11.9  . aspirin EC 81 MG tablet Take 1 tablet (81 mg total) by mouth daily.  Marland Kitchen atorvastatin (LIPITOR) 80 MG tablet Take 1 tablet (80 mg total) by mouth daily.  . Blood Glucose Monitoring Suppl (ONETOUCH VERIO) w/Device KIT Use daily to check blood sugar.   DX E11.9  . Calcium 500-100 MG-UNIT CHEW Chew 1 tablet by mouth daily before breakfast.  . Cholecalciferol (VITAMIN D3) 50 MCG (2000 UT) capsule Take 1 capsule (2,000 Units total) by mouth daily.  . clopidogrel (PLAVIX) 75 MG tablet TAKE 1 TABLET(75 MG) BY MOUTH DAILY (Patient taking differently: Take 75 mg by mouth  daily.)  . cyanocobalamin (,VITAMIN B-12,) 1000 MCG/ML injection Inject 1 mL (1,000 mcg total) into the muscle every 30 (thirty) days.  . ESBRIET 801 MG TABS TAKE 1 TABLET (801MG) BY  MOUTH THREE TIMES DAILY  WITH FOOD (Patient taking differently:  Take 801 mg by mouth 2 (two) times daily.)  . glucose blood (ONETOUCH VERIO) test strip USE AS DIRECTED DAILY  . Iron, Ferrous Sulfate, 325 (65 Fe) MG TABS Take 325 mg by mouth daily.  . metoprolol succinate (TOPROL-XL) 25 MG 24 hr tablet Take 1 tablet (25 mg total) by mouth daily.  . Multiple Vitamins-Minerals (A THRU Z SELECT 50+ ADVANCED) TABS TAKE 1 TABLET BY MOUTH DAILY (Patient taking differently: Take 1 tablet by mouth daily.)  . olmesartan (BENICAR) 20 MG tablet Take 1 tablet (20 mg total) by mouth daily.  . pantoprazole (PROTONIX) 40 MG tablet Take 1 tablet (40 mg total) by mouth daily before breakfast.  . spironolactone (ALDACTONE) 25 MG tablet Take 1 tablet (25 mg total) by mouth daily.  . SYRINGE-NEEDLE, DISP, 3 ML (BD INTEGRA SYRINGE) 25G X 1" 3 ML MISC USE TO INJECT B-12 ONCE A MONTH  . Tiotropium Bromide-Olodaterol (STIOLTO RESPIMAT) 2.5-2.5 MCG/ACT AERS Inhale 2 puffs into the lungs daily.   Current Facility-Administered Medications for the 12/28/20 encounter (Office Visit) with Lorretta Harp, MD  Medication  . sodium chloride flush (NS) 0.9 % injection 3 mL     No Known Allergies  Social History   Socioeconomic History  . Marital status: Single    Spouse name: Not on file  . Number of children: 4  . Years of education: Not on file  . Highest education level: Not on file  Occupational History  . 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    Employer: [employer]  Tobacco Use  . Smoking status: Current Every Day Smoker    Packs/day: 0.25    Years: 51.00    Pack years: 12.75    Types: Cigarettes  . Smokeless tobacco: Never Used  . Tobacco comment: currently smoking 2-3 cigs per day  Vaping Use  . Vaping Use: Never used  Substance and Sexual Activity  . Alcohol use: No    Alcohol/week: 0.0 standard drinks  . Drug use: No  . Sexual activity: Not Currently  Other Topics Concern  . Not on file  Social History  Narrative      Daughter [name] is CMA Financial controller primary care [place]   Retired Risk analyst   Smoker, no EtOH/drugs   Social Determinants of Radio broadcast assistant Strain: Not on [employer] Insecurity: Not on file  Transportation Needs: Not on file  Physical Activity: Not on file  Stress: Not on file  Social Connections: Not on file  Intimate Partner Violence: Not on file     Review of Systems: General: negative for chills, fever, night sweats or weight changes.  Cardiovascular: negative for chest pain, dyspnea on exertion, edema, orthopnea, palpitations, paroxysmal nocturnal dyspnea or shortness of breath Dermatological: negative for rash Respiratory: negative for cough or wheezing Urologic: negative for hematuria Abdominal: negative for nausea, vomiting, diarrhea, bright red blood per rectum, melena, or hematemesis Neurologic: negative for visual changes, syncope, or dizziness All other systems reviewed and are otherwise negative except as noted above.    Blood pressure (!) 147/72, pulse 72, height 5' 2"  (1.575 m), weight 116 lb 9.6 oz (52.9 kg), SpO2 99 %.  General appearance: alert  and no distress Neck: no adenopathy, no JVD, supple, symmetrical, trachea midline, thyroid not enlarged, symmetric, no tenderness/mass/nodules and Soft left carotid bruit Lungs: clear to auscultation bilaterally Heart: regular rate and rhythm, S1, S2 normal, no murmur, click, rub or gallop Extremities: extremities normal, atraumatic, no cyanosis or edema Pulses: 2+ and symmetric Absent pedal pulses bilaterally Skin: Skin color, texture, turgor normal. No rashes or lesions Neurologic: Alert and oriented X 3, normal strength and tone. Normal symmetric reflexes. Normal coordination and gait  EKG not performed today  ASSESSMENT AND PLAN:   Bilateral carotid artery disease (HCC) History of moderate left ICA stenosis by duplex ultrasound 08/16/2020.  This will repeat the repeated in 1  year.  Claudication in peripheral vascular disease ([place]) History of PAD status post right common and external iliac artery stenosis which I intervened on 11/22/2014.  I performed angiography on her 10/03/2020 revealing a patent right iliac stent, total SFAs bilaterally with 90% calcified proximal left common iliac artery stenosis.  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  Because of pain in her left lower extremity I performed orbital atherectomy and covered stenting of her left common iliac artery 12/08/2020.  Her follow-up Doppler studies revealed a right ABI of 0.46 and a left of 0.58 with moderately elevated velocities in her left common and external iliac arteries.  Her claudication liver has significantly improved since intervention.      Lorretta Harp MD FACP,FACC,FAHA, [facility] 12/28/2020 3:58 PM

## 2020-12-28 NOTE — Patient Instructions (Signed)
Medication Instructions:  Your physician recommends that you continue on your current medications as directed. Please refer to the Current Medication list given to you today.  *If you need a refill on your cardiac medications before your next appointment, please call your pharmacy*   Testing/Procedures: Your physician has requested that you have a lower extremity arterial duplex. This test is an ultrasound of the arteries in the legs. It looks at arterial blood flow in the legs. Allow one hour for Lower Arterial scans. There are no restrictions or special instructions  Your physician has requested that you have an abdominal aorta duplex. During this test, an ultrasound is used to evaluate the aorta. Allow 30 minutes for this exam. Do not eat after midnight the day before and avoid carbonated beverages  Your physician has requested that you have an ankle brachial index (ABI). During this test an ultrasound and blood pressure cuff are used to evaluate the arteries that supply the arms and legs with blood. Allow thirty minutes for this exam. There are no restrictions or special instructions.  Your physician has requested that you have a carotid duplex. This test is an ultrasound of the carotid arteries in your neck. It looks at blood flow through these arteries that supply the brain with blood. Allow one hour for this exam. There are no restrictions or special instructions.  These procedures are done at [facility]. 2nd Floor. To be done Oct. 2022.   Follow-Up: At [facility], you and your health needs are our priority.  As part of our continuing mission to provide you with exceptional heart care, we have created designated Provider Care Teams.  These Care Teams include your primary Cardiologist (physician) and Advanced Practice Providers (APPs -  Physician Assistants and Nurse Practitioners) who all work together to provide you with the care you need, when you need it.  We recommend  signing up for the patient portal called "MyChart".  Sign up information is provided on this After Visit Summary.  MyChart is used to connect with patients for Virtual Visits (Telemedicine).  Patients are able to view lab/test results, encounter notes, upcoming appointments, etc.  Non-urgent messages can be sent to your provider as well.   To learn more about what you can do with MyChart, go to [email or web address].    Your next appointment:   6 month(s)  The format for your next appointment:   In Person  Provider:   Quay Burow, MD

## 2020-12-28 NOTE — Assessment & Plan Note (Signed)
History of PAD status post right common and external iliac artery stenosis which I intervened on 11/22/2014.  I performed angiography on her 10/03/2020 revealing a patent right iliac stent, total SFAs bilaterally with 90% calcified proximal left common iliac artery stenosis.  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  Because of pain in her left lower extremity I performed orbital atherectomy and covered stenting of her left common iliac artery 12/08/2020.  Her follow-up Doppler studies revealed a right ABI of 0.46 and a left of 0.58 with moderately elevated velocities in her left common and external iliac arteries.  Her claudication liver has significantly improved since intervention.

## 2021-01-06 ENCOUNTER — Ambulatory Visit: Payer: Medicare Other | Attending: Internal Medicine

## 2021-01-06 ENCOUNTER — Other Ambulatory Visit (HOSPITAL_BASED_OUTPATIENT_CLINIC_OR_DEPARTMENT_OTHER): Payer: Self-pay

## 2021-01-06 ENCOUNTER — Ambulatory Visit: Payer: Medicare Other | Admitting: Family Medicine

## 2021-01-06 DIAGNOSIS — Z23 Encounter for immunization: Secondary | ICD-10-CM

## 2021-01-06 MED ORDER — PFIZER-BIONT COVID-19 VAC-TRIS 30 MCG/0.3ML IM SUSP
INTRAMUSCULAR | 0 refills | Status: DC
  Filled 2021-01-06: qty 0.3, 1d supply, fill #0

## 2021-01-06 NOTE — Progress Notes (Signed)
   Covid-19 Vaccination Clinic  Name:  Christine Meyer    MRN: [id number] DOB: 1951/03/30  01/06/2021  Christine Meyer was observed post Covid-19 immunization for 15 minutes without incident. She was provided with Vaccine Information Sheet and instruction to access the V-Safe system.   Christine Meyer was instructed to call 911 with any severe reactions post vaccine: Marland Kitchen Difficulty breathing  . Swelling of face and throat  . A fast heartbeat  . A bad rash all over body  . Dizziness and weakness   Immunizations Administered    Name Date Dose VIS Date Route   PFIZER Comrnaty(Gray TOP) Covid-19 Vaccine 01/06/2021 10:48 AM 0.3 mL 08/18/2020 Intramuscular   Manufacturer: [employer], [employer]   Lot: [id number]   NDC: [phone]

## 2021-01-09 ENCOUNTER — Ambulatory Visit (INDEPENDENT_AMBULATORY_CARE_PROVIDER_SITE_OTHER): Payer: Medicare Other | Admitting: Family Medicine

## 2021-01-09 ENCOUNTER — Other Ambulatory Visit: Payer: Self-pay

## 2021-01-09 ENCOUNTER — Ambulatory Visit (HOSPITAL_BASED_OUTPATIENT_CLINIC_OR_DEPARTMENT_OTHER)
Admission: RE | Admit: 2021-01-09 | Discharge: 2021-01-09 | Disposition: A | Discharge status: Home / Self Care | Payer: Medicare Other | Source: Ambulatory Visit | Attending: Family Medicine | Admitting: Family Medicine

## 2021-01-09 ENCOUNTER — Encounter: Payer: Self-pay | Admitting: Family Medicine

## 2021-01-09 VITALS — BP 148/70 | HR 74 | Temp 97.9°F | Resp 18 | Ht 62.0 in | Wt 117.2 lb

## 2021-01-09 DIAGNOSIS — I10 Essential (primary) hypertension: Secondary | ICD-10-CM

## 2021-01-09 DIAGNOSIS — I739 Peripheral vascular disease, unspecified: Secondary | ICD-10-CM | POA: Diagnosis not present

## 2021-01-09 DIAGNOSIS — R059 Cough, unspecified: Secondary | ICD-10-CM

## 2021-01-09 DIAGNOSIS — E1165 Type 2 diabetes mellitus with hyperglycemia: Secondary | ICD-10-CM | POA: Diagnosis not present

## 2021-01-09 DIAGNOSIS — E785 Hyperlipidemia, unspecified: Secondary | ICD-10-CM

## 2021-01-09 DIAGNOSIS — E1151 Type 2 diabetes mellitus with diabetic peripheral angiopathy without gangrene: Secondary | ICD-10-CM | POA: Diagnosis not present

## 2021-01-09 DIAGNOSIS — E1159 Type 2 diabetes mellitus with other circulatory complications: Secondary | ICD-10-CM | POA: Diagnosis not present

## 2021-01-09 LAB — LIPID PANEL
Cholesterol: 156 mg/dL (ref 0–200)
HDL: 59.1 mg/dL (ref >39.00)
LDL Cholesterol: 83 mg/dL (ref 0–99)
NonHDL: 96.5
Total CHOL/HDL Ratio: 3
Triglycerides: 70 mg/dL (ref 0.0–149.0)
VLDL: 14 mg/dL (ref 0.0–40.0)

## 2021-01-09 LAB — COMPREHENSIVE METABOLIC PANEL
ALT: 17 U/L (ref 0–35)
AST: 19 U/L (ref 0–37)
Albumin: 4 g/dL (ref 3.5–5.2)
Alkaline Phosphatase: 65 U/L (ref 39–117)
BUN: 16 mg/dL (ref 6–23)
CO2: 28 mEq/L (ref 19–32)
Calcium: 9.5 mg/dL (ref 8.4–10.5)
Chloride: 102 mEq/L (ref 96–112)
Creatinine, Ser: 1.15 mg/dL (ref 0.40–1.20)
GFR: 47.83 mL/min — ABNORMAL LOW (ref >60.00)
Glucose, Bld: 66 mg/dL — ABNORMAL LOW (ref 70–99)
Potassium: 4.2 mEq/L (ref 3.5–5.1)
Sodium: 141 mEq/L (ref 135–145)
Total Bilirubin: 0.3 mg/dL (ref 0.2–1.2)
Total Protein: 6.7 g/dL (ref 6.0–8.3)

## 2021-01-09 LAB — CBC WITH DIFFERENTIAL/PLATELET
Basophils Absolute: 0 10*3/uL (ref 0.0–0.1)
Basophils Relative: 0.6 % (ref 0.0–3.0)
Eosinophils Absolute: 0.2 10*3/uL (ref 0.0–0.7)
Eosinophils Relative: 5.1 % — ABNORMAL HIGH (ref 0.0–5.0)
HCT: 33.6 % — ABNORMAL LOW (ref 36.0–46.0)
Hemoglobin: 11.2 g/dL — ABNORMAL LOW (ref 12.0–15.0)
Lymphocytes Relative: 33.5 % (ref 12.0–46.0)
Lymphs Abs: 1.6 10*3/uL (ref 0.7–4.0)
MCHC: 33.2 g/dL (ref 30.0–36.0)
MCV: 86.3 fl (ref 78.0–100.0)
Monocytes Absolute: 0.5 10*3/uL (ref 0.1–1.0)
Monocytes Relative: 10.5 % (ref 3.0–12.0)
Neutro Abs: 2.3 10*3/uL (ref 1.4–7.7)
Neutrophils Relative %: 50.3 % (ref 43.0–77.0)
Platelets: 261 10*3/uL (ref 150.0–400.0)
RBC: 3.9 Mil/uL (ref 3.87–5.11)
RDW: 14.5 % (ref 11.5–15.5)
WBC: 4.6 10*3/uL (ref 4.0–10.5)

## 2021-01-09 LAB — MICROALBUMIN / CREATININE URINE RATIO
Creatinine,U: 67.2 mg/dL
Microalb Creat Ratio: 1 mg/g (ref 0.0–30.0)
Microalb, Ur: <0.7 mg/dL (ref 0.0–1.9)

## 2021-01-09 LAB — HEMOGLOBIN A1C: Hgb A1c MFr Bld: 5.8 % (ref 4.6–6.5)

## 2021-01-09 MED ORDER — LORATADINE 10 MG PO TABS: 10.0000 mg | ORAL_TABLET | Freq: Every day | ORAL | 11 refills | Status: DC

## 2021-01-09 MED ORDER — BENZONATATE 100 MG PO CAPS: 100.0000 mg | ORAL_CAPSULE | Freq: Two times a day (BID) | ORAL | 0 refills | Status: DC | PRN

## 2021-01-09 NOTE — Assessment & Plan Note (Signed)
Well controlled, no changes to meds. Encouraged heart healthy diet such as the DASH diet and exercise as tolerated.  °

## 2021-01-09 NOTE — Assessment & Plan Note (Signed)
Encouraged heart healthy diet, increase exercise, avoid trans fats, consider a krill oil cap daily 

## 2021-01-09 NOTE — Progress Notes (Signed)
Patient ID: Christine Meyer, female    DOB: February 17, 1951  Age: 70 y.o. MRN: [id number]    Subjective:  Subjective  HPI Christine Meyer presents for an office visit today. She complains of coughing and sneezing x 2 weeks with yellow and white phlegm. She notes the cough is rougher at night and maybe the result from pollen. She also states that it resulted in sleep disturbance. She reports not taking any OTC medication. She also complains of bilateral hand arthralgias. She reports that it worsen with immobility and improved with movement. She denies any chest pain, SOB, fever, chills, sore throat, dysuria, urinary incontinence, back pain, HA, or N/V/D at this time. She endorses her stomach pain and improve since her last visit with Dr. Gwenlyn Found. She states that her appetite has improved as well. She notes that she has been reducing her smoking to twice per day and is planning to quite.    Review of Systems  Constitutional: Negative for chills, fatigue and fever.  HENT: Positive for sneezing. Negative for congestion, ear pain, sinus pressure, sinus pain and sore throat.   Eyes: Negative for pain and discharge.  Respiratory: Positive for cough. Negative for shortness of breath.   Cardiovascular: Negative for chest pain, palpitations and leg swelling.  Gastrointestinal: Negative for blood in stool, constipation, diarrhea, nausea and vomiting.  Genitourinary: Negative for dysuria, frequency, hematuria and urgency.  Musculoskeletal: Positive for arthralgias (Bilateral hands). Negative for back pain and myalgias.  Skin: Negative for rash.  Neurological: Negative for dizziness, weakness and headaches.  Psychiatric/Behavioral: Positive for sleep disturbance (Secondary to cough ).    History Past Medical History:  Diagnosis Date  . Anemia    NOS  . Anginal pain ([place])   . 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 CAD (coronary artery disease)    Stent RCA 1999  . Chronic kidney disease    . COPD (chronic obstructive pulmonary disease) ([place])   . Diabetes mellitus, type 2 ([facility])    Diet controlled  . Gastric polyps - hyperplastic 02/23/2018  . Hyperlipidemia   . Hypertension   . OSA (obstructive sleep apnea) [name]   . PVD (peripheral vascular disease) ([facility])    Carotid stenosis, renal artery stenosis  . Sleep apnea    dx'd but doesn't use mask   . Uterine cancer ([place]) ?[id number]    She has a past surgical history that includes Renal artery stent (Right, May 2007; 08/2006); Coronary angioplasty with stent (1999); Colonoscopy (2000); Iliac artery stent (Right, 11/22/2014); Total abdominal hysterectomy (?1980's); lower extremity angiogram (N/A, 11/22/2014); abdominal angiogram (11/22/2014); Esophagogastroduodenoscopy; ABDOMINAL AORTOGRAM W/LOWER EXTREMITY (N/A, 10/03/2020); VISCERAL ARTERY INTERVENTION (N/A, 10/03/2020); Lower Extremity Angiography (Bilateral, 12/08/2020); PERIPHERAL VASCULAR ATHERECTOMY (Left, 12/08/2020); and PERIPHERAL VASCULAR INTERVENTION (Left, 12/08/2020).   Her family history includes Breast cancer in her daughter; Deep vein thrombosis in her mother; Diabetes in her brother, maternal aunt, maternal grandmother, mother, and sister; Emphysema in her brother; Heart attack in her paternal aunt; Heart failure in her mother; Hypertension in her brother, father, and mother.She reports that she has been smoking cigarettes. She has a 12.75 pack-year smoking history. She has never used smokeless tobacco. She reports that she does not drink alcohol and does not use drugs.  Current Outpatient Medications on File Prior to Visit  Medication Sig Dispense Refill  . ACCU-CHEK SOFTCLIX LANCETS lancets Check blood sugar once daily. Dx:E11.9 100 each 5  . aspirin EC 81 MG tablet Take 1 tablet (81 mg total) by mouth  daily. 90 tablet 1  . atorvastatin (LIPITOR) 80 MG tablet Take 1 tablet (80 mg total) by mouth daily. 30 tablet 6  . Blood Glucose Monitoring Suppl (ONETOUCH VERIO)  w/Device KIT Use daily to check blood sugar.   DX E11.9 1 kit 0  . Calcium 500-100 MG-UNIT CHEW Chew 1 tablet by mouth daily before breakfast. 150 tablet 1  . Cholecalciferol (VITAMIN D3) 50 MCG (2000 UT) capsule Take 1 capsule (2,000 Units total) by mouth daily. 60 capsule 11  . clopidogrel (PLAVIX) 75 MG tablet TAKE 1 TABLET(75 MG) BY MOUTH DAILY (Patient taking differently: Take 75 mg by mouth daily.) 90 tablet 3  . COVID-19 mRNA Vac-TriS, Pfizer, (PFIZER-BIONT COVID-19 VAC-TRIS) SUSP injection Inject into the muscle. 0.3 mL 0  . cyanocobalamin (,VITAMIN B-12,) 1000 MCG/ML injection Inject 1 mL (1,000 mcg total) into the muscle every 30 (thirty) days. 30 mL 3  . ESBRIET 801 MG TABS TAKE 1 TABLET (801MG) BY  MOUTH THREE TIMES DAILY  WITH FOOD (Patient taking differently: Take 801 mg by mouth 2 (two) times daily.) 90 tablet 0  . glucose blood (ONETOUCH VERIO) test strip USE AS DIRECTED DAILY 100 strip 12  . Iron, Ferrous Sulfate, 325 (65 Fe) MG TABS Take 325 mg by mouth daily. 30 tablet 2  . metoprolol succinate (TOPROL-XL) 25 MG 24 hr tablet Take 1 tablet (25 mg total) by mouth daily. 90 tablet 1  . Multiple Vitamins-Minerals (A THRU Z SELECT 50+ ADVANCED) TABS TAKE 1 TABLET BY MOUTH DAILY (Patient taking differently: Take 1 tablet by mouth daily.) 220 tablet 1  . olmesartan (BENICAR) 20 MG tablet Take 1 tablet (20 mg total) by mouth daily. 90 tablet 1  . pantoprazole (PROTONIX) 40 MG tablet Take 1 tablet (40 mg total) by mouth daily before breakfast. 90 tablet 3  . spironolactone (ALDACTONE) 25 MG tablet Take 1 tablet (25 mg total) by mouth daily. 90 tablet 1  . SYRINGE-NEEDLE, DISP, 3 ML (BD INTEGRA SYRINGE) 25G X 1" 3 ML MISC USE TO INJECT B-12 ONCE A MONTH 12 each 1  . Tiotropium Bromide-Olodaterol (STIOLTO RESPIMAT) 2.5-2.5 MCG/ACT AERS Inhale 2 puffs into the lungs daily. 4 g 0   Current Facility-Administered Medications on File Prior to Visit  Medication Dose Route Frequency Provider Last  Rate Last Admin  . sodium chloride flush (NS) 0.9 % injection 3 mL  3 mL Intravenous Q12H Lorretta Harp, MD         Objective:  Objective  Physical Exam Vitals and nursing note reviewed.  Constitutional:      General: She is not in acute distress.    Appearance: Normal appearance. She is well-developed. She is not ill-appearing.  HENT:     Head: Normocephalic and atraumatic.     Right Ear: External ear normal.     Left Ear: External ear normal.     Nose: Nose normal.  Eyes:     General:        Right eye: No discharge.        Left eye: No discharge.     Extraocular Movements: Extraocular movements intact.     Conjunctiva/sclera: Conjunctivae normal.     Pupils: Pupils are equal, round, and reactive to light.  Cardiovascular:     Rate and Rhythm: Normal rate and regular rhythm.     Pulses: Normal pulses.     Heart sounds: Normal heart sounds. No murmur heard. No friction rub. No gallop.   Pulmonary:  Effort: Pulmonary effort is normal. No respiratory distress.     Breath sounds: Normal breath sounds. No stridor. No wheezing, rhonchi or rales.  Chest:     Chest wall: No tenderness.  Abdominal:     General: Bowel sounds are normal. There is no distension.     Palpations: Abdomen is soft. There is no mass.     Tenderness: There is no abdominal tenderness. There is no guarding or rebound.     Hernia: No hernia is present.  Musculoskeletal:        General: Normal range of motion.     Cervical back: Normal range of motion and neck supple.     Right lower leg: No edema.     Left lower leg: No edema.  Skin:    General: Skin is warm and dry.  Neurological:     Mental Status: She is alert and oriented to person, place, and time.  Psychiatric:        Behavior: Behavior normal.        Thought Content: Thought content normal.    BP (!) 148/70 (BP Location: Right Arm, Patient Position: Sitting, Cuff Size: Normal)   Pulse 74   Temp 97.9 F (36.6 C) (Oral)   Resp 18   Ht  5' 2"  (1.575 m)   Wt 117 lb 3.2 oz (53.2 kg)   SpO2 99%   BMI 21.44 kg/m  Wt Readings from Last 3 Encounters:  01/09/21 117 lb 3.2 oz (53.2 kg)  12/28/20 116 lb 9.6 oz (52.9 kg)  12/09/20 114 lb 1.6 oz (51.8 kg)     Lab Results  Component Value Date   WBC 5.4 12/09/2020   HGB 9.7 (L) 12/09/2020   HCT 29.6 (L) 12/09/2020   PLT 179 12/09/2020   GLUCOSE 102 (H) 12/09/2020   CHOL 141 07/07/2020   TRIG 103 07/07/2020   HDL 56 07/07/2020   LDLCALC 66 07/07/2020   ALT 14 09/23/2020   AST 19 09/23/2020   NA 138 12/09/2020   K 3.6 12/09/2020   CL 110 12/09/2020   CREATININE 0.99 12/09/2020   BUN 12 12/09/2020   CO2 22 12/09/2020   TSH 2.42 07/23/2019   INR 0.95 11/11/2014   HGBA1C 6.0 (H) 10/04/2020   MICROALBUR <0.7 07/23/2019    VAS [place] ABI WITH/WO TBI  Result Date: 12/22/2020 LOWER EXTREMITY DOPPLER STUDY Indications: Peripheral artery disease, and multiple iliac artery interventions.              Patient denies any claudication symptoms. She states she no longer              has pain in the left great toe at night since procedure, but there              is some numbness in that toe at night. She also c/o SOB upon              exertion. High Risk Factors: Hypertension, hyperlipidemia, Diabetes, current smoker,                    coronary artery disease. Other Factors: COPD.  Vascular Interventions: Successful ostial and distal right common iliac artery                         stenting with a Balloon expandable stent in the ostium  of the RCIA and nitinol self-expanding stent in the mid                         with excellent angiographic result on 11/22/2014.                         Angiogram also showed an occluded right renal artery                         stent and widely patent left renal artery and occluded                         left SFA with reconstitution in the adductor canal                         profunda femoris collaterals. Successful orbital                          atherectomy, VBX covered stenting of highly calcified                         ostial/proximal left common iliac artery with a totally                         occluded left SFA and three-vessel runoff on 12/08/2020.                         The right SFA was occluded at its origin reconstituting                         in the adductor canal with three-vessel runoff. Comparison Study: In 08/2020, a lower arterial Doppler showed an ABI of .40 on                   the right and .34 on the left. Performing Technologist: Sharlett Iles RVT  Examination Guidelines: A complete evaluation includes at minimum, Doppler waveform signals and systolic blood pressure reading at the level of bilateral brachial, anterior tibial, and posterior tibial arteries, when vessel segments are accessible. Bilateral testing is considered an integral part of a complete examination. Photoelectric Plethysmograph (PPG) waveforms and toe systolic pressure readings are included as required and additional duplex testing as needed. Limited examinations for reoccurring indications may be performed as noted.  ABI Findings: +---------+------------------+-----+----------+--------+ Right    Rt Pressure (mmHg)IndexWaveform  Comment  +---------+------------------+-----+----------+--------+ Brachial 166                                       +---------+------------------+-----+----------+--------+ ATA      76                0.46 monophasic         +---------+------------------+-----+----------+--------+ PTA      76                0.46 monophasic         +---------+------------------+-----+----------+--------+ PERO     74                0.45 monophasic         +---------+------------------+-----+----------+--------+ Lenon Ahmadi  0.53 Abnormal           +---------+------------------+-----+----------+--------+ +---------+------------------+-----+----------+-------+ Left     Lt Pressure  (mmHg)IndexWaveform  Comment +---------+------------------+-----+----------+-------+ Brachial 160                                      +---------+------------------+-----+----------+-------+ ATA      97                0.58 monophasic        +---------+------------------+-----+----------+-------+ PTA      91                0.55 monophasic        +---------+------------------+-----+----------+-------+ PERO     92                0.55 monophasic        +---------+------------------+-----+----------+-------+ Great Toe68                0.41 Abnormal          +---------+------------------+-----+----------+-------+ +-------+-----------+-----------+------------+------------+ ABI/TBIToday's ABIToday's TBIPrevious ABIPrevious TBI +-------+-----------+-----------+------------+------------+ Right  .46        .53        .40         .17          +-------+-----------+-----------+------------+------------+ Left   .58        .41        .34         0.0          +-------+-----------+-----------+------------+------------+  Right ABIs appear essentially unchanged compared to prior study on 08/16/2020. Left ABIs and bilateral TBIs appear increased compared to prior study on 08/16/2020.  Summary: Right: Resting right ankle-brachial index indicates severe right lower extremity arterial disease. The right toe-brachial index is abnormal. TBIs increased by .36. Left: Resting left ankle-brachial index indicates moderate left lower extremity arterial disease. The left toe-brachial index is abnormal. ABIs increased by .24 and TBIs increased by .41.  *See table(s) above for measurements and observations. See Aortoiliac and LE Arterial duplex reports. Suggest follow up study in 6 months. Electronically signed by Carlyle Dolly MD on 12/22/2020 at 1:26:12 PM.    Final    VAS [place] LOWER EXTREMITY ARTERIAL DUPLEX  Result Date: 12/22/2020 LOWER EXTREMITY ARTERIAL DUPLEX STUDY Indications: Peripheral  artery disease, and multiple iliac artery interventions.              Patient denies any claudication symptoms. She states she no longer              has pain in the left great toe at night since procedure, but there              is some numbness in that toe at night. She also c/o SOB upon              exertion. High Risk Factors: Hypertension, hyperlipidemia, Diabetes, current smoker,                    coronary artery disease. Other Factors: COPD.  Vascular Interventions: Successful ostial and distal right common iliac artery                         stenting with a Balloon expandable stent in the ostium  of the RCIA and nitinol self-expanding stent in the mid                         with excellent angiographic result on 11/22/2014.                         Angiogram also showed an occluded right renal artery                         stent and widely patent left renal artery and occluded                         left SFA with reconstitution in the adductor canal                         profunda femoris collaterals. Successful orbital                         atherectomy, VBX covered stenting of highly calcified                         ostial/proximal left common iliac artery with a totally                         occluded left SFA and three-vessel runoff on 12/08/2020.                         The right SFA was occluded at its origin reconstituting                         in the adductor canal with three-vessel runoff. Current ABI:            .46 on the right and .58 on the left Comparison Study: In 08/2014, a lower arterial duplex showed velocities of 205                   cm/s in the left CFA suggesting >50% stenosis and long segment                   chronic occlusion of the left proximal to distal SFA with                   reconstitution via collateral flow in the proximal popliteal                   artery. Performing Technologist: Sharlett Iles RVT  Examination Guidelines: A complete  evaluation includes B-mode imaging, spectral Doppler, color Doppler, and power Doppler as needed of all accessible portions of each vessel. Bilateral testing is considered an integral part of a complete examination. Limited examinations for reoccurring indications may be performed as noted.   +----------+--------+-----+---------------+----------+---------+ LEFT      PSV cm/sRatioStenosis       Waveform  Comments  +----------+--------+-----+---------------+----------+---------+ CFA Prox  191                         monophasicturbulent +----------+--------+-----+---------------+----------+---------+ CFA Mid   277          50-74% stenosismonophasicturbulent +----------+--------+-----+---------------+----------+---------+ CFA Distal219          50-74% stenosismonophasicturbulent +----------+--------+-----+---------------+----------+---------+ DFA  165                         monophasicturbulent +----------+--------+-----+---------------+----------+---------+ SFA Prox  0            occluded                           +----------+--------+-----+---------------+----------+---------+ SFA Mid   0            occluded                           +----------+--------+-----+---------------+----------+---------+ SFA Distal0            occluded                           +----------+--------+-----+---------------+----------+---------+ POP Prox  47                          monophasic          +----------+--------+-----+---------------+----------+---------+ POP Mid   56                          monophasic          +----------+--------+-----+---------------+----------+---------+ POP Distal38                          monophasic          +----------+--------+-----+---------------+----------+---------+ TP Trunk  47                          monophasic          +----------+--------+-----+---------------+----------+---------+  Summary: Left: No significant  change as compared to previous study. Diffuse heterogenous plaque throughout. Essentially stable 50-74% stenosis in the mid and distal CFA. Totally occluded proximal to distal SFA with reconstitution via collateral flow to the above-the-knee popliteal artery.  See table(s) above for measurements and observations. See Aortoiliac duplex and ABI reports. Electronically signed by Carlyle Dolly MD on 12/22/2020 at 1:36:42 PM.    Final    VAS [place] AORTA/IVC/ILIACS  Result Date: 12/22/2020 ABDOMINAL AORTA STUDY Indications: Peripheral arterial disease, and multiple iliac artery              interventions. Patient denies any claudication symptoms. She states              she no longer has pain in the left great toe at night since              procedure, but there is some numbness in that toe at night. She              also c/o SOB upon exertion.               Today's ABIs are .46 on the right and .58 on the left. Risk Factors: Hypertension, hyperlipidemia, Diabetes, current smoker, coronary               artery disease. Other Factors: COPD. Vascular Interventions: Successful ostial and distal right common iliac artery                         stenting with a Balloon expandable stent in the ostium  of the RCIA and nitinol self-expanding stent in the mid                         with excellent angiographic result on 11/22/2014.                         Angiogram also showed an occluded right renal artery                         stent and widely patent left renal artery and occluded                         left SFA with reconstitution in the adductor canal                         profunda femoris collaterals. Successful orbital                         atherectomy, VBX covered stenting of highly calcified                         ostial/proximal left common iliac artery with a totally                         occluded left SFA and three-vessel runoff on 12/08/2020.                         The right SFA was  occluded at its origin reconstituting                         in the adductor canal with three-vessel runoff. Limitations: Air/bowel gas.  Comparison Study: In 08/2020, an aortoiliac duplex showed a patent right common                   iliac artery stent without evidence of stenosis, elevated                   velocities throughout the right external iliac artery stent                   suggesting 50-99% in-stent restenosis at the pre and proximal                   stent segments. Elevated velocities throughout the right                   common iliac artery with high velocities at 291 cm/s                   suggesting >50% stenosis and elevated velocities in the left                   proximal external iliac artery suggesting >50% stenosis, low                   end range. Performing Technologist: Sharlett Iles RVT  Examination Guidelines: A complete evaluation includes B-mode imaging, spectral Doppler, color Doppler, and power Doppler as needed of all accessible portions of each vessel. Bilateral testing is considered an integral part of a complete examination. Limited examinations for reoccurring indications may be performed as noted.  Abdominal Aorta  Findings: +-------------+-------+----------+----------+----------+--------+---------+ Location     AP (cm)Trans (cm)PSV (cm/s)Waveform  ThrombusComments  +-------------+-------+----------+----------+----------+--------+---------+ Proximal     1.80   1.80      77                                    +-------------+-------+----------+----------+----------+--------+---------+ Mid                           76                                    +-------------+-------+----------+----------+----------+--------+---------+ Distal                        73                                    +-------------+-------+----------+----------+----------+--------+---------+ RT CIA Prox                   68        monophasic                   +-------------+-------+----------+----------+----------+--------+---------+ RT CIA Mid                    103       biphasic                    +-------------+-------+----------+----------+----------+--------+---------+ RT EIA Distal                 184       biphasic                    +-------------+-------+----------+----------+----------+--------+---------+ LT CIA Prox                   332       monophasic        turbulent +-------------+-------+----------+----------+----------+--------+---------+ LT CIA Mid                    267       monophasic        turbulent +-------------+-------+----------+----------+----------+--------+---------+ LT CIA Distal                 309       monophasic        turbulent +-------------+-------+----------+----------+----------+--------+---------+ LT EIA Prox                   245       monophasic        turbulent +-------------+-------+----------+----------+----------+--------+---------+ LT EIA Mid                    271       monophasic        turbulent +-------------+-------+----------+----------+----------+--------+---------+ LT EIA Distal                 295       monophasic        turbulent +-------------+-------+----------+----------+----------+--------+---------+ IVC/Iliac Findings: +--------+------+--------+--------+   IVC   PatentThrombusComments +--------+------+--------+--------+ IVC Proxpatent                 +--------+------+--------+--------+   Right Stent(s): +-------------------+--------+--------+--------+--------+ Common Iliac ArteryPSV  cm/sStenosisWaveformComments +-------------------+--------+--------+--------+--------+ Prox to Stent      117             biphasic         +-------------------+--------+--------+--------+--------+ Proximal Stent     126             biphasic         +-------------------+--------+--------+--------+--------+ Mid Stent          102              biphasic         +-------------------+--------+--------+--------+--------+ Distal Stent       119             biphasic         +-------------------+--------+--------+--------+--------+ Distal to Stent    125             biphasic         +-------------------+--------+--------+--------+--------+ Patent right common iliac artery stent without evidence of stenosis. +---------------------+--------+---------------+----------+--------+ External Iliac ArteryPSV cm/sStenosis       Waveform  Comments +---------------------+--------+---------------+----------+--------+ Prox to Stent                                         NV       +---------------------+--------+---------------+----------+--------+ Proximal Stent                                        NV       +---------------------+--------+---------------+----------+--------+ Mid Stent            251     50-99% stenosismonophasic         +---------------------+--------+---------------+----------+--------+ Distal Stent         200                    monophasic         +---------------------+--------+---------------+----------+--------+ Distal to Stent      182                    monophasic         +---------------------+--------+---------------+----------+--------+ The pre and proximal stent segment of the right external iliac artery is not visualized due to overlying bowel gas. There are elevated velocities at the mid and distal stent segment that are essentially stable compared to the prior exam.  Summary: Abdominal Aorta: Aortoiliac atherosclerosis. The largest aortic measurement is 1.8 cm. Stenosis: +--------------------+-------------+---------------+---------------------------+ Location            Stenosis     Stent          Comments                    +--------------------+-------------+---------------+---------------------------+ Right Common Iliac               no stenosis                                 +--------------------+-------------+---------------+---------------------------+ Left Common Iliac                50-99% stenosisstent struts not  visualized, elevated                                                        velocities throughout; mild                                                 to moderate velocity                                                        increase compared to prior                                                  exam                        +--------------------+-------------+---------------+---------------------------+ Right External Iliac             50-99% stenosisessentially stable where                                                    visualized                  +--------------------+-------------+---------------+---------------------------+ Left External Iliac >50% stenosis               increase velocities                                                         throughout when compared to                                                 prior exam                  +--------------------+-------------+---------------+---------------------------+  IVC/Iliac: Patent IVC.  *See table(s) above for measurements and observations. See LE Arterial duplex and ABI report. Suggest follow up study in 6 months.  Electronically signed by Carlyle Dolly MD on 12/22/2020 at 1:38:04 PM.    Final      Assessment & Plan:  Plan    Meds ordered this encounter  Medications  . loratadine (CLARITIN) 10 MG tablet    Sig: Take 1 tablet (10 mg total) by mouth daily.    Dispense:  30 tablet    Refill:  11  . benzonatate (TESSALON) 100 MG capsule    Sig: Take 1 capsule (100 mg total) by mouth 2 (two) times daily as  needed for cough.    Dispense:  20 capsule    Refill:  0    Problem List Items Addressed This Visit      Unprioritized   Claudication in peripheral vascular  disease ([place])    Per cardiology      DM (diabetes mellitus) type II, controlled, with peripheral vascular disorder ([place])    hgba1c to be checked minimize simple carbs. Increase exercise as tolerated. Continue current meds      Essential hypertension    Well controlled, no changes to meds. Encouraged heart healthy diet such as the DASH diet and exercise as tolerated.       Hyperlipidemia LDL goal <70    Encouraged heart healthy diet, increase exercise, avoid trans fats, consider a krill oil cap daily      Uncontrolled type 2 diabetes mellitus with hyperglycemia (HCC)    Check labs         Other Visit Diagnoses    Cough in adult    -  Primary   Relevant Medications   loratadine (CLARITIN) 10 MG tablet   benzonatate (TESSALON) 100 MG capsule   Other Relevant Orders   DG Chest 2 View   CBC with Differential/Platelet   Comprehensive metabolic panel   Novel Coronavirus, NAA ([facility])   Controlled type 2 diabetes mellitus with other circulatory complication, without long-term current use of insulin (HCC)       Relevant Orders   CBC with Differential/Platelet   Lipid panel   Comprehensive metabolic panel   Hemoglobin A1c   Microalbumin / creatinine urine ratio   Primary hypertension       Relevant Orders   CBC with Differential/Platelet   Lipid panel   Comprehensive metabolic panel   Hemoglobin A1c   Microalbumin / creatinine urine ratio      Follow-up: Return in about 6 months (around 07/12/2021), or if symptoms worsen or fail to improve, for annual exam, fasting.   I,Gordon Zheng,acting as a Education administrator for [employer], DO.,have documented all relevant documentation on the behalf of Ann Held, DO,as directed by  Ann Held, DO while in the presence of [place], DO, have reviewed all documentation for this visit. The documentation on 01/09/21 for the exam, diagnosis, procedures, and orders are all  accurate and complete.  Ann Held, DO

## 2021-01-09 NOTE — Assessment & Plan Note (Signed)
Check labs 

## 2021-01-09 NOTE — Assessment & Plan Note (Signed)
hgba1c to be checked minimize simple carbs. Increase exercise as tolerated. Continue current meds 

## 2021-01-09 NOTE — Patient Instructions (Signed)

## 2021-01-09 NOTE — Assessment & Plan Note (Signed)
Per cardiology 

## 2021-01-10 LAB — NOVEL CORONAVIRUS, NAA: SARS-CoV-2, NAA: NOT DETECTED

## 2021-01-17 ENCOUNTER — Ambulatory Visit: Payer: Medicare Other | Admitting: Cardiovascular Disease

## 2021-01-26 ENCOUNTER — Ambulatory Visit (HOSPITAL_BASED_OUTPATIENT_CLINIC_OR_DEPARTMENT_OTHER): Payer: Medicare Other

## 2021-01-31 DIAGNOSIS — L84 Corns and callosities: Secondary | ICD-10-CM | POA: Diagnosis not present

## 2021-01-31 DIAGNOSIS — E1159 Type 2 diabetes mellitus with other circulatory complications: Secondary | ICD-10-CM | POA: Diagnosis not present

## 2021-01-31 DIAGNOSIS — B351 Tinea unguium: Secondary | ICD-10-CM | POA: Diagnosis not present

## 2021-01-31 DIAGNOSIS — M79672 Pain in left foot: Secondary | ICD-10-CM | POA: Diagnosis not present

## 2021-01-31 DIAGNOSIS — M79671 Pain in right foot: Secondary | ICD-10-CM | POA: Diagnosis not present

## 2021-02-09 ENCOUNTER — Other Ambulatory Visit: Payer: Self-pay | Admitting: Family Medicine

## 2021-02-09 DIAGNOSIS — E785 Hyperlipidemia, unspecified: Secondary | ICD-10-CM

## 2021-02-13 ENCOUNTER — Other Ambulatory Visit: Payer: Self-pay

## 2021-02-13 ENCOUNTER — Ambulatory Visit (HOSPITAL_BASED_OUTPATIENT_CLINIC_OR_DEPARTMENT_OTHER)
Admission: RE | Admit: 2021-02-13 | Discharge: 2021-02-13 | Disposition: A | Discharge status: Home / Self Care | Payer: Medicare Other | Source: Ambulatory Visit | Attending: Family Medicine | Admitting: Family Medicine

## 2021-02-13 ENCOUNTER — Encounter (HOSPITAL_BASED_OUTPATIENT_CLINIC_OR_DEPARTMENT_OTHER): Payer: Self-pay

## 2021-02-13 DIAGNOSIS — Z1231 Encounter for screening mammogram for malignant neoplasm of breast: Secondary | ICD-10-CM | POA: Insufficient documentation

## 2021-02-20 ENCOUNTER — Other Ambulatory Visit: Payer: Self-pay | Admitting: Family Medicine

## 2021-02-27 ENCOUNTER — Other Ambulatory Visit: Payer: Self-pay

## 2021-02-27 DIAGNOSIS — I10 Essential (primary) hypertension: Secondary | ICD-10-CM

## 2021-02-27 MED ORDER — METOPROLOL SUCCINATE ER 25 MG PO TB24: 25.0000 mg | ORAL_TABLET | Freq: Every day | ORAL | 1 refills | Status: DC

## 2021-03-20 ENCOUNTER — Telehealth: Payer: Self-pay | Admitting: Pulmonary Disease

## 2021-03-20 MED ORDER — STIOLTO RESPIMAT 2.5-2.5 MCG/ACT IN AERS: 2.0000 | INHALATION_SPRAY | Freq: Every day | RESPIRATORY_TRACT | 1 refills | Status: DC

## 2021-03-20 NOTE — Telephone Encounter (Signed)
LM informing patient, refill has been sent in to pharmacy of choice.   Nothing further needed at this time.

## 2021-03-20 NOTE — Telephone Encounter (Signed)
Pt stated that she is in need of a refill for her Stiolto Respimat to be sent to the pharmacy [facility] so that she can start mail order.   Pls regard; [phone]

## 2021-03-21 ENCOUNTER — Telehealth: Payer: Self-pay | Admitting: Pulmonary Disease

## 2021-03-21 MED ORDER — PIRFENIDONE 801 MG PO TABS: 801.0000 mg | ORAL_TABLET | Freq: Two times a day (BID) | ORAL | 0 refills | Status: DC

## 2021-03-21 NOTE — Telephone Encounter (Signed)
Called and spoke with Patient.  Patient requested a refill for Esbriet to be sent to [facility]. Refill sent to [facility].  Nothing further at this time.

## 2021-03-23 MED ORDER — PIRFENIDONE 801 MG PO TABS: 801.0000 mg | ORAL_TABLET | Freq: Two times a day (BID) | ORAL | 1 refills | Status: DC

## 2021-03-23 NOTE — Addendum Note (Signed)
Addended by: Lorretta Harp on: 03/23/2021 02:55 PM   Modules accepted: Orders

## 2021-03-31 ENCOUNTER — Other Ambulatory Visit: Payer: Self-pay

## 2021-03-31 DIAGNOSIS — E785 Hyperlipidemia, unspecified: Secondary | ICD-10-CM

## 2021-03-31 MED ORDER — ATORVASTATIN CALCIUM 80 MG PO TABS
80.0000 mg | ORAL_TABLET | Freq: Every day | ORAL | 1 refills | Status: DC
Start: 2021-03-31 — End: 2021-08-28

## 2021-04-01 ENCOUNTER — Other Ambulatory Visit: Payer: Self-pay | Admitting: Family Medicine

## 2021-04-02 ENCOUNTER — Other Ambulatory Visit: Payer: Self-pay | Admitting: Pulmonary Disease

## 2021-04-05 ENCOUNTER — Other Ambulatory Visit: Payer: Self-pay

## 2021-04-05 DIAGNOSIS — I10 Essential (primary) hypertension: Secondary | ICD-10-CM

## 2021-04-05 MED ORDER — OLMESARTAN MEDOXOMIL 20 MG PO TABS: 20.0000 mg | ORAL_TABLET | Freq: Every day | ORAL | 1 refills | Status: DC

## 2021-04-05 MED ORDER — SPIRONOLACTONE 25 MG PO TABS: 25.0000 mg | ORAL_TABLET | Freq: Every day | ORAL | 1 refills | Status: DC

## 2021-04-05 MED ORDER — CLOPIDOGREL BISULFATE 75 MG PO TABS
75.0000 mg | ORAL_TABLET | Freq: Every day | ORAL | 1 refills | Status: DC
Start: 2021-04-05 — End: 2021-10-23

## 2021-04-18 ENCOUNTER — Encounter (HOSPITAL_COMMUNITY): Payer: Medicare Other

## 2021-05-01 ENCOUNTER — Telehealth: Payer: Self-pay | Admitting: Pulmonary Disease

## 2021-05-01 DIAGNOSIS — Z5181 Encounter for therapeutic drug level monitoring: Secondary | ICD-10-CM

## 2021-05-03 DIAGNOSIS — E1159 Type 2 diabetes mellitus with other circulatory complications: Secondary | ICD-10-CM | POA: Diagnosis not present

## 2021-05-03 DIAGNOSIS — M79671 Pain in right foot: Secondary | ICD-10-CM | POA: Diagnosis not present

## 2021-05-03 DIAGNOSIS — M79672 Pain in left foot: Secondary | ICD-10-CM | POA: Diagnosis not present

## 2021-05-03 DIAGNOSIS — L84 Corns and callosities: Secondary | ICD-10-CM | POA: Diagnosis not present

## 2021-05-03 DIAGNOSIS — B351 Tinea unguium: Secondary | ICD-10-CM | POA: Diagnosis not present

## 2021-05-04 NOTE — Telephone Encounter (Signed)
Called and spoke with patient to get her scheduled for follow up with Dr. Vaughan Browner. Patient is now scheduled with Dr. Vaughan Browner Next Appt With Pulmonology [facility], MD)06/08/2021 at 10:30 AM. Also advised her that labs have been ordered and that she can come in at any time next week to get those done. Patient expressed understanding. Nothing further needed at this time.

## 2021-05-04 NOTE — Telephone Encounter (Signed)
Routing to [place] for f/u.

## 2021-05-04 NOTE — Telephone Encounter (Signed)
Patient is overdue for LFTs and appt with Dr. Vaughan Browner. Future order for CMP placed today and patient has been advised to ask daughter if she can bring patient to clinic for labs next week. She has been provided with lab hours  Last seen for OV on 09/23/20 with expected f/u in 3 months  Rx for Esbriet '801mg'$  twice daily sent on 03/23/21 for 90 day supply x 1 refill and patient should not need more refills  Routing to triage to schedule patient for f/u  Knox Saliva, PharmD, MPH, BCPS Clinical Pharmacist (Rheumatology and Pulmonology)

## 2021-05-05 NOTE — Telephone Encounter (Signed)
This patient has not filled Esbriet since 07/22/2019 per dispense report and per [facility] record. Rx that was sent in July 2022 was not transmitted to pharmacy. Pharmacy team will ensure Esbriet authorization remains in place though patient will likely have to re-titrate from starter dose.  F/u with Dr. Vaughan Browner on 05/08/21  Knox Saliva, PharmD, MPH, BCPS Clinical Pharmacist (Rheumatology and Pulmonology)

## 2021-05-05 NOTE — Telephone Encounter (Signed)
Received notification from [facility] regarding a prior authorization for ESBRIET. Authorization has been APPROVED from 05/05/2021 to 09/09/2021.    Patient must fill through [place]: [phone]   Authorization # [id number] Phone # [phone]

## 2021-05-05 NOTE — Telephone Encounter (Signed)
Submitted a Prior Authorization request to [facility] for ESBRIET via CoverMyMeds. Will update once we receive a response.   Key: [id number]

## 2021-05-11 ENCOUNTER — Telehealth: Payer: Self-pay | Admitting: Pulmonary Disease

## 2021-05-11 NOTE — Telephone Encounter (Signed)
Pt sent a MyChart message stating;  "Also she was told to have labs done prior to appt checking to see if she can go to the [facility] to do the labs"  Pls regard; [phone]

## 2021-05-12 NOTE — Telephone Encounter (Signed)
Spoke with the pt  She was asking about getting labs in [place]  I advisded if it's a [facility] office that is fine  Otherwise she will come here

## 2021-05-16 ENCOUNTER — Other Ambulatory Visit: Payer: Self-pay | Admitting: Pulmonary Disease

## 2021-05-16 DIAGNOSIS — Z5181 Encounter for therapeutic drug level monitoring: Secondary | ICD-10-CM | POA: Diagnosis not present

## 2021-05-17 LAB — COMPREHENSIVE METABOLIC PANEL
ALT: 23 IU/L (ref 0–32)
AST: 25 IU/L (ref 0–40)
Albumin/Globulin Ratio: 1.7 (ref 1.2–2.2)
Albumin: 4.2 g/dL (ref 3.7–4.7)
Alkaline Phosphatase: 72 IU/L (ref 44–121)
BUN/Creatinine Ratio: 15 (ref 12–28)
BUN: 19 mg/dL (ref 8–27)
Bilirubin Total: <0.2 mg/dL (ref 0.0–1.2)
CO2: 23 mmol/L (ref 20–29)
Calcium: 9.8 mg/dL (ref 8.7–10.3)
Chloride: 104 mmol/L (ref 96–106)
Creatinine, Ser: 1.23 mg/dL — ABNORMAL HIGH (ref 0.57–1.00)
Globulin, Total: 2.5 g/dL (ref 1.5–4.5)
Glucose: 92 mg/dL (ref 65–99)
Potassium: 4.9 mmol/L (ref 3.5–5.2)
Sodium: 140 mmol/L (ref 134–144)
Total Protein: 6.7 g/dL (ref 6.0–8.5)
eGFR: 47 mL/min/{1.73_m2} — ABNORMAL LOW (ref >59)

## 2021-06-08 ENCOUNTER — Telehealth: Payer: Self-pay | Admitting: Pulmonary Disease

## 2021-06-08 ENCOUNTER — Encounter: Payer: Self-pay | Admitting: Pulmonary Disease

## 2021-06-08 ENCOUNTER — Other Ambulatory Visit: Payer: Self-pay

## 2021-06-08 ENCOUNTER — Ambulatory Visit (INDEPENDENT_AMBULATORY_CARE_PROVIDER_SITE_OTHER): Payer: Medicare Other | Admitting: Pulmonary Disease

## 2021-06-08 VITALS — BP 138/66 | HR 73 | Temp 98.0°F | Ht 61.0 in | Wt 121.0 lb

## 2021-06-08 DIAGNOSIS — J84112 Idiopathic pulmonary fibrosis: Secondary | ICD-10-CM | POA: Diagnosis not present

## 2021-06-08 DIAGNOSIS — F17210 Nicotine dependence, cigarettes, uncomplicated: Secondary | ICD-10-CM

## 2021-06-08 DIAGNOSIS — Z5181 Encounter for therapeutic drug level monitoring: Secondary | ICD-10-CM

## 2021-06-08 MED ORDER — PIRFENIDONE 801 MG PO TABS: 801.0000 mg | ORAL_TABLET | Freq: Two times a day (BID) | ORAL | 1 refills | Status: DC

## 2021-06-08 MED ORDER — GUAIFENESIN ER 600 MG PO TB12: 600.0000 mg | ORAL_TABLET | Freq: Two times a day (BID) | ORAL | 5 refills | Status: AC

## 2021-06-08 NOTE — Telephone Encounter (Signed)
Called and spoke with [facility] who is calling because they need an RX for patient's Esbriet. RX has been sent. Nothing further needed at this time.

## 2021-06-08 NOTE — Progress Notes (Signed)
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  She has significant smoking history.  Evaluated by rheumatology in 2016 by Christine Meyer for mildly elevated rheumatoid factor with no evidence of connective tissue disease She is started on Esbriet in 2016 She was initially taking it twice a day due to fear of side effects.  Dose increased to full dose, 3 times a day in 2021 and she has tolerated it well.  Pets: Dogs, no cats, birds Occupation: Retired Training and development officer Exposures: No known exposures.  No mold, hot tub, Jacuzzi, no feather pillows or comforter Smoking history: 13-pack-year smoker.  Continues to smoke every day Travel history: No significant travel history Relevant family history: No significant family history of lung disease  Interim history: She had a break in therapy in [place] for the past 2 months due to insurance issues.  Her medication has been approved again and she needs to call the pharmacy to get this sent to her  Continues to have chest congestion, chronic dyspnea on exertion Continues on stiolto.  Continues to smoke   Outpatient Encounter Medications as of 06/08/2021  Medication Sig   ACCU-CHEK SOFTCLIX LANCETS lancets Check blood sugar once daily. Dx:E11.9   aspirin EC 81 MG tablet Take 1 tablet (81 mg total) by mouth daily.   atorvastatin (LIPITOR) 80 MG tablet Take 1 tablet (80 mg total) by mouth daily.   Blood Glucose Monitoring Suppl (ONETOUCH VERIO) w/Device KIT Use daily to check blood sugar.   DX E11.9   Calcium 500-100 MG-UNIT CHEW Chew 1 tablet by mouth daily before breakfast.   Cholecalciferol (VITAMIN D3) 50 MCG  (2000 UT) capsule Take 1 capsule (2,000 Units total) by mouth daily.   clopidogrel (PLAVIX) 75 MG tablet Take 1 tablet (75 mg total) by mouth daily.   cyanocobalamin (,VITAMIN B-12,) 1000 MCG/ML injection INJECT INTO THE MUSCLE EVERY 30 DAYS   FEROSUL 325 (65 Fe) MG tablet TAKE 1 TABLET BY MOUTH DAILY   glucose blood (ONETOUCH VERIO) test strip USE AS DIRECTED DAILY   loratadine (CLARITIN) 10 MG tablet Take 1 tablet (10 mg total) by mouth daily.   metoprolol succinate (TOPROL-XL) 25 MG 24 hr tablet Take 1 tablet (25 mg total) by mouth daily.   Multiple Vitamins-Minerals (A THRU Z SELECT 50+ ADVANCED) TABS TAKE 1 TABLET BY MOUTH DAILY (Patient taking differently: Take 1 tablet by mouth daily.)   olmesartan (BENICAR) 20 MG tablet Take 1 tablet (20 mg total) by mouth daily.   pantoprazole (PROTONIX) 40 MG tablet Take 1 tablet (40 mg total) by mouth daily before breakfast.   Pirfenidone (ESBRIET) 801 MG TABS Take 801 mg by mouth 2 (two) times daily.   spironolactone (ALDACTONE) 25 MG tablet Take 1 tablet (25 mg total) by mouth daily.   STIOLTO RESPIMAT 2.5-2.5 MCG/ACT AERS INHALE 2 PUFFS INTO THE LUNGS DAILY   SYRINGE-NEEDLE, DISP, 3 ML (BD INTEGRA SYRINGE) 25G X 1" 3 ML MISC USE TO INJECT B-12 ONCE A MONTH   benzonatate (TESSALON) 100 MG capsule Take 1 capsule (100 mg total) by mouth 2 (two) times daily as needed for cough.  COVID-19 mRNA Vac-TriS, Pfizer, (PFIZER-BIONT COVID-19 VAC-TRIS) SUSP injection Inject into the muscle.   Facility-Administered Encounter Medications as of 06/08/2021  Medication   sodium chloride flush (NS) 0.9 % injection 3 mL    Allergies as of 06/08/2021   (No Known Allergies)    Physical Exam: Blood pressure 138/66, pulse 73, temperature 98 F (36.7 C), temperature source Oral, height 5' 1"  (1.549 m), weight 121 lb (54.9 kg), SpO2 99 %. Gen:      No acute distress HEENT:  EOMI, sclera anicteric Neck:     No masses; no thyromegaly Lungs:    Clear to auscultation  bilaterally; normal respiratory effort CV:         Regular rate and rhythm; no murmurs Abd:      + bowel sounds; soft, non-tender; no palpable masses, no distension Ext:    No edema; adequate peripheral perfusion Skin:      Warm and dry; no rash Neuro: alert and oriented x 3 Psych: normal mood and affect   Data Reviewed: Imaging: CT chest 05/09/2018-mild emphysema, slight progression of interstitial lung disease with UIP pattern CT high-resolution 02/16/2020-mild emphysema, stable UIP pulmonary fibrosis. CT high-resolution 08/15/2020- stable pattern of pulmonary fibrosis, emphysema I have reviewed the images personally.  PFTs: 08/19/19 FVC 2.28 [101%], FEV1 1.78 [101%], F/F 78, TLC 3.86 [78%], DLCO 9.31 [49%]  09/08/2020 FVC 1.65 [74%], FEV1 1.05 [61%], F/F 64, TLC 3.64 [74%], DLCO 6.32 (33%) Moderate restriction with severe diffusion defect  Labs: 10/19/2014 ANA, Aldolase, CCP, SCL 70, SSA/SSB, Jo-1 all negative; however ESR 40, RF 17 (both elevated)  Hepatic panel 05/16/2021-LFTs are stable.  Creatinine increased to 1.23   Assessment:  IPF She had a break in therapy and will need to call the specialty pharmacy to get the medication delivered again Instructions given to her today on the number to call for [facility]  LFTs last week reviewed with slight increase in creatinine.  Recheck labs in 1 month   COPD Stable on stiolto Smoking cessation encouraged but she is not interested in quitting.  Time spent counseling-5 minutes Reassess at return visit  Health maintenance Up-to-date with COVID-19 vaccine.  She plans on getting the booster soon Up-to-date with flu vaccine  Plan/Recommendations: - Restart Esbriet - Recheck CMP in 1 month   Christine Garfinkel MD [facility] 06/08/2021, 10:24 AM  CC: Christine Meyer, *

## 2021-06-08 NOTE — Patient Instructions (Addendum)
Your Christine Meyer has been reapproved to the pharmacy.  Will give the number to call to make sure that the medication I sent you  We will reorder a CMP in 1 month as we need to monitor your kidney function  Continue the inhaler Continue to work on smoking cessation  Follow-up in 6 months.

## 2021-06-09 ENCOUNTER — Encounter: Payer: Self-pay | Admitting: Pulmonary Disease

## 2021-06-13 ENCOUNTER — Other Ambulatory Visit: Payer: Self-pay | Admitting: Pharmacist

## 2021-06-13 DIAGNOSIS — J84112 Idiopathic pulmonary fibrosis: Secondary | ICD-10-CM

## 2021-06-13 MED ORDER — PIRFENIDONE 267 MG PO TABS
801.0000 mg | ORAL_TABLET | Freq: Two times a day (BID) | ORAL | 2 refills | Status: DC
Start: 2021-06-13 — End: 2021-11-07

## 2021-06-13 MED ORDER — PIRFENIDONE 267 MG PO TABS: ORAL_TABLET | ORAL | 0 refills | Status: DC

## 2021-06-13 MED ORDER — PIRFENIDONE 267 MG PO TABS: 801.0000 mg | ORAL_TABLET | Freq: Two times a day (BID) | ORAL | 2 refills | Status: DC

## 2021-06-13 NOTE — Telephone Encounter (Signed)
Patient has been off of Esbriet therapy for > 2 weeks. She will need to re-titrate.  Rx sent to [facility] for starter and maintenance dose. Esbriet 267mg  - 1 tab twice daily x 7 days, 2 tabs twice daily x 7 days, then 3 tabs twice daily thereafter  Patient was previously only taking 801mg  twice daily. Can change to 801mg  tablet once stable without side effects  Knox Saliva, PharmD, MPH, BCPS Clinical Pharmacist (Rheumatology and Pulmonology)

## 2021-06-15 ENCOUNTER — Ambulatory Visit (HOSPITAL_COMMUNITY)
Admission: RE | Admit: 2021-06-15 | Discharge: 2021-06-15 | Disposition: A | Discharge status: Home / Self Care | Payer: Medicare Other | Source: Ambulatory Visit | Attending: Cardiology | Admitting: Cardiology

## 2021-06-15 ENCOUNTER — Ambulatory Visit (HOSPITAL_BASED_OUTPATIENT_CLINIC_OR_DEPARTMENT_OTHER)
Admission: RE | Admit: 2021-06-15 | Discharge: 2021-06-15 | Disposition: A | Discharge status: Home / Self Care | Payer: Medicare Other | Source: Ambulatory Visit | Attending: Cardiovascular Disease | Admitting: Cardiovascular Disease

## 2021-06-15 ENCOUNTER — Ambulatory Visit (HOSPITAL_COMMUNITY)
Admission: RE | Admit: 2021-06-15 | Payer: Medicare Other | Source: Ambulatory Visit | Attending: Cardiovascular Disease | Admitting: Cardiovascular Disease

## 2021-06-15 ENCOUNTER — Other Ambulatory Visit: Payer: Self-pay

## 2021-06-15 DIAGNOSIS — I739 Peripheral vascular disease, unspecified: Secondary | ICD-10-CM | POA: Insufficient documentation

## 2021-06-15 DIAGNOSIS — Z95828 Presence of other vascular implants and grafts: Secondary | ICD-10-CM | POA: Diagnosis not present

## 2021-06-16 ENCOUNTER — Other Ambulatory Visit (HOSPITAL_COMMUNITY): Payer: Self-pay

## 2021-06-16 ENCOUNTER — Telehealth: Payer: Self-pay | Admitting: Pulmonary Disease

## 2021-06-16 ENCOUNTER — Ambulatory Visit (HOSPITAL_COMMUNITY): Payer: Medicare Other

## 2021-06-16 DIAGNOSIS — J449 Chronic obstructive pulmonary disease, unspecified: Secondary | ICD-10-CM

## 2021-06-16 MED ORDER — STIOLTO RESPIMAT 2.5-2.5 MCG/ACT IN AERS
2.0000 | INHALATION_SPRAY | Freq: Every day | RESPIRATORY_TRACT | 2 refills | Status: DC
Start: 2021-06-16 — End: 2022-01-03

## 2021-06-16 NOTE — Telephone Encounter (Signed)
Can you please check on this patient. The daughter called and said that the pharmacy did not get the prescription for restart of esbriet. Thanks

## 2021-06-16 NOTE — Telephone Encounter (Addendum)
Called [facility] for patient's Esbriet rx that was e-scribed on 06/13/21  Unable to run test claim as patient's rx fill is currently in process.  Per [facility], there is no copay. Able to conference call patient onto phone. Shipment of starter dose of medication expected to arrive to patient's home tomorrow, 06/17/21  Order #: [id number]  Patient requested refill of Stiolto be resent to [facility].  Patient has also been advised that generic Esbriet will be shipped and the medication she will receive tomorrow may not say Esbriet in large letters as it did before. She verbalized understanding. She will plan to call every month to schedule refills since pharmacy does not have auto-refills for speciality medications   Knox Saliva, PharmD, MPH, BCPS Clinical Pharmacist (Rheumatology and Pulmonology)

## 2021-07-04 ENCOUNTER — Other Ambulatory Visit: Payer: Self-pay | Admitting: Family Medicine

## 2021-07-04 DIAGNOSIS — I10 Essential (primary) hypertension: Secondary | ICD-10-CM

## 2021-07-11 ENCOUNTER — Other Ambulatory Visit (INDEPENDENT_AMBULATORY_CARE_PROVIDER_SITE_OTHER): Payer: Medicare Other

## 2021-07-11 ENCOUNTER — Other Ambulatory Visit: Payer: Self-pay

## 2021-07-11 DIAGNOSIS — D509 Iron deficiency anemia, unspecified: Secondary | ICD-10-CM

## 2021-07-11 DIAGNOSIS — Z5181 Encounter for therapeutic drug level monitoring: Secondary | ICD-10-CM

## 2021-07-11 LAB — COMPREHENSIVE METABOLIC PANEL
ALT: 21 U/L (ref 0–35)
AST: 22 U/L (ref 0–37)
Albumin: 4.1 g/dL (ref 3.5–5.2)
Alkaline Phosphatase: 64 U/L (ref 39–117)
BUN: 20 mg/dL (ref 6–23)
CO2: 28 mEq/L (ref 19–32)
Calcium: 9.4 mg/dL (ref 8.4–10.5)
Chloride: 105 mEq/L (ref 96–112)
Creatinine, Ser: 1.3 mg/dL — ABNORMAL HIGH (ref 0.40–1.20)
GFR: 41.14 mL/min — ABNORMAL LOW (ref >60.00)
Glucose, Bld: 96 mg/dL (ref 70–99)
Potassium: 4.1 mEq/L (ref 3.5–5.1)
Sodium: 142 mEq/L (ref 135–145)
Total Bilirubin: 0.4 mg/dL (ref 0.2–1.2)
Total Protein: 6.7 g/dL (ref 6.0–8.3)

## 2021-07-11 NOTE — Addendum Note (Signed)
Addended by: Manuela Schwartz on: 07/11/2021 10:15 AM   Modules accepted: Orders

## 2021-07-11 NOTE — Addendum Note (Signed)
Addended by: Manuela Schwartz on: 07/11/2021 10:32 AM   Modules accepted: Orders

## 2021-07-20 ENCOUNTER — Ambulatory Visit (INDEPENDENT_AMBULATORY_CARE_PROVIDER_SITE_OTHER): Payer: Medicare Other

## 2021-07-20 VITALS — Ht 62.0 in | Wt 121.0 lb

## 2021-07-20 DIAGNOSIS — Z0000 Encounter for general adult medical examination without abnormal findings: Secondary | ICD-10-CM

## 2021-07-20 NOTE — Progress Notes (Signed)
Subjective:   Christine Meyer is a 70 y.o. female who presents for Medicare Annual (Subsequent) preventive examination.  I connected with Christine Meyer today by telephone and verified that I am speaking with the correct person using two identifiers. Location patient: home Location provider: work Persons participating in the virtual visit: patient, Marine scientist.    I discussed the limitations, risks, security and privacy concerns of performing an evaluation and management service by telephone and the availability of in person appointments. I also discussed with the patient that there may be a patient responsible charge related to this service. The patient expressed understanding and verbally consented to this telephonic visit.    Interactive audio and video telecommunications were attempted between this provider and patient, however failed, due to patient having technical difficulties OR patient did not have access to video capability.  We continued and completed visit with audio only.  Some vital signs may be absent or patient reported.   Time Spent with patient on telephone encounter: 20 minutes   Review of Systems     Cardiac Risk Factors include: advanced age (>14mn, >>63women);hypertension;diabetes mellitus;dyslipidemia;sedentary lifestyle;smoking/ tobacco exposure     Objective:    Today's Vitals   07/20/21 0901  Weight: 121 lb (54.9 kg)  Height: _0  (1.575 m)   Body mass index is 22.13 kg/m.  Advanced Directives 07/20/2021 12/08/2020 10/04/2020 10/03/2020 10/15/2016 11/22/2014 08/24/2014  Does Patient Have a Medical Advance Directive? _1  No No  Would patient like information on creating a medical advance directive? Yes (MAU/Ambulatory/Procedural Areas - Information given) No - Patient declined No - Patient declined No - Patient declined No - Patient declined No - patient declined information No - patient declined information    Current Medications (verified) Outpatient  Encounter Medications as of 07/20/2021  Medication Sig   ACCU-CHEK SOFTCLIX LANCETS lancets Check blood sugar once daily. Dx:E11.9   aspirin EC 81 MG tablet Take 1 tablet (81 mg total) by mouth daily.   atorvastatin (LIPITOR) 80 MG tablet Take 1 tablet (80 mg total) by mouth daily.   Blood Glucose Monitoring Suppl (ONETOUCH VERIO) w/Device KIT Use daily to check blood sugar.   DX E11.9   Calcium 500-100 MG-UNIT CHEW Chew 1 tablet by mouth daily before breakfast.   Cholecalciferol (VITAMIN D3) 50 MCG (2000 UT) capsule Take 1 capsule (2,000 Units total) by mouth daily.   clopidogrel (PLAVIX) 75 MG tablet Take 1 tablet (75 mg total) by mouth daily.   cyanocobalamin (,VITAMIN B-12,) 1000 MCG/ML injection INJECT INTO THE MUSCLE EVERY 30 DAYS   FEROSUL 325 (65 Fe) MG tablet TAKE 1 TABLET BY MOUTH DAILY   glucose blood (ONETOUCH VERIO) test strip USE AS DIRECTED DAILY   guaiFENesin (MUCINEX) 600 MG 12 hr tablet Take 1 tablet (600 mg total) by mouth 2 (two) times daily.   loratadine (CLARITIN) 10 MG tablet Take 1 tablet (10 mg total) by mouth daily.   metoprolol succinate (TOPROL-XL) 25 MG 24 hr tablet Take 1 tablet (25 mg total) by mouth daily.   Multiple Vitamins-Minerals (A THRU Z SELECT 50+ ADVANCED) TABS TAKE 1 TABLET BY MOUTH DAILY (Patient taking differently: Take 1 tablet by mouth daily.)   olmesartan (BENICAR) 20 MG tablet Take 1 tablet (20 mg total) by mouth daily.   pantoprazole (PROTONIX) 40 MG tablet Take 1 tablet (40 mg total) by mouth daily before breakfast.   Pirfenidone (ESBRIET) 267 MG TABS Take 1 tab by mouth twice daily for 7 days,  then 2 tab twice daily for 7 days ,then 3 tabs twice daily thereafter   Pirfenidone (ESBRIET) 267 MG TABS Take 3 tablets (801 mg total) by mouth 2 (two) times daily.   spironolactone (ALDACTONE) 25 MG tablet Take 1 tablet (25 mg total) by mouth daily.   SYRINGE-NEEDLE, DISP, 3 ML (BD INTEGRA SYRINGE) 25G X 1" 3 ML MISC USE TO INJECT B-12 ONCE A MONTH    Tiotropium Bromide-Olodaterol (STIOLTO RESPIMAT) 2.5-2.5 MCG/ACT AERS Inhale 2 puffs into the lungs daily.   benzonatate (TESSALON) 100 MG capsule Take 1 capsule (100 mg total) by mouth 2 (two) times daily as needed for cough.   COVID-19 mRNA Vac-TriS, Pfizer, (PFIZER-BIONT COVID-19 VAC-TRIS) SUSP injection Inject into the muscle.   Facility-Administered Encounter Medications as of 07/20/2021  Medication   sodium chloride flush (NS) 0.9 % injection 3 mL    Allergies (verified) Patient has no known allergies.   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   CAD (coronary artery disease)    Stent RCA 1999   Chronic kidney disease    COPD (chronic obstructive pulmonary disease) (HCC)    Diabetes mellitus, type 2 ([place])    Diet controlled   Gastric polyps - hyperplastic 02/23/2018   Hyperlipidemia    Hypertension    OSA (obstructive sleep apnea) [name]    PVD (peripheral vascular disease) (HCC)    Carotid stenosis, renal artery stenosis   Sleep apnea    dx'd but doesn't use mask    Uterine cancer ([place]) ?2778'E   Past Surgical History:  Procedure Laterality Date   ABDOMINAL ANGIOGRAM  11/22/2014   Procedure: ABDOMINAL ANGIOGRAM;  Surgeon: Lorretta Harp, MD;  [facility];  Service: Cardiovascular;;   ABDOMINAL AORTOGRAM W/LOWER EXTREMITY N/A 10/03/2020   Procedure: ABDOMINAL AORTOGRAM W/LOWER EXTREMITY;  Surgeon: Lorretta Harp, MD;  Location: [place] CV LAB;  Service: Cardiovascular;  Laterality: N/A;   COLONOSCOPY  2000   negative   CORONARY ANGIOPLASTY WITH STENT PLACEMENT  1999   RCA; Dr. Melvern Banker G4 P4   ESOPHAGOGASTRODUODENOSCOPY     ILIAC ARTERY STENT Right 11/22/2014   LOWER EXTREMITY ANGIOGRAM N/A 11/22/2014   Procedure: LOWER EXTREMITY ANGIOGRAM;  Surgeon: Lorretta Harp, MD; L-SFA 100%, 3v runoff, R-pCIA 95>>0% w/ 8 mm x 18 mm long  Balloon  expandable stent; R-dCIA 67>>0% w/ 8 mm x 4 cm long Cordis Smart Nitinol self  expanding stent    LOWER EXTREMITY ANGIOGRAPHY Bilateral 12/08/2020   Procedure: Lower Extremity Angiography;  Surgeon: Lorretta Harp, MD;  Location: [place] CV LAB;  Service: Cardiovascular;  Laterality: Bilateral;   PERIPHERAL VASCULAR ATHERECTOMY Left 12/08/2020   Procedure: PERIPHERAL VASCULAR ATHERECTOMY;  Surgeon: Lorretta Harp, MD;  Location: [place] CV LAB;  Service: Cardiovascular;  Laterality: Left;  common iliac   PERIPHERAL VASCULAR INTERVENTION Left 12/08/2020   Procedure: PERIPHERAL VASCULAR INTERVENTION;  Surgeon: Lorretta Harp, MD;  Location: [place] CV LAB;  Service: Cardiovascular;  Laterality: Left;  common iliac   RENAL ARTERY STENT Right May 2007; 08/2006   ; restenosis   TOTAL ABDOMINAL HYSTERECTOMY  ?4235'T    for uterine CA   VISCERAL ARTERY INTERVENTION N/A 10/03/2020   Procedure: VISCERAL ARTERY INTERVENTION;  Surgeon: Lorretta Harp, MD;  Location: [place] CV LAB;  Service: Cardiovascular;  Laterality: N/A;  SMA  Family History  Problem Relation Age of Onset   Heart failure Mother        after hip fx.    Hypertension Mother    Diabetes Mother    Deep vein thrombosis Mother    Hypertension Father    Diabetes Sister        insulin dependent   Hypertension Brother    Emphysema Brother    Diabetes Brother        insulin dependent   Diabetes Maternal Aunt        non-insulent dependent   Heart attack Paternal Aunt    Diabetes Maternal Grandmother        non-insulin dependent   Breast cancer Daughter    Social History   Socioeconomic History   Marital status: Single    Spouse name: Not on file   Number of children: 4   Years of education: Not on file   Highest education level: Not on file  Occupational History   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    Employer: [employer]  Tobacco Use   Smoking  status: Every Day    Packs/day: 0.25    Years: 51.00    Pack years: 12.75    Types: Cigarettes   Smokeless tobacco: Never   Tobacco comments:    currently smoking 2-3 cigs per day-06/08/21  Vaping Use   Vaping Use: Never used  Substance and Sexual Activity   Alcohol use: No    Alcohol/week: 0.0 standard drinks   Drug use: No   Sexual activity: Not Currently  Other Topics Concern   Not on file  Social History Narrative      Daughter [name] is CMA Financial controller primary care [place]   Retired Risk analyst   Smoker, no EtOH/drugs   Social Determinants of Radio broadcast assistant Strain: Low Risk    Difficulty of Paying Living Expenses: Not hard at all  Food Insecurity: No Food Insecurity   Worried About Charity fundraiser in the Last Year: Never true   Arboriculturist in the Last Year: Never true  Transportation Needs: No Transportation Needs   Lack of Transportation (Medical): No   Lack of Transportation (Non-Medical): No  Physical Activity: Inactive   Days of Exercise per Week: 0 days   Minutes of Exercise per Session: 0 min  Stress: No Stress Concern Present   Feeling of Stress : Not at all  Social Connections: Moderately Isolated   Frequency of Communication with Friends and Family: More than three times a week   Frequency of Social Gatherings with Friends and Family: More than three times a week   Attends Religious Services: More than 4 times per year   Active Member of [employer] or Organizations: No   Attends Archivist Meetings: Never   Marital Status: Never married    Tobacco Counseling Ready to quit: Not Answered Counseling given: Not Answered Tobacco comments: currently smoking 2-3 cigs per day-06/08/21   Clinical Intake:  Pre-visit preparation completed: Yes  Pain : No/denies pain     BMI - recorded: 22.13 Nutritional Status: BMI of 19-24  Normal Nutritional Risks: None Diabetes: Yes CBG done?: No Did pt. bring in CBG monitor from home?:  No (phone visit)  How often do you need to have someone help you when you read instructions, pamphlets, or other written materials from your doctor or pharmacy?: 1 - Never  Diabetes:  Is the patient  diabetic?  Yes  If diabetic, was a CBG obtained today?  No  Did the patient bring in their glucometer from home?  No phone visit How often do you monitor your CBG's? occasionally.   Financial Strains and Diabetes Management:  Are you having any financial strains with the device, your supplies or your medication? No .  Does the patient want to be seen by Chronic Care Management for management of their diabetes?  No  Would the patient like to be referred to a Nutritionist or for Diabetic Management?  No   Diabetic Exams:  Diabetic Eye Exam: Completed 04/24/2021-per patient-awaiting notes..   Diabetic Foot Exam: Pt has been advised about the importance in completing this exam. To be completed by PCP.   Interpreter Needed?: No  Information entered by :: Caroleen Hamman LPN   Activities of Daily Living In your present state of health, do you have any difficulty performing the following activities: 07/20/2021 12/08/2020  Hearing? N N  Vision? N N  Difficulty concentrating or making decisions? N N  Walking or climbing stairs? N N  Dressing or bathing? N N  Doing errands, shopping? N N  Preparing Food and eating ? N -  Using the Toilet? N -  In the past six months, have you accidently leaked urine? N -  Do you have problems with loss of bowel control? N -  Managing your Medications? N -  Managing your Finances? N -  Housekeeping or managing your Housekeeping? N -  Some recent data might be hidden    Patient Care Team: Carollee Herter, Alferd Apa, DO as PCP - General (Family Medicine) Lorretta Harp, MD as PCP - Cardiology (Cardiology) Calvert Cantor, MD as Consulting Physician (Ophthalmology) Juanito Doom, MD as Consulting Physician (Pulmonary Disease) Lorretta Harp, MD as  Consulting Physician (Cardiology)  Indicate any recent Medical Services you may have received from other than [facility] providers in the past year (date may be approximate).     Assessment:   This is a routine wellness examination for Christine Meyer.  Hearing/Vision screen Hearing Screening - Comments:: No issues Vision Screening - Comments:: Last eye exam-04/2021  Dietary issues and exercise activities discussed: Current Exercise Habits: The patient does not participate in regular exercise at present   Goals Addressed             This Visit's Progress    Patient Stated       Continue to drink plenty of water     Quit smoking / using tobacco   Not on track      Depression Screen PHQ 2/9 Scores 07/20/2021 01/09/2021 07/23/2019 01/24/2018 10/15/2016 09/08/2015 09/06/2014  PHQ - 2 Score 0 0 0 0 0 0 0    Fall Risk Fall Risk  07/20/2021 01/21/2020 01/24/2018 10/15/2016 09/08/2015  Falls in the past year? 0 0 No No No  Number falls in past yr: 0 0 - - -  Injury with Fall? 0 0 - - -  Follow up Falls prevention discussed Falls evaluation completed - - -    FALL RISK PREVENTION PERTAINING TO THE HOME:  Any stairs in or around the home? No  Home free of loose throw rugs in walkways, pet beds, electrical cords, etc? Yes  Adequate lighting in your home to reduce risk of falls? Yes   ASSISTIVE DEVICES UTILIZED TO PREVENT FALLS:  Life alert? No  Use of a cane, walker or w/c? No  Grab bars in the bathroom? Yes  Shower chair or bench in shower? Yes  Elevated toilet seat or a handicapped toilet? No   TIMED UP AND GO:  Was the test performed? No . Phone visit   Cognitive Function:Normal cognitive status assessed by this Nurse Health Advisor. No abnormalities found.   MMSE - Mini Mental State Exam 10/15/2016  Orientation to time 5  Orientation to Place 5  Registration 3  Attention/ Calculation 4  Recall 3  Language- name 2 objects 2  Language- repeat 1  Language- follow 3 step command 3   Language- read & follow direction 1  Write a sentence 1  Copy design 0  Total score 28        Immunizations Immunization History  Administered Date(s) Administered   Fluad Quad(high Dose 65+) 07/07/2020   Influenza, High Dose Seasonal PF 06/25/2017, 06/26/2018, 06/08/2019, 06/23/2021   PFIZER Comirnaty(Gray Top)Covid-19 Tri-Sucrose Vaccine 01/06/2021   PFIZER(Purple Top)SARS-COV-2 Vaccination 11/03/2019, 11/24/2019   Pneumococcal Conjugate-13 09/06/2014   Pneumococcal Polysaccharide-23 09/08/2015, 01/21/2020   Td 11/09/2003    TDAP status: Due, Education has been provided regarding the importance of this vaccine. Advised may receive this vaccine at local pharmacy or Health Dept. Aware to provide a copy of the vaccination record if obtained from local pharmacy or Health Dept. Verbalized acceptance and understanding.  Flu Vaccine status: Up to date  Pneumococcal vaccine status: Up to date  Covid-19 vaccine status: Information provided on how to obtain vaccines.   Qualifies for Shingles Vaccine? Yes   Zostavax completed No   Shingrix Completed?: No.    Education has been provided regarding the importance of this vaccine. Patient has been advised to call insurance company to determine out of pocket expense if they have not yet received this vaccine. Advised may also receive vaccine at local pharmacy or Health Dept. Verbalized acceptance and understanding.  Screening Tests Health Maintenance  Topic Date Due   Zoster Vaccines- Shingrix (1 of 2) Never done   TETANUS/TDAP  11/08/2013   FOOT EXAM  07/22/2020   OPHTHALMOLOGY EXAM  01/19/2021   COVID-19 Vaccine (4 - Booster for Pfizer series) 03/03/2021   HEMOGLOBIN A1C  07/12/2021   MAMMOGRAM  02/14/2023   COLONOSCOPY (Pts 45-40yr Insurance coverage will need to be confirmed)  02/19/2028   Pneumonia Vaccine 72 Years old  Completed   INFLUENZA VACCINE  Completed   DEXA SCAN  Completed   Hepatitis C Screening  Completed   HPV  VACCINES  Aged Out    Health Maintenance  Health Maintenance Due  Topic Date Due   Zoster Vaccines- Shingrix (1 of 2) Never done   TETANUS/TDAP  11/08/2013   FOOT EXAM  07/22/2020   OPHTHALMOLOGY EXAM  01/19/2021   COVID-19 Vaccine (4 - Booster for Pfizer series) 03/03/2021   HEMOGLOBIN A1C  07/12/2021    Colorectal cancer screening: Type of screening: Colonoscopy. Completed 02/18/2018. Repeat every 10 years  Mammogram status: Completed 02/13/2021. Repeat every year  Bone Density status: Completed 01/25/2020. Results reflect: Bone density results: OSTEOPENIA. Repeat every 2 years.  Lung Cancer Screening: (Low Dose CT Chest recommended if Age 580-80years, 30 pack-year currently smoking OR have quit w/in 15years.) does not qualify.     Additional Screening:  Hepatitis C Screening: Completed 12/03/2020  Vision Screening: Recommended annual ophthalmology exams for early detection of glaucoma and other disorders of the eye. Is the patient up to date with their annual eye exam?  Yes  Who is the provider or what is the name of the  office in which the patient attends annual eye exams? Patient unsure of name   Dental Screening: Recommended annual dental exams for proper oral hygiene  Community Resource Referral / Chronic Care Management: CRR required this visit?  No   CCM required this visit?  No      Plan:     I have personally reviewed and noted the following in the patient's chart:   Medical and social history Use of alcohol, tobacco or illicit drugs  Current medications and supplements including opioid prescriptions.  Functional ability and status Nutritional status Physical activity Advanced directives List of other physicians Hospitalizations, surgeries, and ER visits in previous 12 months Vitals Screenings to include cognitive, depression, and falls Referrals and appointments  In addition, I have reviewed and discussed with patient certain preventive protocols,  quality metrics, and best practice recommendations. A written personalized care plan for preventive services as well as general preventive health recommendations were provided to patient.   Due to this being a telephonic visit, the after visit summary with patients personalized plan was offered to patient via mail or my-chart.  Patient would like to access on my-chart.   Marta Antu, LPN   [id number]  Nurse Health Advisor  Nurse Notes: None

## 2021-07-20 NOTE — Patient Instructions (Signed)
Christine Meyer , Thank you for taking time to complete your Medicare Wellness Visit. I appreciate your ongoing commitment to your health goals. Please review the following plan we discussed and let me know if I can assist you in the future.   Screening recommendations/referrals: Colonoscopy: Colonoscopy. Completed 02/18/2018. Repeat every 10 years Mammogram:  Completed 02/13/2021. Repeat every year Bone Density: Completed 01/25/2020.  Repeat every 2 years. Recommended yearly ophthalmology/optometry visit for glaucoma screening and checkup Recommended yearly dental visit for hygiene and checkup  Vaccinations: Influenza vaccine: Up to date Pneumococcal vaccine: Up to date Tdap vaccine: Discuss with pharmacy Shingles vaccine:  Discuss with pharmacy   Covid-19:Booster available at the pharmacy  Advanced directives: Information mailed today  Conditions/risks identified: See problem list  Next appointment: Follow up in one year for your annual wellness visit 07/23/2022 @ 9:40   Preventive Care 72 Years and Older, Female Preventive care refers to lifestyle choices and visits with your health care provider that can promote health and wellness. What does preventive care include? A yearly physical exam. This is also called an annual well check. Dental exams once or twice a year. Routine eye exams. Ask your health care provider how often you should have your eyes checked. Personal lifestyle choices, including: Daily care of your teeth and gums. Regular physical activity. Eating a healthy diet. Avoiding tobacco and drug use. Limiting alcohol use. Practicing safe sex. Taking low-dose aspirin every day. Taking vitamin and mineral supplements as recommended by your health care provider. What happens during an annual well check? The services and screenings done by your health care provider during your annual well check will depend on your age, overall health, lifestyle risk factors, and family history  of disease. Counseling  Your health care provider may ask you questions about your: Alcohol use. Tobacco use. Drug use. Emotional well-being. Home and relationship well-being. Sexual activity. Eating habits. History of falls. Memory and ability to understand (cognition). Work and work Statistician. Reproductive health. Screening  You may have the following tests or measurements: Height, weight, and BMI. Blood pressure. Lipid and cholesterol levels. These may be checked every 5 years, or more frequently if you are over 35 years old. Skin check. Lung cancer screening. You may have this screening every year starting at age 72 if you have a 30-pack-year history of smoking and currently smoke or have quit within the past 15 years. Fecal occult blood test (FOBT) of the stool. You may have this test every year starting at age 72. Flexible sigmoidoscopy or colonoscopy. You may have a sigmoidoscopy every 5 years or a colonoscopy every 10 years starting at age 72. Hepatitis C blood test. Hepatitis B blood test. Sexually transmitted disease (STD) testing. Diabetes screening. This is done by checking your blood sugar (glucose) after you have not eaten for a while (fasting). You may have this done every 1-3 years. Bone density scan. This is done to screen for osteoporosis. You may have this done starting at age 72. Mammogram. This may be done every 1-2 years. Talk to your health care provider about how often you should have regular mammograms. Talk with your health care provider about your test results, treatment options, and if necessary, the need for more tests. Vaccines  Your health care provider may recommend certain vaccines, such as: Influenza vaccine. This is recommended every year. Tetanus, diphtheria, and acellular pertussis (Tdap, Td) vaccine. You may need a Td booster every 10 years. Zoster vaccine. You may need this after age 72.  Pneumococcal 13-valent conjugate (PCV13) vaccine. One  dose is recommended after age 72. Pneumococcal polysaccharide (PPSV23) vaccine. One dose is recommended after age 72. Talk to your health care provider about which screenings and vaccines you need and how often you need them. This information is not intended to replace advice given to you by your health care provider. Make sure you discuss any questions you have with your health care provider. Document Released: 09/23/2015 Document Revised: 05/16/2016 Document Reviewed: 06/28/2015 Elsevier Interactive Patient Education  2017 [place] Prevention in the Home Falls can cause injuries. They can happen to people of all ages. There are many things you can do to make your home safe and to help prevent falls. What can I do on the outside of my home? Regularly fix the edges of walkways and driveways and fix any cracks. Remove anything that might make you trip as you walk through a door, such as a raised step or threshold. Trim any bushes or trees on the path to your home. Use bright outdoor lighting. Clear any walking paths of anything that might make someone trip, such as rocks or tools. Regularly check to see if handrails are loose or broken. Make sure that both sides of any steps have handrails. Any raised decks and porches should have guardrails on the edges. Have any leaves, snow, or ice cleared regularly. Use sand or salt on walking paths during winter. Clean up any spills in your garage right away. This includes oil or grease spills. What can I do in the bathroom? Use night lights. Install grab bars by the toilet and in the tub and shower. Do not use towel bars as grab bars. Use non-skid mats or decals in the tub or shower. If you need to sit down in the shower, use a plastic, non-slip stool. Keep the floor dry. Clean up any water that spills on the floor as soon as it happens. Remove soap buildup in the tub or shower regularly. Attach bath mats securely with double-sided  non-slip rug tape. Do not have throw rugs and other things on the floor that can make you trip. What can I do in the bedroom? Use night lights. Make sure that you have a light by your bed that is easy to reach. Do not use any sheets or blankets that are too big for your bed. They should not hang down onto the floor. Have a firm chair that has side arms. You can use this for support while you get dressed. Do not have throw rugs and other things on the floor that can make you trip. What can I do in the kitchen? Clean up any spills right away. Avoid walking on wet floors. Keep items that you use a lot in easy-to-reach places. If you need to reach something above you, use a strong step stool that has a grab bar. Keep electrical cords out of the way. Do not use floor polish or wax that makes floors slippery. If you must use wax, use non-skid floor wax. Do not have throw rugs and other things on the floor that can make you trip. What can I do with my stairs? Do not leave any items on the stairs. Make sure that there are handrails on both sides of the stairs and use them. Fix handrails that are broken or loose. Make sure that handrails are as long as the stairways. Check any carpeting to make sure that it is firmly attached to the stairs. Fix any  carpet that is loose or worn. Avoid having throw rugs at the top or bottom of the stairs. If you do have throw rugs, attach them to the floor with carpet tape. Make sure that you have a light switch at the top of the stairs and the bottom of the stairs. If you do not have them, ask someone to add them for you. What else can I do to help prevent falls? Wear shoes that: Do not have high heels. Have rubber bottoms. Are comfortable and fit you well. Are closed at the toe. Do not wear sandals. If you use a stepladder: Make sure that it is fully opened. Do not climb a closed stepladder. Make sure that both sides of the stepladder are locked into place. Ask  someone to hold it for you, if possible. Clearly mark and make sure that you can see: Any grab bars or handrails. First and last steps. Where the edge of each step is. Use tools that help you move around (mobility aids) if they are needed. These include: Canes. Walkers. Scooters. Crutches. Turn on the lights when you go into a dark area. Replace any light bulbs as soon as they burn out. Set up your furniture so you have a clear path. Avoid moving your furniture around. If any of your floors are uneven, fix them. If there are any pets around you, be aware of where they are. Review your medicines with your doctor. Some medicines can make you feel dizzy. This can increase your chance of falling. Ask your doctor what other things that you can do to help prevent falls. This information is not intended to replace advice given to you by your health care provider. Make sure you discuss any questions you have with your health care provider. Document Released: 06/23/2009 Document Revised: 02/02/2016 Document Reviewed: 10/01/2014 Elsevier Interactive Patient Education  2017 [employer].

## 2021-07-28 ENCOUNTER — Ambulatory Visit (INDEPENDENT_AMBULATORY_CARE_PROVIDER_SITE_OTHER): Payer: Medicare Other | Admitting: Cardiovascular Disease

## 2021-07-28 ENCOUNTER — Other Ambulatory Visit: Payer: Self-pay

## 2021-07-28 ENCOUNTER — Encounter: Payer: Self-pay | Admitting: Cardiovascular Disease

## 2021-07-28 DIAGNOSIS — I10 Essential (primary) hypertension: Secondary | ICD-10-CM

## 2021-07-28 DIAGNOSIS — I2510 Atherosclerotic heart disease of native coronary artery without angina pectoris: Secondary | ICD-10-CM

## 2021-07-28 DIAGNOSIS — E785 Hyperlipidemia, unspecified: Secondary | ICD-10-CM

## 2021-07-28 DIAGNOSIS — I701 Atherosclerosis of renal artery: Secondary | ICD-10-CM

## 2021-07-28 DIAGNOSIS — F17200 Nicotine dependence, unspecified, uncomplicated: Secondary | ICD-10-CM

## 2021-07-28 DIAGNOSIS — I739 Peripheral vascular disease, unspecified: Secondary | ICD-10-CM | POA: Diagnosis not present

## 2021-07-28 NOTE — Assessment & Plan Note (Signed)
History of CAD status post myocardial infarction back in 1999.  She underwent stenting of her RCA by Dr. Melvern Banker at that time.  She denies chest pain.

## 2021-07-28 NOTE — Assessment & Plan Note (Signed)
History of moderate left ICA stenosis by duplex ultrasound measured 08/16/2020.  We will repeat her carotid Doppler studies.

## 2021-07-28 NOTE — Assessment & Plan Note (Signed)
History of dyslipidemia on statin therapy with lipid profile performed 01/09/2021 revealing total cholesterol 156, LDL of 83 and HDL 59.

## 2021-07-28 NOTE — Patient Instructions (Signed)
Medication Instructions:  Your physician recommends that you continue on your current medications as directed. Please refer to the Current Medication list given to you today.  *If you need a refill on your cardiac medications before your next appointment, please call your pharmacy*  Testing/Procedures: Dr. Gwenlyn Found has recommended that you have an Ultrasound of your AORTA/IVC/ILIACS.   To prepare for this test:  No food after 11PM the night before. Water is OK. (Don't drink liquids if you have been instructed not to for ANOTHER test).  Avoid foods that produce bowel gas, for 24 hours prior to exam (see below). No breakfast, no chewing gum, no smoking or carbonated beverages. Patient may take morning medications with water. Come in for test at least 15 minutes early to register.  Your physician has requested that you have an ankle brachial index (ABI). During this test an ultrasound and blood pressure cuff are used to evaluate the arteries that supply the arms and legs with blood. Allow thirty minutes for this exam. There are no restrictions or special instructions.  Your physician has requested that you have a renal artery duplex. During this test, an ultrasound is used to evaluate blood flow to the kidneys. Allow one hour for this exam. Do not eat after midnight the day before and avoid carbonated beverages. Take your medications as you usually do.   Your physician has requested that you have a carotid duplex. This test is an ultrasound of the carotid arteries in your neck. It looks at blood flow through these arteries that supply the brain with blood. Allow one hour for this exam. There are no restrictions or special instructions.  And Mesenteric Ultrasound.  To be done in Oct. of 2023. These procedures are done at [facility].    Follow-Up: At [facility], you and your health needs are our priority.  As part of our continuing mission to provide you with exceptional heart care,  we have created designated Provider Care Teams.  These Care Teams include your primary Cardiologist (physician) and Advanced Practice Providers (APPs -  Physician Assistants and Nurse Practitioners) who all work together to provide you with the care you need, when you need it.  We recommend signing up for the patient portal called "MyChart".  Sign up information is provided on this After Visit Summary.  MyChart is used to connect with patients for Virtual Visits (Telemedicine).  Patients are able to view lab/test results, encounter notes, upcoming appointments, etc.  Non-urgent messages can be sent to your provider as well.   To learn more about what you can do with MyChart, go to [email or web address].    Your next appointment:   12 month(s)  The format for your next appointment:   In Person  Provider:   Quay Burow, MD

## 2021-07-28 NOTE — Assessment & Plan Note (Signed)
Still smoking 2 to 3 cigarettes a day.

## 2021-07-28 NOTE — Assessment & Plan Note (Signed)
History of bilateral renal artery stenosis status post right renal artery stenting in the past.  Her recent angiogram revealed a patent right renal artery stent.

## 2021-07-28 NOTE — Assessment & Plan Note (Signed)
History of essential hypertension a blood pressure measured today at 144/66.  She is on metoprolol and Benicar.

## 2021-07-28 NOTE — Assessment & Plan Note (Signed)
History of peripheral vascular disease disease status post right iliac stenting which I performed back in 2016.  Because of recurrent claudication as well as mesenteric ischemia performed angiography 10/03/2020 revealing a patent right extrailiac artery stent with high-grade left common iliac artery stenosis which I ultimately stented with a VBX stent 12/08/2020.  Her SFAs are known to be occluded.  She no longer has claudication.  Her most recent Doppler studies performed 06/15/2021 revealed ABIs in the 0.45 range bilaterally with moderate in-stent restenosis within the right external iliac artery and at patent left iliac stent.

## 2021-07-28 NOTE — Progress Notes (Signed)
07/28/2021 Christine Meyer   29-Mar-1951  [id number]  Primary Physician Christine Held, DO Primary Cardiologist: Lorretta Harp MD Christine Meyer, [place]  HPI:  Christine Meyer is a 70 y.o.  thin appearing single African-American female mother of 4 children . She was referred by Dr. Percival Meyer , her cardiologist,  for peripheral vascular evaluation. I last saw her in the office 12/28/2020.  She is accompanied by her daughter Christine Meyer today.  She has a history of myocardial infarction back in 1999 undergoing stenting of her RCA by Dr. Melvern Meyer.her chronic risk factors are notable for continued tobacco abuse, treated hypertension and hyperlipidemia. She is complained of increasing dyspnea on exertion over the last 6 months as well as right calf claudication. Dr. Percival Meyer saw her  and ordered a stress test. 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 She has had right renal artery stenting in the past as well as right external iliac artery stenting as well. 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 I stented both of these areas. She did have an occluded left SFA with high-grade segmental diffuse right SFA stenosis. Her Dopplers and symptoms improved.   She had been complaining of some abdominal pain and is experiencing some weight loss.  She saw Dr. Carlean Meyer, her gastroenterologist who obtained an abdominal CTA and referred her to Dr. Stanford Meyer , vascular surgeon for consideration of peripheral angiography and treatment of mesenteric ischemia.  She had carotid Dopplers that showed moderate left ICA stenosis and lower extremity Dopplers performed today that suggested in-stent restenosis within her right iliac stent.   I performed angiography on her 10/03/2020 revealing patent right iliac stent, total  SFAs bilaterally, 90% calcified proximal left common iliac artery stenosis, subtotally occluded celiac axis at the origin and 90% SMA which I stented successfully.  She was discharged home the following day.  She did have a slight drop in her hemoglobin but no evidence of hematoma or retroperitoneal bleed.  Her follow-up Dopplers performed a week later showed a widely patent SMA.  Her abdominal pain resolved after the intervention and she had dinner in the hospital that evening without discomfort.  She states since gained 5 pounds.  She does complain of left lower extremity claudication however wishes to have this intervened on in the near future.   Since I saw her 7 months ago she continues to do well.  She denies claudication or abdominal pain.  She has gained 20 pounds back.  She denies chest pain but does have chronic shortness of breath because of COPD.  Her Doppler studies performed revealed moderate left ICA stenosis, patent left iliac stent and mesenteric stent as well.  She continues to smoke 2 to 3 cigarettes a day.   Current Meds  Medication Sig   ACCU-CHEK SOFTCLIX LANCETS lancets Check blood sugar once daily. Dx:E11.9   aspirin EC 81 MG tablet Take 1 tablet (81 mg total) by mouth daily.   atorvastatin (LIPITOR) 80 MG tablet Take 1 tablet (80 mg total) by mouth daily.   Blood Glucose Monitoring Suppl (ONETOUCH VERIO) w/Device KIT Use daily to check blood sugar.   DX E11.9   Calcium 500-100 MG-UNIT CHEW Chew 1 tablet by mouth daily before breakfast.   Cholecalciferol (VITAMIN D3) 50 MCG (2000 UT) capsule Take 1 capsule (2,000  Units total) by mouth daily.   clopidogrel (PLAVIX) 75 MG tablet Take 1 tablet (75 mg total) by mouth daily.   cyanocobalamin (,VITAMIN B-12,) 1000 MCG/ML injection INJECT INTO THE MUSCLE EVERY 30 DAYS   FEROSUL 325 (65 Fe) MG tablet TAKE 1 TABLET BY MOUTH DAILY   glucose blood (ONETOUCH VERIO) test strip USE AS DIRECTED DAILY   guaiFENesin (MUCINEX) 600 MG 12 hr  tablet Take 1 tablet (600 mg total) by mouth 2 (two) times daily. (Patient taking differently: Take 600 mg by mouth 2 (two) times daily as needed.)   loratadine (CLARITIN) 10 MG tablet Take 1 tablet (10 mg total) by mouth daily. (Patient taking differently: Take 10 mg by mouth daily as needed.)   metoprolol succinate (TOPROL-XL) 25 MG 24 hr tablet Take 1 tablet (25 mg total) by mouth daily.   Multiple Vitamins-Minerals (A THRU Z SELECT 50+ ADVANCED) TABS TAKE 1 TABLET BY MOUTH DAILY (Patient taking differently: Take 1 tablet by mouth daily.)   olmesartan (BENICAR) 20 MG tablet Take 1 tablet (20 mg total) by mouth daily.   pantoprazole (PROTONIX) 40 MG tablet Take 1 tablet (40 mg total) by mouth daily before breakfast.   Pirfenidone (ESBRIET) 267 MG TABS Take 3 tablets (801 mg total) by mouth 2 (two) times daily.   spironolactone (ALDACTONE) 25 MG tablet Take 1 tablet (25 mg total) by mouth daily.   SYRINGE-NEEDLE, DISP, 3 ML (BD INTEGRA SYRINGE) 25G X 1" 3 ML MISC USE TO INJECT B-12 ONCE A MONTH   Tiotropium Bromide-Olodaterol (STIOLTO RESPIMAT) 2.5-2.5 MCG/ACT AERS Inhale 2 puffs into the lungs daily.   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    Employer: [employer]  Tobacco Use   Smoking status: Every Day    Packs/day: 0.25    Years: 51.00    Pack years: 12.75    Types: Cigarettes   Smokeless tobacco: Never   Tobacco comments:    currently smoking 2-3 cigs per day-06/08/21  Vaping Use   Vaping Use: Never used  Substance and  Sexual Activity   Alcohol use: No    Alcohol/week: 0.0 standard drinks   Drug use: No   Sexual activity: Not Currently  Other Topics Concern   Not on file  Social History Narrative      Daughter Christine Meyer is CMA Financial controller primary care [place]   Retired Risk analyst   Smoker, no EtOH/drugs   Social Determinants of Radio broadcast assistant Strain: Low Risk    Difficulty of Paying Living Expenses: Not hard at all  Food Insecurity: No Food Insecurity   Worried About Charity fundraiser in the Last Year: Never true   Arboriculturist in the Last Year: Never true  Transportation Needs: No Transportation Needs   Lack of Transportation (Medical): No   Lack of Transportation (Non-Medical): No  Physical Activity: Inactive   Days of Exercise per Week: 0 days   Minutes of Exercise per Session: 0 min  Stress: No Stress Concern Present   Feeling of Stress : Not at all  Social Connections: Moderately Isolated   Frequency of Communication with Friends and Family: More than three times a week   Frequency of Social Gatherings with Friends and Family: More than three times a week   Attends Religious Services: More than 4 times per year   Active Member of [employer] or Organizations: No   Attends Archivist Meetings: Never   Marital Status: Never married  Human resources officer Violence: Not At Risk   Fear of Current or Ex-Partner: No   Emotionally Abused: No   Physically Abused: No   Sexually Abused: No     Review of Systems: General: negative for chills, fever, night sweats or weight changes.  Cardiovascular: negative for chest pain, dyspnea on exertion, edema, orthopnea, palpitations, paroxysmal nocturnal dyspnea or shortness of breath Dermatological: negative for rash Respiratory: negative for cough or wheezing Urologic: negative for hematuria Abdominal: negative for nausea, vomiting, diarrhea, bright red blood per rectum, melena, or hematemesis Neurologic: negative for visual  changes, syncope, or dizziness All other systems reviewed and are otherwise negative except as noted above.    Blood pressure (!) 144/66, pulse 70, height 5' 2" (1.575 m), weight 120 lb 12.8 oz (54.8 kg), SpO2 95 %.  General appearance: alert and no distress Neck: no adenopathy, no carotid bruit, no JVD, supple, symmetrical, trachea midline, and thyroid not enlarged, symmetric, no tenderness/mass/nodules Lungs: clear to auscultation bilaterally Heart: regular rate and rhythm, S1, S2 normal, no murmur, click, rub or gallop Extremities: extremities normal, atraumatic, no cyanosis or edema Pulses: 2+ and symmetric Skin: Skin color, texture, turgor normal. No rashes or lesions Neurologic: Grossly normal  EKG sinus rhythm at 70 without ST or T wave changes.  Personally reviewed this EKG.  ASSESSMENT AND PLAN:   Essential hypertension History of essential hypertension a blood pressure measured today at 144/66.  She is on metoprolol and Benicar.  NONDEPENDENT TOBACCO USE DISORDER Still smoking 2 to 3 cigarettes a day.  CAROTID BRUITS, BILATERAL History of moderate left ICA stenosis by duplex ultrasound measured 08/16/2020.  We will repeat her carotid Doppler studies.  Coronary atherosclerosis History of CAD status post myocardial infarction back in 1999.  She underwent stenting of her RCA by Dr. Melvern Meyer at that time.  She denies chest pain.  Dyslipidemia History of dyslipidemia on statin therapy with lipid profile performed 01/09/2021 revealing total cholesterol 156, LDL of 83 and HDL 59.  Peripheral arterial disease ([place]) History of peripheral vascular disease disease status post right iliac stenting which I performed back in 2016.  Because of recurrent claudication as well as mesenteric ischemia performed angiography 10/03/2020 revealing a patent right extrailiac artery stent with high-grade left common iliac artery stenosis which I ultimately stented with a VBX stent 12/08/2020.  Her SFAs  are known to be occluded.  She no longer has claudication.  Her most recent Doppler studies performed 06/15/2021 revealed ABIs in the 0.45 range bilaterally with moderate in-stent restenosis within the right external iliac artery and at patent left iliac stent.  Renal artery stenosis, native, bilateral (HCC) History of bilateral renal artery stenosis status post right renal artery stenting in the past.  Her recent angiogram revealed a patent right renal artery stent.  Mesenteric ischemia (HCC) History of mesenteric ischemia status post SMA stenting by myself 10/03/2020.  Her follow-up Dopplers revealed this to be patent.  After that she no longer had symptomatic abdominal angina and gained 20  pounds.     Lorretta Harp MD FACP,FACC,FAHA, [facility] 07/28/2021 11:12 AM

## 2021-07-28 NOTE — Assessment & Plan Note (Signed)
History of mesenteric ischemia status post SMA stenting by myself 10/03/2020.  Her follow-up Dopplers revealed this to be patent.  After that she no longer had symptomatic abdominal angina and gained 20 pounds.

## 2021-08-04 ENCOUNTER — Other Ambulatory Visit: Payer: Self-pay

## 2021-08-04 ENCOUNTER — Emergency Department (HOSPITAL_BASED_OUTPATIENT_CLINIC_OR_DEPARTMENT_OTHER): Payer: Medicare Other

## 2021-08-04 ENCOUNTER — Emergency Department (HOSPITAL_BASED_OUTPATIENT_CLINIC_OR_DEPARTMENT_OTHER)
Admission: EM | Admit: 2021-08-04 | Discharge: 2021-08-04 | Disposition: A | Discharge status: Home / Self Care | Payer: Medicare Other | Attending: Emergency Medicine | Admitting: Emergency Medicine

## 2021-08-04 ENCOUNTER — Encounter (HOSPITAL_BASED_OUTPATIENT_CLINIC_OR_DEPARTMENT_OTHER): Payer: Self-pay | Admitting: *Deleted

## 2021-08-04 DIAGNOSIS — Z7951 Long term (current) use of inhaled steroids: Secondary | ICD-10-CM | POA: Diagnosis not present

## 2021-08-04 DIAGNOSIS — Z28310 Unvaccinated for covid-19: Secondary | ICD-10-CM | POA: Diagnosis not present

## 2021-08-04 DIAGNOSIS — J441 Chronic obstructive pulmonary disease with (acute) exacerbation: Secondary | ICD-10-CM | POA: Diagnosis not present

## 2021-08-04 DIAGNOSIS — D631 Anemia in chronic kidney disease: Secondary | ICD-10-CM | POA: Diagnosis not present

## 2021-08-04 DIAGNOSIS — Z7902 Long term (current) use of antithrombotics/antiplatelets: Secondary | ICD-10-CM | POA: Diagnosis not present

## 2021-08-04 DIAGNOSIS — Z7982 Long term (current) use of aspirin: Secondary | ICD-10-CM | POA: Insufficient documentation

## 2021-08-04 DIAGNOSIS — I2510 Atherosclerotic heart disease of native coronary artery without angina pectoris: Secondary | ICD-10-CM | POA: Diagnosis not present

## 2021-08-04 DIAGNOSIS — Z79899 Other long term (current) drug therapy: Secondary | ICD-10-CM | POA: Diagnosis not present

## 2021-08-04 DIAGNOSIS — N189 Chronic kidney disease, unspecified: Secondary | ICD-10-CM | POA: Insufficient documentation

## 2021-08-04 DIAGNOSIS — F17210 Nicotine dependence, cigarettes, uncomplicated: Secondary | ICD-10-CM | POA: Insufficient documentation

## 2021-08-04 DIAGNOSIS — I129 Hypertensive chronic kidney disease with stage 1 through stage 4 chronic kidney disease, or unspecified chronic kidney disease: Secondary | ICD-10-CM | POA: Insufficient documentation

## 2021-08-04 DIAGNOSIS — Z8542 Personal history of malignant neoplasm of other parts of uterus: Secondary | ICD-10-CM | POA: Diagnosis not present

## 2021-08-04 DIAGNOSIS — Z951 Presence of aortocoronary bypass graft: Secondary | ICD-10-CM | POA: Diagnosis not present

## 2021-08-04 DIAGNOSIS — E1122 Type 2 diabetes mellitus with diabetic chronic kidney disease: Secondary | ICD-10-CM | POA: Insufficient documentation

## 2021-08-04 DIAGNOSIS — R0602 Shortness of breath: Secondary | ICD-10-CM | POA: Insufficient documentation

## 2021-08-04 DIAGNOSIS — Z20822 Contact with and (suspected) exposure to covid-19: Secondary | ICD-10-CM | POA: Insufficient documentation

## 2021-08-04 LAB — CBC WITH DIFFERENTIAL/PLATELET
Abs Immature Granulocytes: 0.01 10*3/uL (ref 0.00–0.07)
Basophils Absolute: 0 10*3/uL (ref 0.0–0.1)
Basophils Relative: 0 %
Eosinophils Absolute: 0.1 10*3/uL (ref 0.0–0.5)
Eosinophils Relative: 1 %
HCT: 33.3 % — ABNORMAL LOW (ref 36.0–46.0)
Hemoglobin: 11.1 g/dL — ABNORMAL LOW (ref 12.0–15.0)
Immature Granulocytes: 0 %
Lymphocytes Relative: 21 %
Lymphs Abs: 1.9 10*3/uL (ref 0.7–4.0)
MCH: 29.4 pg (ref 26.0–34.0)
MCHC: 33.3 g/dL (ref 30.0–36.0)
MCV: 88.1 fL (ref 80.0–100.0)
Monocytes Absolute: 0.7 10*3/uL (ref 0.1–1.0)
Monocytes Relative: 7 %
Neutro Abs: 6.3 10*3/uL (ref 1.7–7.7)
Neutrophils Relative %: 71 %
Platelets: 255 10*3/uL (ref 150–400)
RBC: 3.78 MIL/uL — ABNORMAL LOW (ref 3.87–5.11)
RDW: 14.1 % (ref 11.5–15.5)
WBC: 9 10*3/uL (ref 4.0–10.5)
nRBC: 0 % (ref 0.0–0.2)

## 2021-08-04 LAB — BASIC METABOLIC PANEL
Anion gap: 12 (ref 5–15)
BUN: 16 mg/dL (ref 8–23)
CO2: 22 mmol/L (ref 22–32)
Calcium: 9 mg/dL (ref 8.9–10.3)
Chloride: 102 mmol/L (ref 98–111)
Creatinine, Ser: 1.21 mg/dL — ABNORMAL HIGH (ref 0.44–1.00)
GFR, Estimated: 48 mL/min — ABNORMAL LOW (ref >60)
Glucose, Bld: 91 mg/dL (ref 70–99)
Potassium: 4.1 mmol/L (ref 3.5–5.1)
Sodium: 136 mmol/L (ref 135–145)

## 2021-08-04 LAB — RESP PANEL BY RT-PCR (FLU A&B, COVID) ARPGX2
Influenza A by PCR: NEGATIVE
Influenza B by PCR: NEGATIVE
SARS Coronavirus 2 by RT PCR: NEGATIVE

## 2021-08-04 MED ORDER — DOXYCYCLINE HYCLATE 100 MG PO CAPS: 100.0000 mg | ORAL_CAPSULE | Freq: Two times a day (BID) | ORAL | 0 refills | Status: AC

## 2021-08-04 MED ORDER — AMOXICILLIN-POT CLAVULANATE 875-125 MG PO TABS: 1.0000 | ORAL_TABLET | Freq: Two times a day (BID) | ORAL | 0 refills | Status: AC

## 2021-08-04 MED ORDER — IPRATROPIUM-ALBUTEROL 0.5-2.5 (3) MG/3ML IN SOLN
3.0000 mL | Freq: Once | RESPIRATORY_TRACT | Status: AC
  Administered 2021-08-04: 3 mL via RESPIRATORY_TRACT
  Filled 2021-08-04: qty 3

## 2021-08-04 MED ORDER — AEROCHAMBER PLUS FLO-VU SMALL MISC
1.0000 | Freq: Once | Status: AC
  Administered 2021-08-04: 1
  Filled 2021-08-04: qty 1

## 2021-08-04 MED ORDER — ALBUTEROL SULFATE HFA 108 (90 BASE) MCG/ACT IN AERS
1.0000 | INHALATION_SPRAY | Freq: Once | RESPIRATORY_TRACT | Status: AC
  Administered 2021-08-04: 1 via RESPIRATORY_TRACT
  Filled 2021-08-04: qty 6.7

## 2021-08-04 NOTE — ED Provider Notes (Signed)
[facility] EMERGENCY DEPARTMENT Provider Note   CSN: [id number] Arrival date & time: 08/04/21  1252     History Chief Complaint  Patient presents with   Shortness of Breath    Christine Meyer is a 70 y.o. female.  HPI  Patient with significant medical history including hypertension, carotid stenosis, CAD status post stent, PAD, pulmonary fibrosis COPD presents  with chief complaint of shortness of breath x3 weeks.  Patient states she has had increased sputum production, nasal drip, becomes more short of breath on exertion and feeling generalized fatigue.  She denies actual chest pain, pleuritic chest pain, denies peripheral edema, orthopnea, she denies fevers, chills, stomach pain, nausea, vomit, diarrhea, general body aches.  She denies  recent sick contacts, not vaccinated COVID or influenza.  She states that she has been taking her Symbicort and Mucinex without much relief.  She has no other complaints at this time.  Denies alleviating or aggravating factors.  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   CAD (coronary artery disease)    Stent RCA 1999   Chronic kidney disease    COPD (chronic obstructive pulmonary disease) (HCC)    Diabetes mellitus, type 2 ([place])    Diet controlled   Gastric polyps - hyperplastic 02/23/2018   Hyperlipidemia    Hypertension    OSA (obstructive sleep apnea) [name]    PVD (peripheral vascular disease) (HCC)    Carotid stenosis, renal artery stenosis   Sleep apnea    dx'd but doesn't use mask    Uterine cancer ([place]) ?1980's    Patient Active Problem List   Diagnosis Date Noted   Claudication in peripheral vascular disease ([place]) 12/08/2020   Mesenteric ischemia ([place]) 08/16/2020   Bronchitis 07/10/2020   COPD with acute exacerbation ([place]) 06/30/2020   Rheumatoid arthritis involving multiple sites ([place]) 01/21/2020   Coagulation  disorder ([place]) 01/21/2020   Uncontrolled type 2 diabetes mellitus with hyperglycemia ([place]) 01/21/2020   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   Hyperlipidemia LDL goal <70 06/28/2018   Degenerative disc disease, cervical 03/27/2018   Gastric polyps - hyperplastic 02/23/2018   Antiplatelet or antithrombotic long-term use - clopidogrel - CAD/PAD 02/13/2018   Osteopenia 01/26/2018   Dyspepsia 01/26/2018   Rotator cuff tear, left 01/24/2018   COPD (chronic obstructive pulmonary disease) ([place]) 07/26/2017   Weight loss 04/22/2017   Cough 04/22/2017   Current every day smoker 04/22/2017   Bilateral carotid artery disease ([place]) 07/04/2016   Claudication ([place]) 11/22/2014   Peripheral arterial disease ([place]) 10/22/2014   Renal artery stenosis, native, bilateral ([place]) 10/22/2014   Centrilobular emphysema ([place]) 10/19/2014   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   Pain in joint, lower leg 08/24/2014   Dyslipidemia 03/12/2011   Coronary atherosclerosis 09/19/2010   GERD 02/23/2010   NONDEPENDENT TOBACCO USE DISORDER 10/14/2009   EDEMA- LOCALIZED 04/06/2008   CAROTID BRUITS, BILATERAL 04/06/2008   Disorder resulting from impaired renal function 10/09/2007   DM (diabetes mellitus) type II, controlled, with peripheral vascular disorder ([place]) 07/07/2007   Normocytic anemia 07/07/2007   Essential hypertension 07/07/2007   HX, PERSONAL, MALIGNANCY, UTERUS NEC 07/07/2007    Past Surgical History:  Procedure Laterality Date   ABDOMINAL ANGIOGRAM  11/22/2014   Procedure: ABDOMINAL ANGIOGRAM;  Surgeon: Lorretta Harp, MD;  Location: [facility] CATH LAB;  Service: Cardiovascular;;   ABDOMINAL AORTOGRAM W/LOWER EXTREMITY N/A  10/03/2020   Procedure: ABDOMINAL AORTOGRAM W/LOWER EXTREMITY;  Surgeon: Lorretta Harp, MD;  Location: [facility] CV LAB;  Service: Cardiovascular;  Laterality: N/A;   COLONOSCOPY  2000   negative   CORONARY ANGIOPLASTY WITH STENT PLACEMENT  1999   RCA; Dr.  Melvern Banker G4 P4   ESOPHAGOGASTRODUODENOSCOPY     ILIAC ARTERY STENT Right 11/22/2014   LOWER EXTREMITY ANGIOGRAM N/A 11/22/2014   Procedure: LOWER EXTREMITY ANGIOGRAM;  Surgeon: Lorretta Harp, MD; L-SFA 100%, 3v runoff, R-pCIA 95>>0% w/ 8 mm x 18 mm long  Balloon expandable stent; R-dCIA 67>>0% w/ 8 mm x 4 cm long Cordis Smart Nitinol self  expanding stent    LOWER EXTREMITY ANGIOGRAPHY Bilateral 12/08/2020   Procedure: Lower Extremity Angiography;  Surgeon: Lorretta Harp, MD;  Location: [facility] CV LAB;  Service: Cardiovascular;  Laterality: Bilateral;   PERIPHERAL VASCULAR ATHERECTOMY Left 12/08/2020   Procedure: PERIPHERAL VASCULAR ATHERECTOMY;  Surgeon: Lorretta Harp, MD;  Location: [facility] CV LAB;  Service: Cardiovascular;  Laterality: Left;  common iliac   PERIPHERAL VASCULAR INTERVENTION Left 12/08/2020   Procedure: PERIPHERAL VASCULAR INTERVENTION;  Surgeon: Lorretta Harp, MD;  Location: [facility] CV LAB;  Service: Cardiovascular;  Laterality: Left;  common iliac   RENAL ARTERY STENT Right May 2007; 08/2006   ; restenosis   TOTAL ABDOMINAL HYSTERECTOMY  ?3295'J    for uterine CA   VISCERAL ARTERY INTERVENTION N/A 10/03/2020   Procedure: VISCERAL ARTERY INTERVENTION;  Surgeon: Lorretta Harp, MD;  Location: [facility] CV LAB;  Service: Cardiovascular;  Laterality: N/A;  SMA     OB History   No obstetric history on file.     Family History  Problem Relation Age of Onset   Heart failure Mother        after hip fx.    Hypertension Mother    Diabetes Mother    Deep vein thrombosis Mother    Hypertension Father    Diabetes Sister        insulin dependent   Hypertension Brother    Emphysema Brother    Diabetes Brother        insulin dependent   Diabetes Maternal Aunt        non-insulent dependent   Heart attack Paternal Aunt    Diabetes Maternal Grandmother        non-insulin dependent   Breast cancer Daughter     Social History   Tobacco Use    Smoking status: Every Day    Packs/day: 0.25    Years: 51.00    Pack years: 12.75    Types: Cigarettes   Smokeless tobacco: Never   Tobacco comments:    currently smoking 2-3 cigs per day-06/08/21  Vaping Use   Vaping Use: Never used  Substance Use Topics   Alcohol use: No    Alcohol/week: 0.0 standard drinks   Drug use: No    Home Medications Prior to Admission medications   Medication Sig Start Date End Date Taking? Authorizing Provider  amoxicillin-clavulanate (AUGMENTIN) 875-125 MG tablet Take 1 tablet by mouth every 12 (twelve) hours for 7 days. 08/04/21 08/11/21 Yes Marcello Fennel, PA-C  doxycycline (VIBRAMYCIN) 100 MG capsule Take 1 capsule (100 mg total) by mouth 2 (two) times daily for 7 days. 08/04/21 08/11/21 Yes Marcello Fennel, PA-C  ACCU-CHEK SOFTCLIX LANCETS lancets Check blood sugar once daily. Dx:E11.9 04/01/15   Ann Held, DO  aspirin EC 81 MG tablet Take 1 tablet (  81 mg total) by mouth daily. 10/05/20   Cheryln Manly, NP  atorvastatin (LIPITOR) 80 MG tablet Take 1 tablet (80 mg total) by mouth daily. 03/31/21   Lorretta Harp, MD  Blood Glucose Monitoring Suppl (ONETOUCH VERIO) w/Device KIT Use daily to check blood sugar.   DX E11.9 01/27/19   Roma Schanz R, DO  Calcium 500-100 MG-UNIT CHEW Chew 1 tablet by mouth daily before breakfast. 01/21/20   Carollee Herter, Alferd Apa, DO  Cholecalciferol (VITAMIN D3) 50 MCG (2000 UT) capsule Take 1 capsule (2,000 Units total) by mouth daily. 01/21/20   Ann Held, DO  clopidogrel (PLAVIX) 75 MG tablet Take 1 tablet (75 mg total) by mouth daily. 04/05/21   Roma Schanz R, DO  cyanocobalamin (,VITAMIN B-12,) 1000 MCG/ML injection INJECT INTO THE MUSCLE EVERY 30 DAYS 02/20/21   Carollee Herter, Alferd Apa, DO  FEROSUL 325 (65 Fe) MG tablet TAKE 1 TABLET BY MOUTH DAILY 07/04/21   Roma Schanz R, DO  glucose blood [facility] VERIO) test strip USE AS DIRECTED DAILY 04/29/20   Carollee Herter, Alferd Apa, DO  guaiFENesin (MUCINEX) 600 MG 12 hr tablet Take 1 tablet (600 mg total) by mouth 2 (two) times daily. Patient taking differently: Take 600 mg by mouth 2 (two) times daily as needed. 06/08/21   Mannam, Hart Robinsons, MD  loratadine (CLARITIN) 10 MG tablet Take 1 tablet (10 mg total) by mouth daily. Patient taking differently: Take 10 mg by mouth daily as needed. 01/09/21   Ann Held, DO  metoprolol succinate (TOPROL-XL) 25 MG 24 hr tablet Take 1 tablet (25 mg total) by mouth daily. 07/05/21   Ann Held, DO  Multiple Vitamins-Minerals (A THRU Z SELECT 50+ ADVANCED) TABS TAKE 1 TABLET BY MOUTH DAILY Patient taking differently: Take 1 tablet by mouth daily. 05/17/16   Ann Held, DO  olmesartan (BENICAR) 20 MG tablet Take 1 tablet (20 mg total) by mouth daily. 04/05/21   Ann Held, DO  pantoprazole (PROTONIX) 40 MG tablet Take 1 tablet (40 mg total) by mouth daily before breakfast. 08/09/20   Gatha Mayer, MD  Pirfenidone (ESBRIET) 267 MG TABS Take 3 tablets (801 mg total) by mouth 2 (two) times daily. 06/13/21   Mannam, Hart Robinsons, MD  spironolactone (ALDACTONE) 25 MG tablet Take 1 tablet (25 mg total) by mouth daily. 04/05/21   Lowne Chase, Yvonne R, DO  SYRINGE-NEEDLE, DISP, 3 ML (BD INTEGRA SYRINGE) 25G X 1" 3 ML MISC USE TO INJECT B-12 ONCE A MONTH 12/12/20   Carollee Herter, Alferd Apa, DO  Tiotropium Bromide-Olodaterol (STIOLTO RESPIMAT) 2.5-2.5 MCG/ACT AERS Inhale 2 puffs into the lungs daily. 06/16/21 09/14/21  Marshell Garfinkel, MD    Allergies    Patient has no known allergies.  Review of Systems   Review of Systems  Constitutional:  Negative for chills and fever.  HENT:  Positive for congestion. Negative for sore throat.   Respiratory:  Positive for cough and shortness of breath.   Cardiovascular:  Negative for chest pain.  Gastrointestinal:  Negative for abdominal pain, nausea and vomiting.  Genitourinary:  Negative for enuresis.  Musculoskeletal:   Negative for back pain and myalgias.  Skin:  Negative for rash.  Neurological:  Negative for dizziness and headaches.  Hematological:  Does not bruise/bleed easily.   Physical Exam Updated Vital Signs BP (!) 124/53   Pulse 86   Temp 98.7 F (37.1 C) (Oral)  Resp (!) 25   Ht 5' 2"  (1.575 m)   Wt 54.8 kg   SpO2 100%   BMI 22.10 kg/m   Physical Exam Vitals and nursing note reviewed.  Constitutional:      General: She is not in acute distress.    Appearance: She is not ill-appearing.  HENT:     Head: Normocephalic and atraumatic.     Right Ear: Tympanic membrane, ear canal and external ear normal.     Left Ear: Tympanic membrane, ear canal and external ear normal.     Nose: Nose normal. No congestion.     Mouth/Throat:     Mouth: Mucous membranes are moist.     Pharynx: Oropharynx is clear.  Eyes:     Conjunctiva/sclera: Conjunctivae normal.  Cardiovascular:     Rate and Rhythm: Normal rate and regular rhythm.     Pulses: Normal pulses.     Heart sounds: No murmur heard.   No friction rub. No gallop.  Pulmonary:     Effort: No respiratory distress.     Breath sounds: No wheezing, rhonchi or rales.     Comments: No signs of respiratory distress present, nontachypneic, nonhypoxic, able to speak in full sentences, lung sounds had slight rhonchi present, intermittent bibasilar Rales, no wheezing or stridor present, slightly tight sounding. Abdominal:     Palpations: Abdomen is soft.     Tenderness: There is no abdominal tenderness. There is no right CVA tenderness or left CVA tenderness.  Musculoskeletal:     Right lower leg: No edema.     Left lower leg: No edema.  Skin:    General: Skin is warm and dry.  Neurological:     Mental Status: She is alert.  Psychiatric:        Mood and Affect: Mood normal.    ED Results / Procedures / Treatments   Labs (all labs ordered are listed, but only abnormal results are displayed) Labs Reviewed  BASIC METABOLIC PANEL -  Abnormal; Notable for the following components:      Result Value   Creatinine, Ser 1.21 (*)    GFR, Estimated 48 (*)    All other components within normal limits  CBC WITH DIFFERENTIAL/PLATELET - Abnormal; Notable for the following components:   RBC 3.78 (*)    Hemoglobin 11.1 (*)    HCT 33.3 (*)    All other components within normal limits  RESP PANEL BY RT-PCR (FLU A&B, COVID) [id number]    EKG EKG Interpretation  Date/Time:  Friday August 04 2021 15:13:46 EST Ventricular Rate:  86 PR Interval:  136 QRS Duration: 79 QT Interval:  348 QTC Calculation: 417 R Axis:   69 Text Interpretation: Sinus rhythm Minimal ST elevation, inferior leads since last tracing no significant change Confirmed by Malvin Johns [phone]) on 08/04/2021 3:49:48 PM  Radiology DG Chest Portable 1 View  Result Date: 08/04/2021 CLINICAL DATA:  Shortness of breath EXAM: PORTABLE CHEST 1 VIEW COMPARISON:  Chest x-ray dated Jan 09, 2021 FINDINGS: Cardiac and mediastinal contours are unchanged within normal limits. Bibasilar reticular opacities with slightly increased conspicuity compared to prior exam. No large pleural effusion or pneumothorax. IMPRESSION: Bibasilar reticular opacities with slightly increased conspicuity compared to prior exam, possibly related to differences in technique, although infection or aspiration superimposed on chronic interstitial lung disease cannot be excluded. Electronically Signed   By: Yetta Glassman M.D.   On: 08/04/2021 13:20    Procedures Procedures   Medications Ordered in ED Medications  albuterol (VENTOLIN HFA) 108 (90 Base) MCG/ACT inhaler 1 puff (has no administration in time range)  ipratropium-albuterol (DUONEB) 0.5-2.5 (3) MG/3ML nebulizer solution 3 mL (3 mLs Nebulization Given 08/04/21 1505)    ED Course  I have reviewed the triage vital signs and the nursing notes.  Pertinent labs & imaging results that were available during my care of the patient were  reviewed by me and considered in my medical decision making (see chart for details).    MDM Rules/Calculators/A&P                          Initial impression-presents with shortness of breath.  She is alert, no acute distress, vital signs are reassuring.  Likely COPD exacerbation, will provide her with a DuoNeb, obtain basic lab work-up, and reassess.  Work-up-CBC shows normocytic anemia hemoglobin 11.1 at baseline, BMP shows creatinine of 1.2 appears to be at baseline, respiratory panel unremarkable.  Chest x-ray shows bibasilar reticular opacities which has increased from prior exams, possible infection, aspiration, chronic interstitial lung disease.  EKG sinus without signs of ischemia.  Reassessment-patient's reassessed after DuoNeb treatment, states she is feeling much better, has no complaints this time, chest is not tight sounding, no evidence of pressure distress, patient agreed for discharge.   Rule out- I have low suspicion for ACS as history is atypical, patient has no cardiac history, EKG was sinus rhythm without signs of ischemia, troponins will be deferred as she has no chest pain.  Low suspicion for PE as patient denies pleuritic chest pain,  patient denies leg pain, no pedal edema noted on exam, vital signs reassuring, nontachypneic, nonhypoxic, presentation more consistent with infection/COPD exacerbation.  Low suspicion for AAA or aortic dissection as history is atypical, patient has low risk factors.  Low suspicion for systemic infection as patient is nontoxic-appearing, vital signs reassuring, no obvious source infection noted on exam.   Plan-  Shortness of breath-likely a COPD exacerbation but she is having increased sputum production, and opacities  concern for infection, will treat her for pneumonia.  Will defer on steroids as she has no wheezing on my exam.  will provide her with albuterol, continue with her inhalers follow-up with PCP for further evaluation.  Gave strict  return precautions.  Vital signs have remained stable, no indication for hospital admission.  Patient discussed with attending and they agreed with assessment and plan.  Patient given at home care as well strict return precautions.  Patient verbalized that they understood agreed to said plan.  Final Clinical Impression(s) / ED Diagnoses Final diagnoses:  Shortness of breath    Rx / DC Orders ED Discharge Orders          Ordered    amoxicillin-clavulanate (AUGMENTIN) 875-125 MG tablet  Every 12 hours        08/04/21 1633    doxycycline (VIBRAMYCIN) 100 MG capsule  2 times daily        08/04/21 1633             Aron Baba 08/04/21 1635    Malvin Johns, MD 08/04/21 2248

## 2021-08-04 NOTE — Discharge Instructions (Signed)
Imaging and exam concerning for  pneumonia.  Starting you on antibiotics please take as prescribed.   also given you albuterol inhaler please use 1 to 2 puffs every 4-6 hours needed for shortness of breath.  Continue with all home medications.  Follow with your PCP for further evaluation.  Come back to the emergency department if you develop chest pain, shortness of breath, severe abdominal pain, uncontrolled nausea, vomiting, diarrhea.

## 2021-08-04 NOTE — ED Notes (Signed)
Patient educated on spacer and inhaler. Good effort with both. All questions answered.

## 2021-08-04 NOTE — ED Notes (Signed)
Pt d/c home per MD order. Discharge summary reviewed, pt verbalizes understanding. D/C home with visitor, No s/s of acute distress noted at discharge,.

## 2021-08-04 NOTE — ED Triage Notes (Signed)
Sob x 3 weeks. Cough. Hx of COPD.

## 2021-08-22 ENCOUNTER — Other Ambulatory Visit: Payer: Self-pay | Admitting: Family Medicine

## 2021-08-22 DIAGNOSIS — I10 Essential (primary) hypertension: Secondary | ICD-10-CM

## 2021-08-26 ENCOUNTER — Other Ambulatory Visit: Payer: Self-pay | Admitting: Cardiovascular Disease

## 2021-08-26 ENCOUNTER — Other Ambulatory Visit: Payer: Self-pay | Admitting: Family Medicine

## 2021-08-26 DIAGNOSIS — E785 Hyperlipidemia, unspecified: Secondary | ICD-10-CM

## 2021-09-07 ENCOUNTER — Encounter: Payer: Self-pay | Admitting: Internal Medicine

## 2021-09-27 ENCOUNTER — Other Ambulatory Visit: Payer: Self-pay | Admitting: Family Medicine

## 2021-09-27 DIAGNOSIS — I10 Essential (primary) hypertension: Secondary | ICD-10-CM

## 2021-10-21 ENCOUNTER — Other Ambulatory Visit: Payer: Self-pay | Admitting: Internal Medicine

## 2021-10-21 ENCOUNTER — Other Ambulatory Visit: Payer: Self-pay | Admitting: Cardiovascular Disease

## 2021-10-23 ENCOUNTER — Other Ambulatory Visit: Payer: Self-pay | Admitting: Family Medicine

## 2021-10-23 DIAGNOSIS — I10 Essential (primary) hypertension: Secondary | ICD-10-CM

## 2021-11-06 ENCOUNTER — Other Ambulatory Visit: Payer: Self-pay | Admitting: Pulmonary Disease

## 2021-11-06 ENCOUNTER — Other Ambulatory Visit: Payer: Self-pay | Admitting: Family Medicine

## 2021-11-06 DIAGNOSIS — J84112 Idiopathic pulmonary fibrosis: Secondary | ICD-10-CM

## 2021-11-20 ENCOUNTER — Other Ambulatory Visit: Payer: Self-pay | Admitting: Family Medicine

## 2022-01-02 ENCOUNTER — Other Ambulatory Visit: Payer: Self-pay | Admitting: Pulmonary Disease

## 2022-01-02 DIAGNOSIS — J449 Chronic obstructive pulmonary disease, unspecified: Secondary | ICD-10-CM

## 2022-01-10 ENCOUNTER — Other Ambulatory Visit: Payer: Self-pay | Admitting: Family Medicine

## 2022-01-11 ENCOUNTER — Other Ambulatory Visit: Payer: Self-pay | Admitting: *Deleted

## 2022-01-11 DIAGNOSIS — M8580 Other specified disorders of bone density and structure, unspecified site: Secondary | ICD-10-CM

## 2022-01-11 MED ORDER — VITAMIN D3 50 MCG (2000 UT) PO CAPS: 2000.0000 [IU] | ORAL_CAPSULE | Freq: Every day | ORAL | 11 refills | Status: DC

## 2022-01-12 ENCOUNTER — Other Ambulatory Visit (HOSPITAL_BASED_OUTPATIENT_CLINIC_OR_DEPARTMENT_OTHER): Payer: Self-pay | Admitting: Family Medicine

## 2022-01-12 DIAGNOSIS — Z1231 Encounter for screening mammogram for malignant neoplasm of breast: Secondary | ICD-10-CM

## 2022-01-23 ENCOUNTER — Ambulatory Visit (INDEPENDENT_AMBULATORY_CARE_PROVIDER_SITE_OTHER): Payer: Medicare Other | Admitting: Family Medicine

## 2022-01-23 ENCOUNTER — Encounter: Payer: Self-pay | Admitting: Family Medicine

## 2022-01-23 VITALS — BP 128/60 | HR 77 | Temp 97.3°F | Resp 18 | Ht 62.0 in | Wt 106.2 lb

## 2022-01-23 DIAGNOSIS — E1165 Type 2 diabetes mellitus with hyperglycemia: Secondary | ICD-10-CM

## 2022-01-23 DIAGNOSIS — F17200 Nicotine dependence, unspecified, uncomplicated: Secondary | ICD-10-CM | POA: Diagnosis not present

## 2022-01-23 DIAGNOSIS — I739 Peripheral vascular disease, unspecified: Secondary | ICD-10-CM

## 2022-01-23 DIAGNOSIS — E1169 Type 2 diabetes mellitus with other specified complication: Secondary | ICD-10-CM | POA: Diagnosis not present

## 2022-01-23 DIAGNOSIS — J441 Chronic obstructive pulmonary disease with (acute) exacerbation: Secondary | ICD-10-CM | POA: Diagnosis not present

## 2022-01-23 DIAGNOSIS — Z0000 Encounter for general adult medical examination without abnormal findings: Secondary | ICD-10-CM | POA: Diagnosis not present

## 2022-01-23 DIAGNOSIS — Z7902 Long term (current) use of antithrombotics/antiplatelets: Secondary | ICD-10-CM | POA: Diagnosis not present

## 2022-01-23 DIAGNOSIS — E785 Hyperlipidemia, unspecified: Secondary | ICD-10-CM | POA: Diagnosis not present

## 2022-01-23 DIAGNOSIS — R634 Abnormal weight loss: Secondary | ICD-10-CM

## 2022-01-23 DIAGNOSIS — I10 Essential (primary) hypertension: Secondary | ICD-10-CM | POA: Diagnosis not present

## 2022-01-23 LAB — CBC WITH DIFFERENTIAL/PLATELET
Basophils Absolute: 0 10*3/uL (ref 0.0–0.1)
Basophils Relative: 0.5 % (ref 0.0–3.0)
Eosinophils Absolute: 0.1 10*3/uL (ref 0.0–0.7)
Eosinophils Relative: 3.7 % (ref 0.0–5.0)
HCT: 34.9 % — ABNORMAL LOW (ref 36.0–46.0)
Hemoglobin: 11.6 g/dL — ABNORMAL LOW (ref 12.0–15.0)
Lymphocytes Relative: 42.9 % (ref 12.0–46.0)
Lymphs Abs: 1.7 10*3/uL (ref 0.7–4.0)
MCHC: 33.1 g/dL (ref 30.0–36.0)
MCV: 89 fl (ref 78.0–100.0)
Monocytes Absolute: 0.4 10*3/uL (ref 0.1–1.0)
Monocytes Relative: 9.2 % (ref 3.0–12.0)
Neutro Abs: 1.7 10*3/uL (ref 1.4–7.7)
Neutrophils Relative %: 43.7 % (ref 43.0–77.0)
Platelets: 213 10*3/uL (ref 150.0–400.0)
RBC: 3.92 Mil/uL (ref 3.87–5.11)
RDW: 15.3 % (ref 11.5–15.5)
WBC: 3.9 10*3/uL — ABNORMAL LOW (ref 4.0–10.5)

## 2022-01-23 LAB — LIPID PANEL
Cholesterol: 150 mg/dL (ref 0–200)
HDL: 57.8 mg/dL (ref >39.00)
LDL Cholesterol: 75 mg/dL (ref 0–99)
NonHDL: 91.89
Total CHOL/HDL Ratio: 3
Triglycerides: 86 mg/dL (ref 0.0–149.0)
VLDL: 17.2 mg/dL (ref 0.0–40.0)

## 2022-01-23 LAB — COMPREHENSIVE METABOLIC PANEL
ALT: 12 U/L (ref 0–35)
AST: 16 U/L (ref 0–37)
Albumin: 4.1 g/dL (ref 3.5–5.2)
Alkaline Phosphatase: 67 U/L (ref 39–117)
BUN: 13 mg/dL (ref 6–23)
CO2: 27 mEq/L (ref 19–32)
Calcium: 9.3 mg/dL (ref 8.4–10.5)
Chloride: 105 mEq/L (ref 96–112)
Creatinine, Ser: 1.19 mg/dL (ref 0.40–1.20)
GFR: 45.57 mL/min — ABNORMAL LOW (ref >60.00)
Glucose, Bld: 92 mg/dL (ref 70–99)
Potassium: 4.5 mEq/L (ref 3.5–5.1)
Sodium: 140 mEq/L (ref 135–145)
Total Bilirubin: 0.4 mg/dL (ref 0.2–1.2)
Total Protein: 6.5 g/dL (ref 6.0–8.3)

## 2022-01-23 LAB — MICROALBUMIN / CREATININE URINE RATIO
Creatinine,U: 112.1 mg/dL
Microalb Creat Ratio: 0.6 mg/g (ref 0.0–30.0)
Microalb, Ur: <0.7 mg/dL (ref 0.0–1.9)

## 2022-01-23 LAB — HEMOGLOBIN A1C: Hgb A1c MFr Bld: 6.2 % (ref 4.6–6.5)

## 2022-01-23 NOTE — Assessment & Plan Note (Signed)
Encourage heart healthy diet such as MIND or DASH diet, increase exercise, avoid trans fats, simple carbohydrates and processed foods, consider a krill or fish or flaxseed oil cap daily.  °

## 2022-01-23 NOTE — Assessment & Plan Note (Signed)
Add ensure / glucerna 2-3 a day inbetween meals  ?

## 2022-01-23 NOTE — Progress Notes (Addendum)
? ?Subjective:  ? ?By signing my name below, I, Shehryar Baig, attest that this documentation has been prepared under the direction and in the presence of Dr. Roma Schanz, DO. 01/23/2022 ? ? ? Patient ID: Christine Meyer, female    DOB: 28-Jul-1951, 71 y.o.   MRN: [id number] ? ?Chief Complaint  ?Patient presents with  ? Annual Exam  ?  Pt states just having coffee this morning   ? ? ?HPI ?Patient is in today for a comprehensive physical exam.  ? ?She complains of occasional cold sensation in her feet. Her symptoms are worse at night when laying down in bed. She denies having any swelling in her feet. Otherwise she has no other problems with her feet. She last seen a podiatrist in December 2022 to trim her toenails.  ?She typically sees her cardiologist regularly but has not seen them this year.  ?She has not made a follow up visit with her GI specialist but is interested in setting up an appointment to discuss her weight loss.  ? ?She denies having any fever, new moles, congestion, sore throat, new muscle pain, new joint pain, chest pain, cough, SOB, wheezing, n/v/d, constipation, blood in stool, dysuria, frequency, hematuria, or headaches at this time. ?She reports her oldest daughter passed away this year from a heart attack and stroke at age 65. She continues smoking 3 cigarettes daily.  ?She does not have the bivalent Covid-19 booster vaccine.  ?She does not exercise regularly due to getting short of breath after exertion. Her cardiologist is aware and is managing her symptoms.  ?She reports she is eating less. She typically eats 1x daily. She has decreased appetite.  ?Wt Readings from Last 3 Encounters:  ?01/23/22 106 lb 3.2 oz (48.2 kg)  ?08/04/21 120 lb 13 oz (54.8 kg)  ?07/28/21 120 lb 12.8 oz (54.8 kg)  ? ?She is due for vision care.  ? ? ?Past Medical History:  ?Diagnosis Date  ? Anemia   ? NOS  ? Anginal pain ([place])   ? 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 CAD (coronary artery  disease)   ? Stent RCA 1999  ? Chronic kidney disease   ? COPD (chronic obstructive pulmonary disease) ([place])   ? Diabetes mellitus, type 2 ([place])   ? Diet controlled  ? Gastric polyps - hyperplastic 02/23/2018  ? Hyperlipidemia   ? Hypertension   ? OSA (obstructive sleep apnea) [name]   ? PVD (peripheral vascular disease) ([place])   ? Carotid stenosis, renal artery stenosis  ? Sleep apnea   ? dx'd but doesn't use mask   ? Uterine cancer ([place]) ?1980's  ? ? ?Past Surgical History:  ?Procedure Laterality Date  ? ABDOMINAL ANGIOGRAM  11/22/2014  ? Procedure: ABDOMINAL ANGIOGRAM;  Surgeon: Lorretta Harp, MD;  Location: [facility] CATH LAB;  Service: Cardiovascular;;  ? ABDOMINAL AORTOGRAM W/LOWER EXTREMITY N/A 10/03/2020  ? Procedure: ABDOMINAL AORTOGRAM W/LOWER EXTREMITY;  Surgeon: Lorretta Harp, MD;  Location: [facility] CV LAB;  Service: Cardiovascular;  Laterality: N/A;  ? COLONOSCOPY  2000  ? negative  ? CORONARY ANGIOPLASTY WITH STENT PLACEMENT  1999  ? RCA; Dr. Donita Brooks P4  ? ESOPHAGOGASTRODUODENOSCOPY    ? ILIAC ARTERY STENT Right 11/22/2014  ? LOWER EXTREMITY ANGIOGRAM N/A 11/22/2014  ? Procedure: LOWER EXTREMITY ANGIOGRAM;  Surgeon: Lorretta Harp, MD; L-SFA 100%, 3v runoff, R-pCIA 95>>0% w/ 8 mm x 18 mm long  Balloon expandable stent; R-dCIA 67>>0% w/ 8 mm x  4 cm long Cordis Smart Nitinol self  expanding stent   ? LOWER EXTREMITY ANGIOGRAPHY Bilateral 12/08/2020  ? Procedure: Lower Extremity Angiography;  Surgeon: Lorretta Harp, MD;  Location: [facility] LAB;  Service: Cardiovascular;  Laterality: Bilateral;  ? PERIPHERAL VASCULAR ATHERECTOMY Left 12/08/2020  ? Procedure: PERIPHERAL VASCULAR ATHERECTOMY;  Surgeon: Lorretta Harp, MD;  Location: [facility] LAB;  Service: Cardiovascular;  Laterality: Left;  common iliac  ? PERIPHERAL VASCULAR INTERVENTION Left 12/08/2020  ? Procedure: PERIPHERAL VASCULAR INTERVENTION;  Surgeon: Lorretta Harp, MD;  Location: [facility] LAB;  Service:  Cardiovascular;  Laterality: Left;  common iliac  ? RENAL ARTERY STENT Right May 2007; 08/2006  ? ; restenosis  ? TOTAL ABDOMINAL HYSTERECTOMY  ?1980's  ?  for uterine CA  ? VISCERAL ARTERY INTERVENTION N/A 10/03/2020  ? Procedure: VISCERAL ARTERY INTERVENTION;  Surgeon: Lorretta Harp, MD;  Location: [facility] LAB;  Service: Cardiovascular;  Laterality: N/A;  SMA  ? ? ?Family History  ?Problem Relation Age of Onset  ? Heart failure Mother   ?     after hip fx.   ? Hypertension Mother   ? Diabetes Mother   ? Deep vein thrombosis Mother   ? Hypertension Father   ? Diabetes Sister   ?     insulin dependent  ? Hypertension Brother   ? Emphysema Brother   ? Diabetes Brother   ?     insulin dependent  ? Diabetes Maternal Grandmother   ?     non-insulin dependent  ? Breast cancer Daughter   ? Brain cancer Daughter   ? Diabetes Maternal Aunt   ?     non-insulent dependent  ? Heart attack Paternal Aunt   ? ? ?Social History  ? ?Socioeconomic History  ? Marital status: Single  ?  Spouse name: Not on file  ? Number of children: 4  ? Years of education: Not on file  ? Highest education level: Not on file  ?Occupational History  ? 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  ?  Employer: [employer]  ?Tobacco Use  ? Smoking status: Every Day  ?  Packs/day: 0.25  ?  Years: 51.00  ?  Pack years: 12.75  ?  Types: Cigarettes  ? Smokeless tobacco: Never  ? Tobacco comments:  ?  currently smoking 2-3 cigs per day-01/23/2022  ?Vaping Use  ? Vaping Use: Never used  ?Substance and Sexual Activity  ? Alcohol use: No  ?  Alcohol/week: 0.0 standard drinks  ? Drug use: No  ? Sexual activity: Not Currently  ?Other Topics Concern  ? Not on file  ?Social History Narrative  ?   ? Daughter [name] is CMA Financial controller primary care [place]  ? Retired Risk analyst  ? Smoker, no EtOH/drugs  ? ?Social Determinants of Health  ? ?Financial Resource Strain: Low Risk   ? Difficulty of Paying Living Expenses:  Not hard at all  ?Food Insecurity: No Food Insecurity  ? Worried About Charity fundraiser in the Last Year: Never true  ? Ran Out of Food in the Last Year: Never true  ?Transportation Needs: No Transportation Needs  ? Lack of Transportation (Medical): No  ? Lack of Transportation (Non-Medical): No  ?Physical Activity: Inactive  ? Days of Exercise per Week: 0 days  ? Minutes of Exercise per Session: 0 min  ?Stress: No Stress Concern Present  ? Feeling of  Stress : Not at all  ?Social Connections: Moderately Isolated  ? Frequency of Communication with Friends and Family: More than three times a week  ? Frequency of Social Gatherings with Friends and Family: More than three times a week  ? Attends Religious Services: More than 4 times per year  ? Active Member of Clubs or Organizations: No  ? Attends Archivist Meetings: Never  ? Marital Status: Never married  ?Intimate Partner Violence: Not At Risk  ? Fear of Current or Ex-Partner: No  ? Emotionally Abused: No  ? Physically Abused: No  ? Sexually Abused: No  ? ? ?Outpatient Medications Prior to Visit  ?Medication Sig Dispense Refill  ? ACCU-CHEK SOFTCLIX LANCETS lancets Check blood sugar once daily. Dx:E11.9 100 each 5  ? aspirin EC 81 MG tablet Take 1 tablet (81 mg total) by mouth daily. 90 tablet 1  ? atorvastatin (LIPITOR) 80 MG tablet TAKE 1 TABLET BY MOUTH  DAILY 90 tablet 3  ? BD INTEGRA SYRINGE 25G X 1" 3 ML MISC USE TO INJECT B-12 ONCE A MONTH. 12 each 1  ? Blood Glucose Monitoring Suppl (ONETOUCH VERIO) w/Device KIT Use daily to check blood sugar.   DX E11.9 1 kit 0  ? Calcium 500-100 MG-UNIT CHEW Chew 1 tablet by mouth daily before breakfast. 150 tablet 1  ? Cholecalciferol (VITAMIN D3) 50 MCG (2000 UT) capsule Take 1 capsule (2,000 Units total) by mouth daily. 60 capsule 11  ? clopidogrel (PLAVIX) 75 MG tablet TAKE 1 TABLET(75 MG) BY MOUTH DAILY 90 tablet 1  ? cyanocobalamin (,VITAMIN B-12,) 1000 MCG/ML injection INJECT INTO THE MUSCLE EVERY 30  DAYS 30 mL 3  ? ferrous sulfate (FEROSUL) 325 (65 FE) MG tablet TAKE 1 TABLET BY MOUTH DAILY 30 tablet 0  ? glucose blood (ONETOUCH VERIO) test strip USE AS DIRECTED DAILY 100 strip 12  ? guaiFENesin (MUCINEX) 600

## 2022-01-23 NOTE — Patient Instructions (Signed)
Preventive Care 7 Years and Older, Female ?Preventive care refers to lifestyle choices and visits with your health care provider that can promote health and wellness. Preventive care visits are also called wellness exams. ?What can I expect for my preventive care visit? ?Counseling ?Your health care provider may ask you questions about your: ?Medical history, including: ?Past medical problems. ?Family medical history. ?Pregnancy and menstrual history. ?History of falls. ?Current health, including: ?Memory and ability to understand (cognition). ?Emotional well-being. ?Home life and relationship well-being. ?Sexual activity and sexual health. ?Lifestyle, including: ?Alcohol, nicotine or tobacco, and drug use. ?Access to firearms. ?Diet, exercise, and sleep habits. ?Work and work Statistician. ?Sunscreen use. ?Safety issues such as seatbelt and bike helmet use. ?Physical exam ?Your health care provider will check your: ?Height and weight. These may be used to calculate your BMI (body mass index). BMI is a measurement that tells if you are at a healthy weight. ?Waist circumference. This measures the distance around your waistline. This measurement also tells if you are at a healthy weight and may help predict your risk of certain diseases, such as type 2 diabetes and high blood pressure. ?Heart rate and blood pressure. ?Body temperature. ?Skin for abnormal spots. ?What immunizations do I need? ? ?Vaccines are usually given at various ages, according to a schedule. Your health care provider will recommend vaccines for you based on your age, medical history, and lifestyle or other factors, such as travel or where you work. ?What tests do I need? ?Screening ?Your health care provider may recommend screening tests for certain conditions. This may include: ?Lipid and cholesterol levels. ?Hepatitis C test. ?Hepatitis B test. ?HIV (human immunodeficiency virus) test. ?STI (sexually transmitted infection) testing, if you are at  risk. ?Lung cancer screening. ?Colorectal cancer screening. ?Diabetes screening. This is done by checking your blood sugar (glucose) after you have not eaten for a while (fasting). ?Mammogram. Talk with your health care provider about how often you should have regular mammograms. ?BRCA-related cancer screening. This may be done if you have a family history of breast, ovarian, tubal, or peritoneal cancers. ?Bone density scan. This is done to screen for osteoporosis. ?Talk with your health care provider about your test results, treatment options, and if necessary, the need for more tests. ?Follow these instructions at home: ?Eating and drinking ? ?Eat a diet that includes fresh fruits and vegetables, whole grains, lean protein, and low-fat dairy products. Limit your intake of foods with high amounts of sugar, saturated fats, and salt. ?Take vitamin and mineral supplements as recommended by your health care provider. ?Do not drink alcohol if your health care provider tells you not to drink. ?If you drink alcohol: ?Limit how much you have to 0-1 drink a day. ?Know how much alcohol is in your drink. In the [place] one drink equals one 12 oz bottle of beer (355 mL), one 5 oz glass of wine (148 mL), or one 1? oz glass of hard liquor (44 mL). ?Lifestyle ?Brush your teeth every morning and night with fluoride toothpaste. Floss one time each day. ?Exercise for at least 30 minutes 5 or more days each week. ?Do not use any products that contain nicotine or tobacco. These products include cigarettes, chewing tobacco, and vaping devices, such as e-cigarettes. If you need help quitting, ask your health care provider. ?Do not use drugs. ?If you are sexually active, practice safe sex. Use a condom or other form of protection in order to prevent STIs. ?Take aspirin only as told by  your health care provider. Make sure that you understand how much to take and what form to take. Work with your health care provider to find out whether it  is safe and beneficial for you to take aspirin daily. ?Ask your health care provider if you need to take a cholesterol-lowering medicine (statin). ?Find healthy ways to manage stress, such as: ?Meditation, yoga, or listening to music. ?Journaling. ?Talking to a trusted person. ?Spending time with friends and family. ?Minimize exposure to UV radiation to reduce your risk of skin cancer. ?Safety ?Always wear your seat belt while driving or riding in a vehicle. ?Do not drive: ?If you have been drinking alcohol. Do not ride with someone who has been drinking. ?When you are tired or distracted. ?While texting. ?If you have been using any mind-altering substances or drugs. ?Wear a helmet and other protective equipment during sports activities. ?If you have firearms in your house, make sure you follow all gun safety procedures. ?What's next? ?Visit your health care provider once a year for an annual wellness visit. ?Ask your health care provider how often you should have your eyes and teeth checked. ?Stay up to date on all vaccines. ?This information is not intended to replace advice given to you by your health care provider. Make sure you discuss any questions you have with your health care provider. ?Document Revised: 02/22/2021 Document Reviewed: 02/22/2021 ?Elsevier Patient Education ? [place] of Quitting Smoking ?Quitting smoking is a physical and mental challenge. You may have cravings, withdrawal symptoms, and temptation to smoke. Before quitting, work with your health care provider to make a plan that can help you manage quitting. Making a plan before you quit may keep you from smoking when you have the urge to smoke while trying to quit. ?How to manage lifestyle changes ?Managing stress ?Stress can make you want to smoke, and wanting to smoke may cause stress. It is important to find ways to manage your stress. You could try some of the following: ?Practice relaxation  techniques. ?Breathe slowly and deeply, in through your nose and out through your mouth. ?Listen to music. ?Soak in a bath or take a shower. ?Imagine a peaceful place or vacation. ?Get some support. ?Talk with family or friends about your stress. ?Join a support group. ?Talk with a counselor or therapist. ?Get some physical activity. ?Go for a walk, run, or bike ride. ?Play a favorite sport. ?Practice yoga. ? ?Medicines ?Talk with your health care provider about medicines that might help you deal with cravings and make quitting easier for you. ?Relationships ?Social situations can be difficult when you are quitting smoking. To manage this, you can: ?Avoid parties and other social situations where people might be smoking. ?Avoid alcohol. ?Leave right away if you have the urge to smoke. ?Explain to your family and friends that you are quitting smoking. Ask for support and let them know you might be a bit grumpy. ?Plan activities where smoking is not an option. ?General instructions ?Be aware that many people gain weight after they quit smoking. However, not everyone does. To keep from gaining weight, have a plan in place before you quit, and stick to the plan after you quit. Your plan should include: ?Eating healthy snacks. When you have a craving, it may help to: ?Eat popcorn, or try carrots, celery, or other cut vegetables. ?Chew sugar-free gum. ?Changing how you eat. ?Eat small portion sizes at meals. ?Eat 4-6 small meals throughout the day instead of  1-2 large meals a day. ?Be mindful when you eat. You should avoid watching television or doing other things that might distract you as you eat. ?Exercising regularly. ?Make time to exercise each day. If you do not have time for a long workout, do short bouts of exercise for 5-10 minutes several times a day. ?Do some form of strengthening exercise, such as weight lifting. ?Do some exercise that gets your heart beating and causes you to breathe deeply, such as walking  fast, running, swimming, or biking. This is very important. ?Drinking plenty of water or other low-calorie or no-calorie drinks. Drink enough fluid to keep your urine pale yellow. ? ?How to recognize withdrawal s

## 2022-01-23 NOTE — Assessment & Plan Note (Signed)
Well controlled, no changes to meds. Encouraged heart healthy diet such as the DASH diet and exercise as tolerated.  °

## 2022-01-23 NOTE — Assessment & Plan Note (Signed)
ghm utd Check labs see avs  

## 2022-01-23 NOTE — Assessment & Plan Note (Signed)
Per pulmonary 

## 2022-01-23 NOTE — Assessment & Plan Note (Signed)
Cont' plavix ?

## 2022-01-23 NOTE — Assessment & Plan Note (Signed)
Pt will con't to work on cutting down  ?

## 2022-02-09 ENCOUNTER — Other Ambulatory Visit: Payer: Self-pay | Admitting: Family Medicine

## 2022-02-14 ENCOUNTER — Ambulatory Visit (HOSPITAL_BASED_OUTPATIENT_CLINIC_OR_DEPARTMENT_OTHER): Payer: Medicare Other

## 2022-02-20 ENCOUNTER — Ambulatory Visit (HOSPITAL_BASED_OUTPATIENT_CLINIC_OR_DEPARTMENT_OTHER)
Admission: RE | Admit: 2022-02-20 | Discharge: 2022-02-20 | Disposition: A | Discharge status: Home / Self Care | Payer: Medicare Other | Source: Ambulatory Visit | Attending: Family Medicine | Admitting: Family Medicine

## 2022-02-20 ENCOUNTER — Encounter (HOSPITAL_BASED_OUTPATIENT_CLINIC_OR_DEPARTMENT_OTHER): Payer: Self-pay

## 2022-02-20 DIAGNOSIS — Z1231 Encounter for screening mammogram for malignant neoplasm of breast: Secondary | ICD-10-CM | POA: Diagnosis not present

## 2022-02-24 ENCOUNTER — Other Ambulatory Visit: Payer: Self-pay | Admitting: Family Medicine

## 2022-02-24 DIAGNOSIS — I10 Essential (primary) hypertension: Secondary | ICD-10-CM

## 2022-03-01 ENCOUNTER — Telehealth: Payer: Self-pay

## 2022-03-01 ENCOUNTER — Ambulatory Visit (INDEPENDENT_AMBULATORY_CARE_PROVIDER_SITE_OTHER): Payer: Medicare Other | Admitting: Internal Medicine

## 2022-03-01 ENCOUNTER — Encounter: Payer: Self-pay | Admitting: Internal Medicine

## 2022-03-01 VITALS — BP 120/50 | HR 77 | Ht 62.0 in | Wt 106.0 lb

## 2022-03-01 DIAGNOSIS — K2940 Chronic atrophic gastritis without bleeding: Secondary | ICD-10-CM

## 2022-03-01 DIAGNOSIS — R6881 Early satiety: Secondary | ICD-10-CM | POA: Diagnosis not present

## 2022-03-01 DIAGNOSIS — K317 Polyp of stomach and duodenum: Secondary | ICD-10-CM | POA: Diagnosis not present

## 2022-03-01 DIAGNOSIS — Z7902 Long term (current) use of antithrombotics/antiplatelets: Secondary | ICD-10-CM | POA: Diagnosis not present

## 2022-03-01 DIAGNOSIS — R630 Anorexia: Secondary | ICD-10-CM

## 2022-03-01 DIAGNOSIS — R634 Abnormal weight loss: Secondary | ICD-10-CM | POA: Diagnosis not present

## 2022-03-01 NOTE — Patient Instructions (Signed)
You have been scheduled for an endoscopy. Please follow written instructions given to you at your visit today. If you use inhalers (even only as needed), please bring them with you on the day of your procedure.  You will be contacted by our office prior to your procedure for directions on holding your Plavix.  If you do not hear from our office 1 week prior to your scheduled procedure, please call [phone] to discuss. Ask for PJ, CMA   I appreciate the opportunity to care for you. Silvano Rusk, MD, [facility]

## 2022-03-01 NOTE — Telephone Encounter (Signed)
 [facility] Pre-operative Risk Assessment     Request for surgical clearance:     Endoscopy Procedure  What type of surgery is being performed?     EGD  When is this surgery scheduled?     04/09/2022  What type of clearance is required ?   Pharmacy  Are there any medications that need to be held prior to surgery and how long? Plavix, 5 days  Practice name and name of physician performing surgery?      [facility]  What is your office phone and fax number?      Phone- [phone]  [phone]  Anesthesia type (None, local, MAC, general) ?       MAC

## 2022-03-01 NOTE — Telephone Encounter (Signed)
   Name: Christine Meyer  DOB: 17-Sep-1950  MRN: [id number]  Primary Cardiologist: Quay Burow, MD  Chart reviewed as part of pre-operative protocol coverage. Because of [name]'s past medical history and time since last visit, she will require a follow-up tele-visit in order to better assess preoperative cardiovascular risk.  Pre-op covering staff: - Please schedule appointment and call patient to inform them. If patient already had an upcoming appointment within acceptable timeframe, please add "pre-op clearance" to the appointment notes so provider is aware. - Please contact requesting surgeon's office via preferred method (i.e, phone, fax) to inform them of need for appointment prior to surgery.  Per MD okay to hold Plavix for upcoming procedure.  Elgie Collard, PA-C  03/01/2022, 4:38 PM

## 2022-03-02 ENCOUNTER — Telehealth: Payer: Self-pay | Admitting: *Deleted

## 2022-03-02 NOTE — Telephone Encounter (Signed)
Pt agreeable to plan of care for tele pre op appt 03/19/22 @ 1 pm. Med rec and consent are done.

## 2022-03-08 ENCOUNTER — Other Ambulatory Visit: Payer: Self-pay | Admitting: *Deleted

## 2022-03-08 ENCOUNTER — Encounter: Payer: Medicare Other | Admitting: Internal Medicine

## 2022-03-08 MED ORDER — CYANOCOBALAMIN 1000 MCG/ML IJ SOLN: INTRAMUSCULAR | 3 refills | Status: DC

## 2022-03-08 MED ORDER — "BD INTEGRA SYRINGE 25G X 1"" 3 ML MISC": 1 refills | Status: DC

## 2022-03-19 ENCOUNTER — Ambulatory Visit (INDEPENDENT_AMBULATORY_CARE_PROVIDER_SITE_OTHER): Payer: Medicare Other | Admitting: Nurse Practitioner

## 2022-03-19 ENCOUNTER — Encounter: Payer: Self-pay | Admitting: Nurse Practitioner

## 2022-03-19 DIAGNOSIS — Z01810 Encounter for preprocedural cardiovascular examination: Secondary | ICD-10-CM | POA: Diagnosis not present

## 2022-03-19 NOTE — Progress Notes (Signed)
Virtual Visit via Telephone Note   Because of Christine Meyer's co-morbid illnesses, she is at least at moderate risk for complications without adequate follow up.  This format is felt to be most appropriate for this patient at this time.  The patient did not have access to video technology/had technical difficulties with video requiring transitioning to audio format only (telephone).  All issues noted in this document were discussed and addressed.  No physical exam could be performed with this format.  Please refer to the patient's chart for her consent to telehealth for [facility].  Evaluation Performed:  Preoperative cardiovascular risk assessment _____________   Date:  03/19/2022   Patient ID:  Christine Meyer, DOB 09/14/1950, MRN [id number] Patient Location:  Home Provider location:   Office  Primary Care Provider:  Ann Held, DO Primary Cardiologist:  Christine Burow, MD  Chief Complaint / Patient Profile   71 y.o. y/o female with a h/o hypertension, tobacco abuse, bilateral carotid artery stenosis, CAD s/p MI 1999 with stent to RCA, dyslipidemia, left common iliac artery stenosis, known occlusion of SFAs, right renal artery stenting, right external iliac artery stenting, mesenteric ischemia who is pending EGD and presents today for telephonic preoperative cardiovascular risk assessment.  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   CAD (coronary artery disease)    Stent RCA 1999   Chronic kidney disease    COPD (chronic obstructive pulmonary disease) (HCC)    Diabetes mellitus, type 2 ([place])    Diet controlled   Gastric polyps - hyperplastic 02/23/2018   Hyperlipidemia    Hypertension    OSA (obstructive sleep apnea) [name]    PVD (peripheral vascular disease) (HCC)    Carotid stenosis, renal artery stenosis   Sleep apnea    dx'd but  doesn't use mask    Uterine cancer ([place]) ?2683'M   Past Surgical History:  Procedure Laterality Date   ABDOMINAL ANGIOGRAM  11/22/2014   Procedure: ABDOMINAL ANGIOGRAM;  Surgeon: Lorretta Harp, MD;  Location: [facility] CATH LAB;  Service: Cardiovascular;;   ABDOMINAL AORTOGRAM W/LOWER EXTREMITY N/A 10/03/2020   Procedure: ABDOMINAL AORTOGRAM W/LOWER EXTREMITY;  Surgeon: Lorretta Harp, MD;  Location: [place] CV LAB;  Service: Cardiovascular;  Laterality: N/A;   COLONOSCOPY  2000   negative   CORONARY ANGIOPLASTY WITH STENT PLACEMENT  1999   RCA; Dr. Melvern Banker G4 P4   ESOPHAGOGASTRODUODENOSCOPY     ILIAC ARTERY STENT Right 11/22/2014   LOWER EXTREMITY ANGIOGRAM N/A 11/22/2014   Procedure: LOWER EXTREMITY ANGIOGRAM;  Surgeon: Lorretta Harp, MD; L-SFA 100%, 3v runoff, R-pCIA 95>>0% w/ 8 mm x 18 mm long  Balloon expandable stent; R-dCIA 67>>0% w/ 8 mm x 4 cm long Cordis Smart Nitinol self  expanding stent    LOWER EXTREMITY ANGIOGRAPHY Bilateral 12/08/2020   Procedure: Lower Extremity Angiography;  Surgeon: Lorretta Harp, MD;  Location: [place] CV LAB;  Service: Cardiovascular;  Laterality: Bilateral;   PERIPHERAL VASCULAR ATHERECTOMY Left 12/08/2020   Procedure: PERIPHERAL VASCULAR ATHERECTOMY;  Surgeon: Lorretta Harp, MD;  Location: [place] CV LAB;  Service: Cardiovascular;  Laterality: Left;  common iliac   PERIPHERAL VASCULAR INTERVENTION Left 12/08/2020   Procedure: PERIPHERAL VASCULAR INTERVENTION;  Surgeon: Lorretta Harp, MD;  Location: [place] CV LAB;  Service: Cardiovascular;  Laterality: Left;  common  iliac   RENAL ARTERY STENT Right May 2007; 08/2006   ; restenosis   TOTAL ABDOMINAL HYSTERECTOMY  ?5277'O    for uterine CA   VISCERAL ARTERY INTERVENTION N/A 10/03/2020   Procedure: VISCERAL ARTERY INTERVENTION;  Surgeon: Lorretta Harp, MD;  Location: [place] CV LAB;  Service: Cardiovascular;  Laterality: N/A;  SMA    Allergies  No Known  Allergies  History of Present Illness    Christine Meyer is a 71 y.o. female who presents via audio/video conferencing for a telehealth visit today.  Pt was last seen in cardiology clinic on 07/28/21 by Dr. Gwenlyn Found.  At that time Christine Meyer was doing well .  The patient is now pending procedure as outlined above. Since her last visit, she denies chest pain, lower extremity edema, fatigue, palpitations, melena, hematuria, hemoptysis, diaphoresis, weakness, presyncope, syncope, orthopnea, and PND. Reports shortness of breath   Home Medications    Prior to Admission medications   Medication Sig Start Date End Date Taking? Authorizing Provider  ACCU-CHEK SOFTCLIX LANCETS lancets Check blood sugar once daily. Dx:E11.9 04/01/15   Christine Held, DO  aspirin EC 81 MG tablet Take 1 tablet (81 mg total) by mouth daily. 10/05/20   Cheryln Manly, NP  atorvastatin (LIPITOR) 80 MG tablet TAKE 1 TABLET BY MOUTH  DAILY 08/28/21   Lorretta Harp, MD  Blood Glucose Monitoring Suppl (ONETOUCH VERIO) w/Device KIT Use daily to check blood sugar.   DX E11.9 01/27/19   Roma Schanz R, DO  Calcium 500-100 MG-UNIT CHEW Chew 1 tablet by mouth daily before breakfast. 01/21/20   Carollee Herter, Alferd Apa, DO  Cholecalciferol (VITAMIN D3) 50 MCG (2000 UT) capsule Take 1 capsule (2,000 Units total) by mouth daily. 01/11/22   Roma Schanz R, DO  clopidogrel (PLAVIX) 75 MG tablet TAKE 1 TABLET(75 MG) BY MOUTH DAILY 10/23/21   Lorretta Harp, MD  cyanocobalamin (,VITAMIN B-12,) 1000 MCG/ML injection INJECT INTO THE MUSCLE EVERY 30 DAYS 03/08/22   Carollee Herter, Alferd Apa, DO  FEROSUL 325 (65 Fe) MG tablet TAKE 1 TABLET BY MOUTH DAILY 02/09/22   Roma Schanz R, DO  glucose blood [facility] VERIO) test strip USE AS DIRECTED DAILY 04/29/20   Carollee Herter, Alferd Apa, DO  guaiFENesin (MUCINEX) 600 MG 12 hr tablet Take 1 tablet (600 mg total) by mouth 2 (two) times daily. Patient taking differently: Take 600 mg  by mouth 2 (two) times daily as needed. 06/08/21   Mannam, Hart Robinsons, MD  loratadine (CLARITIN) 10 MG tablet Take 1 tablet (10 mg total) by mouth daily. Patient taking differently: Take 10 mg by mouth daily as needed. 01/09/21   Roma Schanz R, DO  metoprolol succinate (TOPROL-XL) 25 MG 24 hr tablet TAKE 1 TABLET BY MOUTH DAILY 02/26/22   Carollee Herter, Alferd Apa, DO  Multiple Vitamins-Minerals (A THRU Z SELECT 50+ ADVANCED) TABS TAKE 1 TABLET BY MOUTH DAILY Patient taking differently: Take 1 tablet by mouth daily. 05/17/16   Carollee Herter, Kendrick Fries R, DO  olmesartan (BENICAR) 20 MG tablet TAKE 1 TABLET(20 MG) BY MOUTH DAILY 10/23/21   Carollee Herter, Yvonne R, DO  pantoprazole (PROTONIX) 40 MG tablet TAKE 1 TABLET(40 MG) BY MOUTH DAILY BEFORE AND BREAKFAST 10/23/21   Gatha Mayer, MD  Pirfenidone 267 MG TABS TAKE 3 TABLETS (801MG) BY  MOUTH TWICE DAILY WITH FOOD 11/07/21   Mannam, Hart Robinsons, MD  spironolactone (ALDACTONE) 25 MG tablet TAKE 1 TABLET(25 MG)  BY MOUTH DAILY 10/23/21   Lowne Chase, Yvonne R, DO  STIOLTO RESPIMAT 2.5-2.5 MCG/ACT AERS USE 2 INHALATIONS BY MOUTH  DAILY 01/03/22   Mannam, Praveen, MD  SYRINGE-NEEDLE, DISP, 3 ML (BD INTEGRA SYRINGE) 25G X 1" 3 ML MISC Use to inject b12 once a month. 03/08/22   Christine Held, DO    Physical Exam    Vital Signs:  Christine Meyer does not have vital signs available for review today.  Given telephonic nature of communication, physical exam is limited. AAOx3. NAD. Normal affect.  Speech and respirations are unlabored.  Accessory Clinical Findings    None  Assessment & Plan    1.  Preoperative Cardiovascular Risk Assessment: Patient is doing well from a cardiac perspective and may proceed to surgery without further testing. She may hold Plavix for 5 days prior to procedure. According to the Revised Cardiac Risk Index (RCRI), her Perioperative Risk of Major Cardiac Event is (%): 0.9. Her Functional Capacity in METs is: 5.62 according to the [facility]  Activity Status Index (DASI).   A copy of this note will be routed to requesting surgeon.  Time:   Today, I have spent 10 minutes with the patient with telehealth technology discussing medical history, symptoms, and management plan.    Emmaline Life, NP-C    03/19/2022, 1:03 PM [place] [address], [address] Office [phone] Fax [phone]

## 2022-03-20 NOTE — Telephone Encounter (Signed)
Patient informed to hold her generic plavix 5 days prior to procedure. She wrote down the name of it and verbalized understanding.

## 2022-04-01 ENCOUNTER — Encounter: Payer: Self-pay | Admitting: Certified Registered Nurse Anesthetist

## 2022-04-09 ENCOUNTER — Encounter: Payer: Self-pay | Admitting: Internal Medicine

## 2022-04-09 ENCOUNTER — Ambulatory Visit (AMBULATORY_SURGERY_CENTER): Payer: Medicare Other | Admitting: Internal Medicine

## 2022-04-09 VITALS — BP 118/61 | HR 78 | Temp 96.8°F | Resp 17 | Ht 62.0 in | Wt 106.0 lb

## 2022-04-09 DIAGNOSIS — K319 Disease of stomach and duodenum, unspecified: Secondary | ICD-10-CM

## 2022-04-09 DIAGNOSIS — R6881 Early satiety: Secondary | ICD-10-CM

## 2022-04-09 DIAGNOSIS — K2940 Chronic atrophic gastritis without bleeding: Secondary | ICD-10-CM | POA: Diagnosis not present

## 2022-04-09 DIAGNOSIS — K317 Polyp of stomach and duodenum: Secondary | ICD-10-CM | POA: Diagnosis not present

## 2022-04-09 DIAGNOSIS — R634 Abnormal weight loss: Secondary | ICD-10-CM

## 2022-04-09 DIAGNOSIS — I2510 Atherosclerotic heart disease of native coronary artery without angina pectoris: Secondary | ICD-10-CM | POA: Diagnosis not present

## 2022-04-09 MED ORDER — SODIUM CHLORIDE 0.9 % IV SOLN: 500.0000 mL | Freq: Once | INTRAVENOUS | Status: DC

## 2022-04-09 NOTE — Progress Notes (Signed)
[facility] History and Physical   Primary Care Physician:  Carollee Herter, Alferd Apa, DO   Reason for Procedure:    Encounter Diagnoses  Name Primary?   Atrophic gastritis without hemorrhage Yes   Loss of weight    Early satiety   Hx gastric polyps   Plan:    EGD     HPI: Christine Meyer is a 71 y.o.  African-American woman with peripheral artery disease, CAD, mesenteric ischemia, history of gastric ulcers and autoimmune gastritis as well as gastric polyps who presents with a several month history of anorexia and weight loss.  There is mild early satiety. Lost daughter to MI/stroke age 24 within the past several months but denies significant grief reaction.  Saw Dr. Etter Sjogren and had essentially normal labs as outlined below.  Started on Glucerna taking 1 or 2 a day.  No dysphagia or vomiting reported.  No diarrhea.  No constipation.  Daughter Jesusa Stenerson, CMA is being treated for breast cancer that is metastatic to the brain. 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   CAD (coronary artery disease)    Stent RCA 1999   Chronic kidney disease    COPD (chronic obstructive pulmonary disease) (HCC)    Diabetes mellitus, type 2 (HCC)    Diet controlled   Gastric polyps - hyperplastic 02/23/2018   Hyperlipidemia    Hypertension    OSA (obstructive sleep apnea) [name]    PVD (peripheral vascular disease) (HCC)     Carotid stenosis, renal artery stenosis   Sleep apnea    dx'd but doesn't use mask    Uterine cancer ([place]) ?8325'Q    Past Surgical History:  Procedure Laterality Date   ABDOMINAL ANGIOGRAM  11/22/2014   Procedure: ABDOMINAL ANGIOGRAM;  Surgeon: Lorretta Harp, MD;  [facility] CATH LAB;  Service: Cardiovascular;;   ABDOMINAL AORTOGRAM W/LOWER EXTREMITY N/A 10/03/2020   Procedure: ABDOMINAL AORTOGRAM W/LOWER EXTREMITY;  Surgeon: Lorretta Harp, MD;  Location: [place] CV LAB;  Service: Cardiovascular;  Laterality: N/A;   COLONOSCOPY  2000   negative   CORONARY ANGIOPLASTY WITH STENT PLACEMENT  1999   RCA; Dr. Melvern Banker G4 P4   ESOPHAGOGASTRODUODENOSCOPY     ILIAC ARTERY STENT Right 11/22/2014   LOWER EXTREMITY ANGIOGRAM N/A 11/22/2014   Procedure: LOWER EXTREMITY ANGIOGRAM;  Surgeon: Lorretta Harp, MD; L-SFA 100%, 3v runoff, R-pCIA 95>>0% w/ 8 mm x 18 mm long  Balloon expandable stent; R-dCIA 67>>0% w/ 8 mm x 4 cm long Cordis Smart Nitinol self  expanding stent    LOWER EXTREMITY ANGIOGRAPHY Bilateral 12/08/2020   Procedure: Lower Extremity Angiography;  Surgeon: Lorretta Harp, MD;  Location: [place] CV LAB;  Service: Cardiovascular;  Laterality: Bilateral;   PERIPHERAL VASCULAR ATHERECTOMY  Left 12/08/2020   Procedure: PERIPHERAL VASCULAR ATHERECTOMY;  Surgeon: Berry, Jonathan J, MD;  Location: [facility] INVASIVE CV LAB;  Service: Cardiovascular;  Laterality: Left;  common iliac   PERIPHERAL VASCULAR INTERVENTION Left 12/08/2020   Procedure: PERIPHERAL VASCULAR INTERVENTION;  Surgeon: Berry, Jonathan J, MD;  Location: [facility] INVASIVE CV LAB;  Service: Cardiovascular;  Laterality: Left;  common iliac   RENAL ARTERY STENT Right May 2007; 08/2006   ; restenosis   TOTAL ABDOMINAL HYSTERECTOMY  ?1980's    for uterine CA   VISCERAL ARTERY INTERVENTION N/A 10/03/2020   Procedure: VISCERAL ARTERY INTERVENTION;  Surgeon: Berry, Jonathan J, MD;  Location: [facility] INVASIVE CV LAB;  Service:  Cardiovascular;  Laterality: N/A;  SMA    Prior to Admission medications   Medication Sig Start Date End Date Taking? Authorizing Provider  ACCU-CHEK SOFTCLIX LANCETS lancets Check blood sugar once daily. Dx:E11.9 04/01/15  Yes Lowne Chase, Yvonne R, DO  aspirin EC 81 MG tablet Take 1 tablet (81 mg total) by mouth daily. 10/05/20  Yes Roberts, Lindsay B, NP  atorvastatin (LIPITOR) 80 MG tablet TAKE 1 TABLET BY MOUTH  DAILY 08/28/21  Yes Berry, Jonathan J, MD  Blood Glucose Monitoring Suppl (ONETOUCH VERIO) w/Device KIT Use daily to check blood sugar.   DX E11.9 01/27/19  Yes Lowne Chase, Yvonne R, DO  Calcium 500-100 MG-UNIT CHEW Chew 1 tablet by mouth daily before breakfast. 01/21/20  Yes Lowne Chase, Yvonne R, DO  Cholecalciferol (VITAMIN D3) 50 MCG (2000 UT) capsule Take 1 capsule (2,000 Units total) by mouth daily. 01/11/22  Yes Lowne Chase, Yvonne R, DO  cyanocobalamin (,VITAMIN B-12,) 1000 MCG/ML injection INJECT INTO THE MUSCLE EVERY 30 DAYS 03/08/22  Yes Lowne Chase, Yvonne R, DO  FEROSUL 325 (65 Fe) MG tablet TAKE 1 TABLET BY MOUTH DAILY 02/09/22  Yes Lowne Chase, Yvonne R, DO  glucose blood (ONETOUCH VERIO) test strip USE AS DIRECTED DAILY 04/29/20  Yes Lowne Chase, Yvonne R, DO  metoprolol succinate (TOPROL-XL) 25 MG 24 hr tablet TAKE 1 TABLET BY MOUTH DAILY 02/26/22  Yes Lowne Chase, Yvonne R, DO  Multiple Vitamins-Minerals (A THRU Z SELECT 50+ ADVANCED) TABS TAKE 1 TABLET BY MOUTH DAILY Patient taking differently: Take 1 tablet by mouth daily. 05/17/16  Yes Lowne Chase, Yvonne R, DO  olmesartan (BENICAR) 20 MG tablet TAKE 1 TABLET(20 MG) BY MOUTH DAILY 10/23/21  Yes Lowne Chase, Yvonne R, DO  pantoprazole (PROTONIX) 40 MG tablet TAKE 1 TABLET(40 MG) BY MOUTH DAILY BEFORE AND BREAKFAST 10/23/21  Yes Gessner, Carl E, MD  Pirfenidone 267 MG TABS TAKE 3 TABLETS (801MG) BY  MOUTH TWICE DAILY WITH FOOD 11/07/21  Yes Mannam, Praveen, MD  spironolactone (ALDACTONE) 25 MG tablet TAKE 1 TABLET(25 MG) BY MOUTH  DAILY 10/23/21  Yes Lowne Chase, Yvonne R, DO  clopidogrel (PLAVIX) 75 MG tablet TAKE 1 TABLET(75 MG) BY MOUTH DAILY 10/23/21   Berry, Jonathan J, MD  guaiFENesin (MUCINEX) 600 MG 12 hr tablet Take 1 tablet (600 mg total) by mouth 2 (two) times daily. Patient not taking: Reported on 04/09/2022 06/08/21   Mannam, Praveen, MD  loratadine (CLARITIN) 10 MG tablet Take 1 tablet (10 mg total) by mouth daily. Patient not taking: Reported on 04/09/2022 01/09/21   Lowne Chase, Yvonne R, DO  STIOLTO RESPIMAT 2.5-2.5 MCG/ACT AERS USE 2 INHALATIONS BY MOUTH  DAILY Patient not taking: Reported on 04/09/2022 01/03/22   Mannam, Praveen, MD  SYRINGE-NEEDLE, DISP, 3 ML (BD INTEGRA SYRINGE) 25G X 1" 3   ML MISC Use to inject b12 once a month. Patient not taking: Reported on 04/09/2022 03/08/22   Ann Held, DO    Current Outpatient Medications  Medication Sig Dispense Refill   ACCU-CHEK SOFTCLIX LANCETS lancets Check blood sugar once daily. Dx:E11.9 100 each 5   aspirin EC 81 MG tablet Take 1 tablet (81 mg total) by mouth daily. 90 tablet 1   atorvastatin (LIPITOR) 80 MG tablet TAKE 1 TABLET BY MOUTH  DAILY 90 tablet 3   Blood Glucose Monitoring Suppl (ONETOUCH VERIO) w/Device KIT Use daily to check blood sugar.   DX E11.9 1 kit 0   Calcium 500-100 MG-UNIT CHEW Chew 1 tablet by mouth daily before breakfast. 150 tablet 1   Cholecalciferol (VITAMIN D3) 50 MCG (2000 UT) capsule Take 1 capsule (2,000 Units total) by mouth daily. 60 capsule 11   cyanocobalamin (,VITAMIN B-12,) 1000 MCG/ML injection INJECT INTO THE MUSCLE EVERY 30 DAYS 30 mL 3   FEROSUL 325 (65 Fe) MG tablet TAKE 1 TABLET BY MOUTH DAILY 30 tablet 5   glucose blood (ONETOUCH VERIO) test strip USE AS DIRECTED DAILY 100 strip 12   metoprolol succinate (TOPROL-XL) 25 MG 24 hr tablet TAKE 1 TABLET BY MOUTH DAILY 90 tablet 3   Multiple Vitamins-Minerals (A THRU Z SELECT 50+ ADVANCED) TABS TAKE 1 TABLET BY MOUTH DAILY (Patient taking differently: Take 1  tablet by mouth daily.) 220 tablet 1   olmesartan (BENICAR) 20 MG tablet TAKE 1 TABLET(20 MG) BY MOUTH DAILY 90 tablet 0   pantoprazole (PROTONIX) 40 MG tablet TAKE 1 TABLET(40 MG) BY MOUTH DAILY BEFORE AND BREAKFAST 90 tablet 0   Pirfenidone 267 MG TABS TAKE 3 TABLETS (801MG) BY  MOUTH TWICE DAILY WITH FOOD 180 tablet 2   spironolactone (ALDACTONE) 25 MG tablet TAKE 1 TABLET(25 MG) BY MOUTH DAILY 90 tablet 0   clopidogrel (PLAVIX) 75 MG tablet TAKE 1 TABLET(75 MG) BY MOUTH DAILY 90 tablet 1   guaiFENesin (MUCINEX) 600 MG 12 hr tablet Take 1 tablet (600 mg total) by mouth 2 (two) times daily. (Patient not taking: Reported on 04/09/2022) 30 tablet 5   loratadine (CLARITIN) 10 MG tablet Take 1 tablet (10 mg total) by mouth daily. (Patient not taking: Reported on 04/09/2022) 30 tablet 11   STIOLTO RESPIMAT 2.5-2.5 MCG/ACT AERS USE 2 INHALATIONS BY MOUTH  DAILY (Patient not taking: Reported on 04/09/2022) 12 g 3   SYRINGE-NEEDLE, DISP, 3 ML (BD INTEGRA SYRINGE) 25G X 1" 3 ML MISC Use to inject b12 once a month. (Patient not taking: Reported on 04/09/2022) 12 each 1   Current Facility-Administered Medications  Medication Dose Route Frequency Provider Last Rate Last Admin   0.9 %  sodium chloride infusion  500 mL Intravenous Once Gatha Mayer, MD       sodium chloride flush (NS) 0.9 % injection 3 mL  3 mL Intravenous Q12H Lorretta Harp, MD        Allergies as of 04/09/2022   (No Known Allergies)    Family History  Problem Relation Age of Onset   Heart failure Mother        after hip fx.    Hypertension Mother    Diabetes Mother    Deep vein thrombosis Mother    Hypertension Father    Diabetes Sister        insulin dependent   Hypertension Brother    Emphysema Brother    Diabetes Brother  insulin dependent   Diabetes Maternal Grandmother        non-insulin dependent   Stroke Daughter    Heart attack Daughter    Brain cancer Daughter    Breast cancer Daughter    Diabetes  Maternal Aunt        non-insulent dependent   Heart attack Paternal Aunt     Social History   Socioeconomic History   Marital status: Single    Spouse name: Not on file   Number of children: 4   Years of education: Not on file   Highest education level: Not on file  Occupational History   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    Employer: [employer]  Tobacco Use   Smoking status: Every Day    Packs/day: 0.25    Years: 51.00    Total pack years: 12.75    Types: Cigarettes   Smokeless tobacco: Never   Tobacco comments:    currently smoking 2-3 cigs per day-01/23/2022  Vaping Use   Vaping Use: Never used  Substance and Sexual Activity   Alcohol use: No    Alcohol/week: 0.0 standard drinks of alcohol   Drug use: No   Sexual activity: Not Currently  Other Topics Concern   Not on file  Social History Narrative      Daughter Lucy is CMA [facility]   Retired cafeteria worker [employer]   Smoker, no EtOH/drugs   Social Determinants of Health   Financial Resource Strain: Low Risk  (07/20/2021)   Overall Financial Resource Strain (CARDIA)    Difficulty of Paying Living Expenses: Not hard at all  Food Insecurity: No Food Insecurity (07/20/2021)   Hunger Vital Sign    Worried About Running Out of Food in the Last Year: Never true    Ran Out of Food in the Last Year: Never true  Transportation Needs: No Transportation Needs (07/20/2021)   PRAPARE - Transportation    Lack of Transportation (Medical): No    Lack of Transportation (Non-Medical): No  Physical Activity: Inactive (07/20/2021)   Exercise Vital Sign    Days of Exercise per Week: 0 days    Minutes of Exercise per Session: 0 min  Stress: No Stress Concern Present (07/20/2021)   [employer] - Occupational Stress Questionnaire    Feeling of Stress : Not at all  Social Connections: Moderately Isolated (07/20/2021)   Social  Connection and Isolation Panel [NHANES]    Frequency of Communication with Friends and Family: More than three times a week    Frequency of Social Gatherings with Friends and Family: More than three times a week    Attends Religious Services: More than 4 times per year    Active Member of Clubs or Organizations: No    Attends Club or Organization Meetings: Never    Marital Status: Never married  Intimate Partner Violence: Not At Risk (07/20/2021)   Humiliation, Afraid, Rape, and Kick questionnaire    Fear of Current or Ex-Partner: No    Emotionally Abused: No    Physically Abused: No    Sexually Abused: No    Review of Systems:  All other review of systems negative except as mentioned in the HPI.  Physical Exam: Vital signs BP 133/76   Pulse 62   Temp (!) 96.8 F (36 C) (Skin)   Ht 5' 2" (1.575 m)   Wt 106 lb (48.1 kg)   SpO2 100%     BMI 19.39 kg/m   General:   Alert,  Well-developed, well-nourished, pleasant and cooperative in NAD Lungs:  Clear throughout to auscultation.   Heart:  Regular rate and rhythm; no murmurs, clicks, rubs,  or gallops. Abdomen:  Soft, nontender and nondistended. Normal bowel sounds.   Neuro/Psych:  Alert and cooperative. Normal mood and affect. A and O x 3   @Carl E. Gessner, MD, FACG [facility] [phone] (pager) 04/09/2022 9:51 AM@  

## 2022-04-09 NOTE — Progress Notes (Signed)
Report given to PACU, vss 

## 2022-04-09 NOTE — Progress Notes (Signed)
Called to room to assist during endoscopic procedure.  Patient ID and intended procedure confirmed with present staff. Received instructions for my participation in the procedure from the performing physician.  

## 2022-04-09 NOTE — Progress Notes (Signed)
Pt's states no medical or surgical changes since previsit or office visit. 

## 2022-04-09 NOTE — Op Note (Signed)
[place] Patient Name: Christine Meyer Procedure Date: 04/09/2022 10:28 AM MRN: [id number] Endoscopist: Gatha Mayer , MD Age: 71 Referring MD:  Date of Birth: 05/14/1951 Gender: Female Account #: [id number] Procedure:                Upper GI endoscopy Indications:              Atrophic gastritis, Anorexia, Early satiety, Weight                            loss Medicines:                Monitored Anesthesia Care Procedure:                Pre-Anesthesia Assessment:                           - Prior to the procedure, a History and Physical                            was performed, and patient medications and                            allergies were reviewed. The patient's tolerance of                            previous anesthesia was also reviewed. The risks                            and benefits of the procedure and the sedation                            options and risks were discussed with the patient.                            All questions were answered, and informed consent                            was obtained. Prior Anticoagulants: The patient                            last took Plavix (clopidogrel) 5 days prior to the                            procedure. ASA Grade Assessment: III - A patient                            with severe systemic disease. After reviewing the                            risks and benefits, the patient was deemed in                            satisfactory condition to undergo the procedure.  After obtaining informed consent, the endoscope was                            passed under direct vision. Throughout the                            procedure, the patient's blood pressure, pulse, and                            oxygen saturations were monitored continuously. The                            Endoscope was introduced through the mouth, and                            advanced to the second part of duodenum.  The upper                            GI endoscopy was accomplished without difficulty.                            The patient tolerated the procedure well. Scope In: Scope Out: Findings:                 The examined esophagus was normal.                           A few dispersed erosions with no stigmata of recent                            bleeding were found in the gastric body. Biopsies                            were taken with a cold forceps for histology.                            Verification of patient identification for the                            specimen was done. Estimated blood loss was minimal.                           Diffuse atrophic mucosa was found in the entire                            examined stomach. Biopsies were taken with a cold                            forceps for histology. Verification of patient                            identification for the specimen was done. Estimated  blood loss was minimal.                           Multiple diminutive sessile polyps were found in                            the gastric fundus and in the gastric body.                           The cardia and gastric fundus were otherwise normal                            on retroflexion.                           The examined duodenum was normal. Complications:            No immediate complications. Estimated Blood Loss:     Estimated blood loss was minimal. Impression:               - Normal esophagus.                           - Erosive gastropathy with no stigmata of recent                            bleeding. Biopsied. Few-several erosions surrounded                            by erythema                           - Gastric mucosal atrophy. Biopsied. Antral and                            body/fundus bottles (mapping)                           - Multiple gastric polyps. Tiny and known to be                            hyperplastic on prior  biopsies                           - Normal examined duodenum. Recommendation:           - Patient has a contact number available for                            emergencies. The signs and symptoms of potential                            delayed complications were discussed with the                            patient. Return to normal activities tomorrow.  Written discharge instructions were provided to the                            patient.                           - Resume previous diet.                           - Continue present medications.                           - Resume Plavix (clopidogrel) at prior dose                            tomorrow.                           - Await pathology results. Gatha Mayer, MD 04/09/2022 10:54:10 AM This report has been signed electronically.

## 2022-04-09 NOTE — Progress Notes (Signed)
1030 Robinul 0.1 mg IV given due large amount of secretions upon assessment.  MD made aware, vss 

## 2022-04-09 NOTE — Patient Instructions (Addendum)
I found some spots of inflammation and the known gastritis - all biopsied.  I also saw some tiny stomach polyps - we already know they are benign so no biopsies.  I will let you know results and any new recommendations.  Restart clopidogrel (Plavix) tomorrow, please.  I appreciate the opportunity to care for you. Gatha Mayer, MD, [facility]  Await pathology results from the biopsies taken today.  Resume Plavix at prior dose tomorrow.  Resume previous diet.   YOU HAD AN ENDOSCOPIC PROCEDURE TODAY AT [facility]:   Refer to the procedure report that was given to you for any specific questions about what was found during the examination.  If the procedure report does not answer your questions, please call your gastroenterologist to clarify.  If you requested that your care partner not be given the details of your procedure findings, then the procedure report has been included in a sealed envelope for you to review at your convenience later.  YOU SHOULD EXPECT: Some feelings of bloating in the abdomen. Passage of more gas than usual.  Walking can help get rid of the air that was put into your GI tract during the procedure and reduce the bloating. If you had a lower endoscopy (such as a colonoscopy or flexible sigmoidoscopy) you may notice spotting of blood in your stool or on the toilet paper. If you underwent a bowel prep for your procedure, you may not have a normal bowel movement for a few days.  Please Note:  You might notice some irritation and congestion in your nose or some drainage.  This is from the oxygen used during your procedure.  There is no need for concern and it should clear up in a day or so.  SYMPTOMS TO REPORT IMMEDIATELY:   Following upper endoscopy (EGD)  Vomiting of blood or coffee ground material  New chest pain or pain under the shoulder blades  Painful or persistently difficult swallowing  New shortness of breath  Fever of 100F or higher  Black,  tarry-looking stools  For urgent or emergent issues, a gastroenterologist can be reached at any hour by calling [phone]. Do not use MyChart messaging for urgent concerns.    DIET:  We do recommend a small meal at first, but then you may proceed to your regular diet.  Drink plenty of fluids but you should avoid alcoholic beverages for 24 hours.  ACTIVITY:  You should plan to take it easy for the rest of today and you should NOT DRIVE or use heavy machinery until tomorrow (because of the sedation medicines used during the test).    FOLLOW UP: Our staff will call the number listed on your records the next business day following your procedure.  We will call around 7:15- 8:00 am to check on you and address any questions or concerns that you may have regarding the information given to you following your procedure. If we do not reach you, we will leave a message.  If you develop any symptoms (ie: fever, flu-like symptoms, shortness of breath, cough etc.) before then, please call [phone].  If you test positive for Covid 19 in the 2 weeks post procedure, please call and report this information to [place].    If any biopsies were taken you will be contacted by phone or by letter within the next 1-3 weeks.  Please call us at [phone] if you have not heard about the biopsies in 3 weeks.    SIGNATURES/CONFIDENTIALITY:  You and/or your care partner have signed paperwork which will be entered into your electronic medical record.  These signatures attest to the fact that that the information above on your After Visit Summary has been reviewed and is understood.  Full responsibility of the confidentiality of this discharge information lies with you and/or your care-partner.

## 2022-04-10 ENCOUNTER — Telehealth: Payer: Self-pay

## 2022-04-10 NOTE — Telephone Encounter (Signed)
  Follow up Call-     04/09/2022    9:31 AM 08/09/2020    7:17 AM  Call back number  Post procedure Call Back phone  # [phone]  Permission to leave phone message Yes Yes     Patient questions:  Do you have a fever, pain , or abdominal swelling? No. Pain Score  0 *  Have you tolerated food without any problems? Yes.    Have you been able to return to your normal activities? Yes.    Do you have any questions about your discharge instructions: Diet   No. Medications  No. Follow up visit  No.  Do you have questions or concerns about your Care? No.  Actions: * If pain score is 4 or above: No action needed, pain <4.

## 2022-04-18 ENCOUNTER — Other Ambulatory Visit: Payer: Self-pay | Admitting: Internal Medicine

## 2022-04-18 ENCOUNTER — Other Ambulatory Visit: Payer: Self-pay

## 2022-04-18 DIAGNOSIS — K2970 Gastritis, unspecified, without bleeding: Secondary | ICD-10-CM

## 2022-04-18 MED ORDER — SUCRALFATE 1 G PO TABS: 1.0000 g | ORAL_TABLET | Freq: Four times a day (QID) | ORAL | 0 refills | Status: DC

## 2022-05-05 ENCOUNTER — Other Ambulatory Visit: Payer: Self-pay | Admitting: Family Medicine

## 2022-05-16 ENCOUNTER — Telehealth: Payer: Self-pay | Admitting: Family Medicine

## 2022-05-16 ENCOUNTER — Other Ambulatory Visit: Payer: Self-pay | Admitting: Family Medicine

## 2022-05-16 NOTE — Telephone Encounter (Signed)
Rx refilled.

## 2022-05-16 NOTE — Telephone Encounter (Signed)
Medication: clopidogrel (PLAVIX) 75 MG tablet [[id number]]   Has the patient contacted their pharmacy? No. Patient reports no refills on bottle.   Preferred Pharmacy (with phone number or street name): [facility]   Agent: Please be advised that RX refills may take up to 3 business days. We ask that you follow-up with your pharmacy.

## 2022-05-27 ENCOUNTER — Other Ambulatory Visit: Payer: Self-pay | Admitting: Internal Medicine

## 2022-05-27 DIAGNOSIS — K2970 Gastritis, unspecified, without bleeding: Secondary | ICD-10-CM

## 2022-06-01 ENCOUNTER — Other Ambulatory Visit: Payer: Self-pay | Admitting: Cardiovascular Disease

## 2022-06-01 DIAGNOSIS — E785 Hyperlipidemia, unspecified: Secondary | ICD-10-CM

## 2022-06-12 ENCOUNTER — Other Ambulatory Visit: Payer: Self-pay | Admitting: Cardiovascular Disease

## 2022-06-12 DIAGNOSIS — Z95828 Presence of other vascular implants and grafts: Secondary | ICD-10-CM

## 2022-06-12 DIAGNOSIS — I2510 Atherosclerotic heart disease of native coronary artery without angina pectoris: Secondary | ICD-10-CM

## 2022-06-21 ENCOUNTER — Telehealth: Payer: Self-pay | Admitting: Pulmonary Disease

## 2022-06-21 ENCOUNTER — Encounter: Payer: Self-pay | Admitting: Internal Medicine

## 2022-06-21 ENCOUNTER — Ambulatory Visit (INDEPENDENT_AMBULATORY_CARE_PROVIDER_SITE_OTHER): Payer: Medicare Other | Admitting: Internal Medicine

## 2022-06-21 VITALS — BP 130/50 | HR 74 | Ht 63.0 in | Wt 107.5 lb

## 2022-06-21 DIAGNOSIS — J84112 Idiopathic pulmonary fibrosis: Secondary | ICD-10-CM

## 2022-06-21 DIAGNOSIS — R630 Anorexia: Secondary | ICD-10-CM

## 2022-06-21 DIAGNOSIS — K317 Polyp of stomach and duodenum: Secondary | ICD-10-CM | POA: Diagnosis not present

## 2022-06-21 DIAGNOSIS — K2940 Chronic atrophic gastritis without bleeding: Secondary | ICD-10-CM | POA: Diagnosis not present

## 2022-06-21 NOTE — Telephone Encounter (Signed)
Received refill request for pirfenidone. However patient has not been since in clinic since 06/08/21  Labs also need to be updated.  Routing to scheduling team  Knox Saliva, PharmD, MPH, BCPS, CPP Clinical Pharmacist (Rheumatology and Pulmonology)

## 2022-06-21 NOTE — Patient Instructions (Signed)
We will put a recall in the system for you to get an EGD in July 2026.    Good to see you today.   I appreciate the opportunity to care for you. Silvano Rusk, MD, [facility]

## 2022-06-21 NOTE — Progress Notes (Signed)
Christine Meyer 71 y.o. 02-May-1951 [id number]  Assessment & Plan:   Encounter Diagnoses  Name Primary?   Autoimmune gastritis w/ intestinal metaplasia Yes   Atrophic gastritis without hemorrhage    Gastric polyps - hyperplastic    Anorexia    She is improved at this time.  Still has not anorexia issue but weight is stable.    Plan is to:  Continue current medications specifically pantoprazole twice daily Repeat EGD-surveillance 3 years if medical status supports at the time Investigate/treat recurrent or new signs or symptoms sooner as needed  CC: Carollee Herter, Alferd Apa, DO   Subjective:   Chief Complaint: Follow-up of gastritis  HPI 71 year old African-American woman with a history of peripheral artery disease, coronary artery disease, mesenteric ischemia, gastric ulcers plus autoimmune gastritis plus hyperplastic gastric polyps here for follow-up after upper GI endoscopy in July demonstrated erosive gastropathy and atrophic gastritis changes with intestinal metaplasia again.  See report summary below.  At that point I put her on Carafate 1 g 4 times daily for 1 month.  She is asymptomatic at this time however she continues with anorexia and has not regained weight she lost but her weight is level.  Recall that 1 daughter died this year and another is being treated for breast cancer metastatic to the brain so there are situational stressors.   Wt Readings from Last 3 Encounters:  06/21/22 107 lb 8 oz (48.8 kg)  04/09/22 106 lb (48.1 kg)  03/01/22 106 lb (48.1 kg)    EGD 04/09/2022 - Normal esophagus. - Erosive gastropathy with no stigmata of recent bleeding. Biopsied. Few-several erosions surrounded by erythema - Gastric mucosal atrophy. Biopsied. Antral and body/fundus bottles (mapping) - Multiple gastric polyps. Tiny and known to be hyperplastic on prior biopsies - Normal examined duodenum. 1. Surgical [P], gastric body-multiple erosions - ANTRALIZED OXYNTIC MUCOSA  (ENDOSCOPIC LOCATION OF THE BODY NOTED) WITH CHEMICAL/REACTIVE/REPARATIVE CHANGE, FOCAL EVIDENCE OF EROSIONS, MILD ACTIVITY AND MULTIFOCAL INTESTINAL METAPLASIA ALL FINDINGS CONSISTENT WITH THE CLINICAL HISTORY OF ATROPHIC GASTRITIS. - NEGATIVE FOR DYSPLASIA. 2. Surgical [P], gastric antrum - ANTRAL TYPE MUCOSA WITH CHEMICAL/REACTIVE GASTROPATHY. - NEGATIVE FOR INTESTINAL METAPLASIA. - NO HELICOBACTER PYLORI ORGANISMS IDENTIFIED ON H&E STAINED SLIDE. 3. Surgical [P], fundus and gastric body - ANTRALIZED OXYNTIC MUCOSA (ENDOSCOPIC LOCATION OF FUNDUS AND BODY NOTED) WITH CHEMICAL/REACTIVE/REPARATIVE TYPE CHANGE AND MULTIFOCAL INTESTINAL METAPLASIA CONSISTENT WITH THE CLINICAL HISTORY OF ATROPHIC GASTRITIS. - NEGATIVE FOR DYSPLASIA.   EGD 07/2020 - Gastritis. Biopsied. with erosions and 1 larger ulcer - antrum, body ulcer, body and fundus/cardia bottles for path - Normal esophagus. - Normal examined duodenum.   1. Surgical [P], gastric antrum - ANTRAL MUCOSA WITH SLIGHT CHRONIC INFLAMMATION AND FOCAL INTESTINAL METAPLASIA. - WARTHIN-STARRY NEGATIVE FOR HELICOBACTER PYLORI. - NO DYSPLASIA OR CARCINOMA. 2. Surgical [P], stomach, large gastric body ulcer - MILDLY INFLAMED GASTRIC MUCOSA WITH EXUDATE CONSISTENT WITH ULCER. - NO INTESTINAL METAPLASIA, DYSPLASIA OR CARCINOMA. 3. Surgical [P], stomach, gastric body gastritis - MILD CHRONIC GASTRITIS WITH INTESTINAL METAPLASIA CONSISTENT WITH CHRONIC ATROPHIC GASTRITIS. - FOCAL NEUROENDOCRINE CELL HYPERPLASIA. - WARTHIN-STARRY NEGATIVE FOR HELICOBACTER PYLORI. - NO DYSPLASIA OR CARCINOMA. 4. Surgical [P], stomach, cardia, fundus - MILD CHRONIC GASTRITIS WITH FOCAL REACTIVE CHANGES. - WARTHIN-STARRY NEGATIVE FOR HELICOBACTER PYLORI. - NO INTESTINAL METAPLASIA. DYSPLASIA OR CARCINOMA.   2020 EGD - Multiple gastric polyps. Biopsied. Hyperplastic last year. They remain diminutive. - The examination was otherwise normal.    Diagnosis Surgical [P], fundus and gastric body - gastric polyps - GASTRIC  MUCOSA SHOWING PATCHY CHRONIC GASTRITIS WITH INTESTINAL METAPLASIA AND MICRONODULAR ECL CELL HYPERPLASIA, CONSISTENT WITH ATROPHIC AUTOMMUNE GASTRITIS. SEE NOTE - NEGATIVE FOR DYSPLASIA - WARTHIN STARRY STAIN IS NEGATIVE FOR HELICOBACTER PYLORI     2019 colonoscopy hyperplastic sigmoid polyp and hemorrhoids EGD then hyperplastic gastric polyps- A few gastric polyps. Biopsied. - Z-line irregular. Biopsied. - The examination was otherwise normal. No Known Allergies Current Meds  Medication Sig   ACCU-CHEK SOFTCLIX LANCETS lancets Check blood sugar once daily. Dx:E11.9   aspirin EC 81 MG tablet Take 1 tablet (81 mg total) by mouth daily.   atorvastatin (LIPITOR) 80 MG tablet TAKE 1 TABLET BY MOUTH ONCE  DAILY   Blood Glucose Monitoring Suppl (ONETOUCH VERIO) w/Device KIT Use daily to check blood sugar.   DX E11.9   Calcium 500-100 MG-UNIT CHEW Chew 1 tablet by mouth daily before breakfast.   Cholecalciferol (VITAMIN D3) 50 MCG (2000 UT) capsule Take 1 capsule (2,000 Units total) by mouth daily.   clopidogrel (PLAVIX) 75 MG tablet Take 1 tablet (75 mg total) by mouth daily.   cyanocobalamin (,VITAMIN B-12,) 1000 MCG/ML injection INJECT INTO THE MUSCLE EVERY 30 DAYS   FEROSUL 325 (65 Fe) MG tablet TAKE 1 TABLET BY MOUTH DAILY   glucose blood (ONETOUCH VERIO) test strip USE AS DIRECTED DAILY   guaiFENesin (MUCINEX) 600 MG 12 hr tablet Take 1 tablet (600 mg total) by mouth 2 (two) times daily.   loratadine (CLARITIN) 10 MG tablet Take 1 tablet (10 mg total) by mouth daily.   metoprolol succinate (TOPROL-XL) 25 MG 24 hr tablet TAKE 1 TABLET BY MOUTH DAILY   Multiple Vitamins-Minerals (A THRU Z SELECT 50+ ADVANCED) TABS TAKE 1 TABLET BY MOUTH DAILY (Patient taking differently: Take 1 tablet by mouth daily.)   olmesartan (BENICAR) 20 MG tablet Take 1 tablet (20 mg total) by mouth daily.   pantoprazole (PROTONIX) 40 MG  tablet TAKE 1 TABLET(40 MG) BY MOUTH DAILY BEFORE AND BREAKFAST   Pirfenidone 267 MG TABS TAKE 3 TABLETS (801MG) BY  MOUTH TWICE DAILY WITH FOOD   spironolactone (ALDACTONE) 25 MG tablet TAKE 1 TABLET(25 MG) BY MOUTH DAILY   STIOLTO RESPIMAT 2.5-2.5 MCG/ACT AERS USE 2 INHALATIONS BY MOUTH  DAILY   SYRINGE-NEEDLE, DISP, 3 ML (BD INTEGRA SYRINGE) 25G X 1" 3 ML MISC Use to inject b12 once a month.   [DISCONTINUED] sucralfate (CARAFATE) 1 g tablet Take 1 tablet (1 g total) by mouth QID.   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   CAD (coronary artery disease)    Stent RCA 1999   Chronic kidney disease    COPD (chronic obstructive pulmonary disease) (HCC)    Diabetes mellitus, type 2 ([place])    Diet controlled   Gastric polyps - hyperplastic 02/23/2018   Hyperlipidemia    Hypertension    OSA (obstructive sleep apnea) [name]    PVD (peripheral vascular disease) (HCC)    Carotid stenosis, renal artery stenosis   Sleep apnea    dx'd but doesn't use mask    Uterine cancer ([place]) ?5035'W   Past Surgical History:  Procedure Laterality Date   ABDOMINAL ANGIOGRAM  11/22/2014   Procedure: ABDOMINAL ANGIOGRAM;  Surgeon: Lorretta Harp, MD;  Location: [facility] CATH LAB;  Service: Cardiovascular;;   ABDOMINAL AORTOGRAM W/LOWER EXTREMITY N/A 10/03/2020   Procedure: ABDOMINAL AORTOGRAM W/LOWER EXTREMITY;  Surgeon:  Lorretta Harp, MD;  Location: [place] CV LAB;  Service: Cardiovascular;  Laterality: N/A;   COLONOSCOPY  2000   negative   CORONARY ANGIOPLASTY WITH STENT PLACEMENT  1999   RCA; Dr. Melvern Banker G4 P4   ESOPHAGOGASTRODUODENOSCOPY     ILIAC ARTERY STENT Right 11/22/2014   LOWER EXTREMITY ANGIOGRAM N/A 11/22/2014   Procedure: LOWER EXTREMITY ANGIOGRAM;  Surgeon: Lorretta Harp, MD; L-SFA 100%, 3v runoff, R-pCIA 95>>0% w/ 8 mm x 18 mm long  Balloon expandable stent; R-dCIA 67>>0% w/ 8 mm x 4 cm  long Cordis Smart Nitinol self  expanding stent    LOWER EXTREMITY ANGIOGRAPHY Bilateral 12/08/2020   Procedure: Lower Extremity Angiography;  Surgeon: Lorretta Harp, MD;  Location: [facility] CV LAB;  Service: Cardiovascular;  Laterality: Bilateral;   PERIPHERAL VASCULAR ATHERECTOMY Left 12/08/2020   Procedure: PERIPHERAL VASCULAR ATHERECTOMY;  Surgeon: Lorretta Harp, MD;  Location: [place] CV LAB;  Service: Cardiovascular;  Laterality: Left;  common iliac   PERIPHERAL VASCULAR INTERVENTION Left 12/08/2020   Procedure: PERIPHERAL VASCULAR INTERVENTION;  Surgeon: Lorretta Harp, MD;  Location: [place] CV LAB;  Service: Cardiovascular;  Laterality: Left;  common iliac   RENAL ARTERY STENT Right May 2007; 08/2006   ; restenosis   TOTAL ABDOMINAL HYSTERECTOMY  ?2353'I    for uterine CA   VISCERAL ARTERY INTERVENTION N/A 10/03/2020   Procedure: VISCERAL ARTERY INTERVENTION;  Surgeon: Lorretta Harp, MD;  Location: [place] CV LAB;  Service: Cardiovascular;  Laterality: N/A;  SMA   Social History   Social History Narrative      Daughter [name] is CMA Financial controller primary care [place]   Retired Risk analyst   Smoker, no EtOH/drugs   family history includes Brain cancer in her daughter; Breast cancer in her daughter and daughter; Deep vein thrombosis in her mother; Diabetes in her brother, maternal aunt, maternal grandmother, mother, and sister; Emphysema in her brother; Heart attack in her daughter and paternal aunt; Heart failure in her mother; Hypertension in her brother, father, and mother; Stroke in her daughter.   Review of Systems As per HPI  Objective:   Physical Exam BP (!) 130/50   Pulse 74   Ht 5' 3"  (1.6 m)   Wt 107 lb 8 oz (48.8 kg)   BMI 19.04 kg/m

## 2022-06-22 NOTE — Telephone Encounter (Signed)
Called and spoke with patient, scheduled her to see Rexene Edison NP on 10/31 at 9 am, advised to arrive by 8:45 am for check in.  She verbalized understanding.  Nothing further needed.

## 2022-06-24 ENCOUNTER — Other Ambulatory Visit: Payer: Self-pay | Admitting: Family Medicine

## 2022-06-24 DIAGNOSIS — I10 Essential (primary) hypertension: Secondary | ICD-10-CM

## 2022-07-03 ENCOUNTER — Ambulatory Visit (HOSPITAL_COMMUNITY)
Admission: RE | Admit: 2022-07-03 | Discharge: 2022-07-03 | Disposition: A | Discharge status: Home / Self Care | Payer: Medicare Other | Source: Ambulatory Visit | Attending: Cardiovascular Disease | Admitting: Cardiovascular Disease

## 2022-07-03 ENCOUNTER — Ambulatory Visit (HOSPITAL_BASED_OUTPATIENT_CLINIC_OR_DEPARTMENT_OTHER)
Admission: RE | Admit: 2022-07-03 | Discharge: 2022-07-03 | Disposition: A | Discharge status: Home / Self Care | Payer: Medicare Other | Source: Ambulatory Visit | Attending: Cardiovascular Disease | Admitting: Cardiovascular Disease

## 2022-07-03 DIAGNOSIS — Z95828 Presence of other vascular implants and grafts: Secondary | ICD-10-CM

## 2022-07-03 DIAGNOSIS — F17200 Nicotine dependence, unspecified, uncomplicated: Secondary | ICD-10-CM

## 2022-07-03 DIAGNOSIS — E785 Hyperlipidemia, unspecified: Secondary | ICD-10-CM

## 2022-07-03 DIAGNOSIS — I6523 Occlusion and stenosis of bilateral carotid arteries: Secondary | ICD-10-CM | POA: Diagnosis not present

## 2022-07-03 DIAGNOSIS — I2510 Atherosclerotic heart disease of native coronary artery without angina pectoris: Secondary | ICD-10-CM | POA: Diagnosis not present

## 2022-07-05 ENCOUNTER — Telehealth: Payer: Self-pay | Admitting: Internal Medicine

## 2022-07-05 ENCOUNTER — Other Ambulatory Visit: Payer: Self-pay | Admitting: Internal Medicine

## 2022-07-05 NOTE — Telephone Encounter (Signed)
I left a message for Christine Meyer to confirm how she is taking the pantoprazole. Once or twice a day?

## 2022-07-05 NOTE — Telephone Encounter (Signed)
Patient called states she needs a refill on her pantoprazole. Please call to advise.

## 2022-07-05 NOTE — Telephone Encounter (Signed)
She called back and said she takes her pantoprazole once daily. Sent in the refill as requested.

## 2022-07-09 ENCOUNTER — Other Ambulatory Visit: Payer: Self-pay | Admitting: Family Medicine

## 2022-07-10 ENCOUNTER — Ambulatory Visit (INDEPENDENT_AMBULATORY_CARE_PROVIDER_SITE_OTHER): Payer: Medicare Other

## 2022-07-10 ENCOUNTER — Encounter: Payer: Self-pay | Admitting: Adult Health

## 2022-07-10 ENCOUNTER — Other Ambulatory Visit: Payer: Self-pay | Admitting: Family Medicine

## 2022-07-10 ENCOUNTER — Ambulatory Visit (INDEPENDENT_AMBULATORY_CARE_PROVIDER_SITE_OTHER): Payer: Medicare Other | Admitting: Adult Health

## 2022-07-10 VITALS — BP 116/50 | HR 78 | Temp 98.0°F | Ht 61.0 in | Wt 108.8 lb

## 2022-07-10 DIAGNOSIS — J438 Other emphysema: Secondary | ICD-10-CM

## 2022-07-10 DIAGNOSIS — J84112 Idiopathic pulmonary fibrosis: Secondary | ICD-10-CM | POA: Diagnosis not present

## 2022-07-10 DIAGNOSIS — Z23 Encounter for immunization: Secondary | ICD-10-CM | POA: Diagnosis not present

## 2022-07-10 DIAGNOSIS — J479 Bronchiectasis, uncomplicated: Secondary | ICD-10-CM | POA: Diagnosis not present

## 2022-07-10 DIAGNOSIS — F17200 Nicotine dependence, unspecified, uncomplicated: Secondary | ICD-10-CM

## 2022-07-10 DIAGNOSIS — J439 Emphysema, unspecified: Secondary | ICD-10-CM | POA: Diagnosis not present

## 2022-07-10 LAB — COMPREHENSIVE METABOLIC PANEL
ALT: 16 U/L (ref 0–35)
AST: 17 U/L (ref 0–37)
Albumin: 3.9 g/dL (ref 3.5–5.2)
Alkaline Phosphatase: 58 U/L (ref 39–117)
BUN: 16 mg/dL (ref 6–23)
CO2: 29 mEq/L (ref 19–32)
Calcium: 9.4 mg/dL (ref 8.4–10.5)
Chloride: 105 mEq/L (ref 96–112)
Creatinine, Ser: 1.24 mg/dL — ABNORMAL HIGH (ref 0.40–1.20)
GFR: 43.24 mL/min — ABNORMAL LOW (ref >60.00)
Glucose, Bld: 89 mg/dL (ref 70–99)
Potassium: 4.5 mEq/L (ref 3.5–5.1)
Sodium: 139 mEq/L (ref 135–145)
Total Bilirubin: 0.4 mg/dL (ref 0.2–1.2)
Total Protein: 6.7 g/dL (ref 6.0–8.3)

## 2022-07-10 MED ORDER — ALBUTEROL SULFATE HFA 108 (90 BASE) MCG/ACT IN AERS: 2.0000 | INHALATION_SPRAY | Freq: Four times a day (QID) | RESPIRATORY_TRACT | 6 refills | Status: DC | PRN

## 2022-07-10 NOTE — Patient Instructions (Addendum)
Continue on Esbriet 3 caps Twice daily   Labs and chest xray today  Flu shot today.  Stiolto 2 puffs daily  Albuterol inhaler As needed   Activity as tolerated.  Work on not smoking  High protein diet .  Follow up with Dr. Vaughan Browner in 3 months with PFT and As needed

## 2022-07-10 NOTE — Progress Notes (Unsigned)
_0  ID: Christine Meyer, female    DOB: 04/13/1951, 71 y.o.   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 Evaluated by rheumatology in 2016 by Dr. Trudie Reed for mildly elevated rheumatoid factor with no evidence of connective tissue disease She is started on Esbriet in 2016- tolerates Esbriet 3 caps Twice daily    TEST/EVENTS :  Pets: Dogs, no cats, birds Occupation: Retired Training and development officer Exposures: No known exposures.  No mold, hot tub, Jacuzzi, no feather pillows or comforter Smoking history: 13-pack-year smoker.  Continues to smoke every day Travel history: No significant travel history Relevant family history: No significant family history of lung disease  CT chest 05/09/2018-mild emphysema, slight progression of interstitial lung disease with UIP pattern CT high-resolution 02/16/2020-mild emphysema, stable UIP pulmonary fibrosis. CT high-resolution 08/15/2020- stable pattern of pulmonary fibrosis, emphysema I have reviewed the images personally.   PFTs: 08/19/19 FVC 2.28 [101%], FEV1 1.78 [101%], F/F 78, TLC 3.86 [78%], DLCO 9.31 [49%]   09/08/2020 FVC 1.65 [74%], FEV1 1.05 [61%], F/F 64, TLC 3.64 [74%], DLCO 6.32 (33%) Moderate restriction with severe diffusion defect   Labs: 10/19/2014 ANA, Aldolase, CCP, SCL 70, SSA/SSB, Jo-1 all negative; however ESR 40, RF 17 (both elevated)   Hepatic panel 05/16/2021-LFTs are stable.  Creatinine increased to 1.23   07/10/2022 Follow up : COPD and IPF  Patient presents for follow-up visit.  Last seen September 2022.  Patient says overall she is doing okay.  She gets short of breath with heavy activities.  He is somewhat sedentary.  She remains on Esbriet 3 capsules twice daily.  Does admit she does not take it all the time.   She takes it at least 3 days a week twice daily.  And the rest of the day she takes it once daily.  She denies any increased flare of cough or wheezing.  She says her appetite has been going down.  Weight is down about 10 pounds since last year.  She is trying to use Ensure at least once a day. Remains on Stiolto daily . Still smoking . We discussed cessation. No flare of cough or wheezing .     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   CAD (coronary artery disease)    Stent RCA 1999   Chronic kidney disease    COPD (chronic obstructive pulmonary disease) ([place])    Diabetes mellitus, type 2 ([place])    Diet controlled   Gastric polyps - hyperplastic 02/23/2018   Hyperlipidemia    Hypertension    OSA (obstructive sleep apnea) [name]    PVD (peripheral vascular disease) (HCC)    Carotid stenosis, renal artery stenosis   Sleep apnea    dx'd but doesn't use mask    Uterine cancer ([place]) ?1980's    Tobacco  History: Social History   Tobacco Use  Smoking Status Every Day   Packs/day: 0.25   Years: 51.00   Total pack years: 12.75   Types: Cigarettes  Smokeless Tobacco Never  Tobacco Comments   currently smoking 2-3 cigs per day.  Not trying to quit.  07/10/2022 hfb   Ready to quit: Not Answered Counseling given: Not Answered Tobacco comments: currently smoking 2-3 cigs per day.  Not trying to quit.  07/10/2022 hfb   Outpatient Medications  Prior to Visit  Medication Sig Dispense Refill   ACCU-CHEK SOFTCLIX LANCETS lancets Check blood sugar once daily. Dx:E11.9 100 each 5   albuterol (PROVENTIL HFA) 108 (90 Base) MCG/ACT inhaler Inhale 1-2 puffs into the lungs every 6 (six) hours as needed for wheezing or shortness of breath.     aspirin EC 81 MG tablet Take 1 tablet (81 mg total) by mouth daily. 90 tablet 1   atorvastatin (LIPITOR) 80 MG tablet TAKE 1 TABLET BY MOUTH ONCE  DAILY 100 tablet 0   Blood Glucose Monitoring Suppl (ONETOUCH VERIO) w/Device KIT Use daily to check blood sugar.   DX E11.9 1 kit 0   Calcium 500-100 MG-UNIT CHEW Chew 1 tablet by mouth daily before breakfast. 150 tablet 1   Cholecalciferol (VITAMIN D3) 50 MCG (2000 UT) capsule Take 1 capsule (2,000 Units total) by mouth daily. 60 capsule 11   clopidogrel (PLAVIX) 75 MG tablet Take 1 tablet (75 mg total) by mouth daily. 90 tablet 0   cyanocobalamin (,VITAMIN B-12,) 1000 MCG/ML injection INJECT INTO THE MUSCLE EVERY 30 DAYS 30 mL 3   FEROSUL 325 (65 Fe) MG tablet TAKE 1 TABLET BY MOUTH DAILY 30 tablet 5   glucose blood (ONETOUCH VERIO) test strip USE AS DIRECTED DAILY 100 strip 12   guaiFENesin (MUCINEX) 600 MG 12 hr tablet Take 1 tablet (600 mg total) by mouth 2 (two) times daily. 30 tablet 5   loratadine (CLARITIN) 10 MG tablet Take 1 tablet (10 mg total) by mouth daily. 30 tablet 11   metoprolol succinate (TOPROL-XL) 25 MG 24 hr tablet TAKE 1 TABLET BY MOUTH DAILY 90 tablet 3   Multiple Vitamins-Minerals (A THRU Z SELECT 50+ ADVANCED) TABS TAKE 1 TABLET BY MOUTH DAILY (Patient taking differently: Take 1 tablet by mouth daily.) 220 tablet 1   olmesartan (BENICAR) 20 MG tablet Take 1 tablet (20 mg total) by mouth daily. 90 tablet 0   pantoprazole (PROTONIX) 40 MG tablet TAKE 1 TABLET(40 MG) BY MOUTH DAILY BEFORE BREAKFAST 90 tablet 3   Pirfenidone 267 MG TABS TAKE 3 TABLETS (801MG) BY MOUTH  TWICE DAILY WITH FOOD 180 tablet 2   spironolactone (ALDACTONE) 25 MG  tablet TAKE 1 TABLET BY MOUTH DAILY 90 tablet 0   STIOLTO RESPIMAT 2.5-2.5 MCG/ACT AERS USE 2 INHALATIONS BY MOUTH  DAILY 12 g 3   SYRINGE-NEEDLE, DISP, 3 ML (BD INTEGRA SYRINGE) 25G X 1" 3 ML MISC Use to inject b12 once a month. 12 each 1   No facility-administered medications prior to visit.     Review of Systems:   Constitutional:   No  weight loss, night sweats,  Fevers, chills, fatigue, or  lassitude.  HEENT:   No headaches,  Difficulty swallowing,  Tooth/dental problems, or  Sore throat,                No sneezing, itching, ear ache, nasal congestion, post nasal drip,   CV:  No chest pain,  Orthopnea, PND, swelling in  lower extremities, anasarca, dizziness, palpitations, syncope.   GI  No heartburn, indigestion, abdominal pain, nausea, vomiting, diarrhea, change in bowel habits, loss of appetite, bloody stools.   Resp: No shortness of breath with exertion or at rest.  No excess mucus, no productive cough,  No non-productive cough,  No coughing up of blood.  No change in color of mucus.  No wheezing.  No chest wall deformity  Skin: no rash or lesions.  GU: no dysuria, change in color of urine, no urgency or frequency.  No flank pain, no hematuria   MS:  No joint pain or swelling.  No decreased range of motion.  No back pain.    Physical Exam  BP (!) 116/50 (BP Location: Left Arm, Patient Position: Sitting, Cuff Size: Normal)   Pulse 78   Temp 98 F (36.7 C) (Oral)   Ht _0  (1.549 m)   Wt 108 lb 12.8 oz (49.4 kg)   SpO2 99%   BMI 20.56 kg/m   GEN: A/Ox3; pleasant , NAD, well nourished    HEENT:  [place]/AT,  EACs-clear, TMs-wnl, NOSE-clear, THROAT-clear, no lesions, no postnasal drip or exudate noted.   NECK:  Supple w/ fair ROM; no JVD; normal carotid impulses w/o bruits; no thyromegaly or nodules palpated; no lymphadenopathy.    RESP  Clear  P & A; w/o, wheezes/ rales/ or rhonchi. no accessory muscle use, no dullness to percussion  CARD:  RRR, no m/r/g, no  peripheral edema, pulses intact, no cyanosis or clubbing.  GI:   Soft & nt; nml bowel sounds; no organomegaly or masses detected.   Musco: Warm bil, no deformities or joint swelling noted.   Neuro: alert, no focal deficits noted.    Skin: Warm, no lesions or rashes    Lab Results:  CBC    Component Value Date/Time   WBC 3.9 (L) 01/23/2022 1112   RBC 3.92 01/23/2022 1112   HGB 11.6 (L) 01/23/2022 1112   HGB 8.1 (L) 12/02/2020 0000   HCT 34.9 (L) 01/23/2022 1112   HCT 25.8 (L) 12/02/2020 0000   PLT 213.0 01/23/2022 1112   PLT 116 (L) 12/02/2020 0000   MCV 89.0 01/23/2022 1112   MCV 91 12/02/2020 0000   MCH 29.4 08/04/2021 1457   MCHC 33.1 01/23/2022 1112   RDW 15.3 01/23/2022 1112   RDW 13.2 12/02/2020 0000   LYMPHSABS 1.7 01/23/2022 1112   MONOABS 0.4 01/23/2022 1112   EOSABS 0.1 01/23/2022 1112   BASOSABS 0.0 01/23/2022 1112    BMET    Component Value Date/Time   NA 140 01/23/2022 1112   NA 140 05/16/2021 1032   K 4.5 01/23/2022 1112   CL 105 01/23/2022 1112   CO2 27 01/23/2022 1112   GLUCOSE 92 01/23/2022 1112   GLUCOSE 95 08/28/2006 1432   BUN 13 01/23/2022 1112   BUN 19 05/16/2021 1032   CREATININE 1.19 01/23/2022 1112   CREATININE 1.06 (H) 07/07/2020 1411   CALCIUM 9.3 01/23/2022 1112   GFRNONAA 48 (L) 08/04/2021 1457   GFRAA 57 (L) 09/08/2020 1233    BNP No results found for: "BNP"  ProBNP    Component Value Date/Time   PROBNP 84 08/08/2020 0859    Imaging: VAS [place] MESENTERIC  Result Date: 07/04/2022 ABDOMINAL VISCERAL Patient Name:  KENLEI SAFI  Date of Exam:   07/03/2022 Medical Rec #: [id number]        Accession #:    [id number] Date of Birth: 10-04-1950  Patient Gender: F Patient Age:   55 years Exam Location:  [facility] Procedure:      VAS [place] MESENTERIC Referring Phys: [id number] JONATHAN J BERRY -------------------------------------------------------------------------------- Indications: History of bilateral common iliac artery  interventions; known              bilateral SFA occlusions. Patient denies claudication symptoms or              rest pain at this time. She does complain of cold feet at night.              Patient reports that she does not have much of an appetite due to              her stomach issues, but when she does eat she no longer experiences              post-prandial pain. High Risk Factors: Hypertension, hyperlipidemia, Diabetes, current smoker,                    coronary artery disease. Vascular Interventions: Successful ostial and distal right common iliac artery                         stenting with a Balloon expandable stent in the ostium                         of the RCIA and nitinol self-expanding stent in                         the mid with excellent angiographic result on                         11/22/2014. Successful orbital atherectomy, VBX covered                         stenting of highly calcified ostial/proximal left common                         iliac artery with a total left SFA and three-vessel                         runoff on 12/08/2020. Successful SMA PTA and covered                         stenting using a VBX covered stent with excellent result                         on 10/03/2020.                         Residual celiac axis stenosis. Limitations: Air/bowel gas. Technically challenging examination, very limited visualization of the iliac fossa due to dense pockets of bowel gas. Comparison Study: Previous visceral duplex performed 06/15/21 showed RCIA psv of                   138 cm/sec, LCIA psv of 115 cm/sec, REIA psv of 320 cm/sec and                   LEIA psv of 236 cm/sec. Mesenteric: celiac velocity 511/121  cm/sec, SMA stent velocity of 374/49 cm/sec, IMA psv 585                   cm/sec, CHA 122/14 cm/sec and splenic artery 271/20 cm/sec. Performing Technologist: Mariane Masters RVT  Examination Guidelines: A complete evaluation includes B-mode imaging, spectral  Doppler, color Doppler, and power Doppler as needed of all accessible portions of each vessel. Bilateral testing is considered an integral part of a complete examination. Limited examinations for reoccurring indications may be performed as noted.  Duplex Findings: +--------------------+--------+--------+--------+--------+ Mesenteric          PSV cm/sEDV cm/s Plaque Comments +--------------------+--------+--------+--------+--------+ Aorta Prox             53      1                     +--------------------+--------+--------+--------+--------+ Aorta Mid              53      0                     +--------------------+--------+--------+--------+--------+ Aorta Distal           42      0                     +--------------------+--------+--------+--------+--------+ Celiac Artery Origin  489           calcific         +--------------------+--------+--------+--------+--------+ SMA Origin            312      44                    +--------------------+--------+--------+--------+--------+ SMA Proximal          464      74                    +--------------------+--------+--------+--------+--------+ SMA Mid               346      41                    +--------------------+--------+--------+--------+--------+ SMA Distal            158      26                    +--------------------+--------+--------+--------+--------+ CHA                   165      33                    +--------------------+--------+--------+--------+--------+ Splenic               256      81                    +--------------------+--------+--------+--------+--------+ IMA                   567      29                    +--------------------+--------+--------+--------+--------+ Mesenteric Technologist observations: Patent SMA stent with essentially stable elevated velocities throughout. Patent CHA and splenic artery. Essentially stable elevated velocities in the IMA and celiac  artery.  Right Iliac vessels: Right CIA prox: 73 cm/sec, biphasic Very limited visualization of the right common iliac  artery due to dense pocket of overlying bowel gas. Mid and distal segment not able to be visualized. Right EIA prox: 169 cm/sec, biphasic, turbulent Right EIA mid: 159 cm/sec, biphasic Right EIA dist: 118 cm/sec, biphasic Left Iliac vessels: Left CIA prox: 101 cm/sec, nearly biphasic Left CIA mid: 92 cm/sec, biphasic Left CIA dist: 66 cm/sec, monophasic Left EIA prox: 203 cm/sec, monophasic Left EIA mid: 217 cm/sec, monophasic Left EIA dist: 190 cm/sec, monophasic  Technologist observations: The proximal left renal artery appears patent with PSV of 101/15 cm/sec.  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 The Inferior Mesenteric artery appears stenotic. Patent superior mesenteric artery with essentially stable elevated velocities throughout, s/p stenting.  Aorta-Iliac:  Patent bilateral common iliac arteries without evidence of focal stenosis, s/p stenting.  >50% left external iliac artery stenosis, mild. Limited visualization of the right common and external iliac arteries due to bowel gas, cannot rule out all areas of stenosis.  *See table(s) above for measurements and observations. See ABI report.  Suggest follow up study in 12 months.  Diagnosing physician: Larae Grooms MD  Electronically signed by Larae Grooms MD on 07/04/2022 at 4:48:38 PM.    Final    VAS [place] ABI WITH/WO TBI  Result Date: 07/04/2022  LOWER EXTREMITY DOPPLER STUDY Patient Name:  Christine Meyer  Date of Exam:   07/03/2022 Medical Rec #: [id number]        Accession #:    [id number] Date of Birth: [birth year]        Patient Gender: F Patient Age:   62 years Exam Location:  [facility] Procedure:      VAS [place] ABI WITH/WO TBI Referring Phys: Roderic Palau BERRY --------------------------------------------------------------------------------  Indications: Peripheral artery disease. History of bilateral common  iliac artery              interventions and bilateral SFA occlusions. Patient denies              claudication symptoms or rest pain at this time. She does complain              of cold feet at night which she wears thick socks to bed to help              alleviate. High Risk Factors: Hypertension, hyperlipidemia, Diabetes, current smoker,                    coronary artery disease.  Vascular Interventions: Successful ostial and distal right common iliac artery                         stenting with a Balloon expandable stent in the ostium                         of the RCIA and nitinol self-expanding stent in                         the mid with excellent angiographic result on                         11/22/2014. Successful orbital atherectomy, VBX covered                         stenting of highly calcified ostial/proximal left common  iliac artery with a total left SFA and three-vessel                         runoff on 12/08/2020. Successful SMA PTA and covered                         stenting using a VBX covered stent with excellent result                         on 10/03/2020.                         Residual celiac axis stenosis. Comparison Study: Previous ABIs performed 06/15/21 were 0.46 on the right and                   0.45 on the left. Performing Technologist: Mariane Masters RVT  Examination Guidelines: A complete evaluation includes at minimum, Doppler waveform signals and systolic blood pressure reading at the level of bilateral brachial, anterior tibial, and posterior tibial arteries, when vessel segments are accessible. Bilateral testing is considered an integral part of a complete examination. Photoelectric Plethysmograph (PPG) waveforms and toe systolic pressure readings are included as required and additional duplex testing as needed. Limited examinations for reoccurring indications may be performed as noted.  ABI Findings:  +---------+------------------+-----+----------+--------+ Right    Rt Pressure (mmHg)IndexWaveform  Comment  +---------+------------------+-----+----------+--------+ Brachial 153                                       +---------+------------------+-----+----------+--------+ PTA      89                0.58 monophasic         +---------+------------------+-----+----------+--------+ PERO     84                0.55 monophasic         +---------+------------------+-----+----------+--------+ DP       87                0.57 monophasic         +---------+------------------+-----+----------+--------+ Great Toe54                0.35 Abnormal           +---------+------------------+-----+----------+--------+ +---------+------------------+-----+----------+-------+ Left     Lt Pressure (mmHg)IndexWaveform  Comment +---------+------------------+-----+----------+-------+ Brachial 153                                      +---------+------------------+-----+----------+-------+ PTA      82                0.54 monophasic        +---------+------------------+-----+----------+-------+ PERO     84                0.55 monophasic        +---------+------------------+-----+----------+-------+ DP       80                0.52 monophasic        +---------+------------------+-----+----------+-------+ Great Toe40                0.26 Abnormal          +---------+------------------+-----+----------+-------+ +-------+-----------+-----------+------------+------------+  ABI/TBIToday's ABIToday's TBIPrevious ABIPrevious TBI +-------+-----------+-----------+------------+------------+ Right  0.58       0.35       0.46        0.35         +-------+-----------+-----------+------------+------------+ Left   0.55       0.26       0.45        0.30         +-------+-----------+-----------+------------+------------+ Bilateral ABIs appear increased compared to prior study on  06/15/21.  Summary: Right: Resting right ankle-brachial index indicates moderate right lower extremity arterial disease. The right toe-brachial index is abnormal. Left: Resting left ankle-brachial index indicates moderate left lower extremity arterial disease. The left toe-brachial index is abnormal. *See table(s) above for measurements and observations. See visceral duplex report.  Suggest follow up study in 12 months. Electronically signed by Larae Grooms MD on 07/04/2022 at 4:48:14 PM.    Final    VAS US CAROTID  Result Date: 07/04/2022 Carotid Arterial Duplex Study Patient Name:  SHAIA PORATH  Date of Exam:   07/03/2022 Medical Rec #: [id number]        Accession #:    [id number] Date of Birth: [birth year]        Patient Gender: F Patient Age:   80 years Exam Location:  [facility] Procedure:      VAS US CAROTID Referring Phys: Roderic Palau BERRY --------------------------------------------------------------------------------  Indications:       Carotid artery disease.                    Patient denies any cerebrovascular symptoms. Risk Factors:      Hypertension, hyperlipidemia, Diabetes, current smoker,                    coronary artery disease, PAD. Comparison Study:  Previous carotid duplex performed 09/05/2020 showed RICA                    velocities of 262/03 cm/sec and LICA velocities of 559/74                    cm/sec. Performing Technologist: Mariane Masters RVT  Examination Guidelines: A complete evaluation includes B-mode imaging, spectral Doppler, color Doppler, and power Doppler as needed of all accessible portions of each vessel. Bilateral testing is considered an integral part of a complete examination. Limited examinations for reoccurring indications may be performed as noted.  Right Carotid Findings: +----------+--------+--------+--------+------------------+--------+           PSV cm/sEDV cm/sStenosisPlaque DescriptionComments  +----------+--------+--------+--------+------------------+--------+ CCA Prox  104     16      <50%    heterogenous               +----------+--------+--------+--------+------------------+--------+ CCA Distal55      11                                         +----------+--------+--------+--------+------------------+--------+ ICA Prox  134     41              heterogenous               +----------+--------+--------+--------+------------------+--------+ ICA Mid   187     45      40-59%                             +----------+--------+--------+--------+------------------+--------+  ICA Distal154     49                                         +----------+--------+--------+--------+------------------+--------+ ECA       132     0                                          +----------+--------+--------+--------+------------------+--------+ +----------+--------+-------+----------------+-------------------+           PSV cm/sEDV cmsDescribe        Arm Pressure (mmHG) +----------+--------+-------+----------------+-------------------+ Subclavian106     0      Multiphasic, [id number]                 +----------+--------+-------+----------------+-------------------+ +---------+--------+--+--------+--+---------+ VertebralPSV cm/s77EDV cm/s20Antegrade +---------+--------+--+--------+--+---------+  Left Carotid Findings: +----------+--------+--------+--------+------------------+-----------------+           PSV cm/sEDV cm/sStenosisPlaque DescriptionComments          +----------+--------+--------+--------+------------------+-----------------+ CCA Prox  74      12      <50%    heterogenous                        +----------+--------+--------+--------+------------------+-----------------+ CCA Mid   69      14                                                  +----------+--------+--------+--------+------------------+-----------------+ CCA Distal63      14                                                   +----------+--------+--------+--------+------------------+-----------------+ ICA Prox  242     62      40-59%  heterogenous      high-end of range +----------+--------+--------+--------+------------------+-----------------+ ICA Mid   150     47                                                  +----------+--------+--------+--------+------------------+-----------------+ ICA Distal143     44                                                  +----------+--------+--------+--------+------------------+-----------------+ ECA       59      1                                                   +----------+--------+--------+--------+------------------+-----------------+ +----------+--------+--------+----------------+-------------------+           PSV cm/sEDV cm/sDescribe        Arm Pressure (mmHG) +----------+--------+--------+----------------+-------------------+ [id number]     1  Multiphasic, [id number]                 +----------+--------+--------+----------------+-------------------+ +---------+--------+--+--------+--+---------+ VertebralPSV cm/s86EDV cm/s15Antegrade +---------+--------+--+--------+--+---------+   Summary: Right Carotid: Velocities in the right ICA are consistent with a 40-59%                stenosis. Non-hemodynamically significant plaque <50% noted in                the CCA. Left Carotid: Velocities in the left ICA are consistent with a 40-59% stenosis.               Non-hemodynamically significant plaque <50% noted in the CCA. Vertebrals:  Bilateral vertebral arteries demonstrate antegrade flow. Subclavians: Normal flow hemodynamics were seen in bilateral subclavian              arteries. *See table(s) above for measurements and observations. Suggest follow up study in 12 months. Electronically signed by Larae Grooms MD on 07/04/2022 at 4:47:26 PM.    Final          Latest Ref Rng & Units 09/08/2020    10:32 AM 08/19/2019    8:54 AM 01/09/2018    8:51 AM 07/08/2017    2:50 PM 07/07/2015   10:05 AM 11/23/2014    7:51 AM  PFT Results  FVC-Pre L 1.81  2.33  2.35  2.48  2.45  2.55   FVC-Predicted Pre % 81  103  107  112  108  111   FVC-Post L 1.65  2.28  2.33  2.38  2.41  2.32   FVC-Predicted Post % 74  101  106  108  106  101   Pre FEV1/FVC % % 79  70  79  80  80  71   Post FEV1/FCV % % 64  78  82  69  83  73   FEV1-Pre L 1.43  1.63  1.86  1.99  1.97  1.82   FEV1-Predicted Pre % 83  93  109  116  111  102   FEV1-Post L 1.05  1.78  1.91  1.63  2.00  1.70   DLCO uncorrected ml/min/mmHg 6.32  9.31  8.63  9.07  9.51  8.31   DLCO UNC% % 33  49  40  42  44  38   DLCO corrected ml/min/mmHg 6.65  9.76     9.21   DLCO COR %Predicted % 35  51     42   DLVA Predicted % 59  75  62  58  60  71   TLC L 3.64  3.86  4.19  4.72  3.82  4.16   TLC % Predicted % 74  78  88  99  80  87   RV % Predicted % 98  94  92  119  71  103     No results found for: "NITRICOXIDE"      Assessment & Plan:   No problem-specific Assessment & Plan notes found for this encounter.     Rexene Edison, NP 07/10/2022

## 2022-07-11 DIAGNOSIS — J84112 Idiopathic pulmonary fibrosis: Secondary | ICD-10-CM | POA: Insufficient documentation

## 2022-07-11 MED ORDER — PIRFENIDONE 267 MG PO TABS: ORAL_TABLET | ORAL | 5 refills | Status: DC

## 2022-07-11 NOTE — Assessment & Plan Note (Signed)
Smoking cessation discussed 

## 2022-07-11 NOTE — Assessment & Plan Note (Signed)
Pulmonary fibrosis appears to be stable.  Patient is encouraged on Esbriet compliance. Encouraged on high protein diet.  Continue with Ensure.  Continue to watch weight closely.  Labs today. X-ray today Check PFT on return   Plan  Patient Instructions  Continue on Esbriet 3 caps Twice daily   Labs and chest xray today  Flu shot today.  Stiolto 2 puffs daily  Albuterol inhaler As needed   Activity as tolerated.  Work on not smoking  High protein diet .  Follow up with Christine Meyer in 3 months with PFT and As needed

## 2022-07-11 NOTE — Addendum Note (Signed)
Addended by: Cassandria Anger on: 07/11/2022 03:16 PM   Modules accepted: Orders

## 2022-07-11 NOTE — Telephone Encounter (Signed)
Refill sent for PIRFENIDONE to [facility]: [phone]   Dose: 801 mg three times daily (she sometimes takes 6 tablets daily and other days 9 tabs daily therefore will keep her on '267mg'$  tabs for now)  Last OV: 07/10/22 Provider: Dr. Vaughan Browner  Next OV: 3 months  CMET on 07/11/22 stable  Spoke with patient. Advised of labs. She has already received refill last week from [facility]. Nothing further needed.  Knox Saliva, PharmD, MPH, BCPS Clinical Pharmacist (Rheumatology and Pulmonology)

## 2022-07-11 NOTE — Assessment & Plan Note (Signed)
Compensated on present regimen.  Continue current maintenance Flu shot today.  Plan  Patient Instructions  Continue on Esbriet 3 caps Twice daily   Labs and chest xray today  Flu shot today.  Stiolto 2 puffs daily  Albuterol inhaler As needed   Activity as tolerated.  Work on not smoking  High protein diet .  Follow up with Dr. Vaughan Browner in 3 months with PFT and As needed

## 2022-07-12 MED ORDER — PIRFENIDONE 267 MG PO TABS: ORAL_TABLET | ORAL | 5 refills | Status: DC

## 2022-07-12 NOTE — Addendum Note (Signed)
Addended by: Cassandria Anger on: 07/12/2022 02:32 PM   Modules accepted: Orders

## 2022-07-13 MED ORDER — PIRFENIDONE 267 MG PO TABS: ORAL_TABLET | ORAL | 1 refills | Status: DC

## 2022-07-13 NOTE — Addendum Note (Signed)
Addended by: Cassandria Anger on: 07/13/2022 04:24 PM   Modules accepted: Orders

## 2022-07-13 NOTE — Progress Notes (Signed)
Called and spoke with patient, provided results/recommendations per Tammy Parrett NP.  She verbalized understanding.  Nothing further needed.

## 2022-07-23 ENCOUNTER — Ambulatory Visit (INDEPENDENT_AMBULATORY_CARE_PROVIDER_SITE_OTHER): Payer: Medicare Other | Admitting: *Deleted

## 2022-07-23 ENCOUNTER — Telehealth: Payer: Self-pay

## 2022-07-23 DIAGNOSIS — Z0000 Encounter for general adult medical examination without abnormal findings: Secondary | ICD-10-CM

## 2022-07-23 NOTE — Patient Instructions (Signed)
Christine Meyer , Thank you for taking time to come for your Medicare Wellness Visit. I appreciate your ongoing commitment to your health goals. Please review the following plan we discussed and let me know if I can assist you in the future.   These are the goals we discussed:  Goals      Patient Stated     Continue to drink plenty of water     Quit smoking / using tobacco        This is a list of the screening recommended for you and due dates:  Health Maintenance  Topic Date Due   Zoster (Shingles) Vaccine (1 of 2) Never done   Tetanus Vaccine  11/08/2013   Eye exam for diabetics  01/19/2021   COVID-19 Vaccine (4 - Pfizer series) 03/03/2021   Complete foot exam   05/01/2022   Hemoglobin A1C  07/26/2022   Yearly kidney health urinalysis for diabetes  01/24/2023   Yearly kidney function blood test for diabetes  07/11/2023   Medicare Annual Wellness Visit  07/24/2023   Mammogram  02/21/2024   Colon Cancer Screening  02/19/2028   Pneumonia Vaccine  Completed   Flu Shot  Completed   DEXA scan (bone density measurement)  Completed   Hepatitis C Screening: USPSTF Recommendation to screen - Ages 35-79 yo.  Completed   HPV Vaccine  Aged Out     Next appointment: Follow up in one year for your annual wellness visit    Preventive Care 65 Years and Older, Female Preventive care refers to lifestyle choices and visits with your health care provider that can promote health and wellness. What does preventive care include? A yearly physical exam. This is also called an annual well check. Dental exams once or twice a year. Routine eye exams. Ask your health care provider how often you should have your eyes checked. Personal lifestyle choices, including: Daily care of your teeth and gums. Regular physical activity. Eating a healthy diet. Avoiding tobacco and drug use. Limiting alcohol use. Practicing safe sex. Taking low-dose aspirin every day. Taking vitamin and mineral supplements as  recommended by your health care provider. What happens during an annual well check? The services and screenings done by your health care provider during your annual well check will depend on your age, overall health, lifestyle risk factors, and family history of disease. Counseling  Your health care provider may ask you questions about your: Alcohol use. Tobacco use. Drug use. Emotional well-being. Home and relationship well-being. Sexual activity. Eating habits. History of falls. Memory and ability to understand (cognition). Work and work Statistician. Reproductive health. Screening  You may have the following tests or measurements: Height, weight, and BMI. Blood pressure. Lipid and cholesterol levels. These may be checked every 5 years, or more frequently if you are over 53 years old. Skin check. Lung cancer screening. You may have this screening every year starting at age 97 if you have a 30-pack-year history of smoking and currently smoke or have quit within the past 15 years. Fecal occult blood test (FOBT) of the stool. You may have this test every year starting at age 17. Flexible sigmoidoscopy or colonoscopy. You may have a sigmoidoscopy every 5 years or a colonoscopy every 10 years starting at age 77. Hepatitis C blood test. Hepatitis B blood test. Sexually transmitted disease (STD) testing. Diabetes screening. This is done by checking your blood sugar (glucose) after you have not eaten for a while (fasting). You may have this done  every 1-3 years. Bone density scan. This is done to screen for osteoporosis. You may have this done starting at age 82. Mammogram. This may be done every 1-2 years. Talk to your health care provider about how often you should have regular mammograms. Talk with your health care provider about your test results, treatment options, and if necessary, the need for more tests. Vaccines  Your health care provider may recommend certain vaccines, such  as: Influenza vaccine. This is recommended every year. Tetanus, diphtheria, and acellular pertussis (Tdap, Td) vaccine. You may need a Td booster every 10 years. Zoster vaccine. You may need this after age 53. Pneumococcal 13-valent conjugate (PCV13) vaccine. One dose is recommended after age 88. Pneumococcal polysaccharide (PPSV23) vaccine. One dose is recommended after age 7. Talk to your health care provider about which screenings and vaccines you need and how often you need them. This information is not intended to replace advice given to you by your health care provider. Make sure you discuss any questions you have with your health care provider. Document Released: 09/23/2015 Document Revised: 05/16/2016 Document Reviewed: 06/28/2015 Elsevier Interactive Patient Education  2017 [place] Prevention in the Home Falls can cause injuries. They can happen to people of all ages. There are many things you can do to make your home safe and to help prevent falls. What can I do on the outside of my home? Regularly fix the edges of walkways and driveways and fix any cracks. Remove anything that might make you trip as you walk through a door, such as a raised step or threshold. Trim any bushes or trees on the path to your home. Use bright outdoor lighting. Clear any walking paths of anything that might make someone trip, such as rocks or tools. Regularly check to see if handrails are loose or broken. Make sure that both sides of any steps have handrails. Any raised decks and porches should have guardrails on the edges. Have any leaves, snow, or ice cleared regularly. Use sand or salt on walking paths during winter. Clean up any spills in your garage right away. This includes oil or grease spills. What can I do in the bathroom? Use night lights. Install grab bars by the toilet and in the tub and shower. Do not use towel bars as grab bars. Use non-skid mats or decals in the tub or  shower. If you need to sit down in the shower, use a plastic, non-slip stool. Keep the floor dry. Clean up any water that spills on the floor as soon as it happens. Remove soap buildup in the tub or shower regularly. Attach bath mats securely with double-sided non-slip rug tape. Do not have throw rugs and other things on the floor that can make you trip. What can I do in the bedroom? Use night lights. Make sure that you have a light by your bed that is easy to reach. Do not use any sheets or blankets that are too big for your bed. They should not hang down onto the floor. Have a firm chair that has side arms. You can use this for support while you get dressed. Do not have throw rugs and other things on the floor that can make you trip. What can I do in the kitchen? Clean up any spills right away. Avoid walking on wet floors. Keep items that you use a lot in easy-to-reach places. If you need to reach something above you, use a strong step stool that has  a grab bar. Keep electrical cords out of the way. Do not use floor polish or wax that makes floors slippery. If you must use wax, use non-skid floor wax. Do not have throw rugs and other things on the floor that can make you trip. What can I do with my stairs? Do not leave any items on the stairs. Make sure that there are handrails on both sides of the stairs and use them. Fix handrails that are broken or loose. Make sure that handrails are as long as the stairways. Check any carpeting to make sure that it is firmly attached to the stairs. Fix any carpet that is loose or worn. Avoid having throw rugs at the top or bottom of the stairs. If you do have throw rugs, attach them to the floor with carpet tape. Make sure that you have a light switch at the top of the stairs and the bottom of the stairs. If you do not have them, ask someone to add them for you. What else can I do to help prevent falls? Wear shoes that: Do not have high heels. Have  rubber bottoms. Are comfortable and fit you well. Are closed at the toe. Do not wear sandals. If you use a stepladder: Make sure that it is fully opened. Do not climb a closed stepladder. Make sure that both sides of the stepladder are locked into place. Ask someone to hold it for you, if possible. Clearly mark and make sure that you can see: Any grab bars or handrails. First and last steps. Where the edge of each step is. Use tools that help you move around (mobility aids) if they are needed. These include: Canes. Walkers. Scooters. Crutches. Turn on the lights when you go into a dark area. Replace any light bulbs as soon as they burn out. Set up your furniture so you have a clear path. Avoid moving your furniture around. If any of your floors are uneven, fix them. If there are any pets around you, be aware of where they are. Review your medicines with your doctor. Some medicines can make you feel dizzy. This can increase your chance of falling. Ask your doctor what other things that you can do to help prevent falls. This information is not intended to replace advice given to you by your health care provider. Make sure you discuss any questions you have with your health care provider. Document Released: 06/23/2009 Document Revised: 02/02/2016 Document Reviewed: 10/01/2014 Elsevier Interactive Patient Education  2017 [employer].

## 2022-07-23 NOTE — Progress Notes (Signed)
Subjective:   Christine Meyer is a 71 y.o. female who presents for Medicare Annual (Subsequent) preventive examination.  I connected with  Christine Meyer on 07/23/22 by a audio enabled telemedicine application and verified that I am speaking with the correct person using two identifiers.  Patient Location: Home  Provider Location: Office/Clinic  I discussed the limitations of evaluation and management by telemedicine. The patient expressed understanding and agreed to proceed.   Review of Systems    Defer to PCP Cardiac Risk Factors include: advanced age (>27mn, >>3women);diabetes mellitus;dyslipidemia;hypertension     Objective:    There were no vitals filed for this visit. There is no height or weight on file to calculate BMI.     07/23/2022    9:42 AM 08/04/2021   12:59 PM 07/20/2021    9:05 AM 12/08/2020    7:54 AM 10/04/2020    6:08 AM 10/03/2020    9:24 AM 10/15/2016   10:49 AM  Advanced Directives  Does Patient Have a Medical Advance Directive? Yes _0  No  Type of AParamedicof [place] will        Does patient want to make changes to medical advance directive? No - Patient declined        Copy of HTrailin Chart? Yes - validated most recent copy scanned in chart (See row information)        Would patient like information on creating a medical advance directive?  No - Patient declined Yes (MAU/Ambulatory/Procedural Areas - Information given) No - Patient declined No - Patient declined No - Patient declined No - Patient declined    Current Medications (verified) Outpatient Encounter Medications as of 07/23/2022  Medication Sig   ACCU-CHEK SOFTCLIX LANCETS lancets Check blood sugar once daily. Dx:E11.9   albuterol (PROVENTIL HFA) 108 (90 Base) MCG/ACT inhaler Inhale 1-2 puffs into the lungs every 6 (six) hours as needed for wheezing or shortness of breath.   albuterol (VENTOLIN HFA) 108 (90 Base) MCG/ACT  inhaler Inhale 2 puffs into the lungs every 6 (six) hours as needed for wheezing or shortness of breath.   aspirin EC 81 MG tablet Take 1 tablet (81 mg total) by mouth daily.   atorvastatin (LIPITOR) 80 MG tablet TAKE 1 TABLET BY MOUTH ONCE  DAILY   Blood Glucose Monitoring Suppl (ONETOUCH VERIO) w/Device KIT Use daily to check blood sugar.   DX E11.9   Calcium 500-100 MG-UNIT CHEW Chew 1 tablet by mouth daily before breakfast.   Cholecalciferol (VITAMIN D3) 50 MCG (2000 UT) capsule Take 1 capsule (2,000 Units total) by mouth daily.   clopidogrel (PLAVIX) 75 MG tablet TAKE 1 TABLET BY MOUTH DAILY   cyanocobalamin (,VITAMIN B-12,) 1000 MCG/ML injection INJECT INTO THE MUSCLE EVERY 30 DAYS   FEROSUL 325 (65 Fe) MG tablet TAKE 1 TABLET BY MOUTH DAILY   glucose blood (ONETOUCH VERIO) test strip USE AS DIRECTED DAILY   guaiFENesin (MUCINEX) 600 MG 12 hr tablet Take 1 tablet (600 mg total) by mouth 2 (two) times daily.   loratadine (CLARITIN) 10 MG tablet Take 1 tablet (10 mg total) by mouth daily.   metoprolol succinate (TOPROL-XL) 25 MG 24 hr tablet TAKE 1 TABLET BY MOUTH DAILY   Multiple Vitamins-Minerals (A THRU Z SELECT 50+ ADVANCED) TABS TAKE 1 TABLET BY MOUTH DAILY (Patient taking differently: Take 1 tablet by mouth daily.)   olmesartan (BENICAR) 20 MG tablet Take 1 tablet (20 mg total)  by mouth daily.   pantoprazole (PROTONIX) 40 MG tablet TAKE 1 TABLET(40 MG) BY MOUTH DAILY BEFORE BREAKFAST   Pirfenidone 267 MG TABS TAKE 3 TABLETS (801MG) BY MOUTH  TWICE DAILY WITH FOOD   spironolactone (ALDACTONE) 25 MG tablet TAKE 1 TABLET BY MOUTH DAILY   STIOLTO RESPIMAT 2.5-2.5 MCG/ACT AERS USE 2 INHALATIONS BY MOUTH  DAILY   SYRINGE-NEEDLE, DISP, 3 ML (BD INTEGRA SYRINGE) 25G X 1" 3 ML MISC Use to inject b12 once a month.   No facility-administered encounter medications on file as of 07/23/2022.    Allergies (verified) Patient has no known allergies.   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   CAD (coronary artery disease)    Stent RCA 1999   Chronic kidney disease    COPD (chronic obstructive pulmonary disease) (HCC)    Diabetes mellitus, type 2 ([place])    Diet controlled   Gastric polyps - hyperplastic 02/23/2018   Hyperlipidemia    Hypertension    OSA (obstructive sleep apnea) [name]    PVD (peripheral vascular disease) (HCC)    Carotid stenosis, renal artery stenosis   Sleep apnea    dx'd but doesn't use mask    Uterine cancer ([place]) ?3664'Q   Past Surgical History:  Procedure Laterality Date   ABDOMINAL ANGIOGRAM  11/22/2014   Procedure: ABDOMINAL ANGIOGRAM;  Surgeon: Lorretta Harp, MD;  Location: [facility] CATH LAB;  Service: Cardiovascular;;   ABDOMINAL AORTOGRAM W/LOWER EXTREMITY N/A 10/03/2020   Procedure: ABDOMINAL AORTOGRAM W/LOWER EXTREMITY;  Surgeon: Lorretta Harp, MD;  Location: [name] CV LAB;  Service: Cardiovascular;  Laterality: N/A;   COLONOSCOPY  2000   negative   CORONARY ANGIOPLASTY WITH STENT PLACEMENT  1999   RCA; Dr. Melvern Banker G4 P4   ESOPHAGOGASTRODUODENOSCOPY     ILIAC ARTERY STENT Right 11/22/2014   LOWER EXTREMITY ANGIOGRAM N/A 11/22/2014   Procedure: LOWER EXTREMITY ANGIOGRAM;  Surgeon: Lorretta Harp, MD; L-SFA 100%, 3v runoff, R-pCIA 95>>0% w/ 8 mm x 18 mm long  Balloon expandable stent; R-dCIA 67>>0% w/ 8 mm x 4 cm long Cordis Smart Nitinol self  expanding stent    LOWER EXTREMITY ANGIOGRAPHY Bilateral 12/08/2020   Procedure: Lower Extremity Angiography;  Surgeon: Lorretta Harp, MD;  Location: [place] CV LAB;  Service: Cardiovascular;  Laterality: Bilateral;   PERIPHERAL VASCULAR ATHERECTOMY Left 12/08/2020   Procedure: PERIPHERAL VASCULAR ATHERECTOMY;  Surgeon: Lorretta Harp, MD;  Location: [place] CV LAB;  Service: Cardiovascular;  Laterality: Left;  common iliac   PERIPHERAL VASCULAR INTERVENTION Left 12/08/2020    Procedure: PERIPHERAL VASCULAR INTERVENTION;  Surgeon: Lorretta Harp, MD;  Location: [place] CV LAB;  Service: Cardiovascular;  Laterality: Left;  common iliac   RENAL ARTERY STENT Right May 2007; 08/2006   ; restenosis   TOTAL ABDOMINAL HYSTERECTOMY  ?0347'Q    for uterine CA   VISCERAL ARTERY INTERVENTION N/A 10/03/2020   Procedure: VISCERAL ARTERY INTERVENTION;  Surgeon: Lorretta Harp, MD;  Location: [place] CV LAB;  Service: Cardiovascular;  Laterality: N/A;  SMA   Family History  Problem Relation Age of Onset   Heart failure Mother        after hip fx.    Hypertension Mother    Diabetes Mother    Deep vein thrombosis Mother    Hypertension Father  Diabetes Sister        insulin dependent   Hypertension Brother    Emphysema Brother    Diabetes Brother        insulin dependent   Diabetes Maternal Grandmother        non-insulin dependent   Stroke Daughter    Heart attack Daughter    Breast cancer Daughter    Brain cancer Daughter    Breast cancer Daughter    Diabetes Maternal Aunt        non-insulent dependent   Heart attack Paternal Aunt    Social History   Socioeconomic History   Marital status: Single    Spouse name: Not on file   Number of children: 4   Years of education: Not on file   Highest education level: Not on file  Occupational History   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    Employer: [employer]  Tobacco Use   Smoking status: Every Day    Packs/day: 0.25    Years: 51.00    Total pack years: 12.75    Types: Cigarettes   Smokeless tobacco: Never   Tobacco comments:    currently smoking 2-3 cigs per day.  Not trying to quit.  07/10/2022 hfb  Vaping Use   Vaping Use: Never used  Substance and Sexual Activity   Alcohol use: No    Alcohol/week: 0.0 standard drinks of alcohol   Drug use: No   Sexual activity: Not Currently  Other Topics Concern   Not on file  Social History  Narrative      Daughter [name] is CMA Financial controller primary care [place]   Retired Risk analyst   Smoker, no EtOH/drugs   Social Determinants of Radio broadcast assistant Strain: Low Risk  (07/20/2021)   Overall Financial Resource Strain (CARDIA)    Difficulty of Paying Living Expenses: Not hard at all  Food Insecurity: No Food Insecurity (07/23/2022)   Hunger Vital Sign    Worried About Running Out of Food in the Last Year: Never true    [place] in the Last Year: Never true  Transportation Needs: No Transportation Needs (07/23/2022)   PRAPARE - Hydrologist (Medical): No    Lack of Transportation (Non-Medical): No  Physical Activity: Inactive (07/20/2021)   Exercise Vital Sign    Days of Exercise per Week: 0 days    Minutes of Exercise per Session: 0 min  Stress: No Stress Concern Present (07/20/2021)   [place]    Feeling of Stress : Not at all  Social Connections: Moderately Isolated (07/20/2021)   Social Connection and Isolation Panel [NHANES]    Frequency of Communication with Friends and Family: More than three times a week    Frequency of Social Gatherings with Friends and Family: More than three times a week    Attends Religious Services: More than 4 times per year    Active Member of [employer] or Organizations: No    Attends Archivist Meetings: Never    Marital Status: Never married    Tobacco Counseling Ready to quit: Not Answered Counseling given: Not Answered Tobacco comments: currently smoking 2-3 cigs per day.  Not trying to quit.  07/10/2022 hfb   Clinical Intake:  Pre-visit preparation completed: Yes  Pain : No/denies pain  How often do you need to have someone help you when you read  instructions, pamphlets, or other written materials from your doctor or pharmacy?: 1 - Never  Diabetic? Nutrition Risk Assessment:  Has the patient had any  N/V/D within the last 2 months?  No  Does the patient have any non-healing wounds?  No  Has the patient had any unintentional weight loss or weight gain?  No   Diabetes:  Is the patient diabetic?  Yes  If diabetic, was a CBG obtained today?  No  Did the patient bring in their glucometer from home?   Audio visit How often do you monitor your CBG's? Once a month.   Financial Strains and Diabetes Management:  Are you having any financial strains with the device, your supplies or your medication? No .  Does the patient want to be seen by Chronic Care Management for management of their diabetes?  No  Would the patient like to be referred to a Nutritionist or for Diabetic Management?  No   Diabetic Exams:  Diabetic Eye Exam: Overdue for diabetic eye exam. Pt has been advised about the importance in completing this exam. Patient advised to call and schedule an eye exam. Diabetic Foot Exam: Overdue, Pt has been advised about the importance in completing this exam. Pt is scheduled for diabetic foot exam on N/a.   Interpreter Needed?: No  Information entered by :: Beatris Ship, [place]   Activities of Daily Living    07/23/2022    9:45 AM  In your present state of health, do you have any difficulty performing the following activities:  Hearing? 0  Vision? 0  Difficulty concentrating or making decisions? 0  Walking or climbing stairs? 1  Comment gets short of breath  Dressing or bathing? 0  Doing errands, shopping? 0  Preparing Food and eating ? N  Using the Toilet? N  In the past six months, have you accidently leaked urine? N  Do you have problems with loss of bowel control? N  Managing your Medications? N  Managing your Finances? N  Housekeeping or managing your Housekeeping? N    Patient Care Team: Carollee Herter, Alferd Apa, DO as PCP - General (Family Medicine) Lorretta Harp, MD as PCP - Cardiology (Cardiology) Calvert Cantor, MD as Consulting Physician  (Ophthalmology) Juanito Doom, MD as Consulting Physician (Pulmonary Disease) Lorretta Harp, MD as Consulting Physician (Cardiology)  Indicate any recent Medical Services you may have received from other than [facility] providers in the past year (date may be approximate).     Assessment:   This is a routine wellness examination for Dareen.  Hearing/Vision screen No results found.  Dietary issues and exercise activities discussed: Current Exercise Habits: The patient does not participate in regular exercise at present, Exercise limited by: respiratory conditions(s)   Goals Addressed   None    Depression Screen    07/23/2022    9:45 AM 01/23/2022   10:48 AM 07/20/2021    9:10 AM 01/09/2021    9:16 AM 07/23/2019    9:43 AM 01/24/2018    3:08 PM 10/15/2016   10:23 AM  PHQ 2/9 Scores  PHQ - 2 Score 0 1 0 0 0 0 0    Fall Risk    07/23/2022    9:42 AM 01/23/2022   10:48 AM 07/20/2021    9:07 AM 01/21/2020    9:01 AM 01/24/2018    3:08 PM  Fall Risk   Falls in the past year? 0 0 0 0 No  Number falls in past yr: 0  0 0 0   Injury with Fall? 0 0 0 0   Risk for fall due to : No Fall Risks      Follow up Falls evaluation completed Falls evaluation completed Falls prevention discussed Falls evaluation completed     [place]:  Any stairs in or around the home? No  If so, are there any without handrails?  No stairs Home free of loose throw rugs in walkways, pet beds, electrical cords, etc? Yes  Adequate lighting in your home to reduce risk of falls? Yes   ASSISTIVE DEVICES UTILIZED TO PREVENT FALLS:  Life alert? No  Use of a cane, walker or w/c? No  Grab bars in the bathroom? No  Shower chair or bench in shower? Yes  Elevated toilet seat or a handicapped toilet? No   TIMED UP AND GO:  Was the test performed?  No, audio visit .    Cognitive Function:    10/15/2016   10:50 AM  MMSE - Mini Mental State Exam  Orientation to time 5   Orientation to Place 5  Registration 3  Attention/ Calculation 4  Recall 3  Language- name 2 objects 2  Language- repeat 1  Language- follow 3 step command 3  Language- read & follow direction 1  Write a sentence 1  Copy design 0  Total score 28        07/23/2022    9:50 AM  6CIT Screen  What Year? 0 points  What month? 3 points  What time? 0 points  Count back from 20 0 points  Months in reverse 0 points  Repeat phrase 2 points  Total Score 5 points    Immunizations Immunization History  Administered Date(s) Administered   Fluad Quad(high Dose 65+) 07/07/2020, 07/10/2022   Influenza, High Dose Seasonal PF 06/25/2017, 06/26/2018, 06/08/2019, 06/23/2021   PFIZER Comirnaty(Gray Top)Covid-19 Tri-Sucrose Vaccine 01/06/2021   PFIZER(Purple Top)SARS-COV-2 Vaccination 11/03/2019, 11/24/2019   Pneumococcal Conjugate-13 09/06/2014   Pneumococcal Polysaccharide-23 09/08/2015, 01/21/2020   Td 11/09/2003    TDAP status: Due, Education has been provided regarding the importance of this vaccine. Advised may receive this vaccine at local pharmacy or Health Dept. Aware to provide a copy of the vaccination record if obtained from local pharmacy or Health Dept. Verbalized acceptance and understanding.  Flu Vaccine status: Up to date  Pneumococcal vaccine status: Up to date  Covid-19 vaccine status: Information provided on how to obtain vaccines.   Qualifies for Shingles Vaccine? Yes   Zostavax completed No   Shingrix Completed?: No.    Education has been provided regarding the importance of this vaccine. Patient has been advised to call insurance company to determine out of pocket expense if they have not yet received this vaccine. Advised may also receive vaccine at local pharmacy or Health Dept. Verbalized acceptance and understanding.  Screening Tests Health Maintenance  Topic Date Due   Zoster Vaccines- Shingrix (1 of 2) Never done   TETANUS/TDAP  11/08/2013   OPHTHALMOLOGY  EXAM  01/19/2021   COVID-19 Vaccine (4 - Pfizer series) 03/03/2021   FOOT EXAM  05/01/2022   Medicare Annual Wellness (AWV)  07/20/2022   HEMOGLOBIN A1C  07/26/2022   Diabetic kidney evaluation - Urine ACR  01/24/2023   Diabetic kidney evaluation - GFR measurement  07/11/2023   MAMMOGRAM  02/21/2024   COLONOSCOPY (Pts 45-85yr Insurance coverage will need to be confirmed)  02/19/2028   Pneumonia Vaccine 71 Years old  Completed  INFLUENZA VACCINE  Completed   DEXA SCAN  Completed   Hepatitis C Screening  Completed   HPV VACCINES  Aged Out    Health Maintenance  Health Maintenance Due  Topic Date Due   Zoster Vaccines- Shingrix (1 of 2) Never done   TETANUS/TDAP  11/08/2013   OPHTHALMOLOGY EXAM  01/19/2021   COVID-19 Vaccine (4 - Pfizer series) 03/03/2021   FOOT EXAM  05/01/2022   Medicare Annual Wellness (AWV)  07/20/2022    Colorectal cancer screening: Type of screening: Colonoscopy. Completed 02/18/18. Repeat every 10 years  Mammogram status: Completed 02/20/22. Repeat every year  Bone Density status: Completed 01/25/20. Results reflect: Bone density results: OSTEOPENIA. Repeat every 2 years.  Lung Cancer Screening: (Low Dose CT Chest recommended if Age 56-80 years, 30 pack-year currently smoking OR have quit w/in 15years.) does not qualify.   Additional Screening:  Hepatitis C Screening: does qualify; Completed 12/03/20  Vision Screening: Recommended annual ophthalmology exams for early detection of glaucoma and other disorders of the eye. Is the patient up to date with their annual eye exam?  No  Who is the provider or what is the name of the office in which the patient attends annual eye exams? Doesn't remember name If pt is not established with a provider, would they like to be referred to a provider to establish care? No .   Dental Screening: Recommended annual dental exams for proper oral hygiene  Community Resource Referral / Chronic Care Management: CRR required  this visit?  No   CCM required this visit?  No      Plan:     I have personally reviewed and noted the following in the patient's chart:   Medical and social history Use of alcohol, tobacco or illicit drugs  Current medications and supplements including opioid prescriptions. Patient is not currently taking opioid prescriptions. Functional ability and status Nutritional status Physical activity Advanced directives List of other physicians Hospitalizations, surgeries, and ER visits in previous 12 months Vitals Screenings to include cognitive, depression, and falls Referrals and appointments  In addition, I have reviewed and discussed with patient certain preventive protocols, quality metrics, and best practice recommendations. A written personalized care plan for preventive services as well as general preventive health recommendations were provided to patient.   Due to this being a telephonic visit, the after visit summary with patients personalized plan was offered to patient via mail or my-chart.  Per request, patient was mailed a copy of AVS.  Beatris Ship, [place]   07/23/2022   Nurse Notes: None

## 2022-07-23 NOTE — Telephone Encounter (Signed)
Received fax from [facility] stating that "the quantity and/or directions for PIRFENIDONE TAB '267MG'$ , as the quantity of 270 TABS does not match the written directions: TAKE 3 TABLETS ('801MG'$ ) BY MOUTH THREE TIMES DAILY WITH FOOD".   Aside from the fact that the quantity does in fact match the directions that were noted on the fax, the most recent Rx sent in for this medication was actually written on 07/13/22 for 3 tabs Twice Daily with a quantity of 540 tablets and 1 refill.  Called [facility] and spoke with pharmacist Raquel Sarna who states that on 07/12/22 they received an Rx written for the twice daily dosing however the quantity was still listed as 270 tabs with 5 refills--hence the confusion. She states that they "never received a prescription on 11/3". I verified that the pt would be taking the medication twice daily and that we would like it to be dispensed for a quantity of 540 with 1 refill. She verbalized understanding and stated that she will make note of this on the pt's chart and process the prescription accordingly.  Nothing further needed at this time.

## 2022-07-25 ENCOUNTER — Ambulatory Visit: Payer: Medicare Other | Attending: Cardiovascular Disease | Admitting: Cardiovascular Disease

## 2022-07-25 ENCOUNTER — Encounter: Payer: Self-pay | Admitting: Cardiovascular Disease

## 2022-07-25 VITALS — BP 130/56 | HR 67 | Ht 62.0 in | Wt 106.8 lb

## 2022-07-25 DIAGNOSIS — K559 Vascular disorder of intestine, unspecified: Secondary | ICD-10-CM

## 2022-07-25 DIAGNOSIS — I701 Atherosclerosis of renal artery: Secondary | ICD-10-CM

## 2022-07-25 DIAGNOSIS — I2510 Atherosclerotic heart disease of native coronary artery without angina pectoris: Secondary | ICD-10-CM | POA: Diagnosis not present

## 2022-07-25 DIAGNOSIS — I739 Peripheral vascular disease, unspecified: Secondary | ICD-10-CM

## 2022-07-25 DIAGNOSIS — I10 Essential (primary) hypertension: Secondary | ICD-10-CM | POA: Diagnosis not present

## 2022-07-25 DIAGNOSIS — Z95828 Presence of other vascular implants and grafts: Secondary | ICD-10-CM

## 2022-07-25 DIAGNOSIS — I6523 Occlusion and stenosis of bilateral carotid arteries: Secondary | ICD-10-CM

## 2022-07-25 NOTE — Assessment & Plan Note (Signed)
History of essential hypertension blood pressure measured today at 130/56.  She is on metoprolol and Benicar.

## 2022-07-25 NOTE — Patient Instructions (Signed)
Medication Instructions:  Your physician recommends that you continue on your current medications as directed. Please refer to the Current Medication list given to you today.  *If you need a refill on your cardiac medications before your next appointment, please call your pharmacy*   Testing/Procedures: Your physician has requested that you have a carotid duplex. This test is an ultrasound of the carotid arteries in your neck. It looks at blood flow through these arteries that supply the brain with blood. Allow one hour for this exam. There are no restrictions or special instructions. This will take place at [place], [address].  Your physician has requested that you have an Aorta/Iliac Duplex. This will be take place at [place], [address].  No food after 11PM the night before.  Water is OK. (Don't drink liquids if you have been instructed not to for ANOTHER test) Avoid foods that produce bowel gas, for 24 hours prior to exam (see below). No breakfast, no chewing gum, no smoking or carbonated beverages. Patient may take morning medications with water. Come in for test at least 15 minutes early to register.  Your physician has requested that you have an ankle brachial index (ABI). During this test an ultrasound and blood pressure cuff are used to evaluate the arteries that supply the arms and legs with blood. Allow thirty minutes for this exam. There are no restrictions or special instructions. This will take place at [place], [address].   Your physician has requested that you have an Mesenteric Ultrasound. This will be take place at [place], [address].  No food after 11PM the night before.  Water is OK. (Don't drink liquids if you have been instructed not to for ANOTHER test). Avoid foods that produce bowel gas, for 24 hours prior to exam (see below). No breakfast, no chewing gum, no smoking or carbonated beverages. Patient may take morning  medications with water. You may have to drink Ensure during this test. Come in for test at least 15 minutes early to register.   All ultrasounds are to be done in October 2024.    Follow-Up: At [facility], you and your health needs are our priority.  As part of our continuing mission to provide you with exceptional heart care, we have created designated Provider Care Teams.  These Care Teams include your primary Cardiologist (physician) and Advanced Practice Providers (APPs -  Physician Assistants and Nurse Practitioners) who all work together to provide you with the care you need, when you need it.  We recommend signing up for the patient portal called "MyChart".  Sign up information is provided on this After Visit Summary.  MyChart is used to connect with patients for Virtual Visits (Telemedicine).  Patients are able to view lab/test results, encounter notes, upcoming appointments, etc.  Non-urgent messages can be sent to your provider as well.   To learn more about what you can do with MyChart, go to [email or web address].    Your next appointment:   12 month(s)  The format for your next appointment:   In Person  Provider:   Quay Burow, MD

## 2022-07-25 NOTE — Assessment & Plan Note (Signed)
History of renal artery stenosis status post remote right renal artery stenting.  Angiogram to her 11/22/2014 revealing occluded right renal artery stent.  We will repeat renal artery Doppler studies.

## 2022-07-25 NOTE — Assessment & Plan Note (Signed)
History of dyslipidemia on statin therapy with lipid profile performed 01/23/2022 revealing total cholesterol 150, LDL 75 and HDL 57.

## 2022-07-25 NOTE — Assessment & Plan Note (Signed)
History of CAD status post myocardial infarction back in 1999 having undergone RCA stenting by Dr. Melvern Banker at that time.  She has had no further coronary interventions.  She denies chest pain.

## 2022-07-25 NOTE — Assessment & Plan Note (Signed)
History of moderate bilateral ICA stenosis by duplex ultrasound performed 06/29/2022.  This will repeated on an annual basis.

## 2022-07-25 NOTE — Assessment & Plan Note (Signed)
History of PAD status post bilateral iliac stenting by myself most recently 12/08/2020.  She has known occluded SFAs bilaterally.  ABIs remained stable.  She denies claudication.

## 2022-07-25 NOTE — Progress Notes (Signed)
07/25/2022 Christine Meyer   1951/05/17  [id number]  Primary Physician Ann Held, DO Primary Cardiologist: Lorretta Harp MD [name], [place]  HPI:  Christine Meyer is a 71 y.o.   thin appearing single African-American female mother of 4 children . She was referred by Dr. Percival Spanish , her cardiologist,  for peripheral vascular evaluation. I last saw her in the office 07/28/2021.  She is accompanied by [name] today.  She has a history of myocardial infarction back in 1999 undergoing stenting of her RCA by Dr. Melvern Banker.her chronic risk factors are notable for continued tobacco abuse, treated hypertension and hyperlipidemia. She is complained of increasing dyspnea on exertion over the last 6 months as well as right calf claudication. Dr. Percival Spanish saw her  and ordered a stress test. 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 She has had right renal artery stenting in the past as well as right external iliac artery stenting as well. 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 I stented both of these areas. She did have an occluded left SFA with high-grade segmental diffuse right SFA stenosis. Her Dopplers and symptoms improved.   She had been complaining of some abdominal pain and is experiencing some weight loss.  She saw Dr. Carlean Purl, her gastroenterologist who obtained an abdominal CTA and referred her to Dr. Stanford Breed , vascular surgeon for consideration of peripheral angiography and treatment of mesenteric ischemia.  She had carotid Dopplers that showed moderate left ICA stenosis and lower extremity Dopplers performed today that suggested in-stent restenosis within her right iliac stent.   I performed angiography on her 10/03/2020 revealing patent right iliac stent,  total SFAs bilaterally, 90% calcified proximal left common iliac artery stenosis, subtotally occluded celiac axis at the origin and 90% SMA which I stented successfully.  She was discharged home the following day.  She did have a slight drop in her hemoglobin but no evidence of hematoma or retroperitoneal bleed.  Her follow-up Dopplers performed a week later showed a widely patent SMA.  Her abdominal pain resolved after the intervention and she had dinner in the hospital that evening without discomfort.  She states since gained 5 pounds.  She does complain of left lower extremity claudication however wishes to have this intervened on in the near future.   Since I saw her in the office a year ago she continues to do well.  She still smoking 3 cigarettes a day.  Her Doppler suggest moderate bilateral internal carotid artery stenoses, in-stent restenosis within the SMA stent, and stable ABIs.  She denies chest pain, shortness of breath, claudication or abdominal discomfort.   Current Meds  Medication Sig   ACCU-CHEK SOFTCLIX LANCETS lancets Check blood sugar once daily. Dx:E11.9   albuterol (PROVENTIL HFA) 108 (90 Base) MCG/ACT inhaler Inhale 1-2 puffs into the lungs every 6 (six) hours as needed for wheezing or shortness of breath.   albuterol (VENTOLIN HFA) 108 (90 Base) MCG/ACT inhaler Inhale 2 puffs into the lungs every 6 (six) hours as needed for wheezing or shortness of breath.   aspirin EC 81 MG tablet Take 1 tablet (81 mg total) by mouth daily.   atorvastatin (LIPITOR) 80 MG tablet TAKE 1 TABLET BY MOUTH ONCE  DAILY   Blood Glucose Monitoring Suppl (ONETOUCH VERIO) w/Device  KIT Use daily to check blood sugar.   DX E11.9   Calcium 500-100 MG-UNIT CHEW Chew 1 tablet by mouth daily before breakfast.   Cholecalciferol (VITAMIN D3) 50 MCG (2000 UT) capsule Take 1 capsule (2,000 Units total) by mouth daily.   clopidogrel (PLAVIX) 75 MG tablet TAKE 1 TABLET BY MOUTH DAILY   cyanocobalamin (,VITAMIN  B-12,) 1000 MCG/ML injection INJECT INTO THE MUSCLE EVERY 30 DAYS   FEROSUL 325 (65 Fe) MG tablet TAKE 1 TABLET BY MOUTH DAILY   glucose blood (ONETOUCH VERIO) test strip USE AS DIRECTED DAILY   metoprolol succinate (TOPROL-XL) 25 MG 24 hr tablet TAKE 1 TABLET BY MOUTH DAILY   Multiple Vitamins-Minerals (A THRU Z SELECT 50+ ADVANCED) TABS TAKE 1 TABLET BY MOUTH DAILY (Patient taking differently: Take 1 tablet by mouth daily.)   olmesartan (BENICAR) 20 MG tablet Take 1 tablet (20 mg total) by mouth daily.   pantoprazole (PROTONIX) 40 MG tablet TAKE 1 TABLET(40 MG) BY MOUTH DAILY BEFORE BREAKFAST   Pirfenidone 267 MG TABS TAKE 3 TABLETS (801MG) BY MOUTH  TWICE DAILY WITH FOOD   spironolactone (ALDACTONE) 25 MG tablet TAKE 1 TABLET BY MOUTH DAILY   STIOLTO RESPIMAT 2.5-2.5 MCG/ACT AERS USE 2 INHALATIONS BY MOUTH  DAILY   SYRINGE-NEEDLE, DISP, 3 ML (BD INTEGRA SYRINGE) 25G X 1" 3 ML MISC Use to inject b12 once a month.     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    Employer: [employer]  Tobacco Use   Smoking status: Every Day    Packs/day: 0.25    Years: 51.00    Total pack years: 12.75    Types: Cigarettes   Smokeless tobacco: Never   Tobacco comments:    currently smoking 2-3 cigs per day.  Not trying to quit.  07/10/2022 hfb  Vaping Use   Vaping Use: Never used  Substance and Sexual Activity   Alcohol use: No    Alcohol/week: 0.0 standard drinks of alcohol   Drug use: No   Sexual activity: Not Currently  Other Topics Concern   Not on file  Social History Narrative      Daughter [name] is CMA Financial controller primary care [place]   Retired Risk analyst   Smoker, no EtOH/drugs   Social Determinants  of Radio broadcast assistant Strain: Low Risk  (07/20/2021)   Overall Financial Resource Strain (CARDIA)    Difficulty of Paying Living Expenses: Not hard at all  Food Insecurity: No Food Insecurity (07/23/2022)   Hunger Vital Sign    Worried About Running Out of Food in the Last Year: Never true    [place] in the Last Year: Never true  Transportation Needs: No Transportation Needs (07/23/2022)   PRAPARE - Hydrologist (Medical): No    Lack of Transportation (Non-Medical): No  Physical Activity: Inactive (07/20/2021)   Exercise Vital Sign    Days of Exercise per Week: 0 days    Minutes of Exercise per Session: 0 min  Stress: No Stress Concern Present (07/20/2021)   [place]    Feeling of Stress :  Not at all  Social Connections: Moderately Isolated (07/20/2021)   Social Connection and Isolation Panel [NHANES]    Frequency of Communication with Friends and Family: More than three times a week    Frequency of Social Gatherings with Friends and Family: More than three times a week    Attends Religious Services: More than 4 times per year    Active Member of [employer] or Organizations: No    Attends Archivist Meetings: Never    Marital Status: Never married  Intimate Partner Violence: Not At Risk (07/20/2021)   Humiliation, Afraid, Rape, and Kick questionnaire    Fear of Current or Ex-Partner: No    Emotionally Abused: No    Physically Abused: No    Sexually Abused: No     Review of Systems: General: negative for chills, fever, night sweats or weight changes.  Cardiovascular: negative for chest pain, dyspnea on exertion, edema, orthopnea, palpitations, paroxysmal nocturnal dyspnea or shortness of breath Dermatological: negative for rash Respiratory: negative for cough or wheezing Urologic: negative for hematuria Abdominal: negative for nausea, vomiting, diarrhea,  bright red blood per rectum, melena, or hematemesis Neurologic: negative for visual changes, syncope, or dizziness All other systems reviewed and are otherwise negative except as noted above.    Blood pressure (!) 130/56, pulse 67, height _0  (1.575 m), weight 106 lb 12.8 oz (48.4 kg), SpO2 99 %.  General appearance: alert and no distress Neck: no adenopathy, no JVD, supple, symmetrical, trachea midline, thyroid not enlarged, symmetric, no tenderness/mass/nodules, and bilateral carotid bruits Lungs: clear to auscultation bilaterally Heart: regular rate and rhythm, S1, S2 normal, no murmur, click, rub or gallop Extremities: extremities normal, atraumatic, no cyanosis or edema Pulses: 2+ and symmetric Skin: Skin color, texture, turgor normal. No rashes or lesions Neurologic: Grossly normal  EKG sinus rhythm at 67 without ST or T wave changes.  Personally reviewed this EKG.  ASSESSMENT AND PLAN:   Essential hypertension History of essential hypertension blood pressure measured today at 130/56.  She is on metoprolol and Benicar.  CAROTID BRUITS, BILATERAL History of moderate bilateral ICA stenosis by duplex ultrasound performed 06/29/2022.  This will repeated on an annual basis.  Coronary atherosclerosis History of CAD status post myocardial infarction back in 1999 having undergone RCA stenting by Dr. Melvern Banker at that time.  She has had no further coronary interventions.  She denies chest pain.  Dyslipidemia History of dyslipidemia on statin therapy with lipid profile performed 01/23/2022 revealing total cholesterol 150, LDL 75 and HDL 57.  Peripheral arterial disease (HCC) History of PAD status post bilateral iliac stenting by myself most recently 12/08/2020.  She has known occluded SFAs bilaterally.  ABIs remained stable.  She denies claudication.  Renal artery stenosis, native, bilateral (HCC) History of renal artery stenosis status post remote right renal artery stenting.  Angiogram  to her 11/22/2014 revealing occluded right renal artery stent.  We will repeat renal artery Doppler studies.  Mesenteric ischemia (HCC) History of mesenteric artery ischemia status post SMA stenting by myself 09/29/2020 with resolution of her abdominal angina and subsequent weight gain.  Most recent Doppler studies performed 06/29/2022 does show high-grade SMA "in-stent restenosis however she denies abdominal angina.  We will continue to follow her noninvasively.     Lorretta Harp MD FACP,FACC,FAHA, [facility] 07/25/2022 9:32 AM

## 2022-07-25 NOTE — Assessment & Plan Note (Signed)
History of mesenteric artery ischemia status post SMA stenting by myself 09/29/2020 with resolution of her abdominal angina and subsequent weight gain.  Most recent Doppler studies performed 06/29/2022 does show high-grade SMA "in-stent restenosis however she denies abdominal angina.  We will continue to follow her noninvasively.

## 2022-08-08 ENCOUNTER — Other Ambulatory Visit: Payer: Self-pay | Admitting: Family Medicine

## 2022-08-10 ENCOUNTER — Other Ambulatory Visit: Payer: Self-pay | Admitting: Cardiovascular Disease

## 2022-08-10 DIAGNOSIS — E785 Hyperlipidemia, unspecified: Secondary | ICD-10-CM

## 2022-10-05 ENCOUNTER — Telehealth: Payer: Self-pay | Admitting: Family Medicine

## 2022-10-05 ENCOUNTER — Other Ambulatory Visit: Payer: Self-pay | Admitting: Family Medicine

## 2022-10-05 MED ORDER — SUCRALFATE 1 G PO TABS: 1.0000 g | ORAL_TABLET | Freq: Three times a day (TID) | ORAL | 1 refills | Status: AC

## 2022-10-05 NOTE — Telephone Encounter (Signed)
Pt had a [place] visit yesterday and they recommended she call her PCP to send in some sulcrofate to assist with her appetite as she is currently at 100lbs.

## 2022-10-05 NOTE — Telephone Encounter (Signed)
Pt called and scheduled

## 2022-10-05 NOTE — Telephone Encounter (Signed)
Pt really needs appointment.

## 2022-10-07 ENCOUNTER — Other Ambulatory Visit: Payer: Self-pay | Admitting: Family Medicine

## 2022-10-07 DIAGNOSIS — I10 Essential (primary) hypertension: Secondary | ICD-10-CM

## 2022-10-08 ENCOUNTER — Ambulatory Visit (INDEPENDENT_AMBULATORY_CARE_PROVIDER_SITE_OTHER): Payer: 59 | Admitting: Family Medicine

## 2022-10-08 ENCOUNTER — Encounter: Payer: Self-pay | Admitting: Family Medicine

## 2022-10-08 VITALS — BP 110/60 | HR 83 | Temp 97.7°F | Resp 18 | Ht 62.0 in | Wt 103.6 lb

## 2022-10-08 DIAGNOSIS — R634 Abnormal weight loss: Secondary | ICD-10-CM | POA: Diagnosis not present

## 2022-10-08 DIAGNOSIS — F17210 Nicotine dependence, cigarettes, uncomplicated: Secondary | ICD-10-CM | POA: Diagnosis not present

## 2022-10-08 DIAGNOSIS — E785 Hyperlipidemia, unspecified: Secondary | ICD-10-CM | POA: Diagnosis not present

## 2022-10-08 DIAGNOSIS — R6881 Early satiety: Secondary | ICD-10-CM

## 2022-10-08 DIAGNOSIS — F17200 Nicotine dependence, unspecified, uncomplicated: Secondary | ICD-10-CM

## 2022-10-08 MED ORDER — ATORVASTATIN CALCIUM 80 MG PO TABS: 80.0000 mg | ORAL_TABLET | Freq: Every day | ORAL | 2 refills | Status: DC

## 2022-10-08 NOTE — Progress Notes (Signed)
Subjective:   By signing my name below, I, Eugene Gavia, attest that this documentation has been prepared under the direction and in the presence of Ann Held, DO 10/08/22   Patient ID: Christine Meyer, female    DOB: [birth year], 72 y.o.   MRN: [id number]  Chief Complaint  Patient presents with  . Appetite     Pt states she only eats once a day. Pt states no appetite the rest of the day. Pt states drinking 2 cups of coffee in the morning and then eat lunch at 2 pm and doesn't eat dinner. Pt states she gets full really quick. Pt states she will drink Ensure sometimes    HPI Patient is in today for loss of appetite. She drinks 1-2 Ensure bottles per day. Her largest meal of the day is typically at dinner time. She will typically eat a piece of meat with mashed potatoes or greens. However, she states that she feels full after eating just x2-3 spoonfuls of food. She will try to eat potato chips or peanut butter crackers before going to bed at night. She denies any abdominal pain, heartburn, diarrhea, constipation, nausea, or vomiting. Wt Readings from Last 10 Encounters:  10/08/22 103 lb 9.6 oz (47 kg)  07/25/22 106 lb 12.8 oz (48.4 kg)  07/10/22 108 lb 12.8 oz (49.4 kg)  06/21/22 107 lb 8 oz (48.8 kg)  04/09/22 106 lb (48.1 kg)  03/01/22 106 lb (48.1 kg)  01/23/22 106 lb 3.2 oz (48.2 kg)  08/04/21 120 lb 13 oz (54.8 kg)  07/28/21 120 lb 12.8 oz (54.8 kg)  07/20/21 121 lb (54.9 kg)    She reports some worsening of her DOE. This resolves after sitting down to rest. Afterward she is able to get back up and continue her activities. She is planning to see her pulmonologist Dr. Marshell Garfinkel next month. She saw her cardiologist Dr. Quay Burow on 07/25/22. She is still smoking. She has never had a lung cancer screening CT.  Past Medical History:  Diagnosis Date  . Anemia    NOS  . Anginal pain ([place])   . 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 CAD  (coronary artery disease)    Stent RCA 1999  . Chronic kidney disease   . COPD (chronic obstructive pulmonary disease) ([place])   . Diabetes mellitus, type 2 (HCC)    Diet controlled  . Gastric polyps - hyperplastic 02/23/2018  . Hyperlipidemia   . Hypertension   . OSA (obstructive sleep apnea) [name]   . PVD (peripheral vascular disease) (HCC)    Carotid stenosis, renal artery stenosis  . Sleep apnea    dx'd but doesn't use mask   . Uterine cancer ([place]) ?4481'[id number]    Past Surgical History:  Procedure Laterality Date  . ABDOMINAL ANGIOGRAM  11/22/2014   Procedure: ABDOMINAL ANGIOGRAM;  Surgeon: Lorretta Harp, MD;  Location: [facility] CATH LAB;  Service: Cardiovascular;;  . ABDOMINAL AORTOGRAM W/LOWER EXTREMITY N/A 10/03/2020   Procedure: ABDOMINAL AORTOGRAM W/LOWER EXTREMITY;  Surgeon: Lorretta Harp, MD;  Location: [place] CV LAB;  Service: Cardiovascular;  Laterality: N/A;  . COLONOSCOPY  2000   negative  . CORONARY ANGIOPLASTY WITH STENT PLACEMENT  1999   RCA; Dr. Melvern Banker G4 P4  . ESOPHAGOGASTRODUODENOSCOPY    . ILIAC ARTERY STENT Right 11/22/2014  . LOWER EXTREMITY ANGIOGRAM N/A 11/22/2014   Procedure: LOWER EXTREMITY ANGIOGRAM;  Surgeon: Lorretta Harp, MD; L-SFA 100%, 3v runoff, R-pCIA  95>>0% w/ 8 mm x 18 mm long  Balloon expandable stent; R-dCIA 67>>0% w/ 8 mm x 4 cm long Cordis Smart Nitinol self  expanding stent   . LOWER EXTREMITY ANGIOGRAPHY Bilateral 12/08/2020   Procedure: Lower Extremity Angiography;  Surgeon: Lorretta Harp, MD;  Location: [place] CV LAB;  Service: Cardiovascular;  Laterality: Bilateral;  . PERIPHERAL VASCULAR ATHERECTOMY Left 12/08/2020   Procedure: PERIPHERAL VASCULAR ATHERECTOMY;  Surgeon: Lorretta Harp, MD;  Location: [place] CV LAB;  Service: Cardiovascular;  Laterality: Left;  common iliac  . PERIPHERAL VASCULAR INTERVENTION Left 12/08/2020   Procedure: PERIPHERAL VASCULAR INTERVENTION;  Surgeon: Lorretta Harp, MD;  Location: [place] CV LAB;  Service: Cardiovascular;  Laterality: Left;  common iliac  . RENAL ARTERY STENT Right May 2007; 08/2006   ; restenosis  . TOTAL ABDOMINAL HYSTERECTOMY  ?1980's    for uterine CA  . VISCERAL ARTERY INTERVENTION N/A 10/03/2020   Procedure: VISCERAL ARTERY INTERVENTION;  Surgeon: Lorretta Harp, MD;  Location: [place] CV LAB;  Service: Cardiovascular;  Laterality: N/A;  SMA    Family History  Problem Relation Age of Onset  . Heart failure Mother        after hip fx.   . Hypertension Mother   . Diabetes Mother   . Deep vein thrombosis Mother   . Hypertension Father   . Diabetes Sister        insulin dependent  . Hypertension Brother   . Emphysema Brother   . Diabetes Brother        insulin dependent  . Diabetes Maternal Grandmother        non-insulin dependent  . Stroke Daughter   . Heart attack Daughter   . Breast cancer Daughter   . Brain cancer Daughter   . Breast cancer Daughter   . Diabetes Maternal Aunt        non-insulent dependent  . Heart attack Paternal Aunt     Social History   Socioeconomic History  . Marital status: Single    Spouse name: Not on file  . Number of children: 4  . Years of education: Not on file  . Highest education level: Not on file  Occupational History  . 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    Employer: [employer]  Tobacco Use  . Smoking status: Every Day    Packs/day: 0.25    Years: 51.00    Total pack years: 12.75    Types: Cigarettes  . Smokeless tobacco: Never  . Tobacco comments:    currently smoking 2-3 cigs per day.  Not trying to quit.  07/10/2022 hfb  Vaping Use  . Vaping Use: Never used  Substance and Sexual Activity  . Alcohol use: No    Alcohol/week: 0.0 standard drinks of alcohol  . Drug use: No  . Sexual activity: Not Currently  Other Topics Concern  . Not on file  Social History Narrative      Daughter [name] is CMA Financial controller primary care  [place]   Retired Risk analyst   Smoker, no EtOH/drugs   Social Determinants of Health   Financial Resource Strain: Low Risk  (07/20/2021)   Overall Financial Resource Strain (CARDIA)   . Difficulty of Paying Living Expenses: Not hard at all  Food Insecurity: No Food Insecurity (07/23/2022)   Hunger Vital Sign   . Worried About Charity fundraiser in the Last Year: Never true   .  Ran Out of Food in the Last Year: Never true  Transportation Needs: No Transportation Needs (07/23/2022)   PRAPARE - Transportation   . Lack of Transportation (Medical): No   . Lack of Transportation (Non-Medical): No  Physical Activity: Inactive (07/20/2021)   Exercise Vital Sign   . Days of Exercise per Week: 0 days   . Minutes of Exercise per Session: 0 min  Stress: No Stress Concern Present (07/20/2021)   [place]   . Feeling of Stress : Not at all  Social Connections: Moderately Isolated (07/20/2021)   Social Connection and Isolation Panel [NHANES]   . Frequency of Communication with Friends and Family: More than three times a week   . Frequency of Social Gatherings with Friends and Family: More than three times a week   . Attends Religious Services: More than 4 times per year   . Active Member of Clubs or Organizations: No   . Attends Archivist Meetings: Never   . Marital Status: Never married  Intimate Partner Violence: Not At Risk (07/20/2021)   Humiliation, Afraid, Rape, and Kick questionnaire   . Fear of Current or Ex-Partner: No   . Emotionally Abused: No   . Physically Abused: No   . Sexually Abused: No    Outpatient Medications Prior to Visit  Medication Sig Dispense Refill  . ACCU-CHEK SOFTCLIX LANCETS lancets Check blood sugar once daily. Dx:E11.9 100 each 5  . aspirin EC 81 MG tablet Take 1 tablet (81 mg total) by mouth daily. 90 tablet 1  . Blood Glucose Monitoring Suppl (ONETOUCH VERIO) w/Device  KIT Use daily to check blood sugar.   DX E11.9 1 kit 0  . Calcium 500-100 MG-UNIT CHEW Chew 1 tablet by mouth daily before breakfast. 150 tablet 1  . Cholecalciferol (VITAMIN D3) 50 MCG (2000 UT) capsule Take 1 capsule (2,000 Units total) by mouth daily. 60 capsule 11  . clopidogrel (PLAVIX) 75 MG tablet TAKE 1 TABLET BY MOUTH DAILY 90 tablet 3  . cyanocobalamin (,VITAMIN B-12,) 1000 MCG/ML injection INJECT INTO THE MUSCLE EVERY 30 DAYS 30 mL 3  . FEROSUL 325 (65 Fe) MG tablet TAKE 1 TABLET BY MOUTH DAILY 30 tablet 5  . glucose blood (ONETOUCH VERIO) test strip USE AS DIRECTED DAILY 100 strip 12  . guaiFENesin (MUCINEX) 600 MG 12 hr tablet Take 1 tablet (600 mg total) by mouth 2 (two) times daily. 30 tablet 5  . loratadine (CLARITIN) 10 MG tablet Take 1 tablet (10 mg total) by mouth daily. 30 tablet 11  . metoprolol succinate (TOPROL-XL) 25 MG 24 hr tablet TAKE 1 TABLET BY MOUTH DAILY 90 tablet 3  . Multiple Vitamins-Minerals (A THRU Z SELECT 50+ ADVANCED) TABS TAKE 1 TABLET BY MOUTH DAILY (Patient taking differently: Take 1 tablet by mouth daily.) 220 tablet 1  . olmesartan (BENICAR) 20 MG tablet TAKE 1 TABLET BY MOUTH DAILY 90 tablet 1  . pantoprazole (PROTONIX) 40 MG tablet TAKE 1 TABLET(40 MG) BY MOUTH DAILY BEFORE BREAKFAST 90 tablet 3  . Pirfenidone 267 MG TABS TAKE 3 TABLETS ('801MG'$ ) BY MOUTH  TWICE DAILY WITH FOOD 540 tablet 1  . spironolactone (ALDACTONE) 25 MG tablet TAKE 1 TABLET BY MOUTH DAILY 90 tablet 1  . STIOLTO RESPIMAT 2.5-2.5 MCG/ACT AERS USE 2 INHALATIONS BY MOUTH  DAILY 12 g 3  . sucralfate (CARAFATE) 1 g tablet Take 1 tablet (1 g total) by mouth 4 (four) times daily -  with meals and at bedtime. 120 tablet 1  . SYRINGE-NEEDLE, DISP, 3 ML (BD INTEGRA SYRINGE) 25G X 1" 3 ML MISC Use to inject b12 once a month. 12 each 1  . atorvastatin (LIPITOR) 80 MG tablet TAKE 1 TABLET BY MOUTH ONCE  DAILY 100 tablet 2  . albuterol (PROVENTIL HFA) 108 (90 Base) MCG/ACT inhaler Inhale 1-2  puffs into the lungs every 6 (six) hours as needed for wheezing or shortness of breath. (Patient not taking: Reported on 10/08/2022)    . albuterol (VENTOLIN HFA) 108 (90 Base) MCG/ACT inhaler Inhale 2 puffs into the lungs every 6 (six) hours as needed for wheezing or shortness of breath. (Patient not taking: Reported on 10/08/2022) 8 g 6   No facility-administered medications prior to visit.    No Known Allergies  Review of Systems  Constitutional:  Negative for fever.       + decreased appetite  HENT:  Negative for congestion, sinus pain and sore throat.   Respiratory:  Positive for shortness of breath. Negative for cough and wheezing.   Cardiovascular:  Negative for chest pain and palpitations.  Gastrointestinal:  Negative for abdominal pain, constipation, diarrhea, nausea and vomiting.  Genitourinary:  Negative for dysuria, frequency and hematuria.  Neurological:  Negative for headaches.       Objective:    Physical Exam Vitals and nursing note reviewed.  Constitutional:      General: She is not in acute distress.    Appearance: Normal appearance. She is not ill-appearing.  HENT:     Head: Normocephalic and atraumatic.     Right Ear: External ear normal.     Left Ear: External ear normal.  Eyes:     Extraocular Movements: Extraocular movements intact.     Pupils: Pupils are equal, round, and reactive to light.  Cardiovascular:     Rate and Rhythm: Normal rate and regular rhythm.     Heart sounds: Normal heart sounds. No murmur heard.    No gallop.  Pulmonary:     Effort: Pulmonary effort is normal. No respiratory distress.     Breath sounds: Normal breath sounds. No wheezing or rales.  Skin:    General: Skin is warm and dry.  Neurological:     Mental Status: She is alert and oriented to person, place, and time.  Psychiatric:        Judgment: Judgment normal.    BP 110/60 (BP Location: Right Arm, Patient Position: Sitting, Cuff Size: Small)   Pulse 83   Temp 97.7  F (36.5 C) (Oral)   Resp 18   Ht '5\' 2"'$  (1.575 m)   Wt 103 lb 9.6 oz (47 kg)   SpO2 100%   BMI 18.95 kg/m  Wt Readings from Last 3 Encounters:  10/08/22 103 lb 9.6 oz (47 kg)  07/25/22 106 lb 12.8 oz (48.4 kg)  07/10/22 108 lb 12.8 oz (49.4 kg)       Assessment & Plan:  Smoker -     Ambulatory Referral for Lung Cancer Scre  Hyperlipidemia LDL goal <70 -     Atorvastatin Calcium; Take 1 tablet (80 mg total) by mouth daily.  Dispense: 100 tablet; Refill: 2 -     Comprehensive metabolic panel -     Lipid panel  Weight loss -     CT ABDOMEN PELVIS W CONTRAST; Future -     CBC with Differential/Platelet -     Comprehensive metabolic panel -     Lipid  panel -     TSH -     Albumin  Early satiety -     CT ABDOMEN PELVIS W CONTRAST; Future -     CBC with Differential/Platelet -     Comprehensive metabolic panel -     Lipid panel -     TSH -     Albumin     I,Alexis Herring,acting as a scribe for Ann Held, DO.,have documented all relevant documentation on the behalf of Ann Held, DO,as directed by  Ann Held, DO while in the presence of Ann Held, DO.   I, Eugene Gavia, personally preformed the services described in this documentation.  All medical record entries made by the scribe were at my direction and in my presence.  I have reviewed the chart and discharge instructions (if applicable) and agree that the record reflects my personal performance and is accurate and complete. 10/08/22   Eugene Gavia

## 2022-10-08 NOTE — Patient Instructions (Signed)
High-Protein and High-Calorie Diet Eating high-protein and high-calorie foods can help you to gain weight, heal after an injury, and recover after an illness or surgery. The specific amount of daily protein and calories you need depends on: Your body weight. The reason this diet is recommended for you. Generally, a high-protein, high-calorie diet involves: Eating 250-500 extra calories each day. Making sure that you get enough of your daily calories from protein. Ask your health care provider how many of your calories should come from protein. Talk with a health care provider or a dietitian about how much protein and how many calories you need each day. Follow the diet as directed by your health care provider. What are tips for following this plan?  Reading food labels Check the nutrition facts label for calories, grams of fat and protein. Items with more than 4 grams of protein are high-protein foods. Preparing meals Add whole milk, half-and-half, or heavy cream to cereal, pudding, soup, or hot cocoa. Add whole milk to instant breakfast drinks. Add peanut butter to oatmeal or smoothies. Add powdered milk to baked goods, smoothies, or milkshakes. Add powdered milk, cream, or butter to mashed potatoes. Add cheese to cooked vegetables. Make whole-milk yogurt parfaits. Top them with granola, fruit, or nuts. Add cottage cheese to fruit. Add avocado, cheese, or both to sandwiches or salads. Add avocado to smoothies. Add meat, poultry, or seafood to rice, pasta, casseroles, salads, and soups. Use mayonnaise when making egg salad, chicken salad, or tuna salad. Use peanut butter as a dip for fruits and vegetables or as a topping for pretzels, celery, or crackers. Add beans to casseroles, dips, and spreads. Add pureed beans to sauces and soups. Replace calorie-free drinks with calorie-containing drinks, such as milk and fruit juice. Replace water with milk or heavy cream when making foods such as  oatmeal, pudding, or cocoa. Add oil or butter to cooked vegetables and grains. Add cream cheese to sandwiches or as a topping on crackers and bread. Make cream-based pastas and soups. General information Ask your health care provider if you should take a nutritional supplement. Try to eat six small meals each day instead of three large meals. A general goal is to eat every 2 to 3 hours. Eat a balanced diet. In each meal, include one food that is high in protein and one food with fat in it. Keep nutritious snacks available, such as nuts, trail mixes, dried fruit, and yogurt. If you have kidney disease or diabetes, talk with your health care provider about how much protein is safe for you. Too much protein may put extra stress on your kidneys. Drink your calories. Choose high-calorie drinks and have them after your meals. Consider setting a timer to remind you to eat. You will want to eat even if you do not feel very hungry. What high-protein foods should I eat?  Vegetables Soybeans. Peas. Grains Quinoa. Bulgur wheat. Buckwheat. Meats and other proteins Beef, pork, and poultry. Fish and seafood. Eggs. Tofu. Textured vegetable protein (TVP). Peanut butter. Nuts and seeds. Dried beans. Protein powders. Hummus. Dairy Whole milk. Whole-milk yogurt. Powdered milk. Cheese. Yahoo. Eggnog. Beverages High-protein supplement drinks. Soy milk. Other foods Protein bars. The items listed above may not be a complete list of foods and beverages you can eat and drink. Contact a dietitian for more information. What high-calorie foods should I eat? Fruits Dried fruit. Fruit leather. Canned fruit in syrup. Fruit juice. Avocado. Vegetables Vegetables cooked in oil or butter. Fried potatoes. Grains  Pasta. Quick breads. Muffins. Pancakes. Ready-to-eat cereal. Meats and other proteins Peanut butter. Nuts and seeds. Dairy Heavy cream. Whipped cream. Cream cheese. Sour cream. Ice cream. Custard.  Pudding. Whole milk dairy products. Beverages Meal-replacement beverages. Nutrition shakes. Fruit juice. Seasonings and condiments Salad dressing. Mayonnaise. Alfredo sauce. Fruit preserves or jelly. Honey. Syrup. Sweets and desserts Cake. Cookies. Pie. Pastries. Candy bars. Chocolate. Fats and oils Butter or margarine. Oil. Gravy. Other foods Meal-replacement bars. The items listed above may not be a complete list of foods and beverages you can eat and drink. Contact a dietitian for more information. Summary A high-protein, high-calorie diet can help you gain weight or heal faster after an injury, illness, or surgery. To increase your protein and calories, add ingredients such as whole milk, peanut butter, cheese, beans, meat, or seafood to meal items. To get enough extra calories each day, include high-calorie foods and beverages at each meal. Adding a high-calorie drink or shake can be an easy way to help you get enough calories each day. Talk with your healthcare provider or dietitian about the best options for you. This information is not intended to replace advice given to you by your health care provider. Make sure you discuss any questions you have with your health care provider. Document Revised: 07/31/2020 Document Reviewed: 07/31/2020 Elsevier Patient Education  [employer].

## 2022-10-09 DIAGNOSIS — R6881 Early satiety: Secondary | ICD-10-CM | POA: Insufficient documentation

## 2022-10-09 LAB — COMPREHENSIVE METABOLIC PANEL
ALT: 15 U/L (ref 0–35)
AST: 16 U/L (ref 0–37)
Albumin: 4.1 g/dL (ref 3.5–5.2)
Alkaline Phosphatase: 61 U/L (ref 39–117)
BUN: 13 mg/dL (ref 6–23)
CO2: 25 mEq/L (ref 19–32)
Calcium: 9.4 mg/dL (ref 8.4–10.5)
Chloride: 104 mEq/L (ref 96–112)
Creatinine, Ser: 1.16 mg/dL (ref 0.40–1.20)
GFR: 46.76 mL/min — ABNORMAL LOW (ref >60.00)
Glucose, Bld: 118 mg/dL — ABNORMAL HIGH (ref 70–99)
Potassium: 3.7 mEq/L (ref 3.5–5.1)
Sodium: 138 mEq/L (ref 135–145)
Total Bilirubin: 0.4 mg/dL (ref 0.2–1.2)
Total Protein: 6.6 g/dL (ref 6.0–8.3)

## 2022-10-09 LAB — CBC WITH DIFFERENTIAL/PLATELET
Basophils Absolute: 0.1 10*3/uL (ref 0.0–0.1)
Basophils Relative: 0.9 % (ref 0.0–3.0)
Eosinophils Absolute: 0.1 10*3/uL (ref 0.0–0.7)
Eosinophils Relative: 2.3 % (ref 0.0–5.0)
HCT: 33.2 % — ABNORMAL LOW (ref 36.0–46.0)
Hemoglobin: 11.3 g/dL — ABNORMAL LOW (ref 12.0–15.0)
Lymphocytes Relative: 29.1 % (ref 12.0–46.0)
Lymphs Abs: 1.7 10*3/uL (ref 0.7–4.0)
MCHC: 33.9 g/dL (ref 30.0–36.0)
MCV: 89.5 fl (ref 78.0–100.0)
Monocytes Absolute: 0.4 10*3/uL (ref 0.1–1.0)
Monocytes Relative: 7.2 % (ref 3.0–12.0)
Neutro Abs: 3.5 10*3/uL (ref 1.4–7.7)
Neutrophils Relative %: 60.5 % (ref 43.0–77.0)
Platelets: 217 10*3/uL (ref 150.0–400.0)
RBC: 3.71 Mil/uL — ABNORMAL LOW (ref 3.87–5.11)
RDW: 14.8 % (ref 11.5–15.5)
WBC: 5.7 10*3/uL (ref 4.0–10.5)

## 2022-10-09 LAB — TSH: TSH: 2.18 u[IU]/mL (ref 0.35–5.50)

## 2022-10-09 LAB — LIPID PANEL
Cholesterol: 137 mg/dL (ref 0–200)
HDL: 54.7 mg/dL (ref >39.00)
LDL Cholesterol: 66 mg/dL (ref 0–99)
NonHDL: 82.06
Total CHOL/HDL Ratio: 3
Triglycerides: 82 mg/dL (ref 0.0–149.0)
VLDL: 16.4 mg/dL (ref 0.0–40.0)

## 2022-10-09 LAB — ALBUMIN: Albumin: 4.1 g/dL (ref 3.5–5.2)

## 2022-10-09 NOTE — Assessment & Plan Note (Signed)
F/u GI  Check CT

## 2022-10-09 NOTE — Assessment & Plan Note (Signed)
Drink boost / ensure  Weight check 1 month

## 2022-10-09 NOTE — Assessment & Plan Note (Signed)
Refer for lung cancer screening

## 2022-10-10 ENCOUNTER — Other Ambulatory Visit: Payer: Self-pay | Admitting: Family Medicine

## 2022-10-10 DIAGNOSIS — I10 Essential (primary) hypertension: Secondary | ICD-10-CM

## 2022-10-19 ENCOUNTER — Ambulatory Visit: Payer: Medicare Other | Admitting: Pulmonary Disease

## 2022-10-19 ENCOUNTER — Ambulatory Visit (HOSPITAL_BASED_OUTPATIENT_CLINIC_OR_DEPARTMENT_OTHER)
Admission: RE | Admit: 2022-10-19 | Discharge: 2022-10-19 | Disposition: A | Discharge status: Home / Self Care | Payer: 59 | Source: Ambulatory Visit | Attending: Family Medicine | Admitting: Family Medicine

## 2022-10-19 ENCOUNTER — Encounter (HOSPITAL_BASED_OUTPATIENT_CLINIC_OR_DEPARTMENT_OTHER): Payer: Self-pay

## 2022-10-19 DIAGNOSIS — R6881 Early satiety: Secondary | ICD-10-CM | POA: Diagnosis not present

## 2022-10-19 DIAGNOSIS — R634 Abnormal weight loss: Secondary | ICD-10-CM

## 2022-10-19 MED ORDER — IOHEXOL 300 MG/ML  SOLN
100.0000 mL | Freq: Once | INTRAMUSCULAR | Status: AC | PRN
  Administered 2022-10-19: 80 mL via INTRAVENOUS

## 2022-10-22 ENCOUNTER — Encounter: Payer: Self-pay | Admitting: Family Medicine

## 2022-10-22 ENCOUNTER — Other Ambulatory Visit: Payer: Self-pay | Admitting: Family Medicine

## 2022-10-22 DIAGNOSIS — R634 Abnormal weight loss: Secondary | ICD-10-CM

## 2022-10-22 MED ORDER — MIRTAZAPINE 7.5 MG PO TABS: 7.5000 mg | ORAL_TABLET | Freq: Every day | ORAL | 1 refills | Status: DC

## 2022-10-31 ENCOUNTER — Encounter: Payer: Self-pay | Admitting: Pulmonary Disease

## 2022-10-31 ENCOUNTER — Other Ambulatory Visit (HOSPITAL_BASED_OUTPATIENT_CLINIC_OR_DEPARTMENT_OTHER): Payer: Self-pay

## 2022-10-31 ENCOUNTER — Ambulatory Visit (INDEPENDENT_AMBULATORY_CARE_PROVIDER_SITE_OTHER): Payer: 59 | Admitting: Pulmonary Disease

## 2022-10-31 VITALS — BP 128/60 | HR 76 | Temp 97.7°F | Ht 63.0 in | Wt 102.6 lb

## 2022-10-31 DIAGNOSIS — J438 Other emphysema: Secondary | ICD-10-CM | POA: Diagnosis not present

## 2022-10-31 DIAGNOSIS — Z5181 Encounter for therapeutic drug level monitoring: Secondary | ICD-10-CM

## 2022-10-31 DIAGNOSIS — J84112 Idiopathic pulmonary fibrosis: Secondary | ICD-10-CM | POA: Diagnosis not present

## 2022-10-31 MED ORDER — ALBUTEROL SULFATE HFA 108 (90 BASE) MCG/ACT IN AERS: 1.0000 | INHALATION_SPRAY | Freq: Four times a day (QID) | RESPIRATORY_TRACT | 11 refills | Status: DC | PRN

## 2022-10-31 NOTE — Progress Notes (Signed)
Christine Meyer    [id number]    1951/03/30  Primary Care Physician:Lowne Cheri Rous Alferd Apa, DO  Referring Physician: Carollee Herter, Alferd Apa, DO [address] [place],   [id number]  Chief complaint: Follow-up for COPD, idiopathic pulmonary fibrosis  HPI: Mrs. Arispe was diagnosed with IPF in 2016 after CT scan showing UIP pulmonary fibrosis and centrilobular emphysema.  She has significant smoking history.  Evaluated by rheumatology in 2016 by Dr. Trudie Reed for mildly elevated rheumatoid factor with no evidence of connective tissue disease She is started on Esbriet in 2016 She was initially taking it twice a day due to fear of side effects.  Dose increased to full dose, 3 times a day in 2021 and she has tolerated it well.  Pets: Dogs, no cats, birds Occupation: Retired Training and development officer Exposures: No known exposures.  No mold, hot tub, Jacuzzi, no feather pillows or comforter Smoking history: 13-pack-year smoker.  Continues to smoke every day Travel history: No significant travel history Relevant family history: No significant family history of lung disease  Interim history: On Esbriet but she is only taking 3 pills once a day.  She does not like to take more than that as it causes loss of appetite. Continues on stiolto.  Continues to smoke   Outpatient Encounter Medications as of 10/31/2022  Medication Sig   ACCU-CHEK SOFTCLIX LANCETS lancets Check blood sugar once daily. Dx:E11.9   albuterol (PROVENTIL HFA) 108 (90 Base) MCG/ACT inhaler Inhale 1-2 puffs into the lungs every 6 (six) hours as needed for wheezing or shortness of breath.   aspirin EC 81 MG tablet Take 1 tablet (81 mg total) by mouth daily.   atorvastatin (LIPITOR) 80 MG tablet Take 1 tablet (80 mg total) by mouth daily.   Blood Glucose Monitoring Suppl (ONETOUCH VERIO) w/Device KIT Use daily to check blood sugar.   DX E11.9   Calcium 500-100 MG-UNIT CHEW Chew 1 tablet by mouth daily before breakfast.    Cholecalciferol (VITAMIN D3) 50 MCG (2000 UT) capsule Take 1 capsule (2,000 Units total) by mouth daily.   clopidogrel (PLAVIX) 75 MG tablet TAKE 1 TABLET BY MOUTH DAILY   cyanocobalamin (,VITAMIN B-12,) 1000 MCG/ML injection INJECT INTO THE MUSCLE EVERY 30 DAYS   FEROSUL 325 (65 Fe) MG tablet TAKE 1 TABLET BY MOUTH DAILY   glucose blood (ONETOUCH VERIO) test strip USE AS DIRECTED DAILY   guaiFENesin (MUCINEX) 600 MG 12 hr tablet Take 1 tablet (600 mg total) by mouth 2 (two) times daily.   loratadine (CLARITIN) 10 MG tablet Take 1 tablet (10 mg total) by mouth daily.   metoprolol succinate (TOPROL-XL) 25 MG 24 hr tablet TAKE 1 TABLET BY MOUTH ONCE  DAILY   mirtazapine (REMERON) 7.5 MG tablet Take 1 tablet (7.5 mg total) by mouth at bedtime.   Multiple Vitamins-Minerals (A THRU Z SELECT 50+ ADVANCED) TABS TAKE 1 TABLET BY MOUTH DAILY (Patient taking differently: Take 1 tablet by mouth daily.)   olmesartan (BENICAR) 20 MG tablet TAKE 1 TABLET BY MOUTH DAILY   pantoprazole (PROTONIX) 40 MG tablet TAKE 1 TABLET(40 MG) BY MOUTH DAILY BEFORE BREAKFAST   Pirfenidone 267 MG TABS TAKE 3 TABLETS (801MG) BY MOUTH  TWICE DAILY WITH FOOD   spironolactone (ALDACTONE) 25 MG tablet TAKE 1 TABLET BY MOUTH DAILY   STIOLTO RESPIMAT 2.5-2.5 MCG/ACT AERS USE 2 INHALATIONS BY MOUTH  DAILY   sucralfate (CARAFATE) 1 g tablet Take 1 tablet (1  g total) by mouth 4 (four) times daily -  with meals and at bedtime.   SYRINGE-NEEDLE, DISP, 3 ML (BD INTEGRA SYRINGE) 25G X 1" 3 ML MISC Use to inject b12 once a month.   [DISCONTINUED] albuterol (VENTOLIN HFA) 108 (90 Base) MCG/ACT inhaler Inhale 2 puffs into the lungs every 6 (six) hours as needed for wheezing or shortness of breath. (Patient not taking: Reported on 10/08/2022)   No facility-administered encounter medications on file as of 10/31/2022.    Allergies as of 10/31/2022   (No Known Allergies)    Physical Exam: Blood pressure 128/60, pulse 76, temperature 97.7 F  (36.5 C), temperature source Oral, height 5' 3"$  (1.6 m), weight 102 lb 9.6 oz (46.5 kg), SpO2 96 %. Gen:      No acute distress HEENT:  EOMI, sclera anicteric Neck:     No masses; no thyromegaly Lungs:    Bibasal crackles CV:         Regular rate and rhythm; no murmurs Abd:      + bowel sounds; soft, non-tender; no palpable masses, no distension Ext:    No edema; adequate peripheral perfusion Skin:      Warm and dry; no rash Neuro: alert and oriented x 3 Psych: normal mood and affect   Data Reviewed: Imaging: CT chest 05/09/2018-mild emphysema, slight progression of interstitial lung disease with UIP pattern CT high-resolution 02/16/2020-mild emphysema, stable UIP pulmonary fibrosis. CT high-resolution 08/15/2020- stable pattern of pulmonary fibrosis, emphysema Chest x-ray 07/10/2022-stable pulmonary fibrosis CT abdomen pelvis 10/19/2022-chronic interstitial lung disease at the base. I have reviewed the images personally.  PFTs: 08/19/19 FVC 2.28 [101%], FEV1 1.78 [101%], F/F 78, TLC 3.86 [78%], DLCO 9.31 [49%]  09/08/2020 FVC 1.65 [74%], FEV1 1.05 [61%], F/F 64, TLC 3.64 [74%], DLCO 6.32 (33%) Moderate restriction with severe diffusion defect  10/31/2022 FVC 1.69 [61%], FEV1 1.22 [58%], F/F72, TLC 4.90 [64%], Moderate restriction.  Unable to complete DLCO  Labs: 10/19/2014 ANA, Aldolase, CCP, SCL 70, SSA/SSB, Jo-1 all negative; however ESR 40, RF 17 (both elevated)  Hepatic panel 10/08/2022-stable   Assessment:  IPF Currently Esbriet.  She is taking the 3 tablets only once per day.  She is reluctant to increase it due to side effects of appetite loss.  We discussed switching to Ofev but would she wants to keep the current therapy going.  Labs in January 2024 are normal  COPD Stable on stiolto Smoking cessation encouraged but she is not interested in quitting.  Time spent counseling-5 minutes Reassess at return visit  Health maintenance Up-to-date with COVID-19 vaccine.  She  plans on getting the booster soon Up-to-date with flu vaccine  Plan/Recommendations: - Continue Esbriet.  Encouraged her to take the full dose of 3 pills 3 times daily - High-res CT in 6 months  Marshell Garfinkel MD [facility] 10/31/2022, 4:04 PM  CC: Ann Held, *

## 2022-10-31 NOTE — Patient Instructions (Signed)
Continue using the Esbriet Will get high-resolution CT in 6 months and return to clinic after CT scan.

## 2022-10-31 NOTE — Progress Notes (Signed)
Full PFT Performed Today. 

## 2022-10-31 NOTE — Patient Instructions (Signed)
Full PFT Performed Today. 

## 2022-11-01 LAB — PULMONARY FUNCTION TEST
DL/VA % pred: 46 %
DL/VA: 1.93 ml/min/mmHg/L
DLCO cor % pred: 29 %
DLCO cor: 5.48 ml/min/mmHg
DLCO unc % pred: 27 %
DLCO unc: 5.09 ml/min/mmHg
FEF 25-75 Post: 0.68 L/sec
FEF 25-75 Pre: 1.03 L/sec
FEF2575-%Change-Post: -33 %
FEF2575-%Pred-Post: 40 %
FEF2575-%Pred-Pre: 60 %
FEV1-%Change-Post: -5 %
FEV1-%Pred-Post: 58 %
FEV1-%Pred-Pre: 61 %
FEV1-Post: 1.22 L
FEV1-Pre: 1.29 L
FEV1FVC-%Change-Post: -1 %
FEV1FVC-%Pred-Pre: 97 %
FEV6-%Change-Post: -8 %
FEV6-%Pred-Post: 59 %
FEV6-%Pred-Pre: 65 %
FEV6-Post: 1.58 L
FEV6-Pre: 1.73 L
FEV6FVC-%Change-Post: 0 %
FEV6FVC-%Pred-Post: 104 %
FEV6FVC-%Pred-Pre: 103 %
FVC-%Change-Post: -3 %
FVC-%Pred-Post: 61 %
FVC-%Pred-Pre: 63 %
FVC-Post: 1.69 L
FVC-Pre: 1.76 L
Post FEV1/FVC ratio: 72 %
Post FEV6/FVC ratio: 99 %
Pre FEV1/FVC ratio: 73 %
Pre FEV6/FVC Ratio: 98 %
RV % pred: 64 %
RV: 1.42 L
TLC % pred: 64 %
TLC: 3.16 L

## 2022-11-14 ENCOUNTER — Other Ambulatory Visit: Payer: Self-pay | Admitting: Pulmonary Disease

## 2022-11-14 DIAGNOSIS — J84112 Idiopathic pulmonary fibrosis: Secondary | ICD-10-CM

## 2022-11-14 NOTE — Telephone Encounter (Signed)
Refill sent for ESBRIET to [facility]: [phone]   Dose: 801 mg once daily per most recent OV note but pt encouraged to take as prescribed  Last OV: 10/31/22 Provider: Dr. Vaughan Browner  Next OV: 6 months (after repeat HRCT)  LFTs on 10/08/22  Knox Saliva, PharmD, MPH, BCPS Clinical Pharmacist (Rheumatology and Pulmonology)

## 2022-12-04 ENCOUNTER — Other Ambulatory Visit: Payer: Self-pay | Admitting: Pulmonary Disease

## 2022-12-04 DIAGNOSIS — J4489 Other specified chronic obstructive pulmonary disease: Secondary | ICD-10-CM

## 2022-12-18 ENCOUNTER — Other Ambulatory Visit: Payer: Self-pay | Admitting: Family Medicine

## 2022-12-18 DIAGNOSIS — I10 Essential (primary) hypertension: Secondary | ICD-10-CM

## 2022-12-19 ENCOUNTER — Other Ambulatory Visit: Payer: Self-pay | Admitting: Family Medicine

## 2022-12-19 DIAGNOSIS — I10 Essential (primary) hypertension: Secondary | ICD-10-CM

## 2023-01-07 ENCOUNTER — Other Ambulatory Visit: Payer: Self-pay | Admitting: Family Medicine

## 2023-01-07 DIAGNOSIS — I10 Essential (primary) hypertension: Secondary | ICD-10-CM

## 2023-02-12 ENCOUNTER — Encounter: Payer: Self-pay | Admitting: Internal Medicine

## 2023-02-12 ENCOUNTER — Telehealth: Payer: Self-pay | Admitting: Internal Medicine

## 2023-02-12 DIAGNOSIS — K921 Melena: Secondary | ICD-10-CM

## 2023-02-12 NOTE — Telephone Encounter (Signed)
I have spoken with patient who tells me that she has had dark stool for "a long time" but over the weekend, it appeared darker than normal, almost black. She denies any associated abdominal pain, diarrhea or other GI symptoms. Patient does take a daily iron supplement but denies any use of pepto or kaopectate that would make her stools dark. Patient says she has abdominal pain "every once in a while" when she eats beef products but nothing recently. Patient is on Plavix as well.  +history of gastric ulcer and autoimmune gastritis.  Dr Leone Payor- we have no availability soon for APP or on your schedule for office visit. Please advise.Marland KitchenMarland Kitchen

## 2023-02-12 NOTE — Telephone Encounter (Signed)
Patient having dark stools no black.  She is on iron but has been on it for a while.  She has not moved her bowels in 2 or 3 days which goes against a GI bleed but she could have had some.  She is otherwise doing okay it sounds like.  I spoke to her.  I am going to have her come in and do a CBC and be met tomorrow.  She knows to come to the lab and we will make further plans pending that.

## 2023-02-13 ENCOUNTER — Other Ambulatory Visit (INDEPENDENT_AMBULATORY_CARE_PROVIDER_SITE_OTHER): Payer: 59

## 2023-02-13 ENCOUNTER — Telehealth: Payer: Self-pay | Admitting: Internal Medicine

## 2023-02-13 DIAGNOSIS — K921 Melena: Secondary | ICD-10-CM

## 2023-02-13 LAB — CBC
HCT: 32.9 % — ABNORMAL LOW (ref 36.0–46.0)
Hemoglobin: 10.9 g/dL — ABNORMAL LOW (ref 12.0–15.0)
MCHC: 33 g/dL (ref 30.0–36.0)
MCV: 89.1 fl (ref 78.0–100.0)
Platelets: 250 10*3/uL (ref 150.0–400.0)
RBC: 3.69 Mil/uL — ABNORMAL LOW (ref 3.87–5.11)
RDW: 14.6 % (ref 11.5–15.5)
WBC: 4.1 10*3/uL (ref 4.0–10.5)

## 2023-02-13 LAB — BASIC METABOLIC PANEL
BUN: 15 mg/dL (ref 6–23)
CO2: 24 mEq/L (ref 19–32)
Calcium: 9.4 mg/dL (ref 8.4–10.5)
Chloride: 106 mEq/L (ref 96–112)
Creatinine, Ser: 1.22 mg/dL — ABNORMAL HIGH (ref 0.40–1.20)
GFR: 43.9 mL/min — ABNORMAL LOW (ref >60.00)
Glucose, Bld: 92 mg/dL (ref 70–99)
Potassium: 4.9 mEq/L (ref 3.5–5.1)
Sodium: 142 mEq/L (ref 135–145)

## 2023-02-13 NOTE — Telephone Encounter (Signed)
    Latest Ref Rng & Units 02/13/2023   11:49 AM 10/08/2022    3:45 PM 01/23/2022   11:12 AM  CBC  WBC 4.0 - 10.5 K/uL 4.1  5.7  3.9   Hemoglobin 12.0 - 15.0 g/dL 65.7  84.6  96.2   Hematocrit 36.0 - 46.0 % 32.9  33.2  34.9   Platelets 150.0 - 400.0 K/uL 250.0  217.0  213.0     Recent Labs  Lab 02/13/23 1149  NA 142  K 4.9  CL 106  CO2 24  GLUCOSE 92  BUN 15  CREATININE 1.22*  CALCIUM 9.4    She was having some black stools.  Her hemoglobin is very similar to 4 months ago she has not moved her bowels in a few days I do not think this is a GI bleed.  Her BUN was not elevated her creatinine was near baseline and her other electrolytes were okay as well.  She is aware to observe and let me know if things change

## 2023-04-14 ENCOUNTER — Other Ambulatory Visit: Payer: Self-pay | Admitting: Internal Medicine

## 2023-04-15 ENCOUNTER — Telehealth: Payer: Self-pay | Admitting: Licensed Clinical Social Worker

## 2023-04-15 NOTE — Patient Outreach (Signed)
  Care Coordination   04/15/2023 Name: Shabreka Lutfi MRN: [id number] DOB: Apr 04, 1951   Care Coordination Outreach Attempts:  An unsuccessful telephone outreach was attempted today to offer the patient information about available care coordination services.  Follow Up Plan:  Additional outreach attempts will be made to offer the patient care coordination information and services.   Encounter Outcome:  No Answer   Care Coordination Interventions:  No, not indicated    Sammuel Hines, LCSW Social Work Care Coordination  [facility] [employer] [phone]

## 2023-04-18 ENCOUNTER — Other Ambulatory Visit: Payer: Self-pay | Admitting: Family Medicine

## 2023-04-22 ENCOUNTER — Other Ambulatory Visit: Payer: Self-pay

## 2023-04-22 ENCOUNTER — Other Ambulatory Visit (HOSPITAL_BASED_OUTPATIENT_CLINIC_OR_DEPARTMENT_OTHER): Payer: Self-pay

## 2023-04-22 ENCOUNTER — Ambulatory Visit (HOSPITAL_BASED_OUTPATIENT_CLINIC_OR_DEPARTMENT_OTHER)
Admission: RE | Admit: 2023-04-22 | Discharge: 2023-04-22 | Disposition: A | Discharge status: Home / Self Care | Payer: 59 | Source: Ambulatory Visit | Attending: Pulmonary Disease | Admitting: Pulmonary Disease

## 2023-04-22 DIAGNOSIS — J432 Centrilobular emphysema: Secondary | ICD-10-CM | POA: Diagnosis not present

## 2023-04-22 DIAGNOSIS — J84112 Idiopathic pulmonary fibrosis: Secondary | ICD-10-CM | POA: Insufficient documentation

## 2023-04-22 DIAGNOSIS — R0602 Shortness of breath: Secondary | ICD-10-CM | POA: Diagnosis not present

## 2023-04-22 DIAGNOSIS — J8410 Pulmonary fibrosis, unspecified: Secondary | ICD-10-CM | POA: Diagnosis not present

## 2023-04-22 NOTE — Progress Notes (Signed)
Taking otc

## 2023-04-27 ENCOUNTER — Other Ambulatory Visit: Payer: Self-pay | Admitting: Cardiovascular Disease

## 2023-04-27 DIAGNOSIS — E785 Hyperlipidemia, unspecified: Secondary | ICD-10-CM

## 2023-04-29 ENCOUNTER — Encounter: Payer: Self-pay | Admitting: Pulmonary Disease

## 2023-04-29 ENCOUNTER — Telehealth: Payer: Self-pay | Admitting: Pharmacist

## 2023-04-29 ENCOUNTER — Ambulatory Visit: Payer: 59 | Admitting: Pulmonary Disease

## 2023-04-29 ENCOUNTER — Telehealth: Payer: Self-pay | Admitting: Licensed Clinical Social Worker

## 2023-04-29 VITALS — BP 128/58 | HR 67 | Temp 97.8°F | Ht 63.0 in | Wt 100.8 lb

## 2023-04-29 DIAGNOSIS — Z5181 Encounter for therapeutic drug level monitoring: Secondary | ICD-10-CM

## 2023-04-29 DIAGNOSIS — J84112 Idiopathic pulmonary fibrosis: Secondary | ICD-10-CM

## 2023-04-29 DIAGNOSIS — R0600 Dyspnea, unspecified: Secondary | ICD-10-CM

## 2023-04-29 LAB — COMPREHENSIVE METABOLIC PANEL
ALT: 13 U/L (ref 0–35)
AST: 19 U/L (ref 0–37)
Albumin: 3.9 g/dL (ref 3.5–5.2)
Alkaline Phosphatase: 64 U/L (ref 39–117)
BUN: 14 mg/dL (ref 6–23)
CO2: 26 mEq/L (ref 19–32)
Calcium: 9.6 mg/dL (ref 8.4–10.5)
Chloride: 107 mEq/L (ref 96–112)
Creatinine, Ser: 1.24 mg/dL — ABNORMAL HIGH (ref 0.40–1.20)
GFR: 42.99 mL/min — ABNORMAL LOW (ref >60.00)
Glucose, Bld: 67 mg/dL — ABNORMAL LOW (ref 70–99)
Potassium: 4.8 mEq/L (ref 3.5–5.1)
Sodium: 140 mEq/L (ref 135–145)
Total Bilirubin: 0.3 mg/dL (ref 0.2–1.2)
Total Protein: 6.8 g/dL (ref 6.0–8.3)

## 2023-04-29 LAB — BRAIN NATRIURETIC PEPTIDE: Pro B Natriuretic peptide (BNP): 135 pg/mL — ABNORMAL HIGH (ref 0.0–100.0)

## 2023-04-29 MED ORDER — PIRFENIDONE 801 MG PO TABS: 801.0000 mg | ORAL_TABLET | Freq: Three times a day (TID) | ORAL | 1 refills | Status: DC

## 2023-04-29 NOTE — Progress Notes (Signed)
Christine Meyer    [id number]    [birth year]  Primary Care Physician:Christine Almeta Monas Grayling Congress, DO  Referring Physician: Zola Meyer, Grayling Congress, DO [address] [place],  [place] [address]  Chief complaint: Follow-up for COPD, idiopathic pulmonary fibrosis  HPI: Christine Meyer was diagnosed with IPF in 2016 after CT scan showing UIP pulmonary fibrosis and centrilobular emphysema.  She has significant smoking history.  Evaluated by rheumatology in 2016 by Dr. Nickola Major for mildly elevated rheumatoid factor with no evidence of connective tissue disease She is started on Esbriet in 2016 She was initially taking it twice a day due to fear of side effects.  Dose increased to full dose, 3 times a day in 2021 and she has tolerated it well.  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  No mold, hot tub, Jacuzzi, no feather pillows or comforter Smoking history: 13-pack-year smoker.  Continues to smoke every day Travel history: No significant travel history Relevant family history: No significant family history of lung disease  Interim history: On Esbriet but she is only taking 3 pills twice a day.  She does not like to take more than that as it causes loss of appetite. Continues on stiolto.  Continues to smoke.  She is here for review of CT scan.  Outpatient Encounter Medications as of 04/29/2023  Medication Sig   ACCU-CHEK SOFTCLIX LANCETS lancets Check blood sugar once daily. Dx:E11.9   albuterol (PROVENTIL HFA) 108 (90 Base) MCG/ACT inhaler Inhale 1-2 puffs into the lungs every 6 (six) hours as needed for wheezing or shortness of breath.   aspirin EC 81 MG tablet Take 1 tablet (81 mg total) by mouth daily.   atorvastatin (LIPITOR) 80 MG tablet Take 1 tablet (80 mg total) by mouth daily.   Blood Glucose Monitoring Suppl (ONETOUCH VERIO) w/Device KIT Use daily to check blood sugar.   DX E11.9   Calcium 500-100 MG-UNIT CHEW Chew 1 tablet  by mouth daily before breakfast.   Cholecalciferol (VITAMIN D3) 50 MCG (2000 UT) capsule Take 1 capsule (2,000 Units total) by mouth daily.   clopidogrel (PLAVIX) 75 MG tablet TAKE 1 TABLET BY MOUTH DAILY   cyanocobalamin (,VITAMIN B-12,) 1000 MCG/ML injection INJECT INTO THE MUSCLE EVERY 30 DAYS   FEROSUL 325 (65 Fe) MG tablet TAKE 1 TABLET BY MOUTH DAILY   glucose blood (ONETOUCH VERIO) test strip USE AS DIRECTED DAILY   guaiFENesin (MUCINEX) 600 MG 12 hr tablet Take 1 tablet (600 mg total) by mouth 2 (two) times daily.   loratadine (CLARITIN) 10 MG tablet Take 1 tablet (10 mg total) by mouth daily.   metoprolol succinate (TOPROL-XL) 25 MG 24 hr tablet TAKE 1 TABLET BY MOUTH DAILY  TAKE WITH OR IMMEDIATELY  FOLLOWING A MEAL   mirtazapine (REMERON) 7.5 MG tablet Take 1 tablet (7.5 mg total) by mouth at bedtime.   Multiple Vitamins-Minerals (A THRU Z SELECT 50+ ADVANCED) TABS TAKE 1 TABLET BY MOUTH DAILY (Patient taking differently: Take 1 tablet by mouth daily.)   olmesartan (BENICAR) 20 MG tablet TAKE 1 TABLET BY MOUTH DAILY   pantoprazole (PROTONIX) 40 MG tablet TAKE 1 TABLET(40 MG) BY MOUTH DAILY BEFORE BREAKFAST   Pirfenidone 267 MG TABS TAKE 3 TABLETS BY MOUTH TWICE  DAILY WITH FOOD   spironolactone (ALDACTONE) 25 MG tablet TAKE 1 TABLET BY MOUTH DAILY   STIOLTO RESPIMAT 2.5-2.5 MCG/ACT AERS USE 2 INHALATIONS BY  MOUTH DAILY   sucralfate (CARAFATE) 1 g tablet Take 1 tablet (1 g total) by mouth 4 (four) times daily -  with meals and at bedtime.   SYRINGE-NEEDLE, DISP, 3 ML (BD INTEGRA SYRINGE) 25G X 1" 3 ML MISC Use to inject b12 once a month.   No facility-administered encounter medications on file as of 04/29/2023.    Allergies as of 04/29/2023   (No Known Allergies)    Physical Exam: Blood pressure (!) 128/58, pulse 67, temperature 97.8 F (36.6 C), temperature source Oral, height 5\' 3"  (1.6 m), weight 100 lb 12.8 oz (45.7 kg), SpO2 100%. Gen:      No acute distress HEENT:  EOMI,  sclera anicteric Neck:     No masses; no thyromegaly Lungs:  Bibasal crackles CV:         Regular rate and rhythm; no murmurs Abd:      + bowel sounds; soft, non-tender; no palpable masses, no distension Ext:    No edema; adequate peripheral perfusion Skin:      Warm and dry; no rash Neuro: alert and oriented x 3 Psych: normal mood and affect   Data Reviewed: Imaging: CT chest 05/09/2018-mild emphysema, slight progression of interstitial lung disease with UIP pattern CT high-resolution 02/16/2020-mild emphysema, stable UIP pulmonary fibrosis. CT high-resolution 08/15/2020- stable pattern of pulmonary fibrosis, emphysema Chest x-ray 07/10/2022-stable pulmonary fibrosis CT abdomen pelvis 10/19/2022-chronic interstitial lung disease at the base. High resolution CT 04/22/2023-minimally progressive pattern of pulmonary fibrosis in UIP I have reviewed the images personally.  PFTs: 08/19/19 FVC 2.28 [101%], FEV1 1.78 [101%], F/F 78, TLC 3.86 [78%], DLCO 9.31 [49%]  09/08/2020 FVC 1.65 [74%], FEV1 1.05 [61%], F/F 64, TLC 3.64 [74%], DLCO 6.32 (33%) Moderate restriction with severe diffusion defect  10/31/2022 FVC 1.69 [61%], FEV1 1.22 [58%], F/F72, TLC 4.90 [64%], Moderate restriction.  Unable to complete DLCO  Labs: 10/19/2014 ANA, Aldolase, CCP, SCL 70, SSA/SSB, Jo-1 all negative; however ESR 40, RF 17 (both elevated)  Hepatic panel 10/08/2022-stable  Assessment:  IPF Currently Esbriet.  She is taking the 3 tablets only twice per day.  She is reluctant to increase it due to side effects of appetite loss.  We discussed switching to Ofev but would she wants to keep the current therapy going. I will consolidate to 3 Esbriet pills into 1 and have encouraged her to take it 3 times a day.  Check comprehensive metabolic panel and proBNP for monitoring  COPD Stable on stiolto Smoking cessation encouraged but she is not interested in quitting.  Time spent counseling-5 minutes Reassess at return  visit  Health maintenance Up-to-date with COVID-19 vaccine.  She plans on getting the booster soon Up-to-date with flu vaccine  Plan/Recommendations: - Continue Esbriet.  Encouraged her to take the full dose 3 times daily - Check labs for monitoring  Chilton Greathouse MD [facility] 04/29/2023, 8:53 AM  CC: Christine Meyer, Grayling Meyer, *

## 2023-04-29 NOTE — Patient Instructions (Addendum)
I am glad you are doing well with your breathing We will check labs today for monitoring.  Will check complete metabolic panel and proBNP for dyspnea Will check with pharmacy and consolidate your Esbriet pills into 1 Follow-up in 6 months

## 2023-04-29 NOTE — Telephone Encounter (Addendum)
Rx for pirfenidone 801mg  three times daily sent to [facility]. Patient advised to call pharmacy if due for fill. Advised her that this is not dose increase but tablet qty decrease only.  LFTs from this morning wnl. Patient states she had not eaten before appt this morning (BG was 67)  ----- Message from [facility] sent at 04/29/2023  9:40 AM EDT ----- Can we please consolidate her 3 Esbriet pills into 1 pill of 801 mg.  Thanks

## 2023-04-29 NOTE — Patient Outreach (Signed)
  Care Coordination   04/29/2023 Name: Rendi Clemen MRN: [id number] DOB: 1951-05-19   Care Coordination Outreach Attempts:  A second unsuccessful outreach was attempted today to offer the patient with information about available care coordination services.  Follow Up Plan:  Additional outreach attempts will be made to offer the patient care coordination information and services.   Encounter Outcome:  No Answer   Care Coordination Interventions:  No, not indicated    Sammuel Hines, LCSW Social Work Care Coordination  [facility] [employer] [phone]

## 2023-04-30 ENCOUNTER — Ambulatory Visit: Payer: 59 | Admitting: Licensed Clinical Social Worker

## 2023-04-30 NOTE — Patient Outreach (Signed)
  Care Coordination  Initial Visit Note   04/30/2023 Name: Christine Meyer MRN: [id number] DOB: 05/13/1951  Christine Meyer is a 72 y.o. year old female who sees Zola Button, Grayling Congress, DO for primary care. I spoke with  Christine Meyer by phone today.  What matters to the patients health and wellness today?    Patient reports no concerns or needs from Care Coordination team with health and wellness related to physical or mental heath. .  However would like information on advance directives. ( Information mailed)   Goals Addressed             This Visit's Progress    COMPLETED: Completed Advance Directive       Activities and task to complete in order to accomplish goals.   Keep all upcoming appointment discussed today Continue with compliance of taking medication prescribed by Doctor Complete Advance Directive packet,  Have advance directive notarized and provide a copy to provider office         SDOH assessments and interventions completed:  Yes  SDOH Interventions Today    Flowsheet Row Most Recent Value  SDOH Interventions   Food Insecurity Interventions Intervention Not Indicated  Housing Interventions Intervention Not Indicated  Transportation Interventions Intervention Not Indicated  Utilities Interventions Intervention Not Indicated  Financial Strain Interventions Intervention Not Indicated  Stress Interventions Intervention Not Indicated  Health Literacy Interventions Intervention Not Indicated       Care Coordination Interventions:  Yes, provided  Interventions Today    Flowsheet Row Most Recent Value  Chronic Disease   Chronic disease during today's visit Diabetes, Hypertension (HTN)  General Interventions   General Interventions Discussed/Reviewed General Interventions Discussed, Level of Care  [Value Based Care Services]  Level of Care --  [able to meet all ADL's]  Education Interventions   Education Provided Provided Education  Provided Verbal  Education On [employer], Development worker, community  Mental Health Interventions   Mental Health Discussed/Reviewed Mental Health Reviewed  Nutrition Interventions   Nutrition Discussed/Reviewed Nutrition Reviewed  Pharmacy Interventions   Pharmacy Dicussed/Reviewed Pharmacy Topics Reviewed  Advanced Directive Interventions   Advanced Directives Discussed/Reviewed Advanced Directives Discussed, Provided resource for acquiring and filling out documents       Follow up plan: No further intervention required.   Encounter Outcome:  Pt. Visit Completed   Sammuel Hines, LCSW Social Work Care Coordination  [facility] [employer] [phone]

## 2023-04-30 NOTE — Patient Instructions (Signed)
Social Work Visit Information  Thank you for taking time to visit with me today. Please don't hesitate to contact me if I can be of assistance to you.   Following are the goals we discussed today:   Goals Addressed             This Visit's Progress    COMPLETED: Completed Advance Directive       Activities and task to complete in order to accomplish goals.   Keep all upcoming appointment discussed today Continue with compliance of taking medication prescribed by Doctor Complete Advance Directive packet,  Have advance directive notarized and provide a copy to provider office          No follow up scheduled with social work at this time.  Please call the care guide team at [phone] if you need to cancel or reschedule your appointment.   If you or anyone you know are experiencing a Mental Health or Behavioral Health Crisis or need someone to talk to, please call the Suicide and Crisis Lifeline: 988 call the [place] National Suicide Prevention Lifeline: 661-806-8116 or TTY: 845-401-9193 TTY 8195554455) to talk to a trained counselor call 1-800-273-TALK (toll free, 24 hour hotline) go to [facility] [address] [place] [phone])   Patient verbalizes understanding of instructions and care plan provided today and agrees to view in MyChart. Active MyChart status and patient understanding of how to access instructions and care plan via MyChart confirmed with patient.       Sammuel Hines, LCSW Social Work Care Coordination  [facility] [employer] [phone]

## 2023-06-24 ENCOUNTER — Encounter: Payer: Self-pay | Admitting: Family Medicine

## 2023-06-24 ENCOUNTER — Ambulatory Visit (INDEPENDENT_AMBULATORY_CARE_PROVIDER_SITE_OTHER): Payer: 59 | Admitting: Family Medicine

## 2023-06-24 VITALS — BP 118/60 | HR 79 | Temp 97.8°F | Resp 16 | Ht 63.0 in | Wt 92.2 lb

## 2023-06-24 DIAGNOSIS — I10 Essential (primary) hypertension: Secondary | ICD-10-CM

## 2023-06-24 DIAGNOSIS — F17200 Nicotine dependence, unspecified, uncomplicated: Secondary | ICD-10-CM | POA: Diagnosis not present

## 2023-06-24 DIAGNOSIS — E785 Hyperlipidemia, unspecified: Secondary | ICD-10-CM

## 2023-06-24 DIAGNOSIS — Z23 Encounter for immunization: Secondary | ICD-10-CM | POA: Diagnosis not present

## 2023-06-24 DIAGNOSIS — J441 Chronic obstructive pulmonary disease with (acute) exacerbation: Secondary | ICD-10-CM

## 2023-06-24 DIAGNOSIS — E1165 Type 2 diabetes mellitus with hyperglycemia: Secondary | ICD-10-CM

## 2023-06-24 DIAGNOSIS — R6881 Early satiety: Secondary | ICD-10-CM | POA: Diagnosis not present

## 2023-06-24 DIAGNOSIS — R634 Abnormal weight loss: Secondary | ICD-10-CM | POA: Diagnosis not present

## 2023-06-24 MED ORDER — MIRTAZAPINE 7.5 MG PO TABS
7.5000 mg | ORAL_TABLET | Freq: Every day | ORAL | 1 refills | Status: DC
Start: 2023-06-24 — End: 2023-08-15
  Filled 2023-07-22: qty 30, 30d supply, fill #0

## 2023-06-24 MED ORDER — SPIRONOLACTONE 25 MG PO TABS
ORAL_TABLET | ORAL | 2 refills | Status: DC
Start: 2023-06-24 — End: 2023-11-06

## 2023-06-24 NOTE — Patient Instructions (Signed)
Insomnia Insomnia is a sleep disorder that makes it difficult to fall asleep or stay asleep. Insomnia can cause fatigue, low energy, difficulty concentrating, mood swings, and poor performance at work or school. There are three different ways to classify insomnia: Difficulty falling asleep. Difficulty staying asleep. Waking up too early in the morning. Any type of insomnia can be long-term (chronic) or short-term (acute). Both are common. Short-term insomnia usually lasts for 3 months or less. Chronic insomnia occurs at least three times a week for longer than 3 months. What are the causes? Insomnia may be caused by another condition, situation, or substance, such as: Having certain mental health conditions, such as anxiety and depression. Using caffeine, alcohol, tobacco, or drugs. Having gastrointestinal conditions, such as gastroesophageal reflux disease (GERD). Having certain medical conditions. These include: Asthma. Alzheimer's disease. Stroke. Chronic pain. An overactive thyroid gland (hyperthyroidism). Other sleep disorders, such as restless legs syndrome and sleep apnea. Menopause. Sometimes, the cause of insomnia may not be known. What increases the risk? Risk factors for insomnia include: Gender. Females are affected more often than males. Age. Insomnia is more common as people get older. Stress and certain medical and mental health conditions. Lack of exercise. Having an irregular work schedule. This may include working night shifts and traveling between different time zones. What are the signs or symptoms? If you have insomnia, the main symptom is having trouble falling asleep or having trouble staying asleep. This may lead to other symptoms, such as: Feeling tired or having low energy. Feeling nervous about going to sleep. Not feeling rested in the morning. Having trouble concentrating. Feeling irritable, anxious, or depressed. How is this diagnosed? This condition  may be diagnosed based on: Your symptoms and medical history. Your health care provider may ask about: Your sleep habits. Any medical conditions you have. Your mental health. A physical exam. How is this treated? Treatment for insomnia depends on the cause. Treatment may focus on treating an underlying condition that is causing the insomnia. Treatment may also include: Medicines to help you sleep. Counseling or therapy. Lifestyle adjustments to help you sleep better. Follow these instructions at home: Eating and drinking  Limit or avoid alcohol, caffeinated beverages, and products that contain nicotine and tobacco, especially close to bedtime. These can disrupt your sleep. Do not eat a large meal or eat spicy foods right before bedtime. This can lead to digestive discomfort that can make it hard for you to sleep. Sleep habits  Keep a sleep diary to help you and your health care provider figure out what could be causing your insomnia. Write down: When you sleep. When you wake up during the night. How well you sleep and how rested you feel the next day. Any side effects of medicines you are taking. What you eat and drink. Make your bedroom a dark, comfortable place where it is easy to fall asleep. Put up shades or blackout curtains to block light from outside. Use a white noise machine to block noise. Keep the temperature cool. Limit screen use before bedtime. This includes: Not watching TV. Not using your smartphone, tablet, or computer. Stick to a routine that includes going to bed and waking up at the same times every day and night. This can help you fall asleep faster. Consider making a quiet activity, such as reading, part of your nighttime routine. Try to avoid taking naps during the day so that you sleep better at night. Get out of bed if you are still awake after  15 minutes of trying to sleep. Keep the lights down, but try reading or doing a quiet activity. When you feel  sleepy, go back to bed. General instructions Take over-the-counter and prescription medicines only as told by your health care provider. Exercise regularly as told by your health care provider. However, avoid exercising in the hours right before bedtime. Use relaxation techniques to manage stress. Ask your health care provider to suggest some techniques that may work well for you. These may include: Breathing exercises. Routines to release muscle tension. Visualizing peaceful scenes. Make sure that you drive carefully. Do not drive if you feel very sleepy. Keep all follow-up visits. This is important. Contact a health care provider if: You are tired throughout the day. You have trouble in your daily routine due to sleepiness. You continue to have sleep problems, or your sleep problems get worse. Get help right away if: You have thoughts about hurting yourself or someone else. Get help right away if you feel like you may hurt yourself or others, or have thoughts about taking your own life. Go to your nearest emergency room or: Call 911. Call the National Suicide Prevention Lifeline at 9548532250 or 988. This is open 24 hours a day. Text the Crisis Text Line at (620) 722-2239. Summary Insomnia is a sleep disorder that makes it difficult to fall asleep or stay asleep. Insomnia can be long-term (chronic) or short-term (acute). Treatment for insomnia depends on the cause. Treatment may focus on treating an underlying condition that is causing the insomnia. Keep a sleep diary to help you and your health care provider figure out what could be causing your insomnia. This information is not intended to replace advice given to you by your health care provider. Make sure you discuss any questions you have with your health care provider. Document Revised: 08/07/2021 Document Reviewed: 08/07/2021 Elsevier Patient Education  2024 [employer].

## 2023-06-24 NOTE — Progress Notes (Signed)
Established Patient Office Visit  Subjective   Patient ID: Christine Meyer, female    DOB: 08-20-1951  Age: 72 y.o. MRN: [id number]  Chief Complaint  Patient presents with   Insomnia    HPI Discussed the use of AI scribe software for clinical note transcription with the patient, who gave verbal consent to proceed.  History of Present Illness   The patient, with a history of smoking, presents with significant weight loss, decreased appetite, and a persistent cough producing white phlegm. She reports a loss of 8 pounds, from 105 to 97 pounds. She has been trying to maintain her nutrition with Ensure and small amounts of food, but reports a lack of taste for anything. She has been experiencing shortness of breath, which she manages by resting when needed. She is on inhalers, one of which she takes daily and the other as needed. She also mentions taking a fluid pill (spironolactone) as needed for swelling. The patient is also dealing with significant emotional stress due to the recent loss of her child.      Patient Active Problem List   Diagnosis Date Noted   Early satiety 10/09/2022   IPF (idiopathic pulmonary fibrosis) (HCC) 07/11/2022   Preventative health care 01/23/2022   Hyperlipidemia associated with type 2 diabetes mellitus (HCC) 01/23/2022   Claudication in peripheral vascular disease (HCC) 12/08/2020   Mesenteric ischemia (HCC) 08/16/2020   Bronchitis 07/10/2020   COPD with acute exacerbation (HCC) 06/30/2020   Rheumatoid arthritis involving multiple sites (HCC) 01/21/2020   Coagulation disorder (HCC) 01/21/2020   Uncontrolled type 2 diabetes mellitus with hyperglycemia (HCC) 01/21/2020   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   Hyperlipidemia LDL goal <70 06/28/2018   Degenerative disc disease, cervical 03/27/2018   Gastric polyps - hyperplastic 02/23/2018   Antiplatelet or antithrombotic long-term use - clopidogrel - CAD/PAD 02/13/2018   Osteopenia  01/26/2018   Dyspepsia 01/26/2018   Rotator cuff tear, left 01/24/2018   COPD (chronic obstructive pulmonary disease) (HCC) 07/26/2017   Weight loss 04/22/2017   Cough 04/22/2017   Current every day smoker 04/22/2017   Bilateral carotid artery disease (HCC) 07/04/2016   Claudication (HCC) 11/22/2014   Peripheral arterial disease (HCC) 10/22/2014   Renal artery stenosis, native, bilateral (HCC) 10/22/2014   Centrilobular emphysema (HCC) 10/19/2014   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   Pain in joint, lower leg 08/24/2014   Dyslipidemia 03/12/2011   Coronary atherosclerosis 09/19/2010   GERD 02/23/2010   Smoker 10/14/2009   EDEMA- LOCALIZED 04/06/2008   CAROTID BRUITS, BILATERAL 04/06/2008   Disorder resulting from impaired renal function 10/09/2007   DM (diabetes mellitus) type II, controlled, with peripheral vascular disorder (HCC) 07/07/2007   Normocytic anemia 07/07/2007   Essential hypertension 07/07/2007   HX, PERSONAL, MALIGNANCY, UTERUS NEC 07/07/2007   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   CAD (coronary artery disease)    Stent RCA 1999   Chronic kidney disease    COPD (chronic obstructive pulmonary disease) (HCC)    Diabetes mellitus, type 2 (HCC)    Diet controlled   Gastric polyps - hyperplastic 02/23/2018   Hyperlipidemia    Hypertension    OSA (obstructive sleep apnea) [name]    PVD (peripheral vascular disease) (HCC)    Carotid stenosis, renal artery stenosis   Sleep apnea    dx'd but doesn't use mask  Uterine cancer (HCC) ?1610'R   Past Surgical History:  Procedure Laterality Date   ABDOMINAL ANGIOGRAM  11/22/2014   Procedure: ABDOMINAL ANGIOGRAM;  Surgeon: Runell Gess, MD;  Location: [facility] CATH LAB;  Service: Cardiovascular;;   ABDOMINAL AORTOGRAM W/LOWER EXTREMITY N/A 10/03/2020   Procedure: ABDOMINAL AORTOGRAM W/LOWER EXTREMITY;  Surgeon:  Runell Gess, MD;  Location: [facility] INVASIVE CV LAB;  Service: Cardiovascular;  Laterality: N/A;   COLONOSCOPY  2000   negative   CORONARY ANGIOPLASTY WITH STENT PLACEMENT  1999   RCA; Dr. Elsie Lincoln G4 P4   ESOPHAGOGASTRODUODENOSCOPY     ILIAC ARTERY STENT Right 11/22/2014   LOWER EXTREMITY ANGIOGRAM N/A 11/22/2014   Procedure: LOWER EXTREMITY ANGIOGRAM;  Surgeon: Runell Gess, MD; L-SFA 100%, 3v runoff, R-pCIA 95>>0% w/ 8 mm x 18 mm long  Balloon expandable stent; R-dCIA 67>>0% w/ 8 mm x 4 cm long Cordis Smart Nitinol self  expanding stent    LOWER EXTREMITY ANGIOGRAPHY Bilateral 12/08/2020   Procedure: Lower Extremity Angiography;  Surgeon: Runell Gess, MD;  Location: [facility] INVASIVE CV LAB;  Service: Cardiovascular;  Laterality: Bilateral;   PERIPHERAL VASCULAR ATHERECTOMY Left 12/08/2020   Procedure: PERIPHERAL VASCULAR ATHERECTOMY;  Surgeon: Runell Gess, MD;  Location: [facility] INVASIVE CV LAB;  Service: Cardiovascular;  Laterality: Left;  common iliac   PERIPHERAL VASCULAR INTERVENTION Left 12/08/2020   Procedure: PERIPHERAL VASCULAR INTERVENTION;  Surgeon: Runell Gess, MD;  Location: [facility] INVASIVE CV LAB;  Service: Cardiovascular;  Laterality: Left;  common iliac   RENAL ARTERY STENT Right May 2007; 08/2006   ; restenosis   TOTAL ABDOMINAL HYSTERECTOMY  ?6045'W    for uterine CA   VISCERAL ARTERY INTERVENTION N/A 10/03/2020   Procedure: VISCERAL ARTERY INTERVENTION;  Surgeon: Runell Gess, MD;  Location: [facility] INVASIVE CV LAB;  Service: Cardiovascular;  Laterality: N/A;  SMA   Social History   Tobacco Use   Smoking status: Every Day    Current packs/day: 0.25    Average packs/day: 0.3 packs/day for 51.0 years (12.8 ttl pk-yrs)    Types: Cigarettes    Passive exposure: Current   Smokeless tobacco: Never   Tobacco comments:    4 cigarettes per day/lt 10/31/22  Vaping Use   Vaping status: Never Used  Substance Use Topics   Alcohol use: No    Alcohol/week: 0.0 standard  drinks of alcohol   Drug use: No   Social History   Socioeconomic History   Marital status: Single    Spouse name: Not on file   Number of children: 4   Years of education: Not on file   Highest education level: Not on file  Occupational History   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    Employer: [employer]  Tobacco Use   Smoking status: Every Day    Current packs/day: 0.25    Average packs/day: 0.3 packs/day for 51.0 years (12.8 ttl pk-yrs)    Types: Cigarettes    Passive exposure: Current   Smokeless tobacco: Never   Tobacco comments:    4 cigarettes per day/lt 10/31/22  Vaping Use   Vaping status: Never Used  Substance and Sexual Activity   Alcohol use: No    Alcohol/week: 0.0 standard drinks of alcohol   Drug use: No   Sexual activity: Not Currently  Other Topics Concern   Not on file  Social History Narrative      Daughter [name] is CMA Adult nurse primary care [facility]  Retired Information systems manager   Smoker, no EtOH/drugs   Social Determinants of Corporate investment banker Strain: Low Risk  (04/30/2023)   Overall Financial Resource Strain (CARDIA)    Difficulty of Paying Living Expenses: Not very hard  Food Insecurity: No Food Insecurity (04/30/2023)   Hunger Vital Sign    Worried About Running Out of Food in the Last Year: Never true    Ran Out of Food in the Last Year: Never true  Transportation Needs: No Transportation Needs (04/30/2023)   PRAPARE - Administrator, Civil Service (Medical): No    Lack of Transportation (Non-Medical): No  Physical Activity: Inactive (07/20/2021)   Exercise Vital Sign    Days of Exercise per Week: 0 days    Minutes of Exercise per Session: 0 min  Stress: No Stress Concern Present (04/30/2023)   [employer] of Occupational Health - Occupational Stress Questionnaire    Feeling of Stress : Not at all  Social Connections: Moderately Isolated (07/20/2021)   Social  Connection and Isolation Panel [NHANES]    Frequency of Communication with Friends and Family: More than three times a week    Frequency of Social Gatherings with Friends and Family: More than three times a week    Attends Religious Services: More than 4 times per year    Active Member of [employer] or Organizations: No    Attends Banker Meetings: Never    Marital Status: Never married  Intimate Partner Violence: Not At Risk (04/30/2023)   Humiliation, Afraid, Rape, and Kick questionnaire    Fear of Current or Ex-Partner: No    Emotionally Abused: No    Physically Abused: No    Sexually Abused: No   Family Status  Relation Name Status   Mother  Deceased   Father  Deceased   Sister  (Not Specified)   Brother  Deceased   Brother  (Not Specified)   MGM  (Not Specified)   Daughter benita Jaworski Deceased at age 67       MI/ stroke   Daughter LUCY Alive   Mat Aunt  (Not Specified)   Oceanographer  (Not Specified)  No partnership data on file   Family History  Problem Relation Age of Onset   Heart failure Mother        after hip fx.    Hypertension Mother    Diabetes Mother    Deep vein thrombosis Mother    Hypertension Father    Diabetes Sister        insulin dependent   Hypertension Brother    Emphysema Brother    Diabetes Brother        insulin dependent   Diabetes Maternal Grandmother        non-insulin dependent   Stroke Daughter    Heart attack Daughter    Breast cancer Daughter    Brain cancer Daughter    Breast cancer Daughter    Diabetes Maternal Aunt        non-insulent dependent   Heart attack Paternal Aunt    No Known Allergies    Review of Systems  Constitutional:  Positive for weight loss. Negative for fever and malaise/fatigue.  HENT:  Negative for congestion.   Eyes:  Negative for blurred vision.  Respiratory:  Positive for cough and sputum production. Negative for shortness of breath.   Cardiovascular:  Negative for chest pain, palpitations  and leg swelling.  Gastrointestinal:  Negative for abdominal pain, blood  in stool, nausea and vomiting.  Genitourinary:  Negative for dysuria and frequency.  Musculoskeletal:  Negative for back pain and falls.  Skin:  Negative for rash.  Neurological:  Negative for dizziness, loss of consciousness and headaches.  Endo/Heme/Allergies:  Negative for environmental allergies.  Psychiatric/Behavioral:  Negative for depression. The patient has insomnia. The patient is not nervous/anxious.       Objective:     BP 118/60 (BP Location: Left Arm, Patient Position: Sitting, Cuff Size: Normal)   Pulse 79   Temp 97.8 F (36.6 C) (Oral)   Resp 16   Ht 5\' 3"  (1.6 m)   Wt 92 lb 3.2 oz (41.8 kg)   SpO2 98%   BMI 16.33 kg/m  BP Readings from Last 3 Encounters:  06/24/23 118/60  04/29/23 (!) 128/58  10/31/22 128/60   Wt Readings from Last 3 Encounters:  06/24/23 92 lb 3.2 oz (41.8 kg)  04/29/23 100 lb 12.8 oz (45.7 kg)  10/31/22 102 lb 9.6 oz (46.5 kg)   SpO2 Readings from Last 3 Encounters:  06/24/23 98%  04/29/23 100%  10/31/22 96%      Physical Exam Vitals and nursing note reviewed.  Constitutional:      General: She is not in acute distress.    Appearance: Normal appearance. She is well-developed.  HENT:     Head: Normocephalic and atraumatic.  Eyes:     General: No scleral icterus.       Right eye: No discharge.        Left eye: No discharge.  Cardiovascular:     Rate and Rhythm: Normal rate and regular rhythm.     Heart sounds: No murmur heard. Pulmonary:     Effort: Pulmonary effort is normal. No respiratory distress.     Breath sounds: Normal breath sounds.  Musculoskeletal:        General: Normal range of motion.     Cervical back: Normal range of motion and neck supple.     Right lower leg: No edema.     Left lower leg: No edema.  Skin:    General: Skin is warm and dry.  Neurological:     Mental Status: She is alert and oriented to person, place, and time.   Psychiatric:        Mood and Affect: Mood normal.        Behavior: Behavior normal.        Thought Content: Thought content normal.        Judgment: Judgment normal.      No results found for any visits on 06/24/23.  Last CBC Lab Results  Component Value Date   WBC 4.1 02/13/2023   HGB 10.9 (L) 02/13/2023   HCT 32.9 (L) 02/13/2023   MCV 89.1 02/13/2023   MCH 29.4 08/04/2021   RDW 14.6 02/13/2023   PLT 250.0 02/13/2023   Last metabolic panel Lab Results  Component Value Date   GLUCOSE 67 (L) 04/29/2023   NA 140 04/29/2023   K 4.8 04/29/2023   CL 107 04/29/2023   CO2 26 04/29/2023   BUN 14 04/29/2023   CREATININE 1.24 (H) 04/29/2023   GFR 42.99 (L) 04/29/2023   CALCIUM 9.6 04/29/2023   PROT 6.8 04/29/2023   ALBUMIN 3.9 04/29/2023   LABGLOB 2.5 05/16/2021   AGRATIO 1.7 05/16/2021   BILITOT 0.3 04/29/2023   ALKPHOS 64 04/29/2023   AST 19 04/29/2023   ALT 13 04/29/2023   ANIONGAP 12 08/04/2021   Last lipids  Lab Results  Component Value Date   CHOL 137 10/08/2022   HDL 54.70 10/08/2022   LDLCALC 66 10/08/2022   TRIG 82.0 10/08/2022   CHOLHDL 3 10/08/2022   Last hemoglobin A1c Lab Results  Component Value Date   HGBA1C 6.2 01/23/2022   Last thyroid functions Lab Results  Component Value Date   TSH 2.18 10/08/2022   T4TOTAL 6.7 07/23/2019   Last vitamin D Lab Results  Component Value Date   VD25OH 34 01/24/2018   Last vitamin B12 and Folate Lab Results  Component Value Date   VITAMINB12 711 06/26/2018   FOLATE >24.0 02/28/2018      The 10-year ASCVD risk score (Arnett DK, et al., 2019) is: 37.1%    Assessment & Plan:   Problem List Items Addressed This Visit       Unprioritized   Uncontrolled type 2 diabetes mellitus with hyperglycemia (HCC)   Relevant Orders   Hemoglobin A1c   Microalbumin / creatinine urine ratio   Hyperlipidemia LDL goal <70 - Primary   Relevant Medications   spironolactone (ALDACTONE) 25 MG tablet   Other  Relevant Orders   Lipid panel   CBC with Differential/Platelet   Comprehensive metabolic panel   COPD with acute exacerbation (HCC)   Current every day smoker   Essential hypertension   Relevant Medications   spironolactone (ALDACTONE) 25 MG tablet   Weight loss   Relevant Medications   mirtazapine (REMERON) 7.5 MG tablet   Other Relevant Orders   Thyroid Panel With TSH   Early satiety   Other Visit Diagnoses     Hypertension, unspecified type       Relevant Medications   spironolactone (ALDACTONE) 25 MG tablet   Need for influenza vaccination       Relevant Orders   Flu Vaccine Trivalent High Dose (Fluad) (Completed)     Assessment and Plan    Grief and Weight Loss Significant weight loss and decreased appetite following the death of her child. Previously prescribed mirtazapine to stimulate appetite but it is unclear if the patient ever started this medication. -Resubmit prescription for mirtazapine to stimulate appetite and improve sleep. -Check weight and blood pressure in 2 weeks.  Hypertension Low blood pressure possibly due to weight loss and decreased oral intake. -Reduce spironolactone to as needed for edema. -Check blood pressure in 2 weeks.  Chronic Obstructive Pulmonary Disease Increased cough and sputum production. No fever or dysuria. -Continue current inhaler regimen. -Consider further evaluation if symptoms persist.  General Health Maintenance -Administer influenza vaccine today.        Return in about 2 weeks (around 07/08/2023), or if symptoms worsen or fail to improve.    Donato Schultz, DO

## 2023-06-25 LAB — CBC WITH DIFFERENTIAL/PLATELET
Basophils Absolute: 0 10*3/uL (ref 0.0–0.1)
Basophils Relative: 0.7 % (ref 0.0–3.0)
Eosinophils Absolute: 0.1 10*3/uL (ref 0.0–0.7)
Eosinophils Relative: 2.4 % (ref 0.0–5.0)
HCT: 37.1 % (ref 36.0–46.0)
Hemoglobin: 11.8 g/dL — ABNORMAL LOW (ref 12.0–15.0)
Lymphocytes Relative: 36.6 % (ref 12.0–46.0)
Lymphs Abs: 1.7 10*3/uL (ref 0.7–4.0)
MCHC: 31.8 g/dL (ref 30.0–36.0)
MCV: 91.2 fL (ref 78.0–100.0)
Monocytes Absolute: 0.4 10*3/uL (ref 0.1–1.0)
Monocytes Relative: 8 % (ref 3.0–12.0)
Neutro Abs: 2.4 10*3/uL (ref 1.4–7.7)
Neutrophils Relative %: 52.3 % (ref 43.0–77.0)
Platelets: 263 10*3/uL (ref 150.0–400.0)
RBC: 4.07 Mil/uL (ref 3.87–5.11)
RDW: 16 % — ABNORMAL HIGH (ref 11.5–15.5)
WBC: 4.6 10*3/uL (ref 4.0–10.5)

## 2023-06-25 LAB — COMPREHENSIVE METABOLIC PANEL
ALT: 17 U/L (ref 0–35)
AST: 24 U/L (ref 0–37)
Albumin: 4.1 g/dL (ref 3.5–5.2)
Alkaline Phosphatase: 68 U/L (ref 39–117)
BUN: 11 mg/dL (ref 6–23)
CO2: 26 meq/L (ref 19–32)
Calcium: 9.7 mg/dL (ref 8.4–10.5)
Chloride: 106 meq/L (ref 96–112)
Creatinine, Ser: 1.06 mg/dL (ref 0.40–1.20)
GFR: 51.84 mL/min — ABNORMAL LOW (ref >60.00)
Glucose, Bld: 102 mg/dL — ABNORMAL HIGH (ref 70–99)
Potassium: 5 meq/L (ref 3.5–5.1)
Sodium: 142 meq/L (ref 135–145)
Total Bilirubin: 0.4 mg/dL (ref 0.2–1.2)
Total Protein: 6.9 g/dL (ref 6.0–8.3)

## 2023-06-25 LAB — LIPID PANEL
Cholesterol: 149 mg/dL (ref 0–200)
HDL: 50.9 mg/dL (ref >39.00)
LDL Cholesterol: 81 mg/dL (ref 0–99)
NonHDL: 98.2
Total CHOL/HDL Ratio: 3
Triglycerides: 88 mg/dL (ref 0.0–149.0)
VLDL: 17.6 mg/dL (ref 0.0–40.0)

## 2023-06-25 LAB — MICROALBUMIN / CREATININE URINE RATIO
Creatinine,U: 184.2 mg/dL
Microalb Creat Ratio: 0.4 mg/g (ref 0.0–30.0)
Microalb, Ur: 0.8 mg/dL (ref 0.0–1.9)

## 2023-06-25 LAB — THYROID PANEL WITH TSH
Free Thyroxine Index: 2 (ref 1.4–3.8)
T3 Uptake: 30 % (ref 22–35)
T4, Total: 6.5 ug/dL (ref 5.1–11.9)
TSH: 1.82 m[IU]/L (ref 0.40–4.50)

## 2023-06-25 LAB — HEMOGLOBIN A1C: Hgb A1c MFr Bld: 6.3 % (ref 4.6–6.5)

## 2023-06-27 ENCOUNTER — Encounter (HOSPITAL_COMMUNITY): Payer: Self-pay

## 2023-06-27 ENCOUNTER — Other Ambulatory Visit: Payer: Self-pay | Admitting: Cardiovascular Disease

## 2023-06-27 DIAGNOSIS — E785 Hyperlipidemia, unspecified: Secondary | ICD-10-CM

## 2023-07-08 ENCOUNTER — Ambulatory Visit (INDEPENDENT_AMBULATORY_CARE_PROVIDER_SITE_OTHER): Payer: 59 | Admitting: Family Medicine

## 2023-07-08 ENCOUNTER — Ambulatory Visit (HOSPITAL_BASED_OUTPATIENT_CLINIC_OR_DEPARTMENT_OTHER)
Admission: RE | Admit: 2023-07-08 | Discharge: 2023-07-08 | Disposition: A | Discharge status: Home / Self Care | Payer: 59 | Source: Ambulatory Visit | Attending: Family Medicine | Admitting: Family Medicine

## 2023-07-08 ENCOUNTER — Other Ambulatory Visit (HOSPITAL_BASED_OUTPATIENT_CLINIC_OR_DEPARTMENT_OTHER): Payer: Self-pay

## 2023-07-08 ENCOUNTER — Encounter: Payer: Self-pay | Admitting: Family Medicine

## 2023-07-08 VITALS — BP 120/60 | HR 47 | Temp 97.1°F | Resp 16 | Ht 63.0 in | Wt 92.8 lb

## 2023-07-08 DIAGNOSIS — E785 Hyperlipidemia, unspecified: Secondary | ICD-10-CM

## 2023-07-08 DIAGNOSIS — I700 Atherosclerosis of aorta: Secondary | ICD-10-CM | POA: Diagnosis not present

## 2023-07-08 DIAGNOSIS — I10 Essential (primary) hypertension: Secondary | ICD-10-CM

## 2023-07-08 DIAGNOSIS — R918 Other nonspecific abnormal finding of lung field: Secondary | ICD-10-CM | POA: Diagnosis not present

## 2023-07-08 DIAGNOSIS — R634 Abnormal weight loss: Secondary | ICD-10-CM

## 2023-07-08 DIAGNOSIS — R051 Acute cough: Secondary | ICD-10-CM

## 2023-07-08 DIAGNOSIS — E1165 Type 2 diabetes mellitus with hyperglycemia: Secondary | ICD-10-CM

## 2023-07-08 DIAGNOSIS — J441 Chronic obstructive pulmonary disease with (acute) exacerbation: Secondary | ICD-10-CM

## 2023-07-08 DIAGNOSIS — R059 Cough, unspecified: Secondary | ICD-10-CM | POA: Diagnosis not present

## 2023-07-08 MED ORDER — TETANUS-DIPHTH-ACELL PERTUSSIS 5-2.5-18.5 LF-MCG/0.5 IM SUSY
0.5000 mL | PREFILLED_SYRINGE | Freq: Once | INTRAMUSCULAR | 0 refills | Status: AC
Start: 2023-07-08 — End: 2023-07-09
  Filled 2023-07-08: qty 0.5, 1d supply, fill #0

## 2023-07-08 MED ORDER — BENZONATATE 100 MG PO CAPS: 200.0000 mg | ORAL_CAPSULE | Freq: Three times a day (TID) | ORAL | 0 refills | Status: DC | PRN

## 2023-07-08 NOTE — Assessment & Plan Note (Signed)
Cont inhalers  F/u pulmonary cxr

## 2023-07-08 NOTE — Assessment & Plan Note (Signed)
Con't ensure  Eat 3 meals daily Weight stable

## 2023-07-08 NOTE — Patient Instructions (Signed)
Preventing Consequences of Unhealthy Weight Loss Behaviors, Adult Reaching and maintaining a healthy weight is important for your overall health. A healthy weight will vary from person to person. It is natural to want to lose weight quickly, using whatever methods seem fastest. However, losing weight in a healthy way is not a quick process. Instead, aim for slow, steady weight loss by making small changes and setting achievable goals. How can unhealthy weight loss behaviors affect me? Using unhealthy behaviors to try to lose weight can cause: Tiredness (fatigue), low heart rate, and low blood pressure. Imbalances in your body. These may be imbalances in: Electrolytes. These are salts and minerals in your blood. Chemicals. These are needed so your body can work properly. Body fluids. Loss of fluids may lead to dehydration. Organ damage or organ failure, especially affecting the kidneys. Thin bones that break easily. Loneliness or relationship problems with your friends and family. Emotional problems, including depression and anxiety. Changing unhealthy weight loss behaviors through lifestyle changes will improve your overall health. Maintaining a healthy weight also lowers your risk of certain conditions, such as: Obesity. Heart disease, high cholesterol, and high blood pressure. Type 2 diabetes. Breathing and sleeping disorders. Stroke. Osteoarthritis. This affects your joints. Osteoporosis. This affects your bones. Some cancers. What can increase my risk? Certain views or feelings about yourself and certain habits can increase your risk of unhealthy weight loss behaviors. These include: Having depression and being overweight as an adult. Attempting weight loss as a child or teen. Using alcohol, drugs, or tobacco products. What actions can I take to prevent these behaviors? You can make certain lifestyle changes to help you lose weight in a healthy way. These include eating nutritious  foods and exercising regularly. Nutrition  Eat a variety of healthy foods, including fruits and vegetables, whole grains, lean proteins, and low-fat dairy products. Drink water instead of sugary drinks such as juice, soda, or sports drinks. Drink enough fluid to keep your urine pale yellow. Plan healthy, balanced meals. Work with a dietitian to make a healthy meal plan that works for you. Limit the following: Foods that are high in fat, salt (sodium), or sugar. These include candy, donuts, pizza, and fast foods. Fried or heavily processed foods. Lifestyle Avoid these unhealthy eating habits: Following a diet that restricts entire types of food. This may be a popular diet that promises extreme results in a short time. Skipping meals to save calories. Not eating anything for long periods of time (fasting). Restricting your calories to far fewer than the number that you need to lose or maintain a healthy weight. Taking laxative pills to make you have more frequent bowel movements. Taking medicines to make your body lose excess fluids (diuretics). Eating an excessive amount of food and then making yourself vomit. This is known as bingeing and purging. Do not use any products that contain nicotine or tobacco. These products include cigarettes, chewing tobacco, and vaping devices, such as e-cigarettes. If you need help quitting, ask your health care provider. Alcohol use Do not drink alcohol if: Your health care provider tells you not to drink. You are pregnant, may be pregnant, or are planning to become pregnant. If you drink alcohol: Limit how much you have to: 0-1 drink a day for women. 0-2 drinks a day for men. Know how much alcohol is in your drink. In the [place] one drink equals one 12 oz bottle of beer (355 mL), one 5 oz glass of wine (148 mL), or one 1   oz glass of hard liquor (44 mL). Activity  Avoid compulsively getting an extreme amount of exercise. Work with a dietitian to make a  healthy exercise program. Include different types of exercise in your exercise program, such as strengthening, aerobic, and flexibility exercises. To maintain your weight, get at least 150 minutes of moderate-intensity exercise each week. Moderate-intensity exercise could be brisk walking or biking. To lose a healthy amount of weight, get 60 minutes of moderate-intensity exercise each day. Find ways to reduce stress, such as regular exercise or meditation. Find a hobby or other activity that you enjoy to distract you from eating when you feel stressed or bored. Where to find support For more support, talk with: Your health care provider or dietitian. Ask about support groups. A mental health care provider. Family and friends. Where to find more information Learn more about how to prevent complications from unhealthy weight loss behaviors from: Centers for Disease Control and Prevention: www.cdc.gov National Institute of Mental Health: www.nimh.nih.gov National Eating Disorders Association: www.nationaleatingdisorders.org Contact a health care provider if: You often feel very tired. You notice changes in your skin or your hair. You faint because of dehydration or too much exercise. You struggle to change your unhealthy weight loss behaviors on your own. Unhealthy weight loss behaviors are affecting your daily life or your relationships. You have signs or symptoms of an eating disorder. You have major weight changes in a short period of time. You feel guilty or ashamed about eating or exercising. Summary Using unhealthy eating behaviors to try to lose weight can cause a variety of physical and emotional problems that affect your overall health and well-being. Aim for slow, steady weight loss by choosing healthy foods, avoiding unhealthy eating habits, and exercising regularly. Contact your health care provider if you struggle to change your behaviors on your own or if you think that you may  have an eating disorder. This information is not intended to replace advice given to you by your health care provider. Make sure you discuss any questions you have with your health care provider. Document Revised: 04/11/2021 Document Reviewed: 04/11/2021 Elsevier Patient Education  2024 [employer]  

## 2023-07-08 NOTE — Progress Notes (Signed)
Established Patient Office Visit  Subjective   Patient ID: Christine Meyer, female    DOB: 1951/06/07  Age: 72 y.o. MRN: [id number]  Chief Complaint  Patient presents with   Weight Loss   Hypotension   Follow-up    HPI Discussed the use of AI scribe software for clinical note transcription with the patient, who gave verbal consent to proceed.  History of Present Illness   The patient, with a history of hypertension and lung disease, presents with unintentional weight loss and a persistent cough. She reports eating two meals a day and supplementing with Ensure, but has lost eight pounds since August. She lives with her grandson and manages her own meals. She has been coughing up white phlegm for a while, which has not improved with over-the-counter cough medicine. She also reports shortness of breath with exertion, which improves with rest. She uses albuterol as needed, but not regularly. She has been sleeping better since starting a new medication.      Patient Active Problem List   Diagnosis Date Noted   Early satiety 10/09/2022   IPF (idiopathic pulmonary fibrosis) (HCC) 07/11/2022   Preventative health care 01/23/2022   Hyperlipidemia associated with type 2 diabetes mellitus (HCC) 01/23/2022   Claudication in peripheral vascular disease (HCC) 12/08/2020   Mesenteric ischemia (HCC) 08/16/2020   Bronchitis 07/10/2020   COPD with acute exacerbation (HCC) 06/30/2020   Rheumatoid arthritis involving multiple sites (HCC) 01/21/2020   Coagulation disorder (HCC) 01/21/2020   Uncontrolled type 2 diabetes mellitus with hyperglycemia (HCC) 01/21/2020   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   Hyperlipidemia LDL goal <70 06/28/2018   Degenerative disc disease, cervical 03/27/2018   Gastric polyps - hyperplastic 02/23/2018   Antiplatelet or antithrombotic long-term use - clopidogrel - CAD/PAD 02/13/2018   Osteopenia 01/26/2018   Dyspepsia 01/26/2018   Rotator cuff  tear, left 01/24/2018   COPD (chronic obstructive pulmonary disease) (HCC) 07/26/2017   Weight loss 04/22/2017   Cough 04/22/2017   Current every day smoker 04/22/2017   Bilateral carotid artery disease (HCC) 07/04/2016   Claudication (HCC) 11/22/2014   Peripheral arterial disease (HCC) 10/22/2014   Renal artery stenosis, native, bilateral (HCC) 10/22/2014   Centrilobular emphysema (HCC) 10/19/2014   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   Pain in joint, lower leg 08/24/2014   Dyslipidemia 03/12/2011   Coronary atherosclerosis 09/19/2010   GERD 02/23/2010   Smoker 10/14/2009   EDEMA- LOCALIZED 04/06/2008   CAROTID BRUITS, BILATERAL 04/06/2008   Disorder resulting from impaired renal function 10/09/2007   DM (diabetes mellitus) type II, controlled, with peripheral vascular disorder (HCC) 07/07/2007   Normocytic anemia 07/07/2007   Essential hypertension 07/07/2007   HX, PERSONAL, MALIGNANCY, UTERUS NEC 07/07/2007   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   CAD (coronary artery disease)    Stent RCA 1999   Chronic kidney disease    COPD (chronic obstructive pulmonary disease) (HCC)    Diabetes mellitus, type 2 (HCC)    Diet controlled   Gastric polyps - hyperplastic 02/23/2018   Hyperlipidemia    Hypertension    OSA (obstructive sleep apnea) [name]    PVD (peripheral vascular disease) (HCC)    Carotid stenosis, renal artery stenosis   Sleep apnea    dx'd but doesn't use mask    Uterine cancer (HCC) ?3664'Q   Past Surgical History:  Procedure Laterality  Date   ABDOMINAL ANGIOGRAM  11/22/2014   Procedure: ABDOMINAL ANGIOGRAM;  Surgeon: Runell Gess, MD;  Location: [facility] CATH LAB;  Service: Cardiovascular;;   ABDOMINAL AORTOGRAM W/LOWER EXTREMITY N/A 10/03/2020   Procedure: ABDOMINAL AORTOGRAM W/LOWER EXTREMITY;  Surgeon: Runell Gess, MD;  Location: [facility] INVASIVE CV  LAB;  Service: Cardiovascular;  Laterality: N/A;   COLONOSCOPY  2000   negative   CORONARY ANGIOPLASTY WITH STENT PLACEMENT  1999   RCA; Dr. Elsie Lincoln G4 P4   ESOPHAGOGASTRODUODENOSCOPY     ILIAC ARTERY STENT Right 11/22/2014   LOWER EXTREMITY ANGIOGRAM N/A 11/22/2014   Procedure: LOWER EXTREMITY ANGIOGRAM;  Surgeon: Runell Gess, MD; L-SFA 100%, 3v runoff, R-pCIA 95>>0% w/ 8 mm x 18 mm long  Balloon expandable stent; R-dCIA 67>>0% w/ 8 mm x 4 cm long Cordis Smart Nitinol self  expanding stent    LOWER EXTREMITY ANGIOGRAPHY Bilateral 12/08/2020   Procedure: Lower Extremity Angiography;  Surgeon: Runell Gess, MD;  Location: [facility] INVASIVE CV LAB;  Service: Cardiovascular;  Laterality: Bilateral;   PERIPHERAL VASCULAR ATHERECTOMY Left 12/08/2020   Procedure: PERIPHERAL VASCULAR ATHERECTOMY;  Surgeon: Runell Gess, MD;  Location: [facility] INVASIVE CV LAB;  Service: Cardiovascular;  Laterality: Left;  common iliac   PERIPHERAL VASCULAR INTERVENTION Left 12/08/2020   Procedure: PERIPHERAL VASCULAR INTERVENTION;  Surgeon: Runell Gess, MD;  Location: [facility] INVASIVE CV LAB;  Service: Cardiovascular;  Laterality: Left;  common iliac   RENAL ARTERY STENT Right May 2007; 08/2006   ; restenosis   TOTAL ABDOMINAL HYSTERECTOMY  ?1191'Y    for uterine CA   VISCERAL ARTERY INTERVENTION N/A 10/03/2020   Procedure: VISCERAL ARTERY INTERVENTION;  Surgeon: Runell Gess, MD;  Location: [facility] INVASIVE CV LAB;  Service: Cardiovascular;  Laterality: N/A;  SMA   Social History   Tobacco Use   Smoking status: Every Day    Current packs/day: 0.25    Average packs/day: 0.3 packs/day for 51.0 years (12.8 ttl pk-yrs)    Types: Cigarettes    Passive exposure: Current   Smokeless tobacco: Never   Tobacco comments:    4 cigarettes per day/lt 10/31/22  Vaping Use   Vaping status: Never Used  Substance Use Topics   Alcohol use: No    Alcohol/week: 0.0 standard drinks of alcohol   Drug use: No   Social History    Socioeconomic History   Marital status: Single    Spouse name: Not on file   Number of children: 4   Years of education: Not on file   Highest education level: Not on file  Occupational History   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    Employer: [employer]  Tobacco Use   Smoking status: Every Day    Current packs/day: 0.25    Average packs/day: 0.3 packs/day for 51.0 years (12.8 ttl pk-yrs)    Types: Cigarettes    Passive exposure: Current   Smokeless tobacco: Never   Tobacco comments:    4 cigarettes per day/lt 10/31/22  Vaping Use   Vaping status: Never Used  Substance and Sexual Activity   Alcohol use: No    Alcohol/week: 0.0 standard drinks of alcohol   Drug use: No   Sexual activity: Not Currently  Other Topics Concern   Not on file  Social History Narrative      Daughter [name] is CMA Adult nurse primary care [facility]   Retired Information systems manager   Smoker, no EtOH/drugs  Social Determinants of Health   Financial Resource Strain: Low Risk  (04/30/2023)   Overall Financial Resource Strain (CARDIA)    Difficulty of Paying Living Expenses: Not very hard  Food Insecurity: No Food Insecurity (04/30/2023)   Hunger Vital Sign    Worried About Running Out of Food in the Last Year: Never true    Ran Out of Food in the Last Year: Never true  Transportation Needs: No Transportation Needs (04/30/2023)   PRAPARE - Administrator, Civil Service (Medical): No    Lack of Transportation (Non-Medical): No  Physical Activity: Inactive (07/20/2021)   Exercise Vital Sign    Days of Exercise per Week: 0 days    Minutes of Exercise per Session: 0 min  Stress: No Stress Concern Present (04/30/2023)   [employer] of Occupational Health - Occupational Stress Questionnaire    Feeling of Stress : Not at all  Social Connections: Moderately Isolated (07/20/2021)   Social Connection and Isolation Panel [NHANES]    Frequency of  Communication with Friends and Family: More than three times a week    Frequency of Social Gatherings with Friends and Family: More than three times a week    Attends Religious Services: More than 4 times per year    Active Member of [employer] or Organizations: No    Attends Banker Meetings: Never    Marital Status: Never married  Intimate Partner Violence: Not At Risk (04/30/2023)   Humiliation, Afraid, Rape, and Kick questionnaire    Fear of Current or Ex-Partner: No    Emotionally Abused: No    Physically Abused: No    Sexually Abused: No   Family Status  Relation Name Status   Mother  Deceased   Father  Deceased   Sister  (Not Specified)   Brother  Deceased   Brother  (Not Specified)   MGM  (Not Specified)   Daughter benita Ditmore Deceased at age 38       MI/ stroke   Daughter LUCY Alive   Mat Aunt  (Not Specified)   Oceanographer  (Not Specified)  No partnership data on file   Family History  Problem Relation Age of Onset   Heart failure Mother        after hip fx.    Hypertension Mother    Diabetes Mother    Deep vein thrombosis Mother    Hypertension Father    Diabetes Sister        insulin dependent   Hypertension Brother    Emphysema Brother    Diabetes Brother        insulin dependent   Diabetes Maternal Grandmother        non-insulin dependent   Stroke Daughter    Heart attack Daughter    Breast cancer Daughter    Brain cancer Daughter    Breast cancer Daughter    Diabetes Maternal Aunt        non-insulent dependent   Heart attack Paternal Aunt    No Known Allergies    Review of Systems  Constitutional:  Positive for weight loss. Negative for chills, fever and malaise/fatigue.  HENT:  Negative for congestion and hearing loss.   Eyes:  Negative for blurred vision and discharge.  Respiratory:  Negative for cough, sputum production and shortness of breath.   Cardiovascular:  Negative for chest pain, palpitations and leg swelling.   Gastrointestinal:  Negative for abdominal pain, blood in stool, constipation, diarrhea, heartburn, nausea and  vomiting.  Genitourinary:  Negative for dysuria, frequency, hematuria and urgency.  Musculoskeletal:  Negative for back pain, falls and myalgias.  Skin:  Negative for rash.  Neurological:  Negative for dizziness, sensory change, loss of consciousness, weakness and headaches.  Endo/Heme/Allergies:  Negative for environmental allergies. Does not bruise/bleed easily.  Psychiatric/Behavioral:  Negative for depression and suicidal ideas. The patient is not nervous/anxious and does not have insomnia.       Objective:     BP 120/60 (BP Location: Left Arm, Patient Position: Sitting, Cuff Size: Small)   Pulse (!) 47   Temp (!) 97.1 F (36.2 C) (Oral)   Resp 16   Ht 5\' 3"  (1.6 m)   Wt 92 lb 12.8 oz (42.1 kg)   SpO2 96%   BMI 16.44 kg/m  BP Readings from Last 3 Encounters:  07/08/23 120/60  06/24/23 118/60  04/29/23 (!) 128/58   Wt Readings from Last 3 Encounters:  07/08/23 92 lb 12.8 oz (42.1 kg)  06/24/23 92 lb 3.2 oz (41.8 kg)  04/29/23 100 lb 12.8 oz (45.7 kg)   SpO2 Readings from Last 3 Encounters:  07/08/23 96%  06/24/23 98%  04/29/23 100%      Physical Exam Vitals and nursing note reviewed.  Constitutional:      General: She is not in acute distress.    Appearance: Normal appearance. She is well-developed.  HENT:     Head: Normocephalic and atraumatic.  Eyes:     General: No scleral icterus.       Right eye: No discharge.        Left eye: No discharge.  Cardiovascular:     Rate and Rhythm: Normal rate and regular rhythm.     Heart sounds: No murmur heard. Pulmonary:     Effort: Pulmonary effort is normal. No respiratory distress.     Breath sounds: Normal breath sounds.  Musculoskeletal:        General: Normal range of motion.     Cervical back: Normal range of motion and neck supple.     Right lower leg: No edema.     Left lower leg: No edema.   Skin:    General: Skin is warm and dry.  Neurological:     Mental Status: She is alert and oriented to person, place, and time.  Psychiatric:        Mood and Affect: Mood normal.        Behavior: Behavior normal.        Thought Content: Thought content normal.        Judgment: Judgment normal.      No results found for any visits on 07/08/23.  Last CBC Lab Results  Component Value Date   WBC 4.6 06/24/2023   HGB 11.8 (L) 06/24/2023   HCT 37.1 06/24/2023   MCV 91.2 06/24/2023   MCH 29.4 08/04/2021   RDW 16.0 (H) 06/24/2023   PLT 263.0 06/24/2023   Last metabolic panel Lab Results  Component Value Date   GLUCOSE 102 (H) 06/24/2023   NA 142 06/24/2023   K 5.0 06/24/2023   CL 106 06/24/2023   CO2 26 06/24/2023   BUN 11 06/24/2023   CREATININE 1.06 06/24/2023   GFR 51.84 (L) 06/24/2023   CALCIUM 9.7 06/24/2023   PROT 6.9 06/24/2023   ALBUMIN 4.1 06/24/2023   LABGLOB 2.5 05/16/2021   AGRATIO 1.7 05/16/2021   BILITOT 0.4 06/24/2023   ALKPHOS 68 06/24/2023   AST 24 06/24/2023   ALT  17 06/24/2023   ANIONGAP 12 08/04/2021   Last lipids Lab Results  Component Value Date   CHOL 149 06/24/2023   HDL 50.90 06/24/2023   LDLCALC 81 06/24/2023   TRIG 88.0 06/24/2023   CHOLHDL 3 06/24/2023   Last hemoglobin A1c Lab Results  Component Value Date   HGBA1C 6.3 06/24/2023   Last thyroid functions Lab Results  Component Value Date   TSH 1.82 06/24/2023   T4TOTAL 6.5 06/24/2023   Last vitamin D Lab Results  Component Value Date   VD25OH 34 01/24/2018   Last vitamin B12 and Folate Lab Results  Component Value Date   VITAMINB12 711 06/26/2018   FOLATE >24.0 02/28/2018      The 10-year ASCVD risk score (Arnett DK, et al., 2019) is: 39.5%    Assessment & Plan:   Problem List Items Addressed This Visit       Unprioritized   Uncontrolled type 2 diabetes mellitus with hyperglycemia (HCC)   Hyperlipidemia LDL goal <70   COPD with acute exacerbation (HCC)    Relevant Medications   benzonatate (TESSALON PERLES) 100 MG capsule   Weight loss    Con't ensure  Eat 3 meals daily Weight stable       Essential hypertension    Well controlled, no changes to meds. Encouraged heart healthy diet such as the DASH diet and exercise as tolerated.        Cough - Primary    Cont inhalers  F/u pulmonary cxr      Relevant Medications   benzonatate (TESSALON PERLES) 100 MG capsule   Other Relevant Orders   DG Chest 2 View  Assessment and Plan    Unintentional Weight Loss Patient reports eating two meals a day and consuming Ensure 1-2 times daily. Weight stable since last visit but has lost 8 pounds since August. -Encourage increased caloric intake and continue current diet.  Chronic Cough Patient reports persistent cough with white phlegm. No signs of infection or other concerning symptoms. -Order chest x-ray to rule out any underlying lung conditions. -Prescribe cough pearls and send to [facility] pharmacy.  Shortness of Breath Patient reports shortness of breath with exertion and uses albuterol as needed. -Continue current management with albuterol as needed.  General Health Maintenance -Advise patient to receive Tetanus, COVID, and RSV vaccines at the pharmacy.        Return in about 3 months (around 10/08/2023), or if symptoms worsen or fail to improve, for weight check.    Donato Schultz, DO

## 2023-07-08 NOTE — Assessment & Plan Note (Signed)
Well controlled, no changes to meds. Encouraged heart healthy diet such as the DASH diet and exercise as tolerated.  °

## 2023-07-12 ENCOUNTER — Other Ambulatory Visit: Payer: Self-pay | Admitting: Cardiovascular Disease

## 2023-07-12 DIAGNOSIS — E785 Hyperlipidemia, unspecified: Secondary | ICD-10-CM

## 2023-07-15 ENCOUNTER — Other Ambulatory Visit: Payer: Self-pay | Admitting: Cardiovascular Disease

## 2023-07-15 ENCOUNTER — Ambulatory Visit (HOSPITAL_BASED_OUTPATIENT_CLINIC_OR_DEPARTMENT_OTHER)
Admission: RE | Admit: 2023-07-15 | Discharge: 2023-07-15 | Disposition: A | Discharge status: Home / Self Care | Payer: 59 | Source: Ambulatory Visit | Attending: Cardiovascular Disease | Admitting: Cardiovascular Disease

## 2023-07-15 ENCOUNTER — Ambulatory Visit (HOSPITAL_COMMUNITY)
Admission: RE | Admit: 2023-07-15 | Discharge: 2023-07-15 | Disposition: A | Discharge status: Home / Self Care | Payer: 59 | Source: Ambulatory Visit | Attending: Cardiovascular Disease | Admitting: Cardiovascular Disease

## 2023-07-15 ENCOUNTER — Encounter (HOSPITAL_COMMUNITY): Payer: Self-pay

## 2023-07-15 DIAGNOSIS — I6523 Occlusion and stenosis of bilateral carotid arteries: Secondary | ICD-10-CM | POA: Insufficient documentation

## 2023-07-15 DIAGNOSIS — E785 Hyperlipidemia, unspecified: Secondary | ICD-10-CM

## 2023-07-15 DIAGNOSIS — K559 Vascular disorder of intestine, unspecified: Secondary | ICD-10-CM | POA: Diagnosis not present

## 2023-07-15 DIAGNOSIS — Z95828 Presence of other vascular implants and grafts: Secondary | ICD-10-CM

## 2023-07-15 LAB — VAS US ABI WITH/WO TBI
Left ABI: 0.5
Right ABI: 0.5

## 2023-07-17 ENCOUNTER — Other Ambulatory Visit (HOSPITAL_COMMUNITY): Payer: Self-pay | Admitting: *Deleted

## 2023-07-17 DIAGNOSIS — Z95828 Presence of other vascular implants and grafts: Secondary | ICD-10-CM

## 2023-07-17 DIAGNOSIS — I739 Peripheral vascular disease, unspecified: Secondary | ICD-10-CM

## 2023-07-17 DIAGNOSIS — I701 Atherosclerosis of renal artery: Secondary | ICD-10-CM

## 2023-07-17 DIAGNOSIS — K559 Vascular disorder of intestine, unspecified: Secondary | ICD-10-CM

## 2023-07-22 ENCOUNTER — Other Ambulatory Visit (HOSPITAL_BASED_OUTPATIENT_CLINIC_OR_DEPARTMENT_OTHER): Payer: Self-pay

## 2023-07-22 ENCOUNTER — Other Ambulatory Visit: Payer: Self-pay | Admitting: Family Medicine

## 2023-07-22 MED ORDER — BD INTEGRA SYRINGE 25G X 1" 3 ML MISC
0 refills | Status: AC
  Filled 2023-07-22: qty 3, 90d supply, fill #0
  Filled 2023-10-08: qty 9, 270d supply, fill #1

## 2023-07-22 MED ORDER — CYANOCOBALAMIN 1000 MCG/ML IJ SOLN
1000.0000 ug | INTRAMUSCULAR | 0 refills | Status: DC
  Filled 2023-07-22: qty 1, 30d supply, fill #0

## 2023-07-22 MED ORDER — COMIRNATY 30 MCG/0.3ML IM SUSY
0.3000 mL | PREFILLED_SYRINGE | Freq: Once | INTRAMUSCULAR | 0 refills | Status: AC
  Filled 2023-07-22: qty 0.3, 1d supply, fill #0

## 2023-07-23 ENCOUNTER — Other Ambulatory Visit (HOSPITAL_BASED_OUTPATIENT_CLINIC_OR_DEPARTMENT_OTHER): Payer: Self-pay

## 2023-07-26 ENCOUNTER — Other Ambulatory Visit (HOSPITAL_BASED_OUTPATIENT_CLINIC_OR_DEPARTMENT_OTHER): Payer: Self-pay

## 2023-07-29 ENCOUNTER — Ambulatory Visit: Payer: 59 | Attending: Cardiovascular Disease | Admitting: Cardiovascular Disease

## 2023-07-29 ENCOUNTER — Encounter: Payer: Self-pay | Admitting: Cardiovascular Disease

## 2023-07-29 VITALS — BP 150/58 | HR 65 | Ht 61.0 in | Wt 95.2 lb

## 2023-07-29 DIAGNOSIS — I701 Atherosclerosis of renal artery: Secondary | ICD-10-CM | POA: Diagnosis not present

## 2023-07-29 DIAGNOSIS — F17200 Nicotine dependence, unspecified, uncomplicated: Secondary | ICD-10-CM

## 2023-07-29 DIAGNOSIS — E785 Hyperlipidemia, unspecified: Secondary | ICD-10-CM

## 2023-07-29 DIAGNOSIS — I2510 Atherosclerotic heart disease of native coronary artery without angina pectoris: Secondary | ICD-10-CM | POA: Diagnosis not present

## 2023-07-29 DIAGNOSIS — I6523 Occlusion and stenosis of bilateral carotid arteries: Secondary | ICD-10-CM | POA: Diagnosis not present

## 2023-07-29 DIAGNOSIS — K559 Vascular disorder of intestine, unspecified: Secondary | ICD-10-CM | POA: Diagnosis not present

## 2023-07-29 DIAGNOSIS — I10 Essential (primary) hypertension: Secondary | ICD-10-CM

## 2023-07-29 DIAGNOSIS — I739 Peripheral vascular disease, unspecified: Secondary | ICD-10-CM

## 2023-07-29 NOTE — Progress Notes (Signed)
07/29/2023 Christine Meyer   May 01, 1951  [id number]  Primary Physician Christine Schultz, DO Primary Cardiologist: Christine Gess MD Christine Meyer, [place]  HPI:  Christine Meyer is a 72 y.o.  thin appearing single African-American female mother of 1 living child (3 deceased), grandmother to 7 grandchildren. She was referred by Dr. Antoine Meyer , her cardiologist,  for peripheral vascular evaluation. I last saw her in the office 07/25/2022 unfortunately, her daughter Christine Meyer who came with her last year passed away Aug 04, 2023 of cancer.  She has a history of myocardial infarction back in 1999 undergoing stenting of her RCA by Dr. Elsie Meyer.her chronic risk factors are notable for continued tobacco abuse, treated hypertension and hyperlipidemia. She is complained of increasing dyspnea on exertion over the last 6 months as well as right calf claudication. Dr. Antoine Meyer saw her  and ordered a stress test. 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 She has had right renal artery stenting in the past as well as right external iliac artery stenting as well. 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 I stented both of these areas. She did have an occluded left SFA with high-grade segmental diffuse right SFA stenosis. Her Dopplers and symptoms improved.   She had been complaining of some abdominal pain and is experiencing some weight loss.  She saw Dr. Leone Meyer, her gastroenterologist who obtained an abdominal CTA and referred her to Dr. Lenell Meyer , vascular surgeon for consideration of peripheral angiography and treatment of mesenteric ischemia.  She had carotid Dopplers that showed moderate left ICA stenosis and lower extremity Dopplers performed today that suggested in-stent restenosis within her right  iliac stent.   I performed angiography on her 10/03/2020 revealing patent right iliac stent, total SFAs bilaterally, 90% calcified proximal left common iliac artery stenosis, subtotally occluded celiac axis at the origin and 90% SMA which I stented successfully.  She was discharged home the following day.  She did have a slight drop in her hemoglobin but no evidence of hematoma or retroperitoneal bleed.  Her follow-up Dopplers performed a week later showed a widely patent SMA.  Her abdominal pain resolved after the intervention and she had dinner in the hospital that evening without discomfort.  She states since gained 5 pounds.  She does complain of left lower extremity claudication however wishes to have this intervened on in the near future.   Since I saw her in the office a year ago she continues to do well.  She now smokes 1 cigarette a day.  Her Dopplers show bilateral internal carotid artery stenosis as well as progression of disease in her mesenteric arteries.  She currently denies chest pain, shortness of breath or claudication.  She also denies abdominal pain.   Current Meds  Medication Sig   ACCU-CHEK SOFTCLIX LANCETS lancets Check blood sugar once daily. Dx:E11.9   albuterol (PROVENTIL HFA) 108 (90 Base) MCG/ACT inhaler Inhale 1-2 puffs into the lungs every 6 (six) hours as needed for wheezing or shortness of breath.   aspirin EC 81 MG tablet Take 1 tablet (81 mg total) by mouth daily.   atorvastatin (LIPITOR) 80 MG tablet TAKE 1 TABLET BY MOUTH ONCE  DAILY   benzonatate (TESSALON PERLES) 100 MG capsule Take 2 capsules (200 mg total) by mouth 3 (three) times daily as  needed for cough.   Blood Glucose Monitoring Suppl (ONETOUCH VERIO) w/Device KIT Use daily to check blood sugar.   DX E11.9   Calcium 500-100 MG-UNIT CHEW Chew 1 tablet by mouth daily before breakfast.   Cholecalciferol (VITAMIN D3) 50 MCG (2000 UT) capsule Take 1 capsule (2,000 Units total) by mouth daily.   clopidogrel  (PLAVIX) 75 MG tablet TAKE 1 TABLET BY MOUTH DAILY   cyanocobalamin (VITAMIN B12) 1000 MCG/ML injection Inject 1 mL (1,000 mcg total) into the muscle every 30 (thirty) days.   FEROSUL 325 (65 Fe) MG tablet TAKE 1 TABLET BY MOUTH DAILY   glucose blood (ONETOUCH VERIO) test strip USE AS DIRECTED DAILY   guaiFENesin (MUCINEX) 600 MG 12 hr tablet Take 1 tablet (600 mg total) by mouth 2 (two) times daily.   loratadine (CLARITIN) 10 MG tablet Take 1 tablet (10 mg total) by mouth daily.   metoprolol succinate (TOPROL-XL) 25 MG 24 hr tablet TAKE 1 TABLET BY MOUTH DAILY  TAKE WITH OR IMMEDIATELY  FOLLOWING A MEAL   mirtazapine (REMERON) 7.5 MG tablet Take 1 tablet (7.5 mg total) by mouth at bedtime.   Multiple Vitamins-Minerals (A THRU Z SELECT 50+ ADVANCED) TABS TAKE 1 TABLET BY MOUTH DAILY (Patient taking differently: Take 1 tablet by mouth daily.)   olmesartan (BENICAR) 20 MG tablet TAKE 1 TABLET BY MOUTH DAILY   pantoprazole (PROTONIX) 40 MG tablet TAKE 1 TABLET(40 MG) BY MOUTH DAILY BEFORE BREAKFAST   Pirfenidone 801 MG TABS Take 1 tablet (801 mg total) by mouth 3 (three) times daily with meals.   spironolactone (ALDACTONE) 25 MG tablet 1/2-1 po every day as needed   STIOLTO RESPIMAT 2.5-2.5 MCG/ACT AERS USE 2 INHALATIONS BY MOUTH DAILY   sucralfate (CARAFATE) 1 g tablet Take 1 tablet (1 g total) by mouth 4 (four) times daily -  with meals and at bedtime.   SYRINGE-NEEDLE, DISP, 3 ML (BD INTEGRA SYRINGE) 25G X 1" 3 ML MISC Use to inject b12 once a month.     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    Employer: [employer]  Tobacco Use   Smoking status: Every Day    Current packs/day: 0.25    Average packs/day:  0.3 packs/day for 51.0 years (12.8 ttl pk-yrs)    Types: Cigarettes    Passive exposure: Current   Smokeless tobacco: Never   Tobacco comments:    4 cigarettes per day/lt 10/31/22  Vaping Use   Vaping status: Never Used  Substance and Sexual Activity   Alcohol use: No    Alcohol/week: 0.0 standard drinks of alcohol   Drug use: No   Sexual activity: Not Currently  Other Topics Concern   Not on file  Social History Narrative      Daughter Christine Meyer is CMA Adult nurse primary care [employer]   Retired Information systems manager   Smoker, no EtOH/drugs   Social Determinants of Health   Financial Resource Strain: Low Risk  (04/30/2023)   Overall Financial Resource Strain (CARDIA)    Difficulty of Paying Living Expenses: Not very hard  Food Insecurity: No Food Insecurity (04/30/2023)   Hunger Vital Sign    Worried About Running  Out of Food in the Last Year: Never true    Ran Out of Food in the Last Year: Never true  Transportation Needs: No Transportation Needs (04/30/2023)   PRAPARE - Administrator, Civil Service (Medical): No    Lack of Transportation (Non-Medical): No  Physical Activity: Inactive (07/20/2021)   Exercise Vital Sign    Days of Exercise per Week: 0 days    Minutes of Exercise per Session: 0 min  Stress: No Stress Concern Present (04/30/2023)   [employer] of Occupational Health - Occupational Stress Questionnaire    Feeling of Stress : Not at all  Social Connections: Moderately Isolated (07/20/2021)   Social Connection and Isolation Panel [NHANES]    Frequency of Communication with Friends and Family: More than three times a week    Frequency of Social Gatherings with Friends and Family: More than three times a week    Attends Religious Services: More than 4 times per year    Active Member of [employer] or Organizations: No    Attends Banker Meetings: Never    Marital Status: Never married  Intimate Partner Violence: Not At Risk (04/30/2023)   Humiliation,  Afraid, Rape, and Kick questionnaire    Fear of Current or Ex-Partner: No    Emotionally Abused: No    Physically Abused: No    Sexually Abused: No     Review of Systems: General: negative for chills, fever, night sweats or weight changes.  Cardiovascular: negative for chest pain, dyspnea on exertion, edema, orthopnea, palpitations, paroxysmal nocturnal dyspnea or shortness of breath Dermatological: negative for rash Respiratory: negative for cough or wheezing Urologic: negative for hematuria Abdominal: negative for nausea, vomiting, diarrhea, bright red blood per rectum, melena, or hematemesis Neurologic: negative for visual changes, syncope, or dizziness All other systems reviewed and are otherwise negative except as noted above.    Blood pressure (!) 150/58, pulse 65, height 5\' 1"  (1.549 m), weight 95 lb 3.2 oz (43.2 kg).  General appearance: alert and no distress Neck: no adenopathy, no carotid bruit, no JVD, supple, symmetrical, trachea midline, and thyroid not enlarged, symmetric, no tenderness/mass/nodules Lungs: clear to auscultation bilaterally Heart: regular rate and rhythm, S1, S2 normal, no murmur, click, rub or gallop Extremities: extremities normal, atraumatic, no cyanosis or edema Pulses: Decreased pedal pulses Skin: Skin color, texture, turgor normal. No rashes or lesions Neurologic: Grossly normal female  EKG EKG Interpretation Date/Time:  Monday July 29 2023 09:53:50 EST Ventricular Rate:  65 PR Interval:  132 QRS Duration:  80 QT Interval:  386 QTC Calculation: 401 R Axis:   61  Text Interpretation: Normal sinus rhythm with sinus arrhythmia Normal ECG When compared with ECG of 04-Aug-2021 15:13, PREVIOUS ECG IS PRESENT Confirmed by Nanetta Batty [phone]) on 07/29/2023 10:13:27 AM    ASSESSMENT AND PLAN:   Smoker Long history of tobacco use currently smoking only 1 cigarette a day.  Essential hypertension History of essential hypertension her  blood pressure measured today at 150/58.  She is on Benicar and spironolactone.  CAROTID BRUITS, BILATERAL History of carotid artery disease by duplex ultrasound last performed 11//24 revealing moderate bilateral ICA stenosis.  This will be repeated on annual basis.  Coronary atherosclerosis History of CAD status post myocardial infarction back in 1999 with stenting of her RCA by Dr. Elsie Meyer.  She has not had any coronary disease since that time.  Dyslipidemia History of dyslipidemia on statin therapy with lipid profile performed 06/24/2023 revealed a total cholesterol  of 149, LDL of 81 and HDL of 50.  Peripheral arterial disease (HCC) History of PAD status post bilateral iliac stenting with known bilateral SFA occlusion.  Her most recent Doppler studies performed 11//24 revealed patent iliac stents with ABIs in the 0.5 range bilaterally.  She denies claudication.  Renal artery stenosis, native, bilateral (HCC) History of renal artery stenosis status post right renal artery stenting in the past with known occlusion of this by angiography.    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  Her Dopplers show progressive disease although she is completely asymptomatic.     Christine Gess MD FACP,FACC,FAHA, [facility] 07/29/2023 10:28 AM

## 2023-07-29 NOTE — Assessment & Plan Note (Signed)
Long history of tobacco use currently smoking only 1 cigarette a day.

## 2023-07-29 NOTE — Assessment & Plan Note (Signed)
History of dyslipidemia on statin therapy with lipid profile performed 06/24/2023 revealed a total cholesterol of 149, LDL of 81 and HDL of 50.

## 2023-07-29 NOTE — Assessment & Plan Note (Signed)
History of renal artery stenosis status post right renal artery stenting in the past with known occlusion of this by angiography.

## 2023-07-29 NOTE — Assessment & Plan Note (Signed)
History of PAD status post bilateral iliac stenting with known bilateral SFA occlusion.  Her most recent Doppler studies performed 11//24 revealed patent iliac stents with ABIs in the 0.5 range bilaterally.  She denies claudication.

## 2023-07-29 NOTE — Assessment & Plan Note (Signed)
History of carotid artery disease by duplex ultrasound last performed 11//24 revealing moderate bilateral ICA stenosis.  This will be repeated on annual basis.

## 2023-07-29 NOTE — Assessment & Plan Note (Signed)
History of essential hypertension her blood pressure measured today at 150/58.  She is on Benicar and spironolactone.

## 2023-07-29 NOTE — Assessment & Plan Note (Signed)
History of mesenteric ischemia ischemia with known subtotally occluded celiac axis with high-grade SMA stenosis which I ultimately stented because of mesenteric ischemia 09/29/2020.  Her Dopplers show progressive disease although she is completely asymptomatic.

## 2023-07-29 NOTE — Assessment & Plan Note (Signed)
History of CAD status post myocardial infarction back in 1999 with stenting of her RCA by Dr. Elsie Lincoln.  She has not had any coronary disease since that time.

## 2023-07-29 NOTE — Patient Instructions (Addendum)
Medication Instructions:  Your physician recommends that you continue on your current medications as directed. Please refer to the Current Medication list given to you today.  *If you need a refill on your cardiac medications before your next appointment, please call your pharmacy*   Testing/Procedures: Your physician has requested that you have an Aorta/Iliac Duplex. This will be take place at [address] [facility], [address].  No food after 11PM the night before.  Water is OK. (Don't drink liquids if you have been instructed not to for ANOTHER test) Avoid foods that produce bowel gas, for 24 hours prior to exam (see below). No breakfast, no chewing gum, no smoking or carbonated beverages. Patient may take morning medications with water. Come in for test at least 15 minutes early to register. **To do in November 2025**  Please note: We ask at that you not bring children with you during ultrasound (echo/ vascular) testing. Due to room size and safety concerns, children are not allowed in the ultrasound rooms during exams. Our front office staff cannot provide observation of children in our lobby area while testing is being conducted. An adult accompanying a patient to their appointment will only be allowed in the ultrasound room at the discretion of the ultrasound technician under special circumstances. We apologize for any inconvenience.  Your physician has requested that you have an Mesenteric Ultrasound. This will be take place at [address] [facility], [address].  No food after 11PM the night before.  Water is OK. (Don't drink liquids if you have been instructed not to for ANOTHER test). Avoid foods that produce bowel gas, for 24 hours prior to exam (see below). No breakfast, no chewing gum, no smoking or carbonated beverages. Patient may take morning medications with water. You may have to drink Ensure during this test. Come in for test at least 15 minutes early to register. **To do in  November 2025**   Please note: We ask at that you not bring children with you during ultrasound (echo/ vascular) testing. Due to room size and safety concerns, children are not allowed in the ultrasound rooms during exams. Our front office staff cannot provide observation of children in our lobby area while testing is being conducted. An adult accompanying a patient to their appointment will only be allowed in the ultrasound room at the discretion of the ultrasound technician under special circumstances. We apologize for any inconvenience.  Your physician has requested that you have an ankle brachial index (ABI). During this test an ultrasound and blood pressure cuff are used to evaluate the arteries that supply the arms and legs with blood. Allow thirty minutes for this exam. There are no restrictions or special instructions. This will take place at [address] [facility], [address].  **To do in November 2025**   Please note: We ask at that you not bring children with you during ultrasound (echo/ vascular) testing. Due to room size and safety concerns, children are not allowed in the ultrasound rooms during exams. Our front office staff cannot provide observation of children in our lobby area while testing is being conducted. An adult accompanying a patient to their appointment will only be allowed in the ultrasound room at the discretion of the ultrasound technician under special circumstances. We apologize for any inconvenience.  Your physician has requested that you have a carotid duplex. This test is an ultrasound of the carotid arteries in your neck. It looks at blood flow through these arteries that supply the brain with blood.  Allow one hour for this exam. There are no restrictions or special instructions. This will take place at [address] [facility], [address].  **To do in November 2025**  Please note: We ask at that you not bring children with you during ultrasound (echo/ vascular) testing. Due to  room size and safety concerns, children are not allowed in the ultrasound rooms during exams. Our front office staff cannot provide observation of children in our lobby area while testing is being conducted. An adult accompanying a patient to their appointment will only be allowed in the ultrasound room at the discretion of the ultrasound technician under special circumstances. We apologize for any inconvenience.    Follow-Up: At [facility], you and your health needs are our priority.  As part of our continuing mission to provide you with exceptional heart care, we have created designated Provider Care Teams.  These Care Teams include your primary Cardiologist (physician) and Advanced Practice Providers (APPs -  Physician Assistants and Nurse Practitioners) who all work together to provide you with the care you need, when you need it.  We recommend signing up for the patient portal called "MyChart".  Sign up information is provided on this After Visit Summary.  MyChart is used to connect with patients for Virtual Visits (Telemedicine).  Patients are able to view lab/test results, encounter notes, upcoming appointments, etc.  Non-urgent messages can be sent to your provider as well.   To learn more about what you can do with MyChart, go to [email or web address].    Your next appointment:   12 month(s)  Provider:   Nanetta Batty, MD

## 2023-07-30 ENCOUNTER — Other Ambulatory Visit (HOSPITAL_BASED_OUTPATIENT_CLINIC_OR_DEPARTMENT_OTHER): Payer: Self-pay

## 2023-07-31 ENCOUNTER — Other Ambulatory Visit (HOSPITAL_BASED_OUTPATIENT_CLINIC_OR_DEPARTMENT_OTHER): Payer: Self-pay

## 2023-08-01 ENCOUNTER — Ambulatory Visit: Payer: 59

## 2023-08-01 VITALS — Ht 61.0 in | Wt 95.0 lb

## 2023-08-01 DIAGNOSIS — Z0000 Encounter for general adult medical examination without abnormal findings: Secondary | ICD-10-CM | POA: Diagnosis not present

## 2023-08-01 NOTE — Patient Instructions (Addendum)
Ms. Mages , Thank you for taking time to come for your Medicare Wellness Visit. I appreciate your ongoing commitment to your health goals. Please review the following plan we discussed and let me know if I can assist you in the future.   Referrals/Orders/Follow-Ups/Clinician Recommendations:   This is a list of the screening recommended for you and due dates:  Health Maintenance  Topic Date Due   Zoster (Shingles) Vaccine (1 of 2) Never done   Eye exam for diabetics  01/19/2021   Complete foot exam   05/01/2022   COVID-19 Vaccine (5 - 2023-24 season) 11/19/2023   Hemoglobin A1C  12/23/2023   Mammogram  02/21/2024   Yearly kidney function blood test for diabetes  06/23/2024   Yearly kidney health urinalysis for diabetes  06/23/2024   Medicare Annual Wellness Visit  07/31/2024   Colon Cancer Screening  02/19/2028   DTaP/Tdap/Td vaccine (3 - Td or Tdap) 07/07/2033   Pneumonia Vaccine  Completed   Flu Shot  Completed   DEXA scan (bone density measurement)  Completed   Hepatitis C Screening  Completed   HPV Vaccine  Aged Out    Advanced directives: (In Chart) A copy of your advanced directives are scanned into your chart should your provider ever need it.  Next Medicare Annual Wellness Visit scheduled for next year: Yes

## 2023-08-01 NOTE — Progress Notes (Signed)
Subjective:   Christine Meyer is a 72 y.o. female who presents for Medicare Annual (Subsequent) preventive examination.  Visit Complete: Virtual I connected with  [name] on 08/01/23 by a audio enabled telemedicine application and verified that I am speaking with the correct person using two identifiers.  Patient Location: Home  Provider Location: Home Office  I discussed the limitations of evaluation and management by telemedicine. The patient expressed understanding and agreed to proceed.  Vital Signs: Because this visit was a virtual/telehealth visit, some criteria may be missing or patient reported. Any vitals not documented were not able to be obtained and vitals that have been documented are patient reported.    Cardiac Risk Factors include: advanced age (>43men, >46 women);diabetes mellitus;hypertension;smoking/ tobacco exposure     Objective:    Today's Vitals   08/01/23 0817 08/01/23 0818  Weight: 95 lb (43.1 kg)   Height: 5\' 1"  (1.549 m)   PainSc:  0-No pain   Body mass index is 17.95 kg/m.     08/01/2023    8:27 AM 07/23/2022    9:42 AM 08/04/2021   12:59 PM 07/20/2021    9:05 AM 12/08/2020    7:54 AM 10/04/2020    6:08 AM 10/03/2020    9:24 AM  Advanced Directives  Does Patient Have a Medical Advance Directive? Yes Yes No No No No No  Type of Estate agent of [place];Living will Healthcare Power of [place];Living will       Does patient want to make changes to medical advance directive? No - Patient declined No - Patient declined       Copy of Healthcare Power of Attorney in Chart? Yes - validated most recent copy scanned in chart (See row information) Yes - validated most recent copy scanned in chart (See row information)       Would patient like information on creating a medical advance directive?   No - Patient declined Yes (MAU/Ambulatory/Procedural Areas - Information given) No - Patient declined No - Patient declined No -  Patient declined    Current Medications (verified) Outpatient Encounter Medications as of 08/01/2023  Medication Sig   ACCU-CHEK SOFTCLIX LANCETS lancets Check blood sugar once daily. Dx:E11.9   albuterol (PROVENTIL HFA) 108 (90 Base) MCG/ACT inhaler Inhale 1-2 puffs into the lungs every 6 (six) hours as needed for wheezing or shortness of breath.   aspirin EC 81 MG tablet Take 1 tablet (81 mg total) by mouth daily.   atorvastatin (LIPITOR) 80 MG tablet TAKE 1 TABLET BY MOUTH ONCE  DAILY   benzonatate (TESSALON PERLES) 100 MG capsule Take 2 capsules (200 mg total) by mouth 3 (three) times daily as needed for cough.   Blood Glucose Monitoring Suppl (ONETOUCH VERIO) w/Device KIT Use daily to check blood sugar.   DX E11.9   Calcium 500-100 MG-UNIT CHEW Chew 1 tablet by mouth daily before breakfast.   Cholecalciferol (VITAMIN D3) 50 MCG (2000 UT) capsule Take 1 capsule (2,000 Units total) by mouth daily.   clopidogrel (PLAVIX) 75 MG tablet TAKE 1 TABLET BY MOUTH DAILY   cyanocobalamin (VITAMIN B12) 1000 MCG/ML injection Inject 1 mL (1,000 mcg total) into the muscle every 30 (thirty) days.   FEROSUL 325 (65 Fe) MG tablet TAKE 1 TABLET BY MOUTH DAILY   glucose blood (ONETOUCH VERIO) test strip USE AS DIRECTED DAILY   guaiFENesin (MUCINEX) 600 MG 12 hr tablet Take 1 tablet (600 mg total) by mouth 2 (two) times daily.  loratadine (CLARITIN) 10 MG tablet Take 1 tablet (10 mg total) by mouth daily.   metoprolol succinate (TOPROL-XL) 25 MG 24 hr tablet TAKE 1 TABLET BY MOUTH DAILY  TAKE WITH OR IMMEDIATELY  FOLLOWING A MEAL   mirtazapine (REMERON) 7.5 MG tablet Take 1 tablet (7.5 mg total) by mouth at bedtime.   Multiple Vitamins-Minerals (A THRU Z SELECT 50+ ADVANCED) TABS TAKE 1 TABLET BY MOUTH DAILY (Patient taking differently: Take 1 tablet by mouth daily.)   olmesartan (BENICAR) 20 MG tablet TAKE 1 TABLET BY MOUTH DAILY   pantoprazole (PROTONIX) 40 MG tablet TAKE 1 TABLET(40 MG) BY MOUTH DAILY  BEFORE BREAKFAST   Pirfenidone 801 MG TABS Take 1 tablet (801 mg total) by mouth 3 (three) times daily with meals.   spironolactone (ALDACTONE) 25 MG tablet 1/2-1 po every day as needed   STIOLTO RESPIMAT 2.5-2.5 MCG/ACT AERS USE 2 INHALATIONS BY MOUTH DAILY   sucralfate (CARAFATE) 1 g tablet Take 1 tablet (1 g total) by mouth 4 (four) times daily -  with meals and at bedtime.   SYRINGE-NEEDLE, DISP, 3 ML (BD INTEGRA SYRINGE) 25G X 1" 3 ML MISC Use to inject b12 once a month.   No facility-administered encounter medications on file as of 08/01/2023.    Allergies (verified) Patient has no known allergies.   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   CAD (coronary artery disease)    Stent RCA 1999   Chronic kidney disease    COPD (chronic obstructive pulmonary disease) (HCC)    Diabetes mellitus, type 2 (HCC)    Diet controlled   Gastric polyps - hyperplastic 02/23/2018   Hyperlipidemia    Hypertension    OSA (obstructive sleep apnea) [name]    PVD (peripheral vascular disease) (HCC)    Carotid stenosis, renal artery stenosis   Sleep apnea    dx'd but doesn't use mask    Uterine cancer (HCC) ?6045'W   Past Surgical History:  Procedure Laterality Date   ABDOMINAL ANGIOGRAM  11/22/2014   Procedure: ABDOMINAL ANGIOGRAM;  Surgeon: Runell Gess, MD;  Location: [facility] CATH LAB;  Service: Cardiovascular;;   ABDOMINAL AORTOGRAM W/LOWER EXTREMITY N/A 10/03/2020   Procedure: ABDOMINAL AORTOGRAM W/LOWER EXTREMITY;  Surgeon: Runell Gess, MD;  Location: [facility] INVASIVE CV LAB;  Service: Cardiovascular;  Laterality: N/A;   COLONOSCOPY  2000   negative   CORONARY ANGIOPLASTY WITH STENT PLACEMENT  1999   RCA; Dr. Elsie Lincoln G4 P4   ESOPHAGOGASTRODUODENOSCOPY     ILIAC ARTERY STENT Right 11/22/2014   LOWER EXTREMITY ANGIOGRAM N/A 11/22/2014   Procedure: LOWER EXTREMITY ANGIOGRAM;  Surgeon:  Runell Gess, MD; L-SFA 100%, 3v runoff, R-pCIA 95>>0% w/ 8 mm x 18 mm long  Balloon expandable stent; R-dCIA 67>>0% w/ 8 mm x 4 cm long Cordis Smart Nitinol self  expanding stent    LOWER EXTREMITY ANGIOGRAPHY Bilateral 12/08/2020   Procedure: Lower Extremity Angiography;  Surgeon: Runell Gess, MD;  Location: [facility] INVASIVE CV LAB;  Service: Cardiovascular;  Laterality: Bilateral;   PERIPHERAL VASCULAR ATHERECTOMY Left 12/08/2020   Procedure: PERIPHERAL VASCULAR ATHERECTOMY;  Surgeon: Runell Gess, MD;  Location: [facility] INVASIVE CV LAB;  Service: Cardiovascular;  Laterality: Left;  common iliac   PERIPHERAL VASCULAR INTERVENTION Left 12/08/2020   Procedure: PERIPHERAL VASCULAR INTERVENTION;  Surgeon: Runell Gess, MD;  Location: [facility] INVASIVE  CV LAB;  Service: Cardiovascular;  Laterality: Left;  common iliac   RENAL ARTERY STENT Right May 2007; 08/2006   ; restenosis   TOTAL ABDOMINAL HYSTERECTOMY  ?4098'J    for uterine CA   VISCERAL ARTERY INTERVENTION N/A 10/03/2020   Procedure: VISCERAL ARTERY INTERVENTION;  Surgeon: Runell Gess, MD;  Location: [facility] INVASIVE CV LAB;  Service: Cardiovascular;  Laterality: N/A;  SMA   Family History  Problem Relation Age of Onset   Heart failure Mother        after hip fx.    Hypertension Mother    Diabetes Mother    Deep vein thrombosis Mother    Hypertension Father    Diabetes Sister        insulin dependent   Hypertension Brother    Emphysema Brother    Diabetes Brother        insulin dependent   Diabetes Maternal Grandmother        non-insulin dependent   Stroke Daughter    Heart attack Daughter    Breast cancer Daughter    Brain cancer Daughter    Breast cancer Daughter    Diabetes Maternal Aunt        non-insulent dependent   Heart attack Paternal Aunt    Social History   Socioeconomic History   Marital status: Single    Spouse name: Not on file   Number of children: 4   Years of education: Not on file   Highest  education level: Not on file  Occupational History   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    Employer: [employer]  Tobacco Use   Smoking status: Every Day    Current packs/day: 0.25    Average packs/day: 0.3 packs/day for 51.0 years (12.8 ttl pk-yrs)    Types: Cigarettes    Passive exposure: Current   Smokeless tobacco: Never   Tobacco comments:    4 cigarettes per day/lt 10/31/22  Vaping Use   Vaping status: Never Used  Substance and Sexual Activity   Alcohol use: No    Alcohol/week: 0.0 standard drinks of alcohol   Drug use: No   Sexual activity: Not Currently  Other Topics Concern   Not on file  Social History Narrative      Daughter [name] is CMA Adult nurse primary care [place]   Retired Information systems manager   Smoker, no EtOH/drugs   Social Determinants of Health   Financial Resource Strain: Low Risk  (08/01/2023)   Overall Financial Resource Strain (CARDIA)    Difficulty of Paying Living Expenses: Not hard at all  Food Insecurity: No Food Insecurity (08/01/2023)   Hunger Vital Sign    Worried About Running Out of Food in the Last Year: Never true    Ran Out of Food in the Last Year: Never true  Transportation Needs: No Transportation Needs (08/01/2023)   PRAPARE - Administrator, Civil Service (Medical): No    Lack of Transportation (Non-Medical): No  Physical Activity: Inactive (08/01/2023)   Exercise Vital Sign    Days of Exercise per Week: 0 days    Minutes of Exercise per Session: 0 min  Stress: No Stress Concern Present (08/01/2023)   [employer] of Occupational Health - Occupational Stress Questionnaire    Feeling of Stress : Not at all  Social Connections: Moderately Integrated (08/01/2023)   Social Connection and Isolation Panel [NHANES]    Frequency of Communication with Friends and Family:  More than three times a week    Frequency of Social Gatherings with Friends and Family: More than three  times a week    Attends Religious Services: More than 4 times per year    Active Member of Clubs or Organizations: Yes    Attends Engineer, structural: More than 4 times per year    Marital Status: Never married    Tobacco Counseling Ready to quit: Yes Counseling given: Yes Tobacco comments: 4 cigarettes per day/lt 10/31/22   Clinical Intake:  Pre-visit preparation completed: Yes  Pain : No/denies pain Pain Score: 0-No pain     BMI - recorded: 17.95 Nutritional Status: BMI <19  Underweight Nutritional Risks: None Diabetes: Yes CBG done?: No Did pt. bring in CBG monitor from home?: No  How often do you need to have someone help you when you read instructions, pamphlets, or other written materials from your doctor or pharmacy?: 1 - Never  Interpreter Needed?: No  Information entered by :: Theresa Mulligan LPN   Activities of Daily Living    08-29-23    8:25 AM  In your present state of health, do you have any difficulty performing the following activities:  Hearing? 0  Vision? 0  Difficulty concentrating or making decisions? 0  Walking or climbing stairs? 0  Dressing or bathing? 0  Doing errands, shopping? 0  Preparing Food and eating ? N  Using the Toilet? N  In the past six months, have you accidently leaked urine? N  Do you have problems with loss of bowel control? N  Managing your Medications? N  Managing your Finances? N  Housekeeping or managing your Housekeeping? N    Patient Care Team: Zola Button, Grayling Congress, DO as PCP - General (Family Medicine) Runell Gess, MD as PCP - Cardiology (Cardiology) Nelson Chimes, MD as Consulting Physician (Ophthalmology) Lupita Leash, MD (Inactive) as Consulting Physician (Pulmonary Disease) Runell Gess, MD as Consulting Physician (Cardiology)  Indicate any recent Medical Services you may have received from other than [facility] providers in the past year (date may be approximate).      Assessment:   This is a routine wellness examination for Demitria.  Hearing/Vision screen Hearing Screening - Comments:: Denies hearing difficulties   Vision Screening - Comments:: Wears rx glasses - up to date with routine eye exams with  [facility]   Goals Addressed               This Visit's Progress     Quit smoking / using tobacco (pt-stated)         Depression Screen    08/29/23    8:24 AM 06/24/2023    1:44 PM 06/24/2023    1:43 PM 07/23/2022    9:45 AM 01/23/2022   10:48 AM 07/20/2021    9:10 AM 01/09/2021    9:16 AM  PHQ 2/9 Scores  PHQ - 2 Score 0 0  0 1 0 0  Exception Documentation  Patient refusal Other- indicate reason in comment box      Not completed   Pt here to discuss daughter's death.        Fall Risk    29-Aug-2023    8:39 AM 06/24/2023    1:43 PM 10/08/2022    3:19 PM 07/23/2022    9:42 AM 01/23/2022   10:48 AM  Fall Risk   Falls in the past year? 0 0 0 0 0  Number falls in past yr: 0  0 0 0 0  Injury with Fall? 0 0 0 0 0  Risk for fall due to : No Fall Risks   No Fall Risks   Follow up Falls prevention discussed Falls evaluation completed Falls evaluation completed Falls evaluation completed Falls evaluation completed    MEDICARE RISK AT HOME: Medicare Risk at Home Any stairs in or around the home?: No If so, are there any without handrails?: No Home free of loose throw rugs in walkways, pet beds, electrical cords, etc?: Yes Adequate lighting in your home to reduce risk of falls?: Yes Life alert?: No Use of a cane, walker or w/c?: No Grab bars in the bathroom?: No Shower chair or bench in shower?: No Elevated toilet seat or a handicapped toilet?: No  TIMED UP AND GO:  Was the test performed?  No    Cognitive Function:    10/15/2016   10:50 AM  MMSE - Mini Mental State Exam  Orientation to time 5  Orientation to Place 5  Registration 3  Attention/ Calculation 4  Recall 3  Language- name 2 objects 2  Language- repeat 1   Language- follow 3 step command 3  Language- read & follow direction 1  Write a sentence 1  Copy design 0  Total score 28        08/01/2023    8:27 AM 07/23/2022    9:50 AM  6CIT Screen  What Year? 0 points 0 points  What month? 0 points 3 points  What time? 0 points 0 points  Count back from 20 0 points 0 points  Months in reverse 0 points 0 points  Repeat phrase 6 points 2 points  Total Score 6 points 5 points    Immunizations Immunization History  Administered Date(s) Administered   Fluad Quad(high Dose 65+) 07/07/2020, 07/10/2022   Fluad Trivalent(High Dose 65+) 06/24/2023   Influenza, High Dose Seasonal PF 06/25/2017, 06/26/2018, 06/08/2019, 06/23/2021   PFIZER Comirnaty(Gray Top)Covid-19 Tri-Sucrose Vaccine 01/06/2021   PFIZER(Purple Top)SARS-COV-2 Vaccination 11/03/2019, 11/24/2019   Pfizer(Comirnaty)Fall Seasonal Vaccine 12 years and older 07/22/2023   Pneumococcal Conjugate-13 09/06/2014   Pneumococcal Polysaccharide-23 09/08/2015, 01/21/2020   Td 11/09/2003   Tdap 07/08/2023    TDAP status: Up to date  Flu Vaccine status: Up to date  Pneumococcal vaccine status: Up to date  Covid-19 vaccine status: Completed vaccines  Qualifies for Shingles Vaccine? Yes   Zostavax completed No   Shingrix Completed?: No.    Education has been provided regarding the importance of this vaccine. Patient has been advised to call insurance company to determine out of pocket expense if they have not yet received this vaccine. Advised may also receive vaccine at local pharmacy or Health Dept. Verbalized acceptance and understanding.  Screening Tests Health Maintenance  Topic Date Due   Zoster Vaccines- Shingrix (1 of 2) Never done   OPHTHALMOLOGY EXAM  01/19/2021   FOOT EXAM  05/01/2022   COVID-19 Vaccine (5 - 2023-24 season) 11/19/2023   HEMOGLOBIN A1C  12/23/2023   MAMMOGRAM  02/21/2024   Diabetic kidney evaluation - eGFR measurement  06/23/2024   Diabetic kidney  evaluation - Urine ACR  06/23/2024   Medicare Annual Wellness (AWV)  07/31/2024   Colonoscopy  02/19/2028   DTaP/Tdap/Td (3 - Td or Tdap) 07/07/2033   Pneumonia Vaccine 78+ Years old  Completed   INFLUENZA VACCINE  Completed   DEXA SCAN  Completed   Hepatitis C Screening  Completed   HPV VACCINES  Aged Out  Health Maintenance  Health Maintenance Due  Topic Date Due   Zoster Vaccines- Shingrix (1 of 2) Never done   OPHTHALMOLOGY EXAM  01/19/2021   FOOT EXAM  05/01/2022    Colorectal cancer screening: Type of screening: Colonoscopy. Completed 02/18/18. Repeat every 10 years  Mammogram status: Completed 02/20/22. Repeat every year  Bone Density status: Completed 01/25/20. Results reflect: Bone density results: OSTEOPENIA. Repeat every   years.  Lung Cancer Screening: (Low Dose CT Chest recommended if Age 22-80 years, 20 pack-year currently smoking OR have quit w/in 15years.) does qualify.   Lung Cancer Screening Referral: Deferred  Additional Screening:  Hepatitis C Screening: does qualify; Completed 12/03/20  Vision Screening: Recommended annual ophthalmology exams for early detection of glaucoma and other disorders of the eye. Is the patient up to date with their annual eye exam?  Yes  Who is the provider or what is the name of the office in which the patient attends annual eye exams? [facility] If pt is not established with a provider, would they like to be referred to a provider to establish care? No .   Dental Screening: Recommended annual dental exams for proper oral hygiene    Community Resource Referral / Chronic Care Management:  CRR required this visit?  No   CCM required this visit?  No     Plan:     I have personally reviewed and noted the following in the patient's chart:   Medical and social history Use of alcohol, tobacco or illicit drugs  Current medications and supplements including opioid prescriptions. Patient is not currently taking opioid  prescriptions. Functional ability and status Nutritional status Physical activity Advanced directives List of other physicians Hospitalizations, surgeries, and ER visits in previous 12 months Vitals Screenings to include cognitive, depression, and falls Referrals and appointments  In addition, I have reviewed and discussed with patient certain preventive protocols, quality metrics, and best practice recommendations. A written personalized care plan for preventive services as well as general preventive health recommendations were provided to patient.     Tillie Rung, LPN   [id number]   After Visit Summary: (MyChart) Due to this being a telephonic visit, the after visit summary with patients personalized plan was offered to patient via MyChart   Nurse Notes: None

## 2023-08-07 ENCOUNTER — Other Ambulatory Visit (HOSPITAL_BASED_OUTPATIENT_CLINIC_OR_DEPARTMENT_OTHER): Payer: Self-pay

## 2023-08-09 ENCOUNTER — Other Ambulatory Visit: Payer: Self-pay | Admitting: Cardiovascular Disease

## 2023-08-09 DIAGNOSIS — E785 Hyperlipidemia, unspecified: Secondary | ICD-10-CM

## 2023-08-13 ENCOUNTER — Other Ambulatory Visit: Payer: Self-pay | Admitting: Family Medicine

## 2023-08-13 DIAGNOSIS — R051 Acute cough: Secondary | ICD-10-CM

## 2023-08-15 ENCOUNTER — Other Ambulatory Visit: Payer: Self-pay | Admitting: Family Medicine

## 2023-08-15 DIAGNOSIS — R634 Abnormal weight loss: Secondary | ICD-10-CM

## 2023-08-16 ENCOUNTER — Telehealth: Payer: Self-pay | Admitting: Internal Medicine

## 2023-08-16 ENCOUNTER — Other Ambulatory Visit: Payer: Self-pay

## 2023-08-16 ENCOUNTER — Other Ambulatory Visit (HOSPITAL_BASED_OUTPATIENT_CLINIC_OR_DEPARTMENT_OTHER): Payer: Self-pay

## 2023-08-16 DIAGNOSIS — K921 Melena: Secondary | ICD-10-CM

## 2023-08-16 MED ORDER — MIRTAZAPINE 7.5 MG PO TABS
7.5000 mg | ORAL_TABLET | Freq: Every day | ORAL | 2 refills | Status: DC
  Filled 2023-08-16 – 2023-08-23 (×2): qty 30, 30d supply, fill #0

## 2023-08-16 NOTE — Telephone Encounter (Signed)
Left message for patient to call back  

## 2023-08-16 NOTE — Telephone Encounter (Signed)
Please have her do a CBC - dx   If she gets faint or light-headed or sees large amounts of red blood needs to go to ED  Encounter Diagnosis  Name Primary?   Black stool Yes

## 2023-08-16 NOTE — Telephone Encounter (Signed)
Order placed. Left message for patient to call back

## 2023-08-16 NOTE — Telephone Encounter (Signed)
Inbound call from patient relative requesting a call back to the patient. States for the last 2 day patient has been having dark stool and abdominal pain. Please advise, thank you.

## 2023-08-16 NOTE — Telephone Encounter (Signed)
Spoke with patient & she has been having dark stools for 1 week. Denies any abdominal pain. Stools vary from loose to formed, at least 1-2/day. She takes pantoprazole daily. No recent pepto bismol use. She says stress could be contributing to her symptoms d/t the passing of her daughter Christine Meyer in October. Last seen for OV 06/2022 with Dr. Leone Payor. She's aware I will call her back with any recommendations he may have once he's reviewed.

## 2023-08-21 ENCOUNTER — Other Ambulatory Visit: Payer: Self-pay

## 2023-08-21 NOTE — Telephone Encounter (Signed)
Patient relative called and stated that was hasn't heard anything back per the information below. Patient relative stated that she would like the nurse to return her call. Please advise.

## 2023-08-21 NOTE — Telephone Encounter (Signed)
Pt relative Darcella Cheshire  made aware of Dr. Leone Payor recommendations. Location to lab provided. [place] verbalized understanding with all questions answered.

## 2023-08-23 ENCOUNTER — Other Ambulatory Visit (HOSPITAL_BASED_OUTPATIENT_CLINIC_OR_DEPARTMENT_OTHER): Payer: Self-pay

## 2023-08-26 ENCOUNTER — Other Ambulatory Visit (HOSPITAL_BASED_OUTPATIENT_CLINIC_OR_DEPARTMENT_OTHER): Payer: Self-pay

## 2023-08-26 ENCOUNTER — Other Ambulatory Visit (HOSPITAL_COMMUNITY): Payer: Self-pay

## 2023-08-26 ENCOUNTER — Other Ambulatory Visit: Payer: Self-pay | Admitting: Family Medicine

## 2023-08-26 MED ORDER — INSULIN SYRINGE 29G X 1" 0.3 ML MISC
1 refills | Status: DC
  Filled 2023-08-26: qty 100, fill #0

## 2023-08-30 ENCOUNTER — Other Ambulatory Visit (INDEPENDENT_AMBULATORY_CARE_PROVIDER_SITE_OTHER): Payer: 59

## 2023-08-30 DIAGNOSIS — K921 Melena: Secondary | ICD-10-CM

## 2023-08-30 LAB — CBC
HCT: 33.6 % — ABNORMAL LOW (ref 36.0–46.0)
Hemoglobin: 11.2 g/dL — ABNORMAL LOW (ref 12.0–15.0)
MCHC: 33.4 g/dL (ref 30.0–36.0)
MCV: 90 fL (ref 78.0–100.0)
Platelets: 211 10*3/uL (ref 150.0–400.0)
RBC: 3.73 Mil/uL — ABNORMAL LOW (ref 3.87–5.11)
RDW: 16 % — ABNORMAL HIGH (ref 11.5–15.5)
WBC: 4.5 10*3/uL (ref 4.0–10.5)

## 2023-09-03 ENCOUNTER — Other Ambulatory Visit (HOSPITAL_BASED_OUTPATIENT_CLINIC_OR_DEPARTMENT_OTHER): Payer: Self-pay

## 2023-09-05 ENCOUNTER — Other Ambulatory Visit: Payer: Self-pay

## 2023-09-05 ENCOUNTER — Other Ambulatory Visit (HOSPITAL_BASED_OUTPATIENT_CLINIC_OR_DEPARTMENT_OTHER): Payer: Self-pay

## 2023-09-05 MED ORDER — CYANOCOBALAMIN 1000 MCG/ML IJ SOLN: 1000.0000 ug | INTRAMUSCULAR | 0 refills | Status: DC

## 2023-09-09 ENCOUNTER — Other Ambulatory Visit (HOSPITAL_BASED_OUTPATIENT_CLINIC_OR_DEPARTMENT_OTHER): Payer: Self-pay

## 2023-09-12 ENCOUNTER — Other Ambulatory Visit (HOSPITAL_BASED_OUTPATIENT_CLINIC_OR_DEPARTMENT_OTHER): Payer: Self-pay

## 2023-09-12 ENCOUNTER — Other Ambulatory Visit: Payer: Self-pay | Admitting: Family Medicine

## 2023-09-18 ENCOUNTER — Telehealth: Payer: Self-pay | Admitting: Pulmonary Disease

## 2023-09-18 DIAGNOSIS — J84112 Idiopathic pulmonary fibrosis: Secondary | ICD-10-CM

## 2023-09-29 ENCOUNTER — Other Ambulatory Visit: Payer: Self-pay | Admitting: Family Medicine

## 2023-09-29 DIAGNOSIS — I10 Essential (primary) hypertension: Secondary | ICD-10-CM

## 2023-09-30 ENCOUNTER — Other Ambulatory Visit: Payer: Self-pay | Admitting: Family Medicine

## 2023-09-30 DIAGNOSIS — I10 Essential (primary) hypertension: Secondary | ICD-10-CM

## 2023-10-07 ENCOUNTER — Other Ambulatory Visit (HOSPITAL_BASED_OUTPATIENT_CLINIC_OR_DEPARTMENT_OTHER): Payer: Self-pay

## 2023-10-07 ENCOUNTER — Encounter: Payer: Self-pay | Admitting: Family Medicine

## 2023-10-07 ENCOUNTER — Ambulatory Visit (INDEPENDENT_AMBULATORY_CARE_PROVIDER_SITE_OTHER): Payer: 59 | Admitting: Family Medicine

## 2023-10-07 VITALS — BP 118/60 | HR 71 | Temp 97.7°F | Resp 16 | Ht 61.0 in | Wt 98.2 lb

## 2023-10-07 DIAGNOSIS — R634 Abnormal weight loss: Secondary | ICD-10-CM

## 2023-10-07 DIAGNOSIS — E1165 Type 2 diabetes mellitus with hyperglycemia: Secondary | ICD-10-CM

## 2023-10-07 DIAGNOSIS — E785 Hyperlipidemia, unspecified: Secondary | ICD-10-CM | POA: Diagnosis not present

## 2023-10-07 DIAGNOSIS — E538 Deficiency of other specified B group vitamins: Secondary | ICD-10-CM | POA: Diagnosis not present

## 2023-10-07 DIAGNOSIS — E1169 Type 2 diabetes mellitus with other specified complication: Secondary | ICD-10-CM | POA: Diagnosis not present

## 2023-10-07 DIAGNOSIS — I10 Essential (primary) hypertension: Secondary | ICD-10-CM | POA: Diagnosis not present

## 2023-10-07 LAB — HEMOGLOBIN A1C: Hgb A1c MFr Bld: 6 % (ref 4.6–6.5)

## 2023-10-07 LAB — LIPID PANEL
Cholesterol: 169 mg/dL (ref 0–200)
HDL: 68.8 mg/dL (ref >39.00)
LDL Cholesterol: 84 mg/dL (ref 0–99)
NonHDL: 100.64
Total CHOL/HDL Ratio: 2
Triglycerides: 84 mg/dL (ref 0.0–149.0)
VLDL: 16.8 mg/dL (ref 0.0–40.0)

## 2023-10-07 LAB — CBC WITH DIFFERENTIAL/PLATELET
Basophils Absolute: 0 10*3/uL (ref 0.0–0.1)
Basophils Relative: 0.4 % (ref 0.0–3.0)
Eosinophils Absolute: 0.2 10*3/uL (ref 0.0–0.7)
Eosinophils Relative: 4.9 % (ref 0.0–5.0)
HCT: 35.8 % — ABNORMAL LOW (ref 36.0–46.0)
Hemoglobin: 11.1 g/dL — ABNORMAL LOW (ref 12.0–15.0)
Lymphocytes Relative: 30.6 % (ref 12.0–46.0)
Lymphs Abs: 1.3 10*3/uL (ref 0.7–4.0)
MCHC: 30.9 g/dL (ref 30.0–36.0)
MCV: 96.3 fL (ref 78.0–100.0)
Monocytes Absolute: 0.4 10*3/uL (ref 0.1–1.0)
Monocytes Relative: 9.2 % (ref 3.0–12.0)
Neutro Abs: 2.3 10*3/uL (ref 1.4–7.7)
Neutrophils Relative %: 54.9 % (ref 43.0–77.0)
Platelets: 287 10*3/uL (ref 150.0–400.0)
RBC: 3.72 Mil/uL — ABNORMAL LOW (ref 3.87–5.11)
RDW: 17.1 % — ABNORMAL HIGH (ref 11.5–15.5)
WBC: 4.2 10*3/uL (ref 4.0–10.5)

## 2023-10-07 LAB — COMPREHENSIVE METABOLIC PANEL
ALT: 15 U/L (ref 0–35)
AST: 18 U/L (ref 0–37)
Albumin: 4.3 g/dL (ref 3.5–5.2)
Alkaline Phosphatase: 71 U/L (ref 39–117)
BUN: 12 mg/dL (ref 6–23)
CO2: 23 meq/L (ref 19–32)
Calcium: 9.3 mg/dL (ref 8.4–10.5)
Chloride: 103 meq/L (ref 96–112)
Creatinine, Ser: 1.11 mg/dL (ref 0.40–1.20)
GFR: 48.95 mL/min — ABNORMAL LOW (ref >60.00)
Glucose, Bld: 55 mg/dL — ABNORMAL LOW (ref 70–99)
Potassium: 4.3 meq/L (ref 3.5–5.1)
Sodium: 141 meq/L (ref 135–145)
Total Bilirubin: 0.5 mg/dL (ref 0.2–1.2)
Total Protein: 7.1 g/dL (ref 6.0–8.3)

## 2023-10-07 LAB — VITAMIN D 25 HYDROXY (VIT D DEFICIENCY, FRACTURES): VITD: 62.88 ng/mL (ref 30.00–100.00)

## 2023-10-07 LAB — VITAMIN B12: Vitamin B-12: 978 pg/mL — ABNORMAL HIGH (ref 211–911)

## 2023-10-07 MED ORDER — MIRTAZAPINE 7.5 MG PO TABS
7.5000 mg | ORAL_TABLET | Freq: Every day | ORAL | 1 refills | Status: AC
  Filled 2023-10-07: qty 90, 90d supply, fill #0
  Filled 2024-04-13: qty 90, 90d supply, fill #1

## 2023-10-07 MED ORDER — INSULIN SYRINGE 29G X 1/2" 0.5 ML MISC
1.0000 | 1 refills | Status: AC
  Filled 2023-10-07: qty 100, 100d supply, fill #0

## 2023-10-07 MED ORDER — CYANOCOBALAMIN 1000 MCG/ML IJ SOLN
1000.0000 ug | INTRAMUSCULAR | 3 refills | Status: AC
  Filled 2023-10-07: qty 3, 90d supply, fill #0

## 2023-10-07 NOTE — Assessment & Plan Note (Signed)
Pt doing well drinking boost --- gained 3 lbs  Con't with boost/ ensure and eating well balanced meals

## 2023-10-07 NOTE — Assessment & Plan Note (Signed)
Encourage heart healthy diet such as MIND or DASH diet, increase exercise, avoid trans fats, simple carbohydrates and processed foods, consider a krill or fish or flaxseed oil cap daily.

## 2023-10-07 NOTE — Assessment & Plan Note (Signed)
Well controlled, no changes to meds. Encouraged heart healthy diet such as the DASH diet and exercise as tolerated.

## 2023-10-07 NOTE — Progress Notes (Signed)
Established Patient Office Visit  Subjective  The patient, with a history of gallbladder removal, presented with ongoing abdominal discomfort. She reported a sensation of 'something going on,' despite a recent MRI showing no abnormalities in the biliary passage. The patient was concerned about potential issues with her liver, given the localized pain. She denied any alcohol consumption or use of over-the-counter medications, including Tylenol, which could potentially affect liver function.  The patient also reported sudden onset of pain in the back of her leg, associated with varicose veins. The pain was described as significant enough to affect her leg strength. She denied any associated swelling or other symptoms.  The patient had been scheduled to see a nurse practitioner at a gastroenterology clinic but chose not to attend the appointment, preferring to wait for the outcome of this consultation. She expressed a preference for seeing a physician rather than a nurse practitioner for her care.  The patient's liver enzymes had previously been elevated, but recent tests showed improvement. The cause of the initial elevation remains unknown. The patient's pancreas enzyme was reported as normal. Patient ID: Christine Meyer, female    DOB: 29-Jul-1951  Age: 73 y.o. MRN: [id number]  Chief Complaint  Patient presents with   Hypertension   Hyperlipidemia   Follow-up      Patient Active Problem List   Diagnosis Date Noted   B12 deficiency 10/07/2023   Early satiety 10/09/2022   IPF (idiopathic pulmonary fibrosis) (HCC) 07/11/2022   Preventative health care 01/23/2022   Hyperlipidemia associated with type 2 diabetes mellitus (HCC) 01/23/2022   Claudication in peripheral vascular disease (HCC) 12/08/2020   Mesenteric ischemia (HCC) 08/16/2020   Bronchitis 07/10/2020   COPD with acute exacerbation (HCC) 06/30/2020   Rheumatoid arthritis involving multiple sites (HCC) 01/21/2020   Coagulation  disorder (HCC) 01/21/2020   Uncontrolled type 2 diabetes mellitus with hyperglycemia (HCC) 01/21/2020   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   Hyperlipidemia LDL goal <70 06/28/2018   Degenerative disc disease, cervical 03/27/2018   Gastric polyps - hyperplastic 02/23/2018   Antiplatelet or antithrombotic long-term use - clopidogrel - CAD/PAD 02/13/2018   Osteopenia 01/26/2018   Dyspepsia 01/26/2018   Rotator cuff tear, left 01/24/2018   COPD (chronic obstructive pulmonary disease) (HCC) 07/26/2017   Weight loss 04/22/2017   Cough 04/22/2017   Current every day smoker 04/22/2017   Bilateral carotid artery disease (HCC) 07/04/2016   Claudication (HCC) 11/22/2014   Peripheral arterial disease (HCC) 10/22/2014   Renal artery stenosis, native, bilateral (HCC) 10/22/2014   Centrilobular emphysema (HCC) 10/19/2014   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   Pain in joint, lower leg 08/24/2014   Dyslipidemia 03/12/2011   Coronary atherosclerosis 09/19/2010   GERD 02/23/2010   Smoker 10/14/2009   EDEMA- LOCALIZED 04/06/2008   CAROTID BRUITS, BILATERAL 04/06/2008   Disorder resulting from impaired renal function 10/09/2007   DM (diabetes mellitus) type II, controlled, with peripheral vascular disorder (HCC) 07/07/2007   Normocytic anemia 07/07/2007   Essential hypertension 07/07/2007   HX, PERSONAL, MALIGNANCY, UTERUS NEC 07/07/2007   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   CAD (coronary artery disease)    Stent RCA 1999   Chronic kidney disease    COPD (chronic obstructive pulmonary disease) (HCC)    Diabetes mellitus, type 2 (HCC)    Diet controlled   Gastric polyps - hyperplastic 02/23/2018   Hyperlipidemia  Hypertension    OSA (obstructive sleep apnea) [name]    PVD (peripheral vascular disease) (HCC)    Carotid stenosis, renal artery stenosis    Sleep apnea    dx'd but doesn't use mask    Uterine cancer (HCC) ?0981'X   Past Surgical History:  Procedure Laterality Date   ABDOMINAL ANGIOGRAM  11/22/2014   Procedure: ABDOMINAL ANGIOGRAM;  Surgeon: Runell Gess, MD;  Location: [facility] CATH LAB;  Service: Cardiovascular;;   ABDOMINAL AORTOGRAM W/LOWER EXTREMITY N/A 10/03/2020   Procedure: ABDOMINAL AORTOGRAM W/LOWER EXTREMITY;  Surgeon: Runell Gess, MD;  Location: [facility] INVASIVE CV LAB;  Service: Cardiovascular;  Laterality: N/A;   COLONOSCOPY  2000   negative   CORONARY ANGIOPLASTY WITH STENT PLACEMENT  1999   RCA; Dr. Elsie Lincoln G4 P4   ESOPHAGOGASTRODUODENOSCOPY     ILIAC ARTERY STENT Right 11/22/2014   LOWER EXTREMITY ANGIOGRAM N/A 11/22/2014   Procedure: LOWER EXTREMITY ANGIOGRAM;  Surgeon: Runell Gess, MD; L-SFA 100%, 3v runoff, R-pCIA 95>>0% w/ 8 mm x 18 mm long  Balloon expandable stent; R-dCIA 67>>0% w/ 8 mm x 4 cm long Cordis Smart Nitinol self  expanding stent    LOWER EXTREMITY ANGIOGRAPHY Bilateral 12/08/2020   Procedure: Lower Extremity Angiography;  Surgeon: Runell Gess, MD;  Location: [facility] INVASIVE CV LAB;  Service: Cardiovascular;  Laterality: Bilateral;   PERIPHERAL VASCULAR ATHERECTOMY Left 12/08/2020   Procedure: PERIPHERAL VASCULAR ATHERECTOMY;  Surgeon: Runell Gess, MD;  Location: [facility] INVASIVE CV LAB;  Service: Cardiovascular;  Laterality: Left;  common iliac   PERIPHERAL VASCULAR INTERVENTION Left 12/08/2020   Procedure: PERIPHERAL VASCULAR INTERVENTION;  Surgeon: Runell Gess, MD;  Location: [facility] INVASIVE CV LAB;  Service: Cardiovascular;  Laterality: Left;  common iliac   RENAL ARTERY STENT Right May 2007; 08/2006   ; restenosis   TOTAL ABDOMINAL HYSTERECTOMY  ?9147'W    for uterine CA   VISCERAL ARTERY INTERVENTION N/A 10/03/2020   Procedure: VISCERAL ARTERY INTERVENTION;  Surgeon: Runell Gess, MD;  Location: [facility] INVASIVE CV LAB;  Service: Cardiovascular;  Laterality: N/A;  SMA   Social History    Tobacco Use   Smoking status: Every Day    Current packs/day: 0.25    Average packs/day: 0.3 packs/day for 51.0 years (12.8 ttl pk-yrs)    Types: Cigarettes    Passive exposure: Current   Smokeless tobacco: Never   Tobacco comments:    4 cigarettes per day/lt 10/31/22  Vaping Use   Vaping status: Never Used  Substance Use Topics   Alcohol use: No    Alcohol/week: 0.0 standard drinks of alcohol   Drug use: No   Social History   Socioeconomic History   Marital status: Single    Spouse name: Not on file   Number of children: 4   Years of education: Not on file   Highest education level: Not on file  Occupational History   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    Employer: [employer]  Tobacco Use   Smoking status: Every Day    Current packs/day: 0.25    Average packs/day: 0.3 packs/day for 51.0 years (12.8 ttl pk-yrs)    Types: Cigarettes    Passive exposure: Current   Smokeless tobacco: Never   Tobacco comments:    4 cigarettes per day/lt 10/31/22  Vaping Use   Vaping status: Never Used  Substance and Sexual Activity   Alcohol use: No    Alcohol/week: 0.0  standard drinks of alcohol   Drug use: No   Sexual activity: Not Currently  Other Topics Concern   Not on file  Social History Narrative      Daughter [name] is CMA Adult nurse primary care [facility]   Retired Information systems manager   Smoker, no EtOH/drugs   Social Drivers of Corporate investment banker Strain: Low Risk  (08/01/2023)   Overall Financial Resource Strain (CARDIA)    Difficulty of Paying Living Expenses: Not hard at all  Food Insecurity: No Food Insecurity (08/01/2023)   Hunger Vital Sign    Worried About Running Out of Food in the Last Year: Never true    Ran Out of Food in the Last Year: Never true  Transportation Needs: No Transportation Needs (08/01/2023)   PRAPARE - Administrator, Civil Service (Medical): No    Lack of Transportation  (Non-Medical): No  Physical Activity: Inactive (08/01/2023)   Exercise Vital Sign    Days of Exercise per Week: 0 days    Minutes of Exercise per Session: 0 min  Stress: No Stress Concern Present (08/01/2023)   [employer] of Occupational Health - Occupational Stress Questionnaire    Feeling of Stress : Not at all  Social Connections: Moderately Integrated (08/01/2023)   Social Connection and Isolation Panel [NHANES]    Frequency of Communication with Friends and Family: More than three times a week    Frequency of Social Gatherings with Friends and Family: More than three times a week    Attends Religious Services: More than 4 times per year    Active Member of [employer] or Organizations: Yes    Attends Banker Meetings: More than 4 times per year    Marital Status: Never married  Intimate Partner Violence: Not At Risk (08/01/2023)   Humiliation, Afraid, Rape, and Kick questionnaire    Fear of Current or Ex-Partner: No    Emotionally Abused: No    Physically Abused: No    Sexually Abused: No   Family Status  Relation Name Status   Mother  Deceased   Father  Deceased   Sister  (Not Specified)   Brother  Deceased   Brother  (Not Specified)   MGM  (Not Specified)   Daughter benita Ledesma Deceased at age 52       MI/ stroke   Daughter LUCY Alive   Mat Aunt  (Not Specified)   Oceanographer  (Not Specified)  No partnership data on file   Family History  Problem Relation Age of Onset   Heart failure Mother        after hip fx.    Hypertension Mother    Diabetes Mother    Deep vein thrombosis Mother    Hypertension Father    Diabetes Sister        insulin dependent   Hypertension Brother    Emphysema Brother    Diabetes Brother        insulin dependent   Diabetes Maternal Grandmother        non-insulin dependent   Stroke Daughter    Heart attack Daughter    Breast cancer Daughter    Brain cancer Daughter    Breast cancer Daughter    Diabetes Maternal Aunt         non-insulent dependent   Heart attack Paternal Aunt   ++++ ROS    Objective:     BP 118/60 (BP Location: Left Arm, Patient Position: Sitting, Cuff Size:  Normal)   Pulse 71   Temp 97.7 F (36.5 C) (Oral)   Resp 16   Ht 5\' 1"  (1.549 m)   Wt 98 lb 3.2 oz (44.5 kg)   SpO2 98%   BMI 18.55 kg/m  BP Readings from Last 3 Encounters:  10/07/23 118/60  07/29/23 (!) 150/58  07/08/23 120/60   Wt Readings from Last 3 Encounters:  10/07/23 98 lb 3.2 oz (44.5 kg)  08/01/23 95 lb (43.1 kg)  07/29/23 95 lb 3.2 oz (43.2 kg)   SpO2 Readings from Last 3 Encounters:  10/07/23 98%  07/08/23 96%  06/24/23 98%      Physical Exam   No results found for any visits on 10/07/23.  Last CBC Lab Results  Component Value Date   WBC 4.5 08/30/2023   HGB 11.2 (L) 08/30/2023   HCT 33.6 (L) 08/30/2023   MCV 90.0 08/30/2023   MCH 29.4 08/04/2021   RDW 16.0 (H) 08/30/2023   PLT 211.0 08/30/2023   Last metabolic panel Lab Results  Component Value Date   GLUCOSE 102 (H) 06/24/2023   NA 142 06/24/2023   K 5.0 06/24/2023   CL 106 06/24/2023   CO2 26 06/24/2023   BUN 11 06/24/2023   CREATININE 1.06 06/24/2023   GFR 51.84 (L) 06/24/2023   CALCIUM 9.7 06/24/2023   PROT 6.9 06/24/2023   ALBUMIN 4.1 06/24/2023   LABGLOB 2.5 05/16/2021   AGRATIO 1.7 05/16/2021   BILITOT 0.4 06/24/2023   ALKPHOS 68 06/24/2023   AST 24 06/24/2023   ALT 17 06/24/2023   ANIONGAP 12 08/04/2021   Last lipids Lab Results  Component Value Date   CHOL 149 06/24/2023   HDL 50.90 06/24/2023   LDLCALC 81 06/24/2023   TRIG 88.0 06/24/2023   CHOLHDL 3 06/24/2023   Last hemoglobin A1c Lab Results  Component Value Date   HGBA1C 6.3 06/24/2023   Last thyroid functions Lab Results  Component Value Date   TSH 1.82 06/24/2023   T4TOTAL 6.5 06/24/2023   Last vitamin D Lab Results  Component Value Date   VD25OH 34 01/24/2018   Last vitamin B12 and Folate Lab Results  Component Value Date    VITAMINB12 711 06/26/2018   FOLATE >24.0 02/28/2018      The 10-year ASCVD risk score (Arnett DK, et al., 2019) is: 38.7%    Assessment & Plan:   Problem List Items Addressed This Visit       Unprioritized   Uncontrolled type 2 diabetes mellitus with hyperglycemia (HCC)   Relevant Orders   CBC with Differential/Platelet   Comprehensive metabolic panel   Lipid panel   Hemoglobin A1c   Hyperlipidemia LDL goal <70   Relevant Orders   CBC with Differential/Platelet   Comprehensive metabolic panel   Lipid panel   Weight loss   Pt doing well drinking boost --- gained 3 lbs  Con't with boost/ ensure and eating well balanced meals      Relevant Medications   mirtazapine (REMERON) 7.5 MG tablet   Other Relevant Orders   Vitamin B12   VITAMIN D 25 Hydroxy (Vit-D Deficiency, Fractures)   Hyperlipidemia associated with type 2 diabetes mellitus (HCC)   Encourage heart healthy diet such as MIND or DASH diet, increase exercise, avoid trans fats, simple carbohydrates and processed foods, consider a krill or fish or flaxseed oil cap daily.        Essential hypertension   Well controlled, no changes to meds. Encouraged heart healthy diet such  as the DASH diet and exercise as tolerated.        Relevant Orders   CBC with Differential/Platelet   Comprehensive metabolic panel   Lipid panel   B12 deficiency - Primary   Relevant Medications   cyanocobalamin (VITAMIN B12) 1000 MCG/ML injection   INSULIN SYRINGE .5CC/29G 29G X 1/2" 0.5 ML MISC   Other Relevant Orders   Vitamin B12  Right Leg Pain and Swelling Acute pain and swelling in the right leg with a history of varicose veins suggest possible deep vein thrombosis (DVT) or other vascular issues. A vascular ultrasound of the lower right leg is necessary to rule out DVT. Evaluate for varicose veins.  Abdominal Pain Persistent abdominal pain post-cholecystectomy, with a normal recent MRI ruling out duct dilatation and retained  stones. Liver enzymes are elevated but improving, with no alcohol or significant medication use affecting liver function. Differential diagnosis includes liver pathology and other gastrointestinal issues. Order liver function tests and refer to a gastroenterologist for further evaluation. Schedule a follow-up appointment with Dr. Sharla Kidney.  Hemangioma An incidental liver hemangioma was found on MRI, typically benign and asymptomatic. Monitor for any changes or symptoms.  Follow-up Schedule a follow-up appointment with Dr. Sharla Kidney. Complete blood work and radiology tests as ordered. Ensure communication with the gastroenterologist for further evaluation.  No follow-ups on file.    Donato Schultz, DO

## 2023-10-08 ENCOUNTER — Other Ambulatory Visit (HOSPITAL_BASED_OUTPATIENT_CLINIC_OR_DEPARTMENT_OTHER): Payer: Self-pay

## 2023-10-10 ENCOUNTER — Encounter: Payer: Self-pay | Admitting: Family Medicine

## 2023-10-16 DIAGNOSIS — M79672 Pain in left foot: Secondary | ICD-10-CM | POA: Diagnosis not present

## 2023-10-16 DIAGNOSIS — M79671 Pain in right foot: Secondary | ICD-10-CM | POA: Diagnosis not present

## 2023-10-16 DIAGNOSIS — M2041 Other hammer toe(s) (acquired), right foot: Secondary | ICD-10-CM | POA: Diagnosis not present

## 2023-10-16 DIAGNOSIS — M2042 Other hammer toe(s) (acquired), left foot: Secondary | ICD-10-CM | POA: Diagnosis not present

## 2023-10-16 DIAGNOSIS — B351 Tinea unguium: Secondary | ICD-10-CM | POA: Diagnosis not present

## 2023-10-26 ENCOUNTER — Other Ambulatory Visit: Payer: Self-pay | Admitting: Pulmonary Disease

## 2023-10-26 DIAGNOSIS — J4489 Other specified chronic obstructive pulmonary disease: Secondary | ICD-10-CM

## 2023-10-31 ENCOUNTER — Encounter: Payer: Self-pay | Admitting: Pulmonary Disease

## 2023-10-31 ENCOUNTER — Ambulatory Visit: Payer: 59 | Admitting: Pulmonary Disease

## 2023-10-31 VITALS — BP 110/64 | HR 72 | Ht 62.0 in | Wt 98.6 lb

## 2023-10-31 DIAGNOSIS — F17210 Nicotine dependence, cigarettes, uncomplicated: Secondary | ICD-10-CM | POA: Diagnosis not present

## 2023-10-31 DIAGNOSIS — J449 Chronic obstructive pulmonary disease, unspecified: Secondary | ICD-10-CM

## 2023-10-31 DIAGNOSIS — Z5181 Encounter for therapeutic drug level monitoring: Secondary | ICD-10-CM

## 2023-10-31 DIAGNOSIS — J84112 Idiopathic pulmonary fibrosis: Secondary | ICD-10-CM | POA: Diagnosis not present

## 2023-10-31 MED ORDER — STIOLTO RESPIMAT 2.5-2.5 MCG/ACT IN AERS: 2.0000 | INHALATION_SPRAY | Freq: Every day | RESPIRATORY_TRACT | 3 refills | Status: DC

## 2023-10-31 NOTE — Progress Notes (Signed)
Christine Meyer    [id number]    [birth year]  Primary Care Physician:Lowne Almeta Monas Grayling Congress, DO  Referring Physician: Zola Button, Grayling Congress, DO [address] [place],  [place] [address]  Chief complaint: Follow-up for COPD, idiopathic pulmonary fibrosis  HPI: Mrs. Christine Meyer was diagnosed with IPF in 2016 after CT scan showing UIP pulmonary fibrosis and centrilobular emphysema.  She has significant smoking history.  Evaluated by rheumatology in 2016 by Dr. Nickola Major for mildly elevated rheumatoid factor with no evidence of connective tissue disease She is started on Esbriet in 2016 She was initially taking it twice a day due to fear of side effects.  Dose increased to full dose, 3 times a day in 2021 and she has tolerated it well.  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  No mold, hot tub, Jacuzzi, no feather pillows or comforter Smoking history: 13-pack-year smoker.  Continues to smoke every day Travel history: No significant travel history Relevant family history: No significant family history of lung disease  Interim history: Discussed the use of AI scribe software for clinical note transcription with the patient, who gave verbal consent to proceed.  Christine Meyer is a 73 year old female with idiopathic pulmonary fibrosis and COPD who presents for follow-up of her pulmonary conditions.  She experiences shortness of breath, particularly during physical activities such as walking, necessitating frequent pauses to catch her breath.  She is currently being treated for idiopathic pulmonary fibrosis with Esbriet, taken three times daily. She has a reduced appetite and lacks a taste for food but manages to eat enough to take her medication. Liver function tests are monitored every six months due to her medication.  For chronic obstructive pulmonary disease, she uses the Stiolto inhaler once daily, typically around 9 or 10 o'clock  in the morning, and mentions using it twice in the morning. She is attempting to reduce smoking and reports not smoking during the day or the previous day.  Her last lung function test was in February of the previous year, and a scan was done in August of the previous year, both of which were stable. She had labs done at her primary care office in January, which were satisfactory.  She mentions having enough medication for the current month and is expecting a delivery from [place], her Recruitment consultant. She also has a rescue medication that she can take every six hours, although she is unsure about its use.    Outpatient Encounter Medications as of 10/31/2023  Medication Sig   ACCU-CHEK SOFTCLIX LANCETS lancets Check blood sugar once daily. Dx:E11.9   albuterol (PROVENTIL HFA) 108 (90 Base) MCG/ACT inhaler Inhale 1-2 puffs into the lungs every 6 (six) hours as needed for wheezing or shortness of breath.   aspirin EC 81 MG tablet Take 1 tablet (81 mg total) by mouth daily.   atorvastatin (LIPITOR) 80 MG tablet TAKE 1 TABLET BY MOUTH ONCE  DAILY   benzonatate (TESSALON) 100 MG capsule TAKE 2 CAPSULES(200 MG) BY MOUTH THREE TIMES DAILY AS NEEDED FOR COUGH   Blood Glucose Monitoring Suppl (ONETOUCH VERIO) w/Device KIT Use daily to check blood sugar.   DX E11.9   Calcium 500-100 MG-UNIT CHEW Chew 1 tablet by mouth daily before breakfast.   Cholecalciferol (VITAMIN D3) 50 MCG (2000 UT) capsule Take 1 capsule (2,000 Units total) by mouth daily.   clopidogrel (PLAVIX) 75 MG tablet TAKE 1 TABLET  BY MOUTH DAILY   cyanocobalamin (VITAMIN B12) 1000 MCG/ML injection Inject 1 mL (1,000 mcg total) into the muscle every 30 (thirty) days.   FEROSUL 325 (65 Fe) MG tablet TAKE 1 TABLET BY MOUTH DAILY   glucose blood (ONETOUCH VERIO) test strip USE AS DIRECTED DAILY   guaiFENesin (MUCINEX) 600 MG 12 hr tablet Take 1 tablet (600 mg total) by mouth 2 (two) times daily.   INSULIN SYRINGE .5CC/29G 29G X 1/2" 0.5 ML  MISC Inject 1 each into the muscle every 30 (thirty) days.   loratadine (CLARITIN) 10 MG tablet Take 1 tablet (10 mg total) by mouth daily.   metoprolol succinate (TOPROL-XL) 25 MG 24 hr tablet TAKE 1 TABLET BY MOUTH DAILY  TAKE WITH OR IMMEDIATELY  FOLLOWING A MEAL   mirtazapine (REMERON) 7.5 MG tablet Take 1 tablet (7.5 mg total) by mouth at bedtime.   Multiple Vitamins-Minerals (A THRU Z SELECT 50+ ADVANCED) TABS TAKE 1 TABLET BY MOUTH DAILY (Patient taking differently: Take 1 tablet by mouth daily.)   olmesartan (BENICAR) 20 MG tablet TAKE 1 TABLET BY MOUTH DAILY   pantoprazole (PROTONIX) 40 MG tablet TAKE 1 TABLET(40 MG) BY MOUTH DAILY BEFORE BREAKFAST   Pirfenidone 801 MG TABS TAKE 1 TABLET (801MG ) BY MOUTH  THREE TIMES DAILY WITH FOOD   spironolactone (ALDACTONE) 25 MG tablet 1/2-1 po every day as needed   STIOLTO RESPIMAT 2.5-2.5 MCG/ACT AERS USE 2 INHALATIONS BY MOUTH DAILY   sucralfate (CARAFATE) 1 g tablet Take 1 tablet (1 g total) by mouth 4 (four) times daily -  with meals and at bedtime.   SYRINGE-NEEDLE, DISP, 3 ML (BD [employer]TE[employer]RA SYRINGE) 25G X 1" 3 ML MISC Use to inject b12 once a month.   No facility-administered encounter medications on file as of 10/31/2023.    Allergies as of 10/31/2023   (No Known Allergies)    Physical Exam: Blood pressure 110/64, pulse 72, height 5\' 2"  (1.575 m), weight 98 lb 9.6 oz (44.7 kg), SpO2 98%. Gen:      No acute distress, frail, elderly HEENT:  EOMI, sclera anicteric Neck:     No masses; no thyromegaly Lungs:    Clear to auscultation bilaterally; normal respiratory effort CV:         Regular rate and rhythm; no murmurs Abd:      + bowel sounds; soft, non-tender; no palpable masses, no distension Ext:    No edema; adequate peripheral perfusion Skin:      Warm and dry; no rash Neuro: alert and oriented x 3 Psych: normal mood and affect   Data Reviewed: Imaging: CT chest 05/09/2018-mild emphysema, slight progression of interstitial lung  disease with UIP pattern CT high-resolution 02/16/2020-mild emphysema, stable UIP pulmonary fibrosis. CT high-resolution 08/15/2020- stable pattern of pulmonary fibrosis, emphysema Chest x-ray 07/10/2022-stable pulmonary fibrosis CT abdomen pelvis 10/19/2022-chronic interstitial lung disease at the base. High resolution CT 04/22/2023-minimally progressive pattern of pulmonary fibrosis in UIP I have reviewed the images personally.  PFTs: 08/19/19 FVC 2.28 [101%], FEV1 1.78 [101%], F/F 78, TLC 3.86 [78%], DLCO 9.31 [49%]  09/08/2020 FVC 1.65 [74%], FEV1 1.05 [61%], F/F 64, TLC 3.64 [74%], DLCO 6.32 (33%) Moderate restriction with severe diffusion defect  10/31/2022 FVC 1.69 [61%], FEV1 1.22 [58%], F/F72, TLC 4.90 [64%], Moderate restriction.  Unable to complete DLCO  Labs: 10/19/2014 ANA, Aldolase, CCP, SCL 70, SSA/SSB, Jo-1 all negative; however ESR 40, RF 17 (both elevated)  Hepatic panel 10/07/2023-stable  Assessment:  Idiopathic Pulmonary Fibrosis (IPF) IPF  with lung scarring, currently well-managed on Esbriet (pirfenidone) 3 times daily. Reports exertional dyspnea but tolerates medication well. Recent labs (January) and previous lung function test (February last year) and scan (August last year) were stable. Medication aims to slow disease progression and preserve lung function.  - Continue Esbriet (pirfenidone) 3 times daily - Order repeat CT scan in August 2025 - Order repeat lung function test in August 2025 - Follow-up in six months  Chronic Obstructive Pulmonary Disease (COPD) COPD managed with Stiolto inhaler once daily. She is compliant and has reduced smoking. Current dose is the maximum and effective. Smoking cessation encouraged but she is not interested in quitting.  Time spent counseling-5 minutes Reassess at return visit  - Continue Stiolto inhaler once daily - Renew prescription for [employer] inhaler - Encourage smoking cessation  General Health Maintenance Liver  function tests monitored every six months due to medication use. - Monitor liver function tests every six months.  Labs in January 2025 are normal.  Follow-up - Schedule follow-up appointment in six months.   Plan/Recommendations: - Community education officer.   - Check labs for monitoring - PFTs and follow-up in 6 months  Chilton Greathouse MD [facility] 10/31/2023, 1:12 PM  CC: Donato Schultz, *

## 2023-10-31 NOTE — Patient Instructions (Signed)
VISIT SUMMARY:  Today, we reviewed your pulmonary conditions, including idiopathic pulmonary fibrosis (IPF) and chronic obstructive pulmonary disease (COPD). We discussed your current medications, recent test results, and your progress with smoking cessation. Your medications are working well, and we have planned for future tests and follow-ups to monitor your condition.  YOUR PLAN:  -IDIOPATHIC PULMONARY FIBROSIS (IPF): Idiopathic Pulmonary Fibrosis is a lung disease that causes scarring of the lungs, making it difficult to breathe. You are currently managing this condition with Esbriet (pirfenidone) taken three times daily. We will continue this medication and plan to repeat your CT scan and lung function test in August 2025 to monitor your condition. Please continue taking your medication as prescribed and follow up in six months.  -CHRONIC OBSTRUCTIVE PULMONARY DISEASE (COPD): Chronic Obstructive Pulmonary Disease is a chronic inflammatory lung disease that obstructs airflow from the lungs. You are managing this condition with the Stiolto inhaler once daily. We will renew your prescription for the Stiolto inhaler and encourage you to continue your efforts to quit smoking. Please continue using your inhaler as prescribed.  -GENERAL HEALTH MAINTENANCE: We will continue to monitor your liver function tests every six months due to your medication use. Your recent labs were satisfactory, and we will keep an eye on your liver health to ensure it remains stable.  INSTRUCTIONS:  Please schedule a follow-up appointment in six months. We will also plan for a repeat CT scan and lung function test in August 2025.

## 2023-11-05 ENCOUNTER — Encounter: Payer: Self-pay | Admitting: Family Medicine

## 2023-11-05 ENCOUNTER — Other Ambulatory Visit: Payer: Self-pay | Admitting: Family Medicine

## 2023-11-05 DIAGNOSIS — I10 Essential (primary) hypertension: Secondary | ICD-10-CM

## 2023-11-05 DIAGNOSIS — H25813 Combined forms of age-related cataract, bilateral: Secondary | ICD-10-CM | POA: Diagnosis not present

## 2023-11-05 DIAGNOSIS — H40033 Anatomical narrow angle, bilateral: Secondary | ICD-10-CM | POA: Diagnosis not present

## 2023-11-05 LAB — HM DIABETES EYE EXAM

## 2023-11-05 NOTE — Telephone Encounter (Signed)
 Noted.

## 2023-11-25 ENCOUNTER — Encounter: Payer: Self-pay | Admitting: Family Medicine

## 2023-11-25 DIAGNOSIS — H25811 Combined forms of age-related cataract, right eye: Secondary | ICD-10-CM | POA: Diagnosis not present

## 2023-11-25 DIAGNOSIS — Z01818 Encounter for other preprocedural examination: Secondary | ICD-10-CM | POA: Diagnosis not present

## 2023-11-25 DIAGNOSIS — H25813 Combined forms of age-related cataract, bilateral: Secondary | ICD-10-CM | POA: Diagnosis not present

## 2023-12-12 DIAGNOSIS — E1136 Type 2 diabetes mellitus with diabetic cataract: Secondary | ICD-10-CM | POA: Diagnosis not present

## 2023-12-12 DIAGNOSIS — E1122 Type 2 diabetes mellitus with diabetic chronic kidney disease: Secondary | ICD-10-CM | POA: Diagnosis not present

## 2023-12-12 DIAGNOSIS — N189 Chronic kidney disease, unspecified: Secondary | ICD-10-CM | POA: Diagnosis not present

## 2023-12-12 DIAGNOSIS — H25811 Combined forms of age-related cataract, right eye: Secondary | ICD-10-CM | POA: Diagnosis not present

## 2023-12-23 ENCOUNTER — Encounter: Payer: Self-pay | Admitting: Family Medicine

## 2024-01-02 ENCOUNTER — Other Ambulatory Visit: Payer: Self-pay | Admitting: Family Medicine

## 2024-01-02 DIAGNOSIS — E1122 Type 2 diabetes mellitus with diabetic chronic kidney disease: Secondary | ICD-10-CM | POA: Diagnosis not present

## 2024-01-02 DIAGNOSIS — N189 Chronic kidney disease, unspecified: Secondary | ICD-10-CM | POA: Diagnosis not present

## 2024-01-02 DIAGNOSIS — H25812 Combined forms of age-related cataract, left eye: Secondary | ICD-10-CM | POA: Diagnosis not present

## 2024-01-02 DIAGNOSIS — E1136 Type 2 diabetes mellitus with diabetic cataract: Secondary | ICD-10-CM | POA: Diagnosis not present

## 2024-01-06 ENCOUNTER — Ambulatory Visit (INDEPENDENT_AMBULATORY_CARE_PROVIDER_SITE_OTHER): Payer: 59 | Admitting: Family Medicine

## 2024-01-06 ENCOUNTER — Other Ambulatory Visit (HOSPITAL_BASED_OUTPATIENT_CLINIC_OR_DEPARTMENT_OTHER): Payer: Self-pay

## 2024-01-06 ENCOUNTER — Encounter: Payer: Self-pay | Admitting: Family Medicine

## 2024-01-06 VITALS — BP 148/60 | HR 66 | Temp 97.6°F | Resp 18 | Ht 62.0 in | Wt 98.8 lb

## 2024-01-06 DIAGNOSIS — E1165 Type 2 diabetes mellitus with hyperglycemia: Secondary | ICD-10-CM

## 2024-01-06 DIAGNOSIS — R051 Acute cough: Secondary | ICD-10-CM | POA: Diagnosis not present

## 2024-01-06 DIAGNOSIS — I10 Essential (primary) hypertension: Secondary | ICD-10-CM | POA: Diagnosis not present

## 2024-01-06 DIAGNOSIS — E785 Hyperlipidemia, unspecified: Secondary | ICD-10-CM | POA: Diagnosis not present

## 2024-01-06 DIAGNOSIS — E538 Deficiency of other specified B group vitamins: Secondary | ICD-10-CM

## 2024-01-06 DIAGNOSIS — J40 Bronchitis, not specified as acute or chronic: Secondary | ICD-10-CM

## 2024-01-06 DIAGNOSIS — H6121 Impacted cerumen, right ear: Secondary | ICD-10-CM

## 2024-01-06 DIAGNOSIS — J9589 Other postprocedural complications and disorders of respiratory system, not elsewhere classified: Secondary | ICD-10-CM

## 2024-01-06 DIAGNOSIS — R059 Cough, unspecified: Secondary | ICD-10-CM | POA: Diagnosis not present

## 2024-01-06 LAB — CBC WITH DIFFERENTIAL/PLATELET
Basophils Absolute: 0 10*3/uL (ref 0.0–0.1)
Basophils Relative: 0.4 % (ref 0.0–3.0)
Eosinophils Absolute: 0.2 10*3/uL (ref 0.0–0.7)
Eosinophils Relative: 4.2 % (ref 0.0–5.0)
HCT: 35 % — ABNORMAL LOW (ref 36.0–46.0)
Hemoglobin: 11.4 g/dL — ABNORMAL LOW (ref 12.0–15.0)
Lymphocytes Relative: 39.1 % (ref 12.0–46.0)
Lymphs Abs: 1.6 10*3/uL (ref 0.7–4.0)
MCHC: 32.7 g/dL (ref 30.0–36.0)
MCV: 90.6 fl (ref 78.0–100.0)
Monocytes Absolute: 0.4 10*3/uL (ref 0.1–1.0)
Monocytes Relative: 8.4 % (ref 3.0–12.0)
Neutro Abs: 2 10*3/uL (ref 1.4–7.7)
Neutrophils Relative %: 47.9 % (ref 43.0–77.0)
Platelets: 233 10*3/uL (ref 150.0–400.0)
RBC: 3.86 Mil/uL — ABNORMAL LOW (ref 3.87–5.11)
RDW: 14.9 % (ref 11.5–15.5)
WBC: 4.2 10*3/uL (ref 4.0–10.5)

## 2024-01-06 LAB — COMPREHENSIVE METABOLIC PANEL WITH GFR
ALT: 14 U/L (ref 0–35)
AST: 15 U/L (ref 0–37)
Albumin: 4 g/dL (ref 3.5–5.2)
Alkaline Phosphatase: 66 U/L (ref 39–117)
BUN: 12 mg/dL (ref 6–23)
CO2: 28 meq/L (ref 19–32)
Calcium: 9 mg/dL (ref 8.4–10.5)
Chloride: 104 meq/L (ref 96–112)
Creatinine, Ser: 1.16 mg/dL (ref 0.40–1.20)
GFR: 46.35 mL/min — ABNORMAL LOW (ref >60.00)
Glucose, Bld: 71 mg/dL (ref 70–99)
Potassium: 3.9 meq/L (ref 3.5–5.1)
Sodium: 140 meq/L (ref 135–145)
Total Bilirubin: 0.4 mg/dL (ref 0.2–1.2)
Total Protein: 6.8 g/dL (ref 6.0–8.3)

## 2024-01-06 LAB — LIPID PANEL
Cholesterol: 142 mg/dL (ref 0–200)
HDL: 54.1 mg/dL (ref >39.00)
LDL Cholesterol: 72 mg/dL (ref 0–99)
NonHDL: 88.36
Total CHOL/HDL Ratio: 3
Triglycerides: 80 mg/dL (ref 0.0–149.0)
VLDL: 16 mg/dL (ref 0.0–40.0)

## 2024-01-06 LAB — VITAMIN B12: Vitamin B-12: 871 pg/mL (ref 211–911)

## 2024-01-06 LAB — HEMOGLOBIN A1C: Hgb A1c MFr Bld: 6.2 % (ref 4.6–6.5)

## 2024-01-06 LAB — TSH: TSH: 3.21 u[IU]/mL (ref 0.35–5.50)

## 2024-01-06 MED ORDER — LORATADINE 10 MG PO TABS
10.0000 mg | ORAL_TABLET | Freq: Every day | ORAL | 11 refills | Status: DC
  Filled 2024-01-06: qty 30, 30d supply, fill #0

## 2024-01-06 MED ORDER — AZITHROMYCIN 250 MG PO TABS
ORAL_TABLET | ORAL | 0 refills | Status: DC
  Filled 2024-01-06: qty 6, 5d supply, fill #0

## 2024-01-06 MED ORDER — BENZONATATE 100 MG PO CAPS
100.0000 mg | ORAL_CAPSULE | Freq: Three times a day (TID) | ORAL | 0 refills | Status: DC | PRN
  Filled 2024-01-06: qty 45, 15d supply, fill #0

## 2024-01-06 MED ORDER — AZITHROMYCIN 250 MG PO TABS
ORAL_TABLET | ORAL | 0 refills | Status: DC
  Filled 2024-01-06: qty 6, fill #0

## 2024-01-06 MED ORDER — LORATADINE 10 MG PO TABS
10.0000 mg | ORAL_TABLET | Freq: Every day | ORAL | 11 refills | Status: AC
  Filled 2024-01-06: qty 100, 100d supply, fill #0
  Filled 2024-04-13: qty 100, 100d supply, fill #1
  Filled 2024-07-21: qty 100, 100d supply, fill #2

## 2024-01-06 NOTE — Progress Notes (Signed)
 Established Patient Office Visit  Subjective   Patient ID: Christine Meyer, female    DOB: 05/03/1951  Age: 73 y.o. MRN: [id number]  Chief Complaint  Patient presents with   Hypertension   Follow-up    HPI Discussed the use of AI scribe software for clinical note transcription with the patient, who gave verbal consent to proceed.  History of Present Illness Christine Meyer is a 73 year old female who presents with ear fullness and hearing difficulties.  She experiences ear fullness and difficulty hearing. Her goddaughter suggested that her ears might need cleaning. She has attempted to clean her ears herself but is unsure of its effectiveness. She has been using ear drops to assist with ear cleaning.  She has a persistent cough producing yellow phlegm, especially at night. The cough is significant enough to wake her, but she can return to sleep after expectorating the phlegm. No nasal congestion is present, but she reports head congestion. She uses inhalers regularly and denies current wheezing.  She has a history of receiving B12 shots administered by a nurse named Christine Meyer, but has switched to oral B12 supplements since Christine Meyer was unable to continue the injections. She takes one B12 supplement daily but feels the shots were more effective in managing her symptoms.  She recently underwent cataract surgery on both eyes, with the last procedure occurring the previous Thursday. She is scheduled for a follow-up with her regular eye doctor this Thursday.   Patient Active Problem List   Diagnosis Date Noted   B12 deficiency 10/07/2023   Early satiety 10/09/2022   IPF (idiopathic pulmonary fibrosis) (HCC) 07/11/2022   Preventative health care 01/23/2022   Hyperlipidemia associated with type 2 diabetes mellitus (HCC) 01/23/2022   Claudication in peripheral vascular disease (HCC) 12/08/2020   Mesenteric ischemia (HCC) 08/16/2020   Bronchitis 07/10/2020   COPD with acute exacerbation (HCC)  06/30/2020   Rheumatoid arthritis involving multiple sites (HCC) 01/21/2020   Coagulation disorder (HCC) 01/21/2020   Uncontrolled type 2 diabetes mellitus with hyperglycemia (HCC) 01/21/2020   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   Hyperlipidemia LDL goal <70 06/28/2018   Degenerative disc disease, cervical 03/27/2018   Gastric polyps - hyperplastic 02/23/2018   Antiplatelet or antithrombotic long-term use - clopidogrel  - CAD/PAD 02/13/2018   Osteopenia 01/26/2018   Dyspepsia 01/26/2018   Rotator cuff tear, left 01/24/2018   COPD (chronic obstructive pulmonary disease) (HCC) 07/26/2017   Weight loss 04/22/2017   Cough 04/22/2017   Current every day smoker 04/22/2017   Bilateral carotid artery disease (HCC) 07/04/2016   Claudication (HCC) 11/22/2014   Peripheral arterial disease (HCC) 10/22/2014   Renal artery stenosis, native, bilateral (HCC) 10/22/2014   Centrilobular emphysema (HCC) 10/19/2014   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   Pain in joint, lower leg 08/24/2014   Dyslipidemia 03/12/2011   Coronary atherosclerosis 09/19/2010   GERD 02/23/2010   Smoker 10/14/2009   EDEMA- LOCALIZED 04/06/2008   CAROTID BRUITS, BILATERAL 04/06/2008   Disorder resulting from impaired renal function 10/09/2007   DM (diabetes mellitus) type II, controlled, with peripheral vascular disorder (HCC) 07/07/2007   Normocytic anemia 07/07/2007   Essential hypertension 07/07/2007   HX, PERSONAL, MALIGNANCY, UTERUS NEC 07/07/2007   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   CAD (coronary artery disease)    Stent RCA 1999   Chronic kidney disease    COPD (chronic  obstructive pulmonary disease) (HCC)    Diabetes mellitus, type 2 (HCC)    Diet controlled   Gastric polyps - hyperplastic 02/23/2018   Hyperlipidemia    Hypertension    OSA (obstructive sleep apnea) [name]     PVD (peripheral vascular disease) (HCC)    Carotid stenosis, renal artery stenosis   Sleep apnea    dx'd but doesn't use mask    Uterine cancer (HCC) ?0865'H   Past Surgical History:  Procedure Laterality Date   ABDOMINAL ANGIOGRAM  11/22/2014   Procedure: ABDOMINAL ANGIOGRAM;  Surgeon: Avanell Leigh, MD;  Location: [facility] CATH LAB;  Service: Cardiovascular;;   ABDOMINAL AORTOGRAM W/LOWER EXTREMITY N/A 10/03/2020   Procedure: ABDOMINAL AORTOGRAM W/LOWER EXTREMITY;  Surgeon: Avanell Leigh, MD;  Location: [facility] INVASIVE CV LAB;  Service: Cardiovascular;  Laterality: N/A;   COLONOSCOPY  2000   negative   CORONARY ANGIOPLASTY WITH STENT PLACEMENT  1999   RCA; Dr. Ebbie Goldmann G4 P4   ESOPHAGOGASTRODUODENOSCOPY     ILIAC ARTERY STENT Right 11/22/2014   LOWER EXTREMITY ANGIOGRAM N/A 11/22/2014   Procedure: LOWER EXTREMITY ANGIOGRAM;  Surgeon: Avanell Leigh, MD; L-SFA 100%, 3v runoff, R-pCIA 95>>0% w/ 8 mm x 18 mm long  Balloon expandable stent; R-dCIA 67>>0% w/ 8 mm x 4 cm long Cordis Smart Nitinol self  expanding stent    LOWER EXTREMITY ANGIOGRAPHY Bilateral 12/08/2020   Procedure: Lower Extremity Angiography;  Surgeon: Avanell Leigh, MD;  Location: [facility] INVASIVE CV LAB;  Service: Cardiovascular;  Laterality: Bilateral;   PERIPHERAL VASCULAR ATHERECTOMY Left 12/08/2020   Procedure: PERIPHERAL VASCULAR ATHERECTOMY;  Surgeon: Avanell Leigh, MD;  Location: [facility] INVASIVE CV LAB;  Service: Cardiovascular;  Laterality: Left;  common iliac   PERIPHERAL VASCULAR INTERVENTION Left 12/08/2020   Procedure: PERIPHERAL VASCULAR INTERVENTION;  Surgeon: Avanell Leigh, MD;  Location: [facility] INVASIVE CV LAB;  Service: Cardiovascular;  Laterality: Left;  common iliac   RENAL ARTERY STENT Right May 2007; 08/2006   ; restenosis   TOTAL ABDOMINAL HYSTERECTOMY  ?8469'G    for uterine CA   VISCERAL ARTERY INTERVENTION N/A 10/03/2020   Procedure: VISCERAL ARTERY INTERVENTION;  Surgeon: Avanell Leigh, MD;  Location:  [facility] INVASIVE CV LAB;  Service: Cardiovascular;  Laterality: N/A;  SMA   Social History   Tobacco Use   Smoking status: Every Day    Current packs/day: 0.25    Average packs/day: 0.3 packs/day for 51.0 years (12.8 ttl pk-yrs)    Types: Cigarettes    Passive exposure: Current   Smokeless tobacco: Never   Tobacco comments:    1-2 cigarettes per day KD-10/31/23  Vaping Use   Vaping status: Never Used  Substance Use Topics   Alcohol use: No    Alcohol/week: 0.0 standard drinks of alcohol   Drug use: No   Social History   Socioeconomic History   Marital status: Single    Spouse name: Not on file   Number of children: 4   Years of education: Not on file   Highest education level: Not on file  Occupational History   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    Employer: [employer]  Tobacco Use   Smoking status: Every Day    Current packs/day: 0.25    Average packs/day: 0.3 packs/day for 51.0 years (12.8 ttl pk-yrs)    Types: Cigarettes    Passive exposure: Current   Smokeless tobacco: Never   Tobacco comments:  1-2 cigarettes per day KD-10/31/23  Vaping Use   Vaping status: Never Used  Substance and Sexual Activity   Alcohol use: No    Alcohol/week: 0.0 standard drinks of alcohol   Drug use: No   Sexual activity: Not Currently  Other Topics Concern   Not on file  Social History Narrative      Daughter Christine Meyer is CMA Adult nurse primary care [facility]   Retired Information systems manager   Smoker, no EtOH/drugs   Social Drivers of Corporate investment banker Strain: Low Risk  (08/01/2023)   Overall Financial Resource Strain (CARDIA)    Difficulty of Paying Living Expenses: Not hard at all  Food Insecurity: No Food Insecurity (08/01/2023)   Hunger Vital Sign    Worried About Running Out of Food in the Last Year: Never true    Ran Out of Food in the Last Year: Never true  Transportation Needs: No Transportation Needs (08/01/2023)    PRAPARE - Administrator, Civil Service (Medical): No    Lack of Transportation (Non-Medical): No  Physical Activity: Inactive (08/01/2023)   Exercise Vital Sign    Days of Exercise per Week: 0 days    Minutes of Exercise per Session: 0 min  Stress: No Stress Concern Present (08/01/2023)   [employer] of Occupational Health - Occupational Stress Questionnaire    Feeling of Stress : Not at all  Social Connections: Moderately Integrated (08/01/2023)   Social Connection and Isolation Panel [NHANES]    Frequency of Communication with Friends and Family: More than three times a week    Frequency of Social Gatherings with Friends and Family: More than three times a week    Attends Religious Services: More than 4 times per year    Active Member of [employer] or Organizations: Yes    Attends Banker Meetings: More than 4 times per year    Marital Status: Never married  Intimate Partner Violence: Not At Risk (08/01/2023)   Humiliation, Afraid, Rape, and Kick questionnaire    Fear of Current or Ex-Partner: No    Emotionally Abused: No    Physically Abused: No    Sexually Abused: No   Family Status  Relation Name Status   Mother  Deceased   Father  Deceased   Sister  (Not Specified)   Brother  Deceased   Brother  (Not Specified)   MGM  (Not Specified)   Daughter benita Rojek Deceased at age 27       MI/ stroke   Daughter Christine Meyer Alive   Mat Aunt  (Not Specified)   Oceanographer  (Not Specified)  No partnership data on file   Family History  Problem Relation Age of Onset   Heart failure Mother        after hip fx.    Hypertension Mother    Diabetes Mother    Deep vein thrombosis Mother    Hypertension Father    Diabetes Sister        insulin  dependent   Hypertension Brother    Emphysema Brother    Diabetes Brother        insulin  dependent   Diabetes Maternal Grandmother        non-insulin  dependent   Stroke Daughter    Heart attack Daughter    Breast  cancer Daughter    Brain cancer Daughter    Breast cancer Daughter    Diabetes Maternal Aunt        non-insulent dependent  Heart attack Paternal Aunt    No Known Allergies    Review of Systems  Constitutional:  Negative for chills, fever and malaise/fatigue.  HENT:  Positive for congestion. Negative for hearing loss.   Eyes:  Negative for blurred vision and discharge.  Respiratory:  Positive for cough, sputum production and shortness of breath. Negative for wheezing.   Cardiovascular:  Negative for chest pain, palpitations and leg swelling.  Gastrointestinal:  Negative for abdominal pain, blood in stool, constipation, diarrhea, heartburn, nausea and vomiting.  Genitourinary:  Negative for dysuria, frequency, hematuria and urgency.  Musculoskeletal:  Negative for back pain, falls and myalgias.  Skin:  Negative for rash.  Neurological:  Negative for dizziness, sensory change, loss of consciousness, weakness and headaches.  Endo/Heme/Allergies:  Negative for environmental allergies. Does not bruise/bleed easily.  Psychiatric/Behavioral:  Negative for depression and suicidal ideas. The patient is not nervous/anxious and does not have insomnia.       Objective:     BP (!) 148/60 (BP Location: Right Arm, Patient Position: Sitting, Cuff Size: Small)   Pulse 66   Temp 97.6 F (36.4 C) (Oral)   Resp 18   Ht 5\' 2"  (1.575 m)   Wt 98 lb 12.8 oz (44.8 kg)   SpO2 99%   BMI 18.07 kg/m  BP Readings from Last 3 Encounters:  01/06/24 (!) 148/60  10/31/23 110/64  10/07/23 118/60   Wt Readings from Last 3 Encounters:  01/06/24 98 lb 12.8 oz (44.8 kg)  10/31/23 98 lb 9.6 oz (44.7 kg)  10/07/23 98 lb 3.2 oz (44.5 kg)   SpO2 Readings from Last 3 Encounters:  01/06/24 99%  10/31/23 98%  10/07/23 98%      Physical Exam Vitals and nursing note reviewed.  Constitutional:      General: She is not in acute distress.    Appearance: Normal appearance.  HENT:     Head: Normocephalic  and atraumatic.     Right Ear: Tympanic membrane, ear canal and external ear normal. There is no impacted cerumen.     Left Ear: Tympanic membrane, ear canal and external ear normal. There is no impacted cerumen.     Nose: Nose normal. No congestion or rhinorrhea.     Right Sinus: No maxillary sinus tenderness or frontal sinus tenderness.     Left Sinus: No maxillary sinus tenderness or frontal sinus tenderness.     Mouth/Throat:     Mouth: Mucous membranes are moist.     Pharynx: Oropharynx is clear. No oropharyngeal exudate or posterior oropharyngeal erythema.  Eyes:     General: No scleral icterus.       Right eye: No discharge.        Left eye: No discharge.     Conjunctiva/sclera: Conjunctivae normal.  Cardiovascular:     Rate and Rhythm: Normal rate and regular rhythm.     Heart sounds: Normal heart sounds.  Pulmonary:     Effort: Pulmonary effort is normal. No respiratory distress.     Breath sounds: Normal breath sounds. No wheezing or rhonchi.  Musculoskeletal:     Cervical back: Normal range of motion and neck supple.  Lymphadenopathy:     Cervical: Cervical adenopathy present.  Skin:    General: Skin is warm and dry.  Neurological:     Mental Status: She is alert and oriented to person, place, and time.  Psychiatric:        Mood and Affect: Mood normal.  Behavior: Behavior normal.        Thought Content: Thought content normal.        Judgment: Judgment normal.      No results found for any visits on 01/06/24.  Last CBC Lab Results  Component Value Date   WBC 4.2 10/07/2023   HGB 11.1 (L) 10/07/2023   HCT 35.8 (L) 10/07/2023   MCV 96.3 10/07/2023   MCH 29.4 08/04/2021   RDW 17.1 (H) 10/07/2023   PLT 287.0 10/07/2023   Last metabolic panel Lab Results  Component Value Date   GLUCOSE 55 (L) 10/07/2023   NA 141 10/07/2023   K 4.3 10/07/2023   CL 103 10/07/2023   CO2 23 10/07/2023   BUN 12 10/07/2023   CREATININE 1.11 10/07/2023   GFR 48.95 (L)  10/07/2023   CALCIUM  9.3 10/07/2023   PROT 7.1 10/07/2023   ALBUMIN 4.3 10/07/2023   LABGLOB 2.5 05/16/2021   AGRATIO 1.7 05/16/2021   BILITOT 0.5 10/07/2023   ALKPHOS 71 10/07/2023   AST 18 10/07/2023   ALT 15 10/07/2023   ANIONGAP 12 08/04/2021   Last lipids Lab Results  Component Value Date   CHOL 169 10/07/2023   HDL 68.80 10/07/2023   LDLCALC 84 10/07/2023   TRIG 84.0 10/07/2023   CHOLHDL 2 10/07/2023   Last hemoglobin A1c Lab Results  Component Value Date   HGBA1C 6.0 10/07/2023   Last thyroid  functions Lab Results  Component Value Date   TSH 1.82 06/24/2023   T4TOTAL 6.5 06/24/2023   Last vitamin D  Lab Results  Component Value Date   VD25OH 62.88 10/07/2023   Last vitamin B12 and Folate Lab Results  Component Value Date   VITAMINB12 978 (H) 10/07/2023   FOLATE >24.0 02/28/2018      The ASCVD Risk score (Arnett DK, et al., 2019) failed to calculate for the following reasons:   Risk score cannot be calculated because patient has a medical history suggesting prior/existing ASCVD    Assessment & Plan:   Problem List Items Addressed This Visit       Unprioritized   Bronchitis - Primary   Relevant Medications   azithromycin  (ZITHROMAX  Z-PAK) 250 MG tablet   Uncontrolled type 2 diabetes mellitus with hyperglycemia (HCC)   Relevant Orders   Hemoglobin A1c   Hyperlipidemia LDL goal <70   Relevant Orders   CBC with Differential/Platelet   Comprehensive metabolic panel with GFR   Lipid panel   Cough   Relevant Medications   benzonatate  (TESSALON ) 100 MG capsule   Essential hypertension   Relevant Orders   CBC with Differential/Platelet   Comprehensive metabolic panel with GFR   Lipid panel   TSH   Vitamin B12   B12 deficiency   Other Visit Diagnoses       Cough in adult       Relevant Medications   loratadine  (CLARITIN ) 10 MG tablet     Bronchitis after surgery         Impacted cerumen of right ear         Assessment and  Plan Assessment & Plan Cough with yellow phlegm   Chronic cough with yellow phlegm suggests a possible infection, with significant nocturnal coughing and no nasal congestion. She is not currently using cough medication. Start Mucinex  for daytime mucus thinning and expectoration. Use Tessalon  Perles for nighttime cough suppression if needed. Prescribe Z-Pak (azithromycin ) for potential bacterial infection.  Shortness of breath   She experiences intermittent dyspnea during physical activity  without wheezing. Continues using her prescribed inhaler. No additional treatment is required.  Ear congestion   Ear congestion with fullness and possible hearing impairment is likely due to cerumen. Perform ear irrigation and use ear drops to facilitate cerumen removal.  Vitamin B12 deficiency   Her Vitamin B12 deficiency is managed with oral supplements, but absorption may be suboptimal. Check serum B12 levels to assess current status. Consider sublingual B12 tablets for better absorption if levels are suboptimal. Resume subcutaneous injections with family assistance if sublingual tablets are ineffective.    No follow-ups on file.    Herschell Virani R Lowne Chase, DO

## 2024-01-11 ENCOUNTER — Encounter: Payer: Self-pay | Admitting: Family Medicine

## 2024-01-27 ENCOUNTER — Telehealth: Payer: Self-pay

## 2024-01-27 NOTE — Telephone Encounter (Signed)
 Copied from CRM [id number]. Topic: Clinical - Medication Question >> Jan 27, 2024  9:09 AM Essie A wrote: Reason for CRM: Lashonda called for patient regarding a prescription for cough that keeps patient up at night.  The previous prescription is not helping the cough even though she is taking 3 times a day.  Please return her call to discuss what else she can take.  Phone number is [phone] to speak to patient.

## 2024-01-28 NOTE — Telephone Encounter (Signed)
 Pt called. LDVM and to return call to scheduled OV

## 2024-02-03 ENCOUNTER — Other Ambulatory Visit: Payer: Self-pay | Admitting: Pulmonary Disease

## 2024-02-03 DIAGNOSIS — J84112 Idiopathic pulmonary fibrosis: Secondary | ICD-10-CM

## 2024-02-04 ENCOUNTER — Ambulatory Visit (HOSPITAL_BASED_OUTPATIENT_CLINIC_OR_DEPARTMENT_OTHER)
Admission: RE | Admit: 2024-02-04 | Discharge: 2024-02-04 | Disposition: A | Discharge status: Home / Self Care | Source: Ambulatory Visit | Attending: Family Medicine | Admitting: Family Medicine

## 2024-02-04 ENCOUNTER — Ambulatory Visit (INDEPENDENT_AMBULATORY_CARE_PROVIDER_SITE_OTHER): Admitting: Family Medicine

## 2024-02-04 ENCOUNTER — Encounter: Payer: Self-pay | Admitting: Family Medicine

## 2024-02-04 ENCOUNTER — Other Ambulatory Visit (HOSPITAL_BASED_OUTPATIENT_CLINIC_OR_DEPARTMENT_OTHER): Payer: Self-pay

## 2024-02-04 ENCOUNTER — Ambulatory Visit: Payer: Self-pay | Admitting: Family Medicine

## 2024-02-04 VITALS — BP 120/58 | HR 70 | Temp 98.1°F | Resp 16 | Ht 62.0 in | Wt 97.6 lb

## 2024-02-04 DIAGNOSIS — R053 Chronic cough: Secondary | ICD-10-CM | POA: Diagnosis not present

## 2024-02-04 DIAGNOSIS — M8580 Other specified disorders of bone density and structure, unspecified site: Secondary | ICD-10-CM

## 2024-02-04 DIAGNOSIS — J438 Other emphysema: Secondary | ICD-10-CM | POA: Diagnosis not present

## 2024-02-04 DIAGNOSIS — R918 Other nonspecific abnormal finding of lung field: Secondary | ICD-10-CM | POA: Diagnosis not present

## 2024-02-04 DIAGNOSIS — R059 Cough, unspecified: Secondary | ICD-10-CM | POA: Diagnosis not present

## 2024-02-04 DIAGNOSIS — E538 Deficiency of other specified B group vitamins: Secondary | ICD-10-CM

## 2024-02-04 MED ORDER — VITAMIN D3 50 MCG (2000 UT) PO CAPS
2000.0000 [IU] | ORAL_CAPSULE | Freq: Every day | ORAL | 11 refills | Status: AC
  Filled 2024-02-04: qty 100, 100d supply, fill #0
  Filled 2024-07-21: qty 100, 100d supply, fill #1

## 2024-02-04 MED ORDER — CYANOCOBALAMIN 1000 MCG/ML IJ SOLN
1000.0000 ug | INTRAMUSCULAR | 3 refills | Status: AC
  Filled 2024-02-04: qty 3, 90d supply, fill #0

## 2024-02-04 MED ORDER — PROMETHAZINE-DM 6.25-15 MG/5ML PO SYRP
5.0000 mL | ORAL_SOLUTION | Freq: Four times a day (QID) | ORAL | 0 refills | Status: DC | PRN
  Filled 2024-02-04: qty 118, 6d supply, fill #0

## 2024-02-04 NOTE — Telephone Encounter (Signed)
 Refill sent for PIRFENIDONE  to [facility]: [phone]   Dose: 801mg  three times daily  Last OV: 10/31/2023 Provider: Dr. Waylan Haggard Pertinent labs: LFTS wnl on 01/06/24  Next OV: 04/20/24  Geraldene Kleine, PharmD, MPH, BCPS Clinical Pharmacist (Rheumatology and Pulmonology)

## 2024-02-04 NOTE — Assessment & Plan Note (Signed)
 Chronic cough Con't inhalers , f/u pulmonary Check cxr

## 2024-02-04 NOTE — Progress Notes (Signed)
 Established Patient Office Visit  Subjective   Patient ID: Christine Meyer, female    DOB: 1950-12-23  Age: 73 y.o. MRN: [id number]  Chief Complaint  Patient presents with   Cough    Pt states cough has improved but states cough is worse at night. Keeping her awake at night.    Follow-up    HPI Discussed the use of AI scribe software for clinical note transcription with the patient, who gave verbal consent to proceed.  History of Present Illness Christine Meyer is a 73 year old female who presents with a persistent cough.  She has been experiencing a persistent cough that predominantly occurs at night, ongoing since before her last visit on January 06, 2024. The cough is productive, yielding a small amount of yellow mucus. She uses inhalers, including two puffs of Stiletto in the morning and another inhaler as needed every six hours, but sometimes avoids it due to heart palpitations.  She has been taking Tessalon  Perles for the cough, but it has not been effective at night. A CT scan is scheduled for July, and she is also under the care of a pulmonologist.  She is currently taking vitamin D3, 1000 mg daily, and has been using B12 supplements. She has experienced a lack of energy, which she attributes to her active lifestyle, including cooking and cleaning.  She reports a lack of appetite and often feels cold, wearing multiple layers of clothing to stay warm. No fever is present.   Patient Active Problem List   Diagnosis Date Noted   B12 deficiency 10/07/2023   Early satiety 10/09/2022   IPF (idiopathic pulmonary fibrosis) (HCC) 07/11/2022   Preventative health care 01/23/2022   Hyperlipidemia associated with type 2 diabetes mellitus (HCC) 01/23/2022   Claudication in peripheral vascular disease (HCC) 12/08/2020   Mesenteric ischemia (HCC) 08/16/2020   Bronchitis 07/10/2020   COPD with acute exacerbation (HCC) 06/30/2020   Rheumatoid arthritis involving multiple sites (HCC)  01/21/2020   Coagulation disorder (HCC) 01/21/2020   Uncontrolled type 2 diabetes mellitus with hyperglycemia (HCC) 01/21/2020   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   Hyperlipidemia LDL goal <70 06/28/2018   Degenerative disc disease, cervical 03/27/2018   Gastric polyps - hyperplastic 02/23/2018   Antiplatelet or antithrombotic long-term use - clopidogrel  - CAD/PAD 02/13/2018   Osteopenia 01/26/2018   Dyspepsia 01/26/2018   Rotator cuff tear, left 01/24/2018   COPD (chronic obstructive pulmonary disease) (HCC) 07/26/2017   Weight loss 04/22/2017   Cough 04/22/2017   Current every day smoker 04/22/2017   Bilateral carotid artery disease (HCC) 07/04/2016   Claudication (HCC) 11/22/2014   Peripheral arterial disease (HCC) 10/22/2014   Renal artery stenosis, native, bilateral (HCC) 10/22/2014   Centrilobular emphysema (HCC) 10/19/2014   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   Pain in joint, lower leg 08/24/2014   Dyslipidemia 03/12/2011   Coronary atherosclerosis 09/19/2010   GERD 02/23/2010   Smoker 10/14/2009   EDEMA- LOCALIZED 04/06/2008   CAROTID BRUITS, BILATERAL 04/06/2008   Disorder resulting from impaired renal function 10/09/2007   DM (diabetes mellitus) type II, controlled, with peripheral vascular disorder (HCC) 07/07/2007   Normocytic anemia 07/07/2007   Essential hypertension 07/07/2007   HX, PERSONAL, MALIGNANCY, UTERUS NEC 07/07/2007   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   CAD (coronary artery disease)    Stent RCA 1999   Chronic kidney disease  COPD (chronic obstructive pulmonary disease) (HCC)    Diabetes mellitus, type 2 (HCC)    Diet controlled   Gastric polyps - hyperplastic 02/23/2018   Hyperlipidemia    Hypertension    OSA (obstructive sleep apnea) [name]    PVD (peripheral vascular disease) (HCC)    Carotid stenosis,  renal artery stenosis   Sleep apnea    dx'd but doesn't use mask    Uterine cancer (HCC) ?9562'Z   Past Surgical History:  Procedure Laterality Date   ABDOMINAL ANGIOGRAM  11/22/2014   Procedure: ABDOMINAL ANGIOGRAM;  Surgeon: Avanell Leigh, MD;  Location: [facility] CATH LAB;  Service: Cardiovascular;;   ABDOMINAL AORTOGRAM W/LOWER EXTREMITY N/A 10/03/2020   Procedure: ABDOMINAL AORTOGRAM W/LOWER EXTREMITY;  Surgeon: Avanell Leigh, MD;  Location: [facility] INVASIVE CV LAB;  Service: Cardiovascular;  Laterality: N/A;   COLONOSCOPY  2000   negative   CORONARY ANGIOPLASTY WITH STENT PLACEMENT  1999   RCA; Dr. Ebbie Goldmann G4 P4   ESOPHAGOGASTRODUODENOSCOPY     ILIAC ARTERY STENT Right 11/22/2014   LOWER EXTREMITY ANGIOGRAM N/A 11/22/2014   Procedure: LOWER EXTREMITY ANGIOGRAM;  Surgeon: Avanell Leigh, MD; L-SFA 100%, 3v runoff, R-pCIA 95>>0% w/ 8 mm x 18 mm long  Balloon expandable stent; R-dCIA 67>>0% w/ 8 mm x 4 cm long Cordis Smart Nitinol self  expanding stent    LOWER EXTREMITY ANGIOGRAPHY Bilateral 12/08/2020   Procedure: Lower Extremity Angiography;  Surgeon: Avanell Leigh, MD;  Location: [facility] INVASIVE CV LAB;  Service: Cardiovascular;  Laterality: Bilateral;   PERIPHERAL VASCULAR ATHERECTOMY Left 12/08/2020   Procedure: PERIPHERAL VASCULAR ATHERECTOMY;  Surgeon: Avanell Leigh, MD;  Location: [facility] INVASIVE CV LAB;  Service: Cardiovascular;  Laterality: Left;  common iliac   PERIPHERAL VASCULAR INTERVENTION Left 12/08/2020   Procedure: PERIPHERAL VASCULAR INTERVENTION;  Surgeon: Avanell Leigh, MD;  Location: [facility] INVASIVE CV LAB;  Service: Cardiovascular;  Laterality: Left;  common iliac   RENAL ARTERY STENT Right May 2007; 08/2006   ; restenosis   TOTAL ABDOMINAL HYSTERECTOMY  ?3086'V    for uterine CA   VISCERAL ARTERY INTERVENTION N/A 10/03/2020   Procedure: VISCERAL ARTERY INTERVENTION;  Surgeon: Avanell Leigh, MD;  Location: [facility] INVASIVE CV LAB;  Service: Cardiovascular;  Laterality:  N/A;  SMA   Social History   Tobacco Use   Smoking status: Every Day    Current packs/day: 0.25    Average packs/day: 0.3 packs/day for 51.0 years (12.8 ttl pk-yrs)    Types: Cigarettes    Passive exposure: Current   Smokeless tobacco: Never   Tobacco comments:    1-2 cigarettes per day KD-10/31/23  Vaping Use   Vaping status: Never Used  Substance Use Topics   Alcohol use: No    Alcohol/week: 0.0 standard drinks of alcohol   Drug use: No   Social History   Socioeconomic History   Marital status: Single    Spouse name: Not on file   Number of children: 4   Years of education: Not on file   Highest education level: Not on file  Occupational History   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    Employer: [employer]  Tobacco Use   Smoking status: Every Day    Current packs/day: 0.25    Average packs/day: 0.3 packs/day for 51.0 years (12.8 ttl pk-yrs)    Types: Cigarettes    Passive exposure: Current   Smokeless tobacco: Never   Tobacco comments:  1-2 cigarettes per day KD-10/31/23  Vaping Use   Vaping status: Never Used  Substance and Sexual Activity   Alcohol use: No    Alcohol/week: 0.0 standard drinks of alcohol   Drug use: No   Sexual activity: Not Currently  Other Topics Concern   Not on file  Social History Narrative      Daughter Macky Sayres is CMA Adult nurse primary care [facility]   Retired Information systems manager   Smoker, no EtOH/drugs   Social Drivers of Corporate investment banker Strain: Low Risk  (08/01/2023)   Overall Financial Resource Strain (CARDIA)    Difficulty of Paying Living Expenses: Not hard at all  Food Insecurity: No Food Insecurity (08/01/2023)   Hunger Vital Sign    Worried About Running Out of Food in the Last Year: Never true    Ran Out of Food in the Last Year: Never true  Transportation Needs: No Transportation Needs (08/01/2023)   PRAPARE - Administrator, Civil Service (Medical):  No    Lack of Transportation (Non-Medical): No  Physical Activity: Inactive (08/01/2023)   Exercise Vital Sign    Days of Exercise per Week: 0 days    Minutes of Exercise per Session: 0 min  Stress: No Stress Concern Present (08/01/2023)   [employer] of Occupational Health - Occupational Stress Questionnaire    Feeling of Stress : Not at all  Social Connections: Moderately Integrated (08/01/2023)   Social Connection and Isolation Panel [NHANES]    Frequency of Communication with Friends and Family: More than three times a week    Frequency of Social Gatherings with Friends and Family: More than three times a week    Attends Religious Services: More than 4 times per year    Active Member of [employer] or Organizations: Yes    Attends Banker Meetings: More than 4 times per year    Marital Status: Never married  Intimate Partner Violence: Not At Risk (08/01/2023)   Humiliation, Afraid, Rape, and Kick questionnaire    Fear of Current or Ex-Partner: No    Emotionally Abused: No    Physically Abused: No    Sexually Abused: No   Family Status  Relation Name Status   Mother  Deceased   Father  Deceased   Sister  (Not Specified)   Brother  Deceased   Brother  (Not Specified)   MGM  (Not Specified)   Daughter benita Woessner Deceased at age 79       MI/ stroke   Daughter LUCY Alive   Mat Aunt  (Not Specified)   Oceanographer  (Not Specified)  No partnership data on file   Family History  Problem Relation Age of Onset   Heart failure Mother        after hip fx.    Hypertension Mother    Diabetes Mother    Deep vein thrombosis Mother    Hypertension Father    Diabetes Sister        insulin  dependent   Hypertension Brother    Emphysema Brother    Diabetes Brother        insulin  dependent   Diabetes Maternal Grandmother        non-insulin  dependent   Stroke Daughter    Heart attack Daughter    Breast cancer Daughter    Brain cancer Daughter    Breast cancer  Daughter    Diabetes Maternal Aunt        non-insulent dependent  Heart attack Paternal Aunt    No Known Allergies    Review of Systems  Constitutional:  Negative for fever and malaise/fatigue.  HENT:  Negative for congestion.   Eyes:  Negative for blurred vision.  Respiratory:  Positive for cough and sputum production. Negative for hemoptysis, shortness of breath and wheezing.   Cardiovascular:  Negative for chest pain, palpitations and leg swelling.  Gastrointestinal:  Negative for vomiting.  Musculoskeletal:  Negative for back pain.  Skin:  Negative for rash.  Neurological:  Negative for loss of consciousness and headaches.      Objective:     BP (!) 120/58 (BP Location: Right Arm, Patient Position: Sitting, Cuff Size: Small)   Pulse 70   Temp 98.1 F (36.7 C) (Oral)   Resp 16   Ht 5\' 2"  (1.575 m)   Wt 97 lb 9.6 oz (44.3 kg)   SpO2 96%   BMI 17.85 kg/m  BP Readings from Last 3 Encounters:  02/04/24 (!) 120/58  01/06/24 (!) 148/60  10/31/23 110/64   Wt Readings from Last 3 Encounters:  02/04/24 97 lb 9.6 oz (44.3 kg)  01/06/24 98 lb 12.8 oz (44.8 kg)  10/31/23 98 lb 9.6 oz (44.7 kg)   SpO2 Readings from Last 3 Encounters:  02/04/24 96%  01/06/24 99%  10/31/23 98%      Physical Exam Vitals and nursing note reviewed.  Constitutional:      General: She is not in acute distress.    Appearance: Normal appearance. She is well-developed.  HENT:     Head: Normocephalic and atraumatic.  Eyes:     General: No scleral icterus.       Right eye: No discharge.        Left eye: No discharge.  Cardiovascular:     Rate and Rhythm: Normal rate and regular rhythm.     Heart sounds: No murmur heard. Pulmonary:     Effort: Pulmonary effort is normal. No respiratory distress.     Breath sounds: Normal breath sounds.  Musculoskeletal:        General: Normal range of motion.     Cervical back: Normal range of motion and neck supple.     Right lower leg: No edema.      Left lower leg: No edema.  Skin:    General: Skin is warm and dry.  Neurological:     Mental Status: She is alert and oriented to person, place, and time.  Psychiatric:        Mood and Affect: Mood normal.        Behavior: Behavior normal.        Thought Content: Thought content normal.        Judgment: Judgment normal.      No results found for any visits on 02/04/24.  Last CBC Lab Results  Component Value Date   WBC 4.2 01/06/2024   HGB 11.4 (L) 01/06/2024   HCT 35.0 (L) 01/06/2024   MCV 90.6 01/06/2024   MCH 29.4 08/04/2021   RDW 14.9 01/06/2024   PLT 233.0 01/06/2024   Last metabolic panel Lab Results  Component Value Date   GLUCOSE 71 01/06/2024   NA 140 01/06/2024   K 3.9 01/06/2024   CL 104 01/06/2024   CO2 28 01/06/2024   BUN 12 01/06/2024   CREATININE 1.16 01/06/2024   GFR 46.35 (L) 01/06/2024   CALCIUM  9.0 01/06/2024   PROT 6.8 01/06/2024   ALBUMIN 4.0 01/06/2024   LABGLOB 2.5 05/16/2021  AGRATIO 1.7 05/16/2021   BILITOT 0.4 01/06/2024   ALKPHOS 66 01/06/2024   AST 15 01/06/2024   ALT 14 01/06/2024   ANIONGAP 12 08/04/2021   Last lipids Lab Results  Component Value Date   CHOL 142 01/06/2024   HDL 54.10 01/06/2024   LDLCALC 72 01/06/2024   TRIG 80.0 01/06/2024   CHOLHDL 3 01/06/2024   Last hemoglobin A1c Lab Results  Component Value Date   HGBA1C 6.2 01/06/2024   Last thyroid  functions Lab Results  Component Value Date   TSH 3.21 01/06/2024   T4TOTAL 6.5 06/24/2023   Last vitamin D  Lab Results  Component Value Date   VD25OH 62.88 10/07/2023   Last vitamin B12 and Folate Lab Results  Component Value Date   VITAMINB12 871 01/06/2024   FOLATE >24.0 02/28/2018      The ASCVD Risk score (Arnett DK, et al., 2019) failed to calculate for the following reasons:   Risk score cannot be calculated because patient has a medical history suggesting prior/existing ASCVD    Assessment & Plan:   Problem List Items Addressed This Visit        Unprioritized   Osteopenia   Relevant Medications   Cholecalciferol (VITAMIN D3) 50 MCG (2000 UT) capsule   Cough   Relevant Medications   promethazine-dextromethorphan  (PROMETHAZINE-DM) 6.25-15 MG/5ML syrup   Other Relevant Orders   DG Chest 2 View   B12 deficiency - Primary   Relevant Medications   cyanocobalamin  (VITAMIN B12) 1000 MCG/ML injection   COPD (chronic obstructive pulmonary disease) (HCC)   Chronic cough Con't inhalers , f/u pulmonary Check cxr       Relevant Medications   promethazine-dextromethorphan  (PROMETHAZINE-DM) 6.25-15 MG/5ML syrup  Assessment and Plan Assessment & Plan Cough   She has a chronic nocturnal cough persisting for over a month, unresponsive to Tessalon  Perles. The cough is productive with yellowish sputum, but her lungs are clear on auscultation. A chest x-ray is needed to rule out underlying pulmonary issues before the scheduled CT scan in July. Prescribe cough syrup for nighttime use, noting its potential sedative effects. She should contact Dr. Manum if symptoms do not improve, as he may want to see her sooner or order a CT scan before July.  Anemia, unspecified   She has slight anemia consistent with previous levels, not severe enough to cause fatigue. Consider multivitamin with iron  supplementation.  General Health Maintenance   Vitamin D  and B12 supplementation were discussed. Her current vitamin D  level is adequate with over-the-counter supplementation. B12 injections are available, but oral supplementation may suffice given current levels. Recommend over-the-counter vitamin D3, 1000 IU daily. Discuss B12 supplementation options: continue with injections or consider sublingual B12 if injections are inconvenient.    No follow-ups on file.    Christine Philley R Lowne Chase, DO

## 2024-02-06 DIAGNOSIS — Z961 Presence of intraocular lens: Secondary | ICD-10-CM | POA: Diagnosis not present

## 2024-02-06 DIAGNOSIS — H52223 Regular astigmatism, bilateral: Secondary | ICD-10-CM | POA: Diagnosis not present

## 2024-02-18 ENCOUNTER — Ambulatory Visit (INDEPENDENT_AMBULATORY_CARE_PROVIDER_SITE_OTHER): Admitting: Internal Medicine

## 2024-02-18 ENCOUNTER — Ambulatory Visit: Payer: Self-pay

## 2024-02-18 ENCOUNTER — Encounter: Payer: Self-pay | Admitting: Internal Medicine

## 2024-02-18 VITALS — BP 103/61 | HR 73 | Temp 97.5°F | Ht 62.0 in | Wt 97.0 lb

## 2024-02-18 DIAGNOSIS — R0602 Shortness of breath: Secondary | ICD-10-CM | POA: Diagnosis not present

## 2024-02-18 DIAGNOSIS — J84112 Idiopathic pulmonary fibrosis: Secondary | ICD-10-CM | POA: Diagnosis not present

## 2024-02-18 DIAGNOSIS — F17210 Nicotine dependence, cigarettes, uncomplicated: Secondary | ICD-10-CM

## 2024-02-18 DIAGNOSIS — D649 Anemia, unspecified: Secondary | ICD-10-CM | POA: Diagnosis not present

## 2024-02-18 MED ORDER — PREDNISONE 20 MG PO TABS: ORAL_TABLET | ORAL | 0 refills | Status: DC

## 2024-02-18 MED ORDER — AZITHROMYCIN 250 MG PO TABS: ORAL_TABLET | ORAL | 0 refills | Status: DC

## 2024-02-18 NOTE — Patient Instructions (Addendum)
 Scripts sent for prednisone - 1 tab daily x 5 days, then stop  Script sent for Zpak antibiotic   2 today then one daily  Keep the appointments for tests and your visit with Dr Waylan Haggard

## 2024-02-18 NOTE — Progress Notes (Signed)
 02/18/24- 73 yoF followed by Dr Waylan Haggard for IPF, COPD, Tobacco use. Last seen 10/31/23 Acute visit per E2C2- dyspnea    Brought by daughter. Updated CT chest and PFT due later this summer. -Esbriet  3x/day, Stiolto 2.5,  CXR 02/04/24- Redemonstrated findings consistent with emphysema and underlying interstitial lung disease (pulmonary fibrosis). No superimposed pneumonia or pleural effusion. Discussed the use of AI scribe software for clinical note transcription with the patient, who gave verbal consent to proceed.  History of Present Illness   Christine Meyer is a 73 year old female with pulmonary fibrosis who presents with worsening shortness of breath.   She has experienced worsening dyspnea for four days, especially with exertion, but is comfortable at rest. There is no orthopnea, chest pain, palpitations, or peripheral edema. She occasionally expectorates yellow sputum, consistent with her baseline. Inhalers provide minimal relief. No obvious infectious exposure or triggering event noted.   Her medical history includes pulmonary fibrosis managed with Esbriet  (pirfenidone ) and anemia with a stable low red blood cell count. She has a cardiac stent and does not use home oxygen. Arrival O2 sat on room air at rest 96%.  She is a light smoker, abstaining since last Thursday. Her diabetes resolved with weight loss. She has a decreased appetite but maintains regular eating habits.     Assessment and Plan:    Pulmonary fibrosis Chronic pulmonary fibrosis with recent exacerbation of dyspnea. Differential includes possible airway infection. Smoking cessation efforts ongoing. - Prescribe  prednisone  for airway inflammation. - Prescribe Z-Pak for possible infection. - Advise continuation of inhaler regimen. - Encourage smoking cessation. -Keep f/u appointments for testing updates and for OV with Dr Waylan Haggard.  Shortness of breath Exacerbation of dyspnea, primarily on exertion. Possible airway infection  contribution. - Prescribe prednisone  and Z-Pak.  Anemia Chronic mild anemia, not contributing to current symptoms.  History of diabetes mellitus Resolved diabetes mellitus with no impact on pulmonary symptoms.     ROS-see HPI  + = positive Constitutional:    weight loss, night sweats, fevers, chills, fatigue, lassitude. HEENT:    headaches, difficulty swallowing, tooth/dental problems, sore throat,       sneezing, itching, ear ache, nasal congestion, post nasal drip, snoring CV:    chest pain, orthopnea, PND, swelling in lower extremities, anasarca,                                   dizziness, palpitations Resp:   +shortness of breath with exertion or at rest.                +productive cough,   non-productive cough, coughing up of blood.             change in color of mucus.  wheezing.   Skin:    rash or lesions. GI:  No-   heartburn, indigestion, abdominal pain, nausea, vomiting, diarrhea,                 change in bowel habits, loss of appetite GU: dysuria, change in color of urine, no urgency or frequency.   flank pain. MS:   joint pain, stiffness, decreased range of motion, back pain. Neuro-     nothing unusual Psych:  change in mood or affect.  depression or anxiety.   memory loss.  OBJ- Physical Exam General- Alert, Oriented, Affect-appropriate,  +petite elderly lady in no acute distress Skin- rash-none, lesions- none, excoriation- none Lymphadenopathy- none  Head- atraumatic            Eyes- Gross vision intact, PERRLA, conjunctivae and secretions clear            Ears- Hearing, canals-normal            Nose- Clear, no-Septal dev, mucus, polyps, erosion, perforation             Throat- Mallampati II , mucosa clear , drainage- none, tonsils- atrophic Neck- flexible , trachea midline, no stridor , thyroid  nl, carotid no bruit Chest - symmetrical excursion , unlabored           Heart/CV- RRR , no murmur , no gallop  , no rub, nl s1 s2                           - JVD- none ,  edema- none, stasis changes- none, varices- none           Lung- +distinct crackles in lower zones wheeze- none, cough- none , dullness-none, rub- none, no increased work of breathing at rest           Chest wall-  Abd-  Br/ Gen/ Rectal- Not done, not indicated Extrem- cyanosis- none, clubbing, none, atrophy- none, strength- nl Neuro- grossly intact to observation

## 2024-02-18 NOTE — Telephone Encounter (Signed)
 CT shows chronic changes of pulmonary fibrosis with no acute process.  Looks like she is already scheduled for an acute visit later today with Dr. Linder Revere.

## 2024-02-18 NOTE — Telephone Encounter (Signed)
 FYI Only or Action Required?: FYI only for provider  Patient is followed in Pulmonology for Pulmonary Fibrosis, last seen on 10/31/2023 by Mannam, Praveen, MD. Called Nurse Triage reporting No chief complaint on file.. Symptoms began today. Interventions attempted: Rescue inhaler and Maintenance inhaler. Symptoms are: unchanged.  Triage Disposition: Call PCP When Office is Open  Patient/caregiver understands and will follow disposition?: Yes   Copied from CRM [id number]. Topic: Clinical - Red Word Triage >> Feb 18, 2024  8:55 AM Justina Oman C wrote: Red Word that prompted transfer to Nurse Triage: Patient's god daughter Francina Irish [phone] states patient saw Dr. Jalene Mayor and had chest xray 02/04/24, because of symptoms of eaily winded and shortness of breath started last Friday. Patient denies no pain. Francina Irish would like patient to see Dr. Waylan Haggard today or this week or go to the ER? Please advise. Reason for Disposition  Requesting regular office appointment  Answer Assessment - Initial Assessment Questions 1. REASON FOR CALL or QUESTION: "What is your reason for calling today?" or "How can I best help you?" or "What question do you have that I can help answer?"     God Daughter, Listed on the DPR, calling on behalf of the patient to make an appointment due to symptoms after seeing PCP.  Protocols used: Information Only Call - No Triage-A-AH

## 2024-02-19 ENCOUNTER — Other Ambulatory Visit: Payer: Self-pay | Admitting: Internal Medicine

## 2024-03-05 ENCOUNTER — Other Ambulatory Visit: Payer: Self-pay | Admitting: Family Medicine

## 2024-03-05 DIAGNOSIS — I10 Essential (primary) hypertension: Secondary | ICD-10-CM

## 2024-03-17 ENCOUNTER — Ambulatory Visit
Admission: RE | Admit: 2024-03-17 | Discharge: 2024-03-17 | Disposition: A | Discharge status: Home / Self Care | Source: Ambulatory Visit | Attending: Pulmonary Disease | Admitting: Pulmonary Disease

## 2024-03-17 DIAGNOSIS — I2510 Atherosclerotic heart disease of native coronary artery without angina pectoris: Secondary | ICD-10-CM | POA: Diagnosis not present

## 2024-03-17 DIAGNOSIS — J439 Emphysema, unspecified: Secondary | ICD-10-CM | POA: Diagnosis not present

## 2024-03-17 DIAGNOSIS — J8410 Pulmonary fibrosis, unspecified: Secondary | ICD-10-CM | POA: Diagnosis not present

## 2024-03-17 DIAGNOSIS — J84112 Idiopathic pulmonary fibrosis: Secondary | ICD-10-CM

## 2024-03-24 ENCOUNTER — Ambulatory Visit: Payer: Self-pay | Admitting: Pulmonary Disease

## 2024-04-13 ENCOUNTER — Other Ambulatory Visit: Payer: Self-pay | Admitting: Pulmonary Disease

## 2024-04-13 ENCOUNTER — Other Ambulatory Visit (HOSPITAL_BASED_OUTPATIENT_CLINIC_OR_DEPARTMENT_OTHER): Payer: Self-pay

## 2024-04-20 ENCOUNTER — Encounter: Payer: Self-pay | Admitting: Pulmonary Disease

## 2024-04-20 ENCOUNTER — Ambulatory Visit: Admitting: Pulmonary Disease

## 2024-04-20 VITALS — BP 148/67 | HR 96 | Ht 62.0 in | Wt 98.0 lb

## 2024-04-20 DIAGNOSIS — R0600 Dyspnea, unspecified: Secondary | ICD-10-CM | POA: Diagnosis not present

## 2024-04-20 DIAGNOSIS — Z5181 Encounter for therapeutic drug level monitoring: Secondary | ICD-10-CM

## 2024-04-20 DIAGNOSIS — J84112 Idiopathic pulmonary fibrosis: Secondary | ICD-10-CM | POA: Diagnosis not present

## 2024-04-20 DIAGNOSIS — F17210 Nicotine dependence, cigarettes, uncomplicated: Secondary | ICD-10-CM

## 2024-04-20 LAB — PULMONARY FUNCTION TEST
DL/VA % pred: 43 %
DL/VA: 1.82 ml/min/mmHg/L
DLCO unc % pred: 26 %
DLCO unc: 4.83 ml/min/mmHg
FEF 25-75 Pre: 1.12 L/s
FEF2575-%Pred-Pre: 69 %
FEV1-%Change-Post: -22 %
FEV1-%Pred-Post: 53 %
FEV1-%Pred-Pre: 69 %
FEV1-Post: 1.09 L
FEV1-Pre: 1.4 L
FEV1FVC-%Change-Post: 7 %
FEV1FVC-%Pred-Pre: 100 %
FEV6-%Change-Post: -25 %
FEV6-%Pred-Post: 52 %
FEV6-%Pred-Pre: 70 %
FEV6-Post: 1.34 L
FEV6-Pre: 1.81 L
FEV6FVC-%Change-Post: 2 %
FEV6FVC-%Pred-Post: 105 %
FEV6FVC-%Pred-Pre: 102 %
FVC-%Change-Post: -27 %
FVC-%Pred-Post: 49 %
FVC-%Pred-Pre: 68 %
FVC-Post: 1.34 L
FVC-Pre: 1.85 L
Post FEV1/FVC ratio: 81 %
Post FEV6/FVC ratio: 100 %
Pre FEV1/FVC ratio: 75 %
Pre FEV6/FVC Ratio: 97 %
RV % pred: 126 %
RV: 2.84 L
TLC % pred: 90 %
TLC: 4.46 L

## 2024-04-20 LAB — COMPREHENSIVE METABOLIC PANEL WITH GFR
ALT: 16 U/L (ref 0–35)
AST: 18 U/L (ref 0–37)
Albumin: 4.2 g/dL (ref 3.5–5.2)
Alkaline Phosphatase: 70 U/L (ref 39–117)
BUN: 13 mg/dL (ref 6–23)
CO2: 27 meq/L (ref 19–32)
Calcium: 9.2 mg/dL (ref 8.4–10.5)
Chloride: 106 meq/L (ref 96–112)
Creatinine, Ser: 1.21 mg/dL — ABNORMAL HIGH (ref 0.40–1.20)
GFR: 43.97 mL/min — ABNORMAL LOW (ref >60.00)
Glucose, Bld: 86 mg/dL (ref 70–99)
Potassium: 3.9 meq/L (ref 3.5–5.1)
Sodium: 141 meq/L (ref 135–145)
Total Bilirubin: 0.5 mg/dL (ref 0.2–1.2)
Total Protein: 7.1 g/dL (ref 6.0–8.3)

## 2024-04-20 LAB — BRAIN NATRIURETIC PEPTIDE: Pro B Natriuretic peptide (BNP): 113 pg/mL — ABNORMAL HIGH (ref 0.0–100.0)

## 2024-04-20 NOTE — Progress Notes (Signed)
 Full pft performed today

## 2024-04-20 NOTE — Progress Notes (Signed)
 Modelle Vollmer    [id number]    May 05, 1951  Primary Care Physician:Lowne Cyndee Jamee SAUNDERS, DO  Referring Physician: Jiana Lemaire, MD [address] [place],  [place] [address]  Chief complaint:  Follow-up for COPD, idiopathic pulmonary fibrosis On Esbriet  since 2016 and is tolerating it well.  HPI: Christine Meyer was diagnosed with IPF in 2016 after CT scan showing UIP pulmonary fibrosis and centrilobular emphysema.  She has significant smoking history.  Evaluated by rheumatology in 2016 by Dr. Ishmael for mildly elevated rheumatoid factor with no evidence of connective tissue disease She is started on Esbriet  in 2016   Interim history: Discussed the use of AI scribe software for clinical note transcription with the patient, who gave verbal consent to proceed.  History of Present Illness Christine Meyer is a 73 year old female with idiopathic pulmonary fibrosis who presents with shortness of breath.  Dyspnea - Intermittent shortness of breath - Symptoms relieved by sitting down  Idiopathic pulmonary fibrosis management - Currently taking pirfenidone  three times daily with meals - No issues with pirfenidone  except for decreased appetite - Ensures food intake to take medication - Liver function tests last checked four months ago - No risk factors for liver disease such as hepatitis or alcohol use  Appetite changes - Decreased appetite - Maintains minimal food intake to facilitate medication administration  Tobacco use - Current smoker, approximately one cigarette per day, typically with coffee - History of tobacco use - Family encourages cessation, but has not quit completely  Cardiac history - History of coronary stent placement several years ago - Follows with cardiologist for ongoing cardiac care - Next cardiology appointment scheduled for November or December    Relevant pulmonary history Pets: Dogs, no cats, birds 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  No mold, hot tub, Jacuzzi, no feather pillows or comforter Smoking history: 13-pack-year smoker.  Continues to smoke every day Travel history: No significant travel history Relevant family history: No significant family history of lung disease  Outpatient Encounter Medications as of 04/20/2024  Medication Sig   ACCU-CHEK SOFTCLIX LANCETS lancets Check blood sugar once daily. Dx:E11.9   aspirin  EC 81 MG tablet Take 1 tablet (81 mg total) by mouth daily.   atorvastatin  (LIPITOR ) 80 MG tablet TAKE 1 TABLET BY MOUTH ONCE  DAILY   azithromycin  (ZITHROMAX ) 250 MG tablet 2 today then one daily- antibiotic   Blood Glucose Monitoring Suppl (ONETOUCH VERIO) w/Device KIT Use daily to check blood sugar.   DX E11.9   Calcium  500-100 MG-UNIT CHEW Chew 1 tablet by mouth daily before breakfast.   Cholecalciferol  (VITAMIN D3) 50 MCG (2000 UT) capsule Take 1 capsule (2,000 Units total) by mouth daily.   clopidogrel  (PLAVIX ) 75 MG tablet TAKE 1 TABLET BY MOUTH DAILY   cyanocobalamin  (VITAMIN B12) 1000 MCG/ML injection Inject 1 mL (1,000 mcg total) into the muscle every 30 (thirty) days.   cyanocobalamin  (VITAMIN B12) 1000 MCG/ML injection Inject 1 mL (1,000 mcg total) into the skin every 30 (thirty) days.   FEROSUL 325 (65 Fe) MG tablet TAKE 1 TABLET BY MOUTH DAILY   glucose blood (ONETOUCH VERIO) test strip USE AS DIRECTED DAILY   guaiFENesin  (MUCINEX ) 600 MG 12 hr tablet Take 1 tablet (600 mg total) by mouth 2 (two) times daily.   INSULIN  SYRINGE .5CC/29G 29G X 1/2 0.5 ML MISC Inject 1 each into the muscle every 30 (thirty) days.   loratadine  (CLARITIN )  10 MG tablet Take 1 tablet (10 mg total) by mouth daily.   metoprolol  succinate (TOPROL -XL) 25 MG 24 hr tablet Take 1 tablet (25 mg total) by mouth daily. TAKE WITH OR IMMEDIATELY FOLLOWING A MEAL.   mirtazapine  (REMERON ) 7.5 MG tablet Take 1 tablet (7.5 mg total) by mouth at bedtime.   Multiple Vitamins-Minerals (A THRU Z  SELECT 50+ ADVANCED) TABS TAKE 1 TABLET BY MOUTH DAILY (Patient taking differently: Take 1 tablet by mouth daily.)   olmesartan  (BENICAR ) 20 MG tablet TAKE 1 TABLET BY MOUTH DAILY   pantoprazole  (PROTONIX ) 40 MG tablet TAKE 1 TABLET(40 MG) BY MOUTH DAILY BEFORE BREAKFAST   Pirfenidone  801 MG TABS TAKE 1 TABLET (801MG ) BY MOUTH  THREE TIMES DAILY WITH FOOD   predniSONE  (DELTASONE ) 20 MG tablet 1 each morning till gone   promethazine -dextromethorphan  (PROMETHAZINE -DM) 6.25-15 MG/5ML syrup Take 5 mLs by mouth 4 (four) times daily as needed.   spironolactone  (ALDACTONE ) 25 MG tablet TAKE 1 TABLET BY MOUTH DAILY   STIOLTO RESPIMAT  2.5-2.5 MCG/ACT AERS USE 2 INHALATIONS BY MOUTH DAILY   sucralfate  (CARAFATE ) 1 g tablet Take 1 tablet (1 g total) by mouth 4 (four) times daily -  with meals and at bedtime.   SYRINGE-NEEDLE, DISP, 3 ML (BD INTEGRA SYRINGE) 25G X 1 3 ML MISC Use to inject b12 once a month.   Tiotropium Bromide -Olodaterol (STIOLTO RESPIMAT ) 2.5-2.5 MCG/ACT AERS Inhale 2 puffs into the lungs daily.   VENTOLIN  HFA 108 (90 Base) MCG/ACT inhaler INHALE 1 TO 2 PUFFS INTO THE LUNGS EVERY 6 HOURS AS NEEDED FOR WHEEZING OR SHORTNESS OF BREATH   No facility-administered encounter medications on file as of 04/20/2024.    Allergies as of 04/20/2024   (No Known Allergies)    Vitals:   04/20/24 0928  BP: (!) 148/67  Pulse: 96  Height: 5' 2 (1.575 m)  Weight: 98 lb (44.5 kg)  SpO2: 99%  BMI (Calculated): 17.92    Physical Exam GEN: No acute distress. CV: Regular rate and rhythm, no murmurs. LUNGS: Crackles present due to lung scarring. SKIN JOINTS: Warm and dry, no rash. EXTREMITIES: No significant clubbing of fingers.    Data Reviewed: Imaging: CT chest 05/09/2018-mild emphysema, slight progression of interstitial lung disease with UIP pattern CT high-resolution 02/16/2020-mild emphysema, stable UIP pulmonary fibrosis. CT high-resolution 08/15/2020- stable pattern of pulmonary fibrosis,  emphysema Chest x-ray 07/10/2022-stable pulmonary fibrosis CT abdomen pelvis 10/19/2022-chronic interstitial lung disease at the base. High resolution CT 04/22/2023-minimally progressive pattern of pulmonary fibrosis in UIP High-res CT 03/17/2024-stable pattern of pulmonary fibrosis and UIP pattern, severe emphysema. I have reviewed the images personally.  PFTs: 08/19/19 FVC 2.28 [101%], FEV1 1.78 [101%], F/F 78, TLC 3.86 [78%], DLCO 9.31 [49%]  09/08/2020 FVC 1.65 [74%], FEV1 1.05 [61%], F/F 64, TLC 3.64 [74%], DLCO 6.32 (33%) Moderate restriction with severe diffusion defect  10/31/2022 FVC 1.69 [61%], FEV1 1.22 [58%], F/F72, TLC 4.90 [64%], Moderate restriction.  Unable to complete DLCO  04/20/2024 FVC 1.34 [49%], FEV1 1.09 [53%], F/F81, TLC 4.46 [90%], DLCO 4.83 [26%] Severe diffusion defect  Labs: 10/19/2014 ANA, Aldolase, CCP, SCL 70, SSA/SSB, Jo-1 all negative; however ESR 40, RF 17 (both elevated) Hepatic panel 10/07/2023-stable  Cardiac: Echocardiogram 11/11/2014-normal LV function, mild LVH.  LVEF 55-60%, trivial tricuspid regurgitation.  PA peak pressure 12 mm  Assessment & Plan Idiopathic pulmonary fibrosis Idiopathic pulmonary fibrosis with lung scarring.  No current issues with pirfenidone  and has been on this therapy since 2016. LFTs stable, no risk  factors for liver issues. Crackles on lung auscultation, consistent with scarring.  Counseled that smoking may reduce pirfenidone  efficacy. - Continue pirfenidone  three times daily with meals - Order echocardiogram to evaluate for right heart strain - Perform liver function tests today  Tobacco use disorder Ongoing tobacco use disorder with occasional smoking, particularly with coffee. Advised to quit smoking, supported by her daughter, but she is not ready to quit.  Time spent counseling-5 minutes.  Reassess at return visit - Advise cessation of smoking to improve medication efficacy   Chronic Obstructive Pulmonary Disease  (COPD) COPD managed with Stiolto inhaler once daily. - Continue Stiolto inhaler once daily  Plan/Recommendations: - Continue Esbriet .   - Check labs for monitoring  Lonna Coder MD [facility] 04/20/2024, 9:31 AM  CC: Adrion Menz, MD

## 2024-04-20 NOTE — Patient Instructions (Signed)
 Full pft performed today

## 2024-04-20 NOTE — Patient Instructions (Signed)
  VISIT SUMMARY: Today, you were seen for your ongoing management of idiopathic pulmonary fibrosis and related symptoms. We discussed your shortness of breath, current medication regimen, appetite changes, and tobacco use. Additionally, we reviewed your cardiac history and planned further evaluations.  YOUR PLAN: -IDIOPATHIC PULMONARY FIBROSIS: Idiopathic pulmonary fibrosis is a condition where the lungs become scarred and breathing becomes difficult. You will continue taking pirfenidone three times daily with meals. We will perform a chest CT to assess your lung condition, an echocardiogram to check for any strain on your heart, and liver function tests today to ensure your liver is handling the medication well.  -TOBACCO USE DISORDER: Tobacco use disorder means you have a dependence on tobacco. Continuing to smoke can reduce the effectiveness of your medication. It is advised that you quit smoking to improve your overall health and the efficacy of your treatment. Your family supports you in this effort.  INSTRUCTIONS: Please follow up with the chest CT, echocardiogram, and liver function tests as discussed. Continue taking your medication as prescribed and try to reduce or quit smoking. Your next cardiology appointment is scheduled for November or December.                      Contains text generated by Abridge.                                 Contains text generated by Abridge.

## 2024-04-23 ENCOUNTER — Telehealth: Payer: Self-pay

## 2024-04-23 ENCOUNTER — Ambulatory Visit: Payer: Self-pay | Admitting: Pulmonary Disease

## 2024-04-23 NOTE — Telephone Encounter (Signed)
 Received message from Dr. Theophilus via epic secure chat- She has a high-res CT scheduled for tomorrow. Can we cancel it as she got a scan just last month and we do not need to repeat it so soon. Thank you   Spoke to patient via telephone. She is aware that CT is not needed.  She is requesting CT results.  Dr. Theophilus, please advise.   PCC's, please cancel CT.

## 2024-04-23 NOTE — Telephone Encounter (Signed)
 Per Dr. Theophilus via epic secure chat: Please let her know lung fibrosis is stable. Continue current therapy. Thank you  Patient is aware of results and voiced her understanding.  Nothing further needed.

## 2024-04-24 ENCOUNTER — Other Ambulatory Visit

## 2024-04-27 ENCOUNTER — Other Ambulatory Visit: Payer: Self-pay | Admitting: Cardiovascular Disease

## 2024-04-27 DIAGNOSIS — E785 Hyperlipidemia, unspecified: Secondary | ICD-10-CM

## 2024-05-05 ENCOUNTER — Other Ambulatory Visit: Payer: Self-pay | Admitting: Family Medicine

## 2024-05-05 DIAGNOSIS — I10 Essential (primary) hypertension: Secondary | ICD-10-CM

## 2024-05-13 ENCOUNTER — Other Ambulatory Visit: Payer: Self-pay

## 2024-05-13 ENCOUNTER — Encounter (HOSPITAL_BASED_OUTPATIENT_CLINIC_OR_DEPARTMENT_OTHER): Payer: Self-pay | Admitting: Emergency Medicine

## 2024-05-13 ENCOUNTER — Emergency Department (HOSPITAL_BASED_OUTPATIENT_CLINIC_OR_DEPARTMENT_OTHER)

## 2024-05-13 ENCOUNTER — Emergency Department (HOSPITAL_BASED_OUTPATIENT_CLINIC_OR_DEPARTMENT_OTHER)
Admission: EM | Admit: 2024-05-13 | Discharge: 2024-05-13 | Disposition: A | Discharge status: Home / Self Care | Attending: Emergency Medicine | Admitting: Emergency Medicine

## 2024-05-13 DIAGNOSIS — Z7982 Long term (current) use of aspirin: Secondary | ICD-10-CM | POA: Diagnosis not present

## 2024-05-13 DIAGNOSIS — J449 Chronic obstructive pulmonary disease, unspecified: Secondary | ICD-10-CM | POA: Diagnosis not present

## 2024-05-13 DIAGNOSIS — F17210 Nicotine dependence, cigarettes, uncomplicated: Secondary | ICD-10-CM | POA: Diagnosis not present

## 2024-05-13 DIAGNOSIS — J441 Chronic obstructive pulmonary disease with (acute) exacerbation: Secondary | ICD-10-CM | POA: Diagnosis not present

## 2024-05-13 DIAGNOSIS — R059 Cough, unspecified: Secondary | ICD-10-CM | POA: Diagnosis not present

## 2024-05-13 DIAGNOSIS — Z7951 Long term (current) use of inhaled steroids: Secondary | ICD-10-CM | POA: Insufficient documentation

## 2024-05-13 DIAGNOSIS — J8410 Pulmonary fibrosis, unspecified: Secondary | ICD-10-CM | POA: Insufficient documentation

## 2024-05-13 DIAGNOSIS — R0602 Shortness of breath: Secondary | ICD-10-CM | POA: Diagnosis not present

## 2024-05-13 DIAGNOSIS — I700 Atherosclerosis of aorta: Secondary | ICD-10-CM | POA: Diagnosis not present

## 2024-05-13 LAB — RESP PANEL BY RT-PCR (RSV, FLU A&B, COVID)  RVPGX2
Influenza A by PCR: NEGATIVE
Influenza B by PCR: NEGATIVE
Resp Syncytial Virus by PCR: NEGATIVE
SARS Coronavirus 2 by RT PCR: NEGATIVE

## 2024-05-13 LAB — CBC
HCT: 32.4 % — ABNORMAL LOW (ref 36.0–46.0)
Hemoglobin: 10.2 g/dL — ABNORMAL LOW (ref 12.0–15.0)
MCH: 29.2 pg (ref 26.0–34.0)
MCHC: 31.5 g/dL (ref 30.0–36.0)
MCV: 92.8 fL (ref 80.0–100.0)
Platelets: 189 K/uL (ref 150–400)
RBC: 3.49 MIL/uL — ABNORMAL LOW (ref 3.87–5.11)
RDW: 15.1 % (ref 11.5–15.5)
WBC: 5.1 K/uL (ref 4.0–10.5)
nRBC: 0 % (ref 0.0–0.2)

## 2024-05-13 LAB — BASIC METABOLIC PANEL WITH GFR
Anion gap: 11 (ref 5–15)
BUN: 15 mg/dL (ref 8–23)
CO2: 24 mmol/L (ref 22–32)
Calcium: 9 mg/dL (ref 8.9–10.3)
Chloride: 106 mmol/L (ref 98–111)
Creatinine, Ser: 1.2 mg/dL — ABNORMAL HIGH (ref 0.44–1.00)
GFR, Estimated: 47 mL/min — ABNORMAL LOW (ref >60)
Glucose, Bld: 121 mg/dL — ABNORMAL HIGH (ref 70–99)
Potassium: 4.1 mmol/L (ref 3.5–5.1)
Sodium: 141 mmol/L (ref 135–145)

## 2024-05-13 MED ORDER — PREDNISONE 10 MG PO TABS: 40.0000 mg | ORAL_TABLET | Freq: Every day | ORAL | 0 refills | Status: AC

## 2024-05-13 MED ORDER — AZITHROMYCIN 250 MG PO TABS: 250.0000 mg | ORAL_TABLET | Freq: Every day | ORAL | 0 refills | Status: DC

## 2024-05-13 NOTE — ED Notes (Signed)
 ED Provider at bedside.

## 2024-05-13 NOTE — Discharge Instructions (Signed)
 Trial of taking the prednisone  and the azithromycin  antibiotic for the next few days.  Make an appointment to follow back up with pulmonary medicine.  Return for any new or worse symptoms.  Chest x-ray here without any significant changes and testing for COVID flu and RSV was negative.  Turn for any new or worse symptoms

## 2024-05-13 NOTE — ED Provider Notes (Addendum)
 [place] [facility] Provider Note   CSN: [id number] Arrival date & time: 05/13/24  1636     Patient presents with: Cough   Christine Meyer is a 73 y.o. female.   Patient followed by [facility] pulmonary medicine.  They last saw her on August 11.  Patient known to have pulmonary fibrosis and centrilobular emphysema.  Some component of COPD.  Patient is on for pirfenidone  for the pulmonary fibrosis.  It looks like was seen in June 10 by Dr. Neysa pulmonary medicine for an exacerbation and at that time they kind of treated that with prednisone  and a Z-Pak.  Patient states that she has been feeling short of breath.  Oxygen saturation on room air is 100%.  Patient does not use oxygen at home.  She does have inhalers.  Says are not helping much.  Brought in by family member patient has a cardiac history with stent.  Patient current smoker approximately 1 cigarette/day.  Frequently has the complaint of some baseline shortness of breath.  Intermittent shortness of breath usually symptoms are relieved at rest.  Appetite decreased I think that secondary maybe to the medicine.       Prior to Admission medications   Medication Sig Start Date End Date Taking? Authorizing Provider  ACCU-CHEK SOFTCLIX LANCETS lancets Check blood sugar once daily. Dx:E11.9 04/01/15   Antonio Cyndee Jamee JONELLE, DO  aspirin  EC 81 MG tablet Take 1 tablet (81 mg total) by mouth daily. 10/05/20   Henry Manuelita NOVAK, NP  atorvastatin  (LIPITOR ) 80 MG tablet TAKE 1 TABLET BY MOUTH ONCE  DAILY 04/29/24   Court Dorn PARAS, MD  azithromycin  (ZITHROMAX ) 250 MG tablet 2 today then one daily- antibiotic 02/18/24   Neysa Rama D, MD  Blood Glucose Monitoring Suppl (ONETOUCH VERIO) w/Device KIT Use daily to check blood sugar.   DX E11.9 01/27/19   Antonio Cyndee Jamee R, DO  Calcium  500-100 MG-UNIT CHEW Chew 1 tablet by mouth daily before breakfast. 01/21/20   Antonio Cyndee, Jamee JONELLE, DO  Cholecalciferol   (VITAMIN D3) 50 MCG (2000 UT) capsule Take 1 capsule (2,000 Units total) by mouth daily. 02/04/24   Antonio Cyndee Jamee JONELLE, DO  clopidogrel  (PLAVIX ) 75 MG tablet TAKE 1 TABLET BY MOUTH DAILY 01/03/24   Antonio Cyndee, Yvonne R, DO  cyanocobalamin  (VITAMIN B12) 1000 MCG/ML injection Inject 1 mL (1,000 mcg total) into the muscle every 30 (thirty) days. 10/07/23   Antonio Cyndee Jamee JONELLE, DO  cyanocobalamin  (VITAMIN B12) 1000 MCG/ML injection Inject 1 mL (1,000 mcg total) into the skin every 30 (thirty) days. 02/04/24   Antonio Cyndee Jamee JONELLE, DO  FEROSUL 325 (65 Fe) MG tablet TAKE 1 TABLET BY MOUTH DAILY 07/09/22   Antonio Cyndee, Yvonne R, DO  glucose blood [facility] VERIO) test strip USE AS DIRECTED DAILY 04/29/20   Antonio Cyndee, Yvonne R, DO  guaiFENesin  (MUCINEX ) 600 MG 12 hr tablet Take 1 tablet (600 mg total) by mouth 2 (two) times daily. 06/08/21   Mannam, Praveen, MD  INSULIN  SYRINGE .5CC/29G 29G X 1/2 0.5 ML MISC Inject 1 each into the muscle every 30 (thirty) days. 10/07/23   Antonio Cyndee Jamee JONELLE, DO  loratadine  (CLARITIN ) 10 MG tablet Take 1 tablet (10 mg total) by mouth daily. 01/06/24   Antonio Cyndee Jamee JONELLE, DO  metoprolol  succinate (TOPROL -XL) 25 MG 24 hr tablet Take 1 tablet (25 mg total) by mouth daily. Take with or immediately following a meal 05/06/24   Lowne  Cyndee Rockers R, DO  mirtazapine  (REMERON ) 7.5 MG tablet Take 1 tablet (7.5 mg total) by mouth at bedtime. 10/07/23   Antonio Cyndee Rockers JONELLE, DO  Multiple Vitamins-Minerals (A THRU Z SELECT 50+ ADVANCED) TABS TAKE 1 TABLET BY MOUTH DAILY Patient taking differently: Take 1 tablet by mouth daily. 05/17/16   Antonio Cyndee Rockers R, DO  olmesartan  (BENICAR ) 20 MG tablet TAKE 1 TABLET BY MOUTH DAILY 11/06/23   Antonio Cyndee, Yvonne R, DO  pantoprazole  (PROTONIX ) 40 MG tablet TAKE 1 TABLET(40 MG) BY MOUTH DAILY BEFORE BREAKFAST 02/19/24   Avram Lupita BRAVO, MD  Pirfenidone  801 MG TABS TAKE 1 TABLET (801MG ) BY MOUTH  THREE TIMES DAILY WITH FOOD 02/04/24   Mannam,  Praveen, MD  predniSONE  (DELTASONE ) 20 MG tablet 1 each morning till gone 02/18/24   Young, Reggy D, MD  promethazine -dextromethorphan  (PROMETHAZINE -DM) 6.25-15 MG/5ML syrup Take 5 mLs by mouth 4 (four) times daily as needed. 02/04/24   Antonio Cyndee Rockers R, DO  spironolactone  (ALDACTONE ) 25 MG tablet TAKE 1 TABLET BY MOUTH DAILY 11/06/23   Antonio Cyndee, Yvonne R, DO  STIOLTO RESPIMAT  2.5-2.5 MCG/ACT AERS USE 2 INHALATIONS BY MOUTH DAILY 11/01/23   Mannam, Praveen, MD  sucralfate  (CARAFATE ) 1 g tablet Take 1 tablet (1 g total) by mouth 4 (four) times daily -  with meals and at bedtime. 10/05/22   Lowne Chase, Yvonne R, DO  SYRINGE-NEEDLE, DISP, 3 ML (BD INTEGRA SYRINGE) 25G X 1 3 ML MISC Use to inject b12 once a month. 07/22/23   Lowne Chase, Yvonne R, DO  Tiotropium Bromide -Olodaterol (STIOLTO RESPIMAT ) 2.5-2.5 MCG/ACT AERS Inhale 2 puffs into the lungs daily. 10/31/23 10/30/24  Mannam, Praveen, MD  VENTOLIN  HFA 108 (90 Base) MCG/ACT inhaler INHALE 1 TO 2 PUFFS INTO THE LUNGS EVERY 6 HOURS AS NEEDED FOR WHEEZING OR SHORTNESS OF BREATH 04/13/24   Mannam, Praveen, MD    Allergies: Patient has no known allergies.    Review of Systems  Constitutional:  Negative for chills and fever.  HENT:  Negative for ear pain and sore throat.   Eyes:  Negative for pain and visual disturbance.  Respiratory:  Positive for shortness of breath. Negative for cough.   Cardiovascular:  Negative for chest pain and palpitations.  Gastrointestinal:  Negative for abdominal pain and vomiting.  Genitourinary:  Negative for dysuria and hematuria.  Musculoskeletal:  Negative for arthralgias and back pain.  Skin:  Negative for color change and rash.  Neurological:  Negative for seizures and syncope.  All other systems reviewed and are negative.   Updated Vital Signs BP (!) 130/58 (BP Location: Left Arm)   Pulse 68   Temp 97.8 F (36.6 C)   Resp 20   SpO2 100%   Physical Exam Vitals and nursing note reviewed.   Constitutional:      General: She is not in acute distress.    Appearance: Normal appearance. She is well-developed. She is not ill-appearing.  HENT:     Head: Normocephalic and atraumatic.  Eyes:     Extraocular Movements: Extraocular movements intact.     Conjunctiva/sclera: Conjunctivae normal.     Pupils: Pupils are equal, round, and reactive to light.  Cardiovascular:     Rate and Rhythm: Normal rate and regular rhythm.     Heart sounds: No murmur heard. Pulmonary:     Effort: Pulmonary effort is normal. No respiratory distress.     Breath sounds: Normal breath sounds. No stridor. No wheezing, rhonchi or rales.  Abdominal:     Palpations: Abdomen is soft.     Tenderness: There is no abdominal tenderness.  Musculoskeletal:        General: No swelling.     Cervical back: Normal range of motion and neck supple.  Skin:    General: Skin is warm and dry.     Capillary Refill: Capillary refill takes less than 2 seconds.  Neurological:     General: No focal deficit present.     Mental Status: She is alert and oriented to person, place, and time.  Psychiatric:        Mood and Affect: Mood normal.     (all labs ordered are listed, but only abnormal results are displayed) Labs Reviewed  BASIC METABOLIC PANEL WITH GFR - Abnormal; Notable for the following components:      Result Value   Glucose, Bld 121 (*)    Creatinine, Ser 1.20 (*)    GFR, Estimated 47 (*)    All other components within normal limits  CBC - Abnormal; Notable for the following components:   RBC 3.49 (*)    Hemoglobin 10.2 (*)    HCT 32.4 (*)    All other components within normal limits  RESP PANEL BY RT-PCR (RSV, FLU A&B, COVID)  [id number]    EKG: EKG Interpretation Date/Time:  Wednesday May 13 2024 16:45:33 EDT Ventricular Rate:  69 PR Interval:  144 QRS Duration:  83 QT Interval:  382 QTC Calculation: 410 R Axis:   61  Text Interpretation: Sinus rhythm Confirmed by Yechiel Erny [phone])  on 05/13/2024 4:48:24 PM  Radiology: No results found.   Procedures   Medications Ordered in the ED - No data to display                                  Medical Decision Making Amount and/or Complexity of Data Reviewed Labs: ordered. Radiology: ordered.  Risk Prescription drug management.   Patient nontoxic no acute distress.  Oxygen levels on room air are very reassuring but patient states that she feels some increased air hunger.  CBC white count 5.1 hemoglobin 10.2 platelets 29 basic metabolic panel normal GFR 47 creatinine 1.20 BUN fine.  Potassium fine at 4.1.  Sensitive family was concerned about COVID.  So respiratory panel was done.  Two-view chest is pending.  EKG without any acute findings.  Respiratory panel negative for flu COVID RSV.  Also no significant change in the creatinine level.  Compared to August 11.  Chest x-ray stable fibrotic changes in lung bases no new focal lung infiltrate.  Will probably treat her the way pulmonary medicine did when she had an exacerbation with a Z-Pak and short course of prednisone .  Will discussed with patient.    Final diagnoses:  Pulmonary fibrosis (HCC)  Chronic obstructive pulmonary disease, unspecified COPD type [facility])    ED Discharge Orders     None          Geraldene Hamilton, MD 05/13/24 1739    Geraldene Hamilton, MD 05/13/24 [phone]

## 2024-05-13 NOTE — ED Triage Notes (Signed)
 Cough x 1 month , shortness of breath today . Denies chest pain .

## 2024-05-13 NOTE — ED Notes (Signed)
 Dc instructions given, pt verbalized understanding. Out of ED via WC not in visible distress.

## 2024-05-21 ENCOUNTER — Ambulatory Visit: Admitting: Family Medicine

## 2024-05-21 ENCOUNTER — Encounter: Payer: Self-pay | Admitting: Family Medicine

## 2024-05-21 VITALS — BP 138/40 | HR 73 | Ht 62.0 in | Wt 99.0 lb

## 2024-05-21 DIAGNOSIS — J441 Chronic obstructive pulmonary disease with (acute) exacerbation: Secondary | ICD-10-CM | POA: Diagnosis not present

## 2024-05-21 DIAGNOSIS — E785 Hyperlipidemia, unspecified: Secondary | ICD-10-CM | POA: Diagnosis not present

## 2024-05-21 DIAGNOSIS — J84112 Idiopathic pulmonary fibrosis: Secondary | ICD-10-CM

## 2024-05-21 DIAGNOSIS — J438 Other emphysema: Secondary | ICD-10-CM

## 2024-05-21 DIAGNOSIS — I10 Essential (primary) hypertension: Secondary | ICD-10-CM | POA: Diagnosis not present

## 2024-05-21 DIAGNOSIS — K921 Melena: Secondary | ICD-10-CM | POA: Diagnosis not present

## 2024-05-21 MED ORDER — IPRATROPIUM-ALBUTEROL 0.5-2.5 (3) MG/3ML IN SOLN: 3.0000 mL | Freq: Four times a day (QID) | RESPIRATORY_TRACT | 2 refills | Status: AC | PRN

## 2024-05-21 NOTE — Assessment & Plan Note (Signed)
 Improvement in breath sounds after duoneb

## 2024-05-21 NOTE — Assessment & Plan Note (Signed)
 Well controlled, no changes to meds. Encouraged heart healthy diet such as the DASH diet and exercise as tolerated.

## 2024-05-21 NOTE — Assessment & Plan Note (Signed)
 Per pulmonary Given duoneb tx with improvement of breath sounds Rx nebulizer  Rx duoneb

## 2024-05-21 NOTE — Assessment & Plan Note (Signed)
 Encourage heart healthy diet such as MIND or DASH diet, increase exercise, avoid trans fats, simple carbohydrates and processed foods, consider a krill or fish or flaxseed oil cap daily.

## 2024-05-21 NOTE — Progress Notes (Signed)
 Subjective:    Patient ID: Christine Meyer, female    DOB: 14-Jul-1951, 73 y.o.   MRN: [id number]  Chief Complaint  Patient presents with   Hospitalization Follow-up    Only concern is shortness of breathe when being active     HPI Patient is in today for f/u er.   History of Present Illness  Christine Meyer went to er with shortness of breath,  EKG , cxr and labs were done.   W/u neg.  Pt was given z pak and pred pak.  She states she is slightly better.     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   CAD (coronary artery disease)    Stent RCA 1999   Chronic kidney disease    COPD (chronic obstructive pulmonary disease) (HCC)    Diabetes mellitus, type 2 (HCC)    Diet controlled   Gastric polyps - hyperplastic 02/23/2018   Hyperlipidemia    Hypertension    OSA (obstructive sleep apnea) [name]    PVD (peripheral vascular disease) (HCC)    Carotid stenosis, renal artery stenosis   Sleep apnea    dx'd but doesn't use mask    Uterine cancer (HCC) ?8019'd    Past Surgical History:  Procedure Laterality Date   ABDOMINAL ANGIOGRAM  11/22/2014   Procedure: ABDOMINAL ANGIOGRAM;  Surgeon: Dorn JINNY Lesches, MD;  Location: [facility] CATH LAB;  Service: Cardiovascular;;   ABDOMINAL AORTOGRAM W/LOWER EXTREMITY N/A 10/03/2020   Procedure: ABDOMINAL AORTOGRAM W/LOWER EXTREMITY;  Surgeon: Lesches Dorn JINNY, MD;  Location: [facility] INVASIVE CV LAB;  Service: Cardiovascular;  Laterality: N/A;   COLONOSCOPY  2000   negative   CORONARY ANGIOPLASTY WITH STENT PLACEMENT  1999   RCA; Dr. Lavon G4 P4   ESOPHAGOGASTRODUODENOSCOPY     ILIAC ARTERY STENT Right 11/22/2014   LOWER EXTREMITY ANGIOGRAM N/A 11/22/2014   Procedure: LOWER EXTREMITY ANGIOGRAM;  Surgeon: Dorn JINNY Lesches, MD; L-SFA 100%, 3v runoff, R-pCIA 95>>0% w/ 8 mm x 18 mm long  Balloon expandable stent; R-dCIA 67>>0% w/ 8 mm x 4 cm long Cordis Smart  Nitinol self  expanding stent    LOWER EXTREMITY ANGIOGRAPHY Bilateral 12/08/2020   Procedure: Lower Extremity Angiography;  Surgeon: Lesches Dorn JINNY, MD;  Location: [facility] INVASIVE CV LAB;  Service: Cardiovascular;  Laterality: Bilateral;   PERIPHERAL VASCULAR ATHERECTOMY Left 12/08/2020   Procedure: PERIPHERAL VASCULAR ATHERECTOMY;  Surgeon: Lesches Dorn JINNY, MD;  Location: [facility] INVASIVE CV LAB;  Service: Cardiovascular;  Laterality: Left;  common iliac   PERIPHERAL VASCULAR INTERVENTION Left 12/08/2020   Procedure: PERIPHERAL VASCULAR INTERVENTION;  Surgeon: Lesches Dorn JINNY, MD;  Location: [facility] INVASIVE CV LAB;  Service: Cardiovascular;  Laterality: Left;  common iliac   RENAL ARTERY STENT Right May 2007; 08/2006   ; restenosis   TOTAL ABDOMINAL HYSTERECTOMY  ?8019'd    for uterine CA   VISCERAL ARTERY INTERVENTION N/A 10/03/2020   Procedure: VISCERAL ARTERY INTERVENTION;  Surgeon: Lesches Dorn JINNY, MD;  Location: [facility] INVASIVE CV LAB;  Service: Cardiovascular;  Laterality: N/A;  SMA    Family History  Problem Relation Age of Onset   Heart failure Mother        after hip fx.    Hypertension Mother    Diabetes Mother    Deep vein thrombosis Mother    Hypertension Father    Diabetes Sister  insulin  dependent   Hypertension Brother    Emphysema Brother    Diabetes Brother        insulin  dependent   Diabetes Maternal Grandmother        non-insulin  dependent   Stroke Daughter    Heart attack Daughter    Breast cancer Daughter    Brain cancer Daughter    Breast cancer Daughter    Diabetes Maternal Aunt        non-insulent dependent   Heart attack Paternal Aunt     Social History   Socioeconomic History   Marital status: Single    Spouse name: Not on file   Number of children: 4   Years of education: Not on file   Highest education level: Not on file  Occupational History   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    Employer: [employer]  Tobacco Use   Smoking status: Every Day    Current packs/day: 0.25    Average packs/day: 0.3 packs/day for 51.0 years (12.8 ttl pk-yrs)    Types: Cigarettes    Passive exposure: Current   Smokeless tobacco: Never   Tobacco comments:    1-2 cigarettes per day KD-10/31/23  Vaping Use   Vaping status: Never Used  Substance and Sexual Activity   Alcohol use: No    Alcohol/week: 0.0 standard drinks of alcohol   Drug use: No   Sexual activity: Not Currently  Other Topics Concern   Not on file  Social History Narrative      Daughter Christine Meyer is CMA Adult nurse primary care [facility]   Retired Information systems manager   Smoker, no EtOH/drugs   Social Drivers of Corporate investment banker Strain: Low Risk  (08/01/2023)   Overall Financial Resource Strain (CARDIA)    Difficulty of Paying Living Expenses: Not hard at all  Food Insecurity: No Food Insecurity (08/01/2023)   Hunger Vital Sign    Worried About Running Out of Food in the Last Year: Never true    Ran Out of Food in the Last Year: Never true  Transportation Needs: No Transportation Needs (08/01/2023)   PRAPARE - Administrator, Civil Service (Medical): No    Lack of Transportation (Non-Medical): No  Physical Activity: Inactive (08/01/2023)   Exercise Vital Sign    Days of Exercise per Week: 0 days    Minutes of Exercise per Session: 0 min  Stress: No Stress Concern Present (08/01/2023)   [employer] of Occupational Health - Occupational Stress Questionnaire    Feeling of Stress : Not at all  Social Connections: Moderately Integrated (08/01/2023)   Social Connection and Isolation Panel    Frequency of Communication with Friends and Family: More than three times a week    Frequency of Social Gatherings with Friends and Family: More than three times a week    Attends Religious Services: More than 4 times per year    Active Member of [employer] or Organizations: Yes    Attends Banker Meetings:  More than 4 times per year    Marital Status: Never married  Intimate Partner Violence: Not At Risk (08/01/2023)   Humiliation, Afraid, Rape, and Kick questionnaire    Fear of Current or Ex-Partner: No    Emotionally Abused: No    Physically Abused: No    Sexually Abused: No    Outpatient Medications Prior to Visit  Medication Sig Dispense Refill   ACCU-CHEK SOFTCLIX LANCETS lancets  Check blood sugar once daily. Dx:E11.9 100 each 5   aspirin  EC 81 MG tablet Take 1 tablet (81 mg total) by mouth daily. 90 tablet 1   atorvastatin  (LIPITOR ) 80 MG tablet TAKE 1 TABLET BY MOUTH ONCE  DAILY 100 tablet 0   azithromycin  (ZITHROMAX ) 250 MG tablet 2 today then one daily- antibiotic 6 tablet 0   azithromycin  (ZITHROMAX ) 250 MG tablet Take 1 tablet (250 mg total) by mouth daily. Take first 2 tablets together, then 1 every day until finished. 6 tablet 0   Blood Glucose Monitoring Suppl (ONETOUCH VERIO) w/Device KIT Use daily to check blood sugar.   DX E11.9 1 kit 0   Calcium  500-100 MG-UNIT CHEW Chew 1 tablet by mouth daily before breakfast. 150 tablet 1   Cholecalciferol  (VITAMIN D3) 50 MCG (2000 UT) capsule Take 1 capsule (2,000 Units total) by mouth daily. 100 capsule 11   clopidogrel  (PLAVIX ) 75 MG tablet TAKE 1 TABLET BY MOUTH DAILY 100 tablet 2   cyanocobalamin  (VITAMIN B12) 1000 MCG/ML injection Inject 1 mL (1,000 mcg total) into the muscle every 30 (thirty) days. 10 mL 3   cyanocobalamin  (VITAMIN B12) 1000 MCG/ML injection Inject 1 mL (1,000 mcg total) into the skin every 30 (thirty) days. 3 mL 3   FEROSUL 325 (65 Fe) MG tablet TAKE 1 TABLET BY MOUTH DAILY 30 tablet 5   glucose blood (ONETOUCH VERIO) test strip USE AS DIRECTED DAILY 100 strip 12   guaiFENesin  (MUCINEX ) 600 MG 12 hr tablet Take 1 tablet (600 mg total) by mouth 2 (two) times daily. 30 tablet 5   INSULIN  SYRINGE .5CC/29G 29G X 1/2 0.5 ML MISC Inject 1 each into the muscle every 30 (thirty) days. 100 each 1   loratadine  (CLARITIN )  10 MG tablet Take 1 tablet (10 mg total) by mouth daily. 100 tablet 11   metoprolol  succinate (TOPROL -XL) 25 MG 24 hr tablet Take 1 tablet (25 mg total) by mouth daily. Take with or immediately following a meal 90 tablet 1   mirtazapine  (REMERON ) 7.5 MG tablet Take 1 tablet (7.5 mg total) by mouth at bedtime. 90 tablet 1   Multiple Vitamins-Minerals (A THRU Z SELECT 50+ ADVANCED) TABS TAKE 1 TABLET BY MOUTH DAILY (Patient taking differently: Take 1 tablet by mouth daily.) 220 tablet 1   olmesartan  (BENICAR ) 20 MG tablet TAKE 1 TABLET BY MOUTH DAILY 100 tablet 2   pantoprazole  (PROTONIX ) 40 MG tablet TAKE 1 TABLET(40 MG) BY MOUTH DAILY BEFORE BREAKFAST 90 tablet 0   Pirfenidone  801 MG TABS TAKE 1 TABLET (801MG ) BY MOUTH  THREE TIMES DAILY WITH FOOD 270 tablet 1   predniSONE  (DELTASONE ) 10 MG tablet Take 4 tablets (40 mg total) by mouth daily. 20 tablet 0   predniSONE  (DELTASONE ) 20 MG tablet 1 each morning till gone 5 tablet 0   promethazine -dextromethorphan  (PROMETHAZINE -DM) 6.25-15 MG/5ML syrup Take 5 mLs by mouth 4 (four) times daily as needed. 118 mL 0   spironolactone  (ALDACTONE ) 25 MG tablet TAKE 1 TABLET BY MOUTH DAILY 100 tablet 2   STIOLTO RESPIMAT  2.5-2.5 MCG/ACT AERS USE 2 INHALATIONS BY MOUTH DAILY 12 g 3   sucralfate  (CARAFATE ) 1 g tablet Take 1 tablet (1 g total) by mouth 4 (four) times daily -  with meals and at bedtime. 120 tablet 1   SYRINGE-NEEDLE, DISP, 3 ML (BD INTEGRA SYRINGE) 25G X 1 3 ML MISC Use to inject b12 once a month. 12 each 0   Tiotropium Bromide -Olodaterol (STIOLTO RESPIMAT ) 2.5-2.5  MCG/ACT AERS Inhale 2 puffs into the lungs daily. 3 each 3   VENTOLIN  HFA 108 (90 Base) MCG/ACT inhaler INHALE 1 TO 2 PUFFS INTO THE LUNGS EVERY 6 HOURS AS NEEDED FOR WHEEZING OR SHORTNESS OF BREATH 18 g 11   No facility-administered medications prior to visit.    No Known Allergies  Review of Systems  Constitutional:  Negative for fever and malaise/fatigue.  HENT:  Negative for  congestion.   Eyes:  Negative for blurred vision.  Respiratory:  Positive for cough, shortness of breath and wheezing.   Cardiovascular:  Negative for chest pain, palpitations and leg swelling.  Gastrointestinal:  Negative for vomiting.  Musculoskeletal:  Negative for back pain.  Skin:  Negative for rash.  Neurological:  Negative for loss of consciousness and headaches.       Objective:    Physical Exam Vitals and nursing note reviewed.  Constitutional:      General: She is not in acute distress.    Appearance: Normal appearance. She is well-developed.  HENT:     Head: Normocephalic and atraumatic.  Eyes:     General: No scleral icterus.       Right eye: No discharge.        Left eye: No discharge.  Cardiovascular:     Rate and Rhythm: Normal rate and regular rhythm.     Heart sounds: No murmur heard. Pulmonary:     Effort: Pulmonary effort is normal. No respiratory distress.     Breath sounds: Decreased breath sounds and rales present.  Genitourinary:    Rectum: Guaiac result positive. No mass, tenderness, anal fissure, external hemorrhoid or internal hemorrhoid.  Musculoskeletal:        General: Normal range of motion.     Cervical back: Normal range of motion and neck supple.     Right lower leg: No edema.     Left lower leg: No edema.  Skin:    General: Skin is warm and dry.  Neurological:     Mental Status: She is alert and oriented to person, place, and time.  Psychiatric:        Mood and Affect: Mood normal.        Behavior: Behavior normal.        Thought Content: Thought content normal.        Judgment: Judgment normal.     BP (!) 138/40   Pulse 73   Ht 5' 2 (1.575 m)   Wt 99 lb (44.9 kg)   SpO2 97%   BMI 18.11 kg/m  Wt Readings from Last 3 Encounters:  05/21/24 99 lb (44.9 kg)  04/20/24 98 lb (44.5 kg)  02/18/24 97 lb (44 kg)    Diabetic Foot Exam - Simple   No data filed    Lab Results  Component Value Date   WBC 5.1 05/13/2024   HGB  10.2 (L) 05/13/2024   HCT 32.4 (L) 05/13/2024   PLT 189 05/13/2024   GLUCOSE 121 (H) 05/13/2024   CHOL 142 01/06/2024   TRIG 80.0 01/06/2024   HDL 54.10 01/06/2024   LDLCALC 72 01/06/2024   ALT 16 04/20/2024   AST 18 04/20/2024   NA 141 05/13/2024   K 4.1 05/13/2024   CL 106 05/13/2024   CREATININE 1.20 (H) 05/13/2024   BUN 15 05/13/2024   CO2 24 05/13/2024   TSH 3.21 01/06/2024   INR 0.95 11/11/2014   HGBA1C 6.2 01/06/2024   MICROALBUR 0.2 02/16/2010    Lab Results  Component Value Date   TSH 3.21 01/06/2024   Lab Results  Component Value Date   WBC 5.1 05/13/2024   HGB 10.2 (L) 05/13/2024   HCT 32.4 (L) 05/13/2024   MCV 92.8 05/13/2024   PLT 189 05/13/2024   Lab Results  Component Value Date   NA 141 05/13/2024   K 4.1 05/13/2024   CO2 24 05/13/2024   GLUCOSE 121 (H) 05/13/2024   BUN 15 05/13/2024   CREATININE 1.20 (H) 05/13/2024   BILITOT 0.5 04/20/2024   ALKPHOS 70 04/20/2024   AST 18 04/20/2024   ALT 16 04/20/2024   PROT 7.1 04/20/2024   ALBUMIN 4.2 04/20/2024   CALCIUM  9.0 05/13/2024   ANIONGAP 11 05/13/2024   EGFR 47 (L) 05/16/2021   GFR 43.97 (L) 04/20/2024   Lab Results  Component Value Date   CHOL 142 01/06/2024   Lab Results  Component Value Date   HDL 54.10 01/06/2024   Lab Results  Component Value Date   LDLCALC 72 01/06/2024   Lab Results  Component Value Date   TRIG 80.0 01/06/2024   Lab Results  Component Value Date   CHOLHDL 3 01/06/2024   Lab Results  Component Value Date   HGBA1C 6.2 01/06/2024       Assessment & Plan:  Black stool -     CBC with Differential/Platelet; Future -     Comprehensive metabolic panel with GFR; Future -     POC Hemoccult Bld/Stl (1-Cd Office Dx)  Other emphysema (HCC) -     For home use only DME Nebulizer machine  COPD with acute exacerbation (HCC) Assessment & Plan: Improvement in breath sounds after duoneb  Orders: -     For home use only DME Nebulizer machine -      Ipratropium-Albuterol ; Take 3 mLs by nebulization every 6 (six) hours as needed.  Dispense: 360 mL; Refill: 2  IPF (idiopathic pulmonary fibrosis) (HCC) Assessment & Plan: Per pulmonary Given duoneb tx with improvement of breath sounds Rx nebulizer  Rx duoneb     Hyperlipidemia LDL goal <70 Assessment & Plan: Encourage heart healthy diet such as MIND or DASH diet, increase exercise, avoid trans fats, simple carbohydrates and processed foods, consider a krill or fish or flaxseed oil cap daily.     Essential hypertension Assessment & Plan: Well controlled, no changes to meds. Encouraged heart healthy diet such as the DASH diet and exercise as tolerated.      Assessment and Plan  Jamee JONELLE Antonio Cyndee, DO

## 2024-05-22 ENCOUNTER — Other Ambulatory Visit (INDEPENDENT_AMBULATORY_CARE_PROVIDER_SITE_OTHER)

## 2024-05-22 DIAGNOSIS — K921 Melena: Secondary | ICD-10-CM

## 2024-05-23 DIAGNOSIS — J441 Chronic obstructive pulmonary disease with (acute) exacerbation: Secondary | ICD-10-CM | POA: Diagnosis not present

## 2024-05-23 LAB — COMPREHENSIVE METABOLIC PANEL WITH GFR
AG Ratio: 1.8 (calc) (ref 1.0–2.5)
ALT: 23 U/L (ref 6–29)
AST: 17 U/L (ref 10–35)
Albumin: 3.5 g/dL — ABNORMAL LOW (ref 3.6–5.1)
Alkaline phosphatase (APISO): 58 U/L (ref 37–153)
BUN/Creatinine Ratio: 15 (calc) (ref 6–22)
BUN: 16 mg/dL (ref 7–25)
CO2: 23 mmol/L (ref 20–32)
Calcium: 8.7 mg/dL (ref 8.6–10.4)
Chloride: 107 mmol/L (ref 98–110)
Creat: 1.09 mg/dL — ABNORMAL HIGH (ref 0.60–1.00)
Globulin: 2 g/dL (ref 1.9–3.7)
Glucose, Bld: 98 mg/dL (ref 65–99)
Potassium: 4.3 mmol/L (ref 3.5–5.3)
Sodium: 140 mmol/L (ref 135–146)
Total Bilirubin: 0.3 mg/dL (ref 0.2–1.2)
Total Protein: 5.5 g/dL — ABNORMAL LOW (ref 6.1–8.1)
eGFR: 53 mL/min/1.73m2 — ABNORMAL LOW (ref >=60)

## 2024-05-23 LAB — CBC WITH DIFFERENTIAL/PLATELET
Absolute Lymphocytes: 2178 {cells}/uL (ref 850–3900)
Absolute Monocytes: 520 {cells}/uL (ref 200–950)
Basophils Absolute: 13 {cells}/uL (ref 0–200)
Basophils Relative: 0.2 %
Eosinophils Absolute: 306 {cells}/uL (ref 15–500)
Eosinophils Relative: 4.7 %
HCT: 31.8 % — ABNORMAL LOW (ref 35.0–45.0)
Hemoglobin: 10.1 g/dL — ABNORMAL LOW (ref 11.7–15.5)
MCH: 29.4 pg (ref 27.0–33.0)
MCHC: 31.8 g/dL — ABNORMAL LOW (ref 32.0–36.0)
MCV: 92.4 fL (ref 80.0–100.0)
MPV: 10 fL (ref 7.5–12.5)
Monocytes Relative: 8 %
Neutro Abs: 3484 {cells}/uL (ref 1500–7800)
Neutrophils Relative %: 53.6 %
Platelets: 216 Thousand/uL (ref 140–400)
RBC: 3.44 Million/uL — ABNORMAL LOW (ref 3.80–5.10)
RDW: 14.3 % (ref 11.0–15.0)
Total Lymphocyte: 33.5 %
WBC: 6.5 Thousand/uL (ref 3.8–10.8)

## 2024-05-29 ENCOUNTER — Other Ambulatory Visit: Payer: Self-pay | Admitting: Family Medicine

## 2024-05-29 ENCOUNTER — Ambulatory Visit: Payer: Self-pay | Admitting: Family Medicine

## 2024-05-29 DIAGNOSIS — D649 Anemia, unspecified: Secondary | ICD-10-CM

## 2024-06-01 ENCOUNTER — Ambulatory Visit (HOSPITAL_COMMUNITY)
Admission: RE | Admit: 2024-06-01 | Discharge: 2024-06-01 | Disposition: A | Discharge status: Home / Self Care | Source: Ambulatory Visit | Attending: Cardiology | Admitting: Cardiology

## 2024-06-01 DIAGNOSIS — J84112 Idiopathic pulmonary fibrosis: Secondary | ICD-10-CM | POA: Insufficient documentation

## 2024-06-01 DIAGNOSIS — R0600 Dyspnea, unspecified: Secondary | ICD-10-CM | POA: Insufficient documentation

## 2024-06-01 DIAGNOSIS — Z5181 Encounter for therapeutic drug level monitoring: Secondary | ICD-10-CM | POA: Diagnosis not present

## 2024-06-01 LAB — ECHOCARDIOGRAM COMPLETE: S' Lateral: 2.35 cm

## 2024-06-10 ENCOUNTER — Encounter (HOSPITAL_COMMUNITY): Payer: Self-pay

## 2024-06-16 LAB — POC HEMOCCULT BLD/STL (OFFICE/1-CARD/DIAGNOSTIC): Fecal Occult Blood, POC: NEGATIVE

## 2024-06-22 ENCOUNTER — Other Ambulatory Visit: Payer: Self-pay | Admitting: Cardiovascular Disease

## 2024-06-22 DIAGNOSIS — J441 Chronic obstructive pulmonary disease with (acute) exacerbation: Secondary | ICD-10-CM | POA: Diagnosis not present

## 2024-06-22 DIAGNOSIS — Z95828 Presence of other vascular implants and grafts: Secondary | ICD-10-CM

## 2024-07-08 ENCOUNTER — Other Ambulatory Visit: Payer: Self-pay | Admitting: Family Medicine

## 2024-07-08 DIAGNOSIS — I10 Essential (primary) hypertension: Secondary | ICD-10-CM

## 2024-07-17 ENCOUNTER — Ambulatory Visit: Payer: Self-pay | Admitting: Cardiovascular Disease

## 2024-07-17 ENCOUNTER — Ambulatory Visit (HOSPITAL_COMMUNITY)
Admission: RE | Admit: 2024-07-17 | Discharge: 2024-07-17 | Disposition: A | Discharge status: Home / Self Care | Source: Ambulatory Visit | Attending: Cardiovascular Disease | Admitting: Cardiovascular Disease

## 2024-07-17 ENCOUNTER — Ambulatory Visit (HOSPITAL_BASED_OUTPATIENT_CLINIC_OR_DEPARTMENT_OTHER)
Admission: RE | Admit: 2024-07-17 | Discharge: 2024-07-17 | Disposition: A | Discharge status: Home / Self Care | Source: Ambulatory Visit | Attending: Cardiovascular Disease | Admitting: Cardiovascular Disease

## 2024-07-17 DIAGNOSIS — I10 Essential (primary) hypertension: Secondary | ICD-10-CM | POA: Insufficient documentation

## 2024-07-17 DIAGNOSIS — I6523 Occlusion and stenosis of bilateral carotid arteries: Secondary | ICD-10-CM | POA: Diagnosis present

## 2024-07-17 DIAGNOSIS — Z95828 Presence of other vascular implants and grafts: Secondary | ICD-10-CM

## 2024-07-18 LAB — VAS US ABI WITH/WO TBI
Left ABI: 0.53
Right ABI: 0.52

## 2024-07-21 ENCOUNTER — Encounter: Payer: Self-pay | Admitting: Family Medicine

## 2024-07-21 ENCOUNTER — Other Ambulatory Visit (HOSPITAL_BASED_OUTPATIENT_CLINIC_OR_DEPARTMENT_OTHER): Payer: Self-pay

## 2024-08-08 ENCOUNTER — Other Ambulatory Visit: Payer: Self-pay | Admitting: Cardiovascular Disease

## 2024-08-08 ENCOUNTER — Other Ambulatory Visit: Payer: Self-pay | Admitting: Family Medicine

## 2024-08-08 DIAGNOSIS — I10 Essential (primary) hypertension: Secondary | ICD-10-CM

## 2024-08-08 DIAGNOSIS — E785 Hyperlipidemia, unspecified: Secondary | ICD-10-CM

## 2024-08-10 ENCOUNTER — Encounter: Payer: Self-pay | Admitting: Cardiovascular Disease

## 2024-08-10 ENCOUNTER — Ambulatory Visit: Attending: Cardiovascular Disease | Admitting: Cardiovascular Disease

## 2024-08-10 VITALS — BP 125/68 | HR 68 | Ht 61.0 in | Wt 98.0 lb

## 2024-08-10 DIAGNOSIS — I701 Atherosclerosis of renal artery: Secondary | ICD-10-CM

## 2024-08-10 DIAGNOSIS — I6523 Occlusion and stenosis of bilateral carotid arteries: Secondary | ICD-10-CM | POA: Diagnosis not present

## 2024-08-10 DIAGNOSIS — E785 Hyperlipidemia, unspecified: Secondary | ICD-10-CM | POA: Diagnosis not present

## 2024-08-10 DIAGNOSIS — F17200 Nicotine dependence, unspecified, uncomplicated: Secondary | ICD-10-CM

## 2024-08-10 DIAGNOSIS — K559 Vascular disorder of intestine, unspecified: Secondary | ICD-10-CM

## 2024-08-10 DIAGNOSIS — I739 Peripheral vascular disease, unspecified: Secondary | ICD-10-CM

## 2024-08-10 DIAGNOSIS — I2510 Atherosclerotic heart disease of native coronary artery without angina pectoris: Secondary | ICD-10-CM

## 2024-08-10 DIAGNOSIS — Z95828 Presence of other vascular implants and grafts: Secondary | ICD-10-CM

## 2024-08-10 MED ORDER — ATORVASTATIN CALCIUM 80 MG PO TABS: 80.0000 mg | ORAL_TABLET | Freq: Every day | ORAL | 3 refills | Status: AC

## 2024-08-10 NOTE — Assessment & Plan Note (Signed)
 History of essential hypertension blood pressure measured today 125/68.  She is on metoprolol  and Benicar .

## 2024-08-10 NOTE — Assessment & Plan Note (Signed)
 History of dyslipidemia on high-dose atorvastatin  with lipid profile performed 01/06/24 revealing total cholesterol of 142, LDL 72 and HDL 54.

## 2024-08-10 NOTE — Progress Notes (Signed)
 08/10/2024 Christine Meyer   08/02/1951  [id number]  Primary Physician Antonio Cyndee Jamee JONELLE, DO Primary Cardiologist: Dorn JINNY Lesches MD [facility] [place], [place]  HPI:  Christine Meyer is a 73 y.o.  thin appearing single African-American female mother of 1 living child (3 deceased), grandmother to 7 grandchildren. She was referred by Dr. Lavona , her cardiologist,  for peripheral vascular evaluation. I last saw her in the office 07/29/2023 unfortunately, her daughter Dorian who came with her last year passed away 07/16/23 of cancer.  She has a history of myocardial infarction back in 1999 undergoing stenting of her RCA by Dr. Lavon.her chronic risk factors are notable for continued tobacco abuse, treated hypertension and hyperlipidemia. She is complained of increasing dyspnea on exertion over the last 6 months as well as right calf claudication. Dr. Lavona saw her  and ordered a stress test. Doppler showed an ankle-brachial index of 0.4 on both sides, and occluded left SFA with In-Flow disease. She has had right renal artery stenting in the past as well as right external iliac artery stenting as well. 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 I stented both of these areas. She did have an occluded left SFA with high-grade segmental diffuse right SFA stenosis. Her Dopplers and symptoms improved.   She had been complaining of some abdominal pain and is experiencing some weight loss.  She saw Dr. Avram, her gastroenterologist who obtained an abdominal CTA and referred her to Dr. Magda , vascular surgeon for consideration of peripheral angiography and treatment of mesenteric ischemia.  She had carotid Dopplers that showed moderate left ICA stenosis and lower extremity Dopplers performed today that suggested in-stent restenosis within her right iliac  stent.   I performed angiography on her 10/03/2020 revealing patent right iliac stent, total SFAs bilaterally, 90% calcified proximal left common iliac artery stenosis, subtotally occluded celiac axis at the origin and 90% SMA which I stented successfully.  She was discharged home the following day.  She did have a slight drop in her hemoglobin but no evidence of hematoma or retroperitoneal bleed.  Her follow-up Dopplers performed a week later showed a widely patent SMA.  Her abdominal pain resolved after the intervention and she had dinner in the hospital that evening without discomfort.  She states since gained 5 pounds.  She does complain of left lower extremity claudication however wishes to have this intervened on in the near future.   Since I saw her in the office a year ago she continues to do well.  She now smokes 1 cigarette a day.  Her Dopplers show bilateral internal carotid artery stenosis as well as progression of disease in her mesenteric arteries.  She currently denies chest pain, shortness of breath or claudication.  She also denies abdominal pain.   Current Meds  Medication Sig   ACCU-CHEK SOFTCLIX LANCETS lancets Check blood sugar once daily. Dx:E11.9   aspirin  EC 81 MG tablet Take 1 tablet (81 mg total) by mouth daily.   azithromycin  (ZITHROMAX ) 250 MG tablet 2 today then one daily- antibiotic   azithromycin  (ZITHROMAX ) 250 MG tablet Take 1 tablet (250 mg total) by mouth daily. Take first 2 tablets together, then 1 every day until finished.   Blood Glucose Monitoring Suppl (ONETOUCH VERIO) w/Device KIT Use daily to check blood sugar.   DX E11.9   Calcium  500-100  MG-UNIT CHEW Chew 1 tablet by mouth daily before breakfast.   Cholecalciferol  (VITAMIN D3) 50 MCG (2000 UT) capsule Take 1 capsule (2,000 Units total) by mouth daily.   clopidogrel  (PLAVIX ) 75 MG tablet TAKE 1 TABLET BY MOUTH DAILY   cyanocobalamin  (VITAMIN B12) 1000 MCG/ML injection Inject 1 mL (1,000 mcg total) into the  muscle every 30 (thirty) days.   cyanocobalamin  (VITAMIN B12) 1000 MCG/ML injection Inject 1 mL (1,000 mcg total) into the skin every 30 (thirty) days.   FEROSUL 325 (65 Fe) MG tablet TAKE 1 TABLET BY MOUTH DAILY   glucose blood (ONETOUCH VERIO) test strip USE AS DIRECTED DAILY   guaiFENesin  (MUCINEX ) 600 MG 12 hr tablet Take 1 tablet (600 mg total) by mouth 2 (two) times daily.   INSULIN  SYRINGE .5CC/29G 29G X 1/2 0.5 ML MISC Inject 1 each into the muscle every 30 (thirty) days.   ipratropium-albuterol  (DUONEB) 0.5-2.5 (3) MG/3ML SOLN Take 3 mLs by nebulization every 6 (six) hours as needed.   loratadine  (CLARITIN ) 10 MG tablet Take 1 tablet (10 mg total) by mouth daily.   metoprolol  succinate (TOPROL -XL) 25 MG 24 hr tablet Take 1 tablet (25 mg total) by mouth daily. Take with or immediately following a meal   mirtazapine  (REMERON ) 7.5 MG tablet Take 1 tablet (7.5 mg total) by mouth at bedtime.   Multiple Vitamins-Minerals (A THRU Z SELECT 50+ ADVANCED) TABS TAKE 1 TABLET BY MOUTH DAILY (Patient taking differently: Take 1 tablet by mouth daily.)   olmesartan  (BENICAR ) 20 MG tablet Take 1 tablet (20 mg total) by mouth daily.   pantoprazole  (PROTONIX ) 40 MG tablet TAKE 1 TABLET(40 MG) BY MOUTH DAILY BEFORE BREAKFAST   Pirfenidone  801 MG TABS TAKE 1 TABLET (801MG ) BY MOUTH  THREE TIMES DAILY WITH FOOD   predniSONE  (DELTASONE ) 10 MG tablet Take 4 tablets (40 mg total) by mouth daily.   predniSONE  (DELTASONE ) 20 MG tablet 1 each morning till gone   promethazine -dextromethorphan  (PROMETHAZINE -DM) 6.25-15 MG/5ML syrup Take 5 mLs by mouth 4 (four) times daily as needed.   spironolactone  (ALDACTONE ) 25 MG tablet Take 1 tablet (25 mg total) by mouth daily.   STIOLTO RESPIMAT  2.5-2.5 MCG/ACT AERS USE 2 INHALATIONS BY MOUTH DAILY   sucralfate  (CARAFATE ) 1 g tablet Take 1 tablet (1 g total) by mouth 4 (four) times daily -  with meals and at bedtime.   SYRINGE-NEEDLE, DISP, 3 ML (BD INTEGRA SYRINGE) 25G X 1  3 ML MISC Use to inject b12 once a month.   Tiotropium Bromide -Olodaterol (STIOLTO RESPIMAT ) 2.5-2.5 MCG/ACT AERS Inhale 2 puffs into the lungs daily.   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    Employer: [employer]  Tobacco Use   Smoking status: Every Day    Current packs/day: 0.25    Average packs/day: 0.3 packs/day for 51.0 years (12.8 ttl pk-yrs)    Types: Cigarettes    Passive exposure: Current  Smokeless tobacco: Never   Tobacco comments:    1-2 cigarettes per day KD-10/31/23  Vaping Use   Vaping status: Never Used  Substance and Sexual Activity   Alcohol use: No    Alcohol/week: 0.0 standard drinks of alcohol   Drug use: No   Sexual activity: Not Currently  Other Topics Concern   Not on file  Social History Narrative      Daughter Dorian is CMA Adult Nurse primary care [facility]   Retired Information Systems Manager   Smoker, no EtOH/drugs   Social Drivers of Corporate Investment Banker Strain: Medium Risk (08/09/2024)   Overall Financial Resource Strain (CARDIA)    Difficulty of Paying Living Expenses: Somewhat hard  Food Insecurity: No Food Insecurity (08/09/2024)   Hunger Vital Sign    Worried About Running Out of Food in the Last Year: Never true    Ran Out of Food in the Last Year: Never true  Transportation Needs: No Transportation Needs (08/09/2024)   PRAPARE - Administrator, Civil Service (Medical): No    Lack of Transportation (Non-Medical): No   Physical Activity: Inactive (08/09/2024)   Exercise Vital Sign    Days of Exercise per Week: 0 days    Minutes of Exercise per Session: Not on file  Stress: No Stress Concern Present (08/09/2024)   [employer] of Occupational Health - Occupational Stress Questionnaire    Feeling of Stress: Not at all  Social Connections: Moderately Isolated (08/09/2024)   Social Connection and Isolation Panel    Frequency of Communication with Friends and Family: More than three times a week    Frequency of Social Gatherings with Friends and Family: Once a week    Attends Religious Services: More than 4 times per year    Active Member of [employer] or Organizations: No    Attends Engineer, Structural: Not on file    Marital Status: Never married  Intimate Partner Violence: Not At Risk (08/01/2023)   Humiliation, Afraid, Rape, and Kick questionnaire    Fear of Current or Ex-Partner: No    Emotionally Abused: No    Physically Abused: No    Sexually Abused: No     Review of Systems: General: negative for chills, fever, night sweats or weight changes.  Cardiovascular: negative for chest pain, dyspnea on exertion, edema, orthopnea, palpitations, paroxysmal nocturnal dyspnea or shortness of breath Dermatological: negative for rash Respiratory: negative for cough or wheezing Urologic: negative for hematuria Abdominal: negative for nausea, vomiting, diarrhea, bright red blood per rectum, melena, or hematemesis Neurologic: negative for visual changes, syncope, or dizziness All other systems reviewed and are otherwise negative except as noted above.    Blood pressure 125/68, pulse 68, height 5' 1 (1.549 m), weight 98 lb (44.5 kg), SpO2 99%.  General appearance: alert and no distress Neck: no adenopathy, no JVD, supple, symmetrical, trachea midline, thyroid  not enlarged, symmetric, no tenderness/mass/nodules, and left carotid Darneshia Demary Lungs: clear to auscultation bilaterally Heart: regular rate  and rhythm, S1, S2 normal, no murmur, click, rub or gallop Extremities: extremities normal, atraumatic, no cyanosis or edema Pulses: Absent pedal pulses Skin: Skin color, texture, turgor normal. No rashes or lesions Neurologic: Grossly normal  EKG not performed today      ASSESSMENT AND PLAN:   Smoker Long history of tobacco abuse now only smoking 1 cigarette a day.  She does complain of dyspnea on exertion which has not changed in severity.  I suspect this is related to COPD.  Essential hypertension History of essential hypertension blood pressure measured today 125/68.  She is on metoprolol  and Benicar .  CAROTID BRUITS, BILATERAL History of carotid artery disease with recent Dopplers performed 07/17/2024 showing moderate bilateral ICA stenosis left greater than right.  This will be repeated on an annual basis.  Coronary atherosclerosis History of CAD status post RCA stenting by Dr. Lavon in 1999.  She denies chest pain.  Dyslipidemia History of dyslipidemia on high-dose atorvastatin  with lipid profile performed 01/06/24 revealing total cholesterol of 142, LDL 72 and HDL 54.  Peripheral arterial disease History of PAD status post bilateral iliac stenting by myself most recently 12/08/2020.  She has known occluded SFAs bilaterally.  She denies claudication.  Her most recent Dopplers performed 07/17/2024 suggest patent iliac stents.  Will continue to follow this on an annual basis.  Renal artery stenosis, native, bilateral History of renal stenting in the past with angiography performed 09/29/2020 revealing an occluded right renal artery stent.  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  I performed SMA stenting 09/29/2020 with an excellent result.  Her mesenteric ischemic symptoms resolved and she is able to eat without discomfort.  Her last mesenteric Dopplers performed 07/03/2023 did reveal high-grade celiac artery and SMA disease  although she remains asymptomatic.  Will repeat Doppler studies if she develops recurrent symptoms.     Dorn DOROTHA Lesches MD FACP,FACC,FAHA, [facility] 08/10/2024 11:00 AM

## 2024-08-10 NOTE — Assessment & Plan Note (Signed)
 History of PAD status post bilateral iliac stenting by myself most recently 12/08/2020.  She has known occluded SFAs bilaterally.  She denies claudication.  Her most recent Dopplers performed 07/17/2024 suggest patent iliac stents.  Will continue to follow this on an annual basis.

## 2024-08-10 NOTE — Patient Instructions (Signed)
 Medication Instructions:  Your physician recommends that you continue on your current medications as directed. Please refer to the Current Medication list given to you today.  *If you need a refill on your cardiac medications before your next appointment, please call your pharmacy*  Testing/Procedures: Your physician has requested that you have a carotid duplex. This test is an ultrasound of the carotid arteries in your neck. It looks at blood flow through these arteries that supply the brain with blood. Allow one hour for this exam. There are no restrictions or special instructions. This will take place at [address], 4th floor  **To do in November 2026**  Please note: We ask at that you not bring children with you during ultrasound (echo/ vascular) testing. Due to room size and safety concerns, children are not allowed in the ultrasound rooms during exams. Our front office staff cannot provide observation of children in our lobby area while testing is being conducted. An adult accompanying a patient to their appointment will only be allowed in the ultrasound room at the discretion of the ultrasound technician under special circumstances. We apologize for any inconvenience.   Your physician has requested that you have an Aorta/Iliac Duplex. This will take place at [address] 4th floor  No food after 11PM the night before.  Water is OK. (Don't drink liquids if you have been instructed not to for ANOTHER test) Avoid foods that produce bowel gas, for 24 hours prior to exam (see below). No breakfast, no chewing gum, no smoking or carbonated beverages. Patient may take morning medications with water. Come in for test at least 15 minutes early to register. **To do in November 2026**  Please note: We ask at that you not bring children with you during ultrasound (echo/ vascular) testing. Due to room size and safety concerns, children are not allowed in the ultrasound rooms during exams. Our  front office staff cannot provide observation of children in our lobby area while testing is being conducted. An adult accompanying a patient to their appointment will only be allowed in the ultrasound room at the discretion of the ultrasound technician under special circumstances. We apologize for any inconvenience.   Your physician has requested that you have an ankle brachial index (ABI). During this test an ultrasound and blood pressure cuff are used to evaluate the arteries that supply the arms and legs with blood. Allow thirty minutes for this exam. There are no restrictions or special instructions. This will take place at [address] 4th floor  **To do in November 2026**   Please note: We ask at that you not bring children with you during ultrasound (echo/ vascular) testing. Due to room size and safety concerns, children are not allowed in the ultrasound rooms during exams. Our front office staff cannot provide observation of children in our lobby area while testing is being conducted. An adult accompanying a patient to their appointment will only be allowed in the ultrasound room at the discretion of the ultrasound technician under special circumstances. We apologize for any inconvenience.   Follow-Up: At [facility], you and your health needs are our priority.  As part of our continuing mission to provide you with exceptional heart care, our providers are all part of one team.  This team includes your primary Cardiologist (physician) and Advanced Practice Providers or APPs (Physician Assistants and Nurse Practitioners) who all work together to provide you with the care you need, when you need it.  Your next  appointment:   12 month(s)  Provider:   Dorn Lesches, MD    We recommend signing up for the patient portal called MyChart.  Sign up information is provided on this After Visit Summary.  MyChart is used to connect with patients for Virtual Visits (Telemedicine).   Patients are able to view lab/test results, encounter notes, upcoming appointments, etc.  Non-urgent messages can be sent to your provider as well.   To learn more about what you can do with MyChart, go to [email or web address].   Other Instructions

## 2024-08-10 NOTE — Assessment & Plan Note (Signed)
 History of CAD status post RCA stenting by Dr. Lavon in 1999.  She denies chest pain.

## 2024-08-10 NOTE — Assessment & Plan Note (Signed)
 History of renal stenting in the past with angiography performed 09/29/2020 revealing an occluded right renal artery stent.

## 2024-08-10 NOTE — Assessment & Plan Note (Signed)
 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  I performed SMA stenting 09/29/2020 with an excellent result.  Her mesenteric ischemic symptoms resolved and she is able to eat without discomfort.  Her last mesenteric Dopplers performed 07/03/2023 did reveal high-grade celiac artery and SMA disease although she remains asymptomatic.  Will repeat Doppler studies if she develops recurrent symptoms.

## 2024-08-10 NOTE — Assessment & Plan Note (Signed)
 History of carotid artery disease with recent Dopplers performed 07/17/2024 showing moderate bilateral ICA stenosis left greater than right.  This will be repeated on an annual basis.

## 2024-08-10 NOTE — Assessment & Plan Note (Signed)
 Long history of tobacco abuse now only smoking 1 cigarette a day.  She does complain of dyspnea on exertion which has not changed in severity.  I suspect this is related to COPD.

## 2024-08-11 ENCOUNTER — Ambulatory Visit: Payer: 59

## 2024-08-11 VITALS — BP 120/60 | HR 75 | Temp 97.7°F | Ht 61.0 in | Wt 98.0 lb

## 2024-08-11 DIAGNOSIS — Z0000 Encounter for general adult medical examination without abnormal findings: Secondary | ICD-10-CM

## 2024-08-11 NOTE — Progress Notes (Signed)
 Chief Complaint  Patient presents with   Medicare Wellness     Subjective:   Christine Meyer is a 73 y.o. female who presents for a Medicare Annual Wellness Visit.  Visit info / Clinical Intake: Medicare Wellness Visit Type:: Subsequent Annual Wellness Visit Persons participating in visit and providing information:: patient Medicare Wellness Visit Mode:: In-person (required for WTM) Interpreter Needed?: No Pre-visit prep was completed: yes AWV questionnaire completed by patient prior to visit?: yes Date:: 08/09/24 Living arrangements:: (!) lives alone Patient's Overall Health Status Rating: good Typical amount of pain: none Does pain affect daily life?: no Are you currently prescribed opioids?: no  Dietary Habits and Nutritional Risks How many meals a day?: 2 Eats fruit and vegetables daily?: yes Most meals are obtained by: preparing own meals In the last 2 weeks, have you had any of the following?: none Diabetic:: (!) yes Any non-healing wounds?: no How often do you check your BS?: as needed Would you like to be referred to a Nutritionist or for Diabetic Management? : no  Functional Status Activities of Daily Living (to include ambulation/medication): Independent Ambulation: Independent with device- listed below Home Assistive Devices/Equipment: Eyeglasses Medication Administration: Independent Home Management (perform basic housework or laundry): Independent Manage your own finances?: yes Primary transportation is: driving Concerns about vision?: no *vision screening is required for WTM* Concerns about hearing?: no  Fall Screening Falls in the past year?: 0 Number of falls in past year: 0 Was there an injury with Fall?: 0 Fall Risk Category Calculator: 0 Patient Fall Risk Level: Low Fall Risk  Fall Risk Patient at Risk for Falls Due to: No Fall Risks  Home and Transportation Safety: All rugs have non-skid backing?: N/A, no rugs All stairs or steps have  railings?: N/A, no stairs Grab bars in the bathtub or shower?: (!) no Have non-skid surface in bathtub or shower?: yes Good home lighting?: yes Regular seat belt use?: yes Hospital stays in the last year:: no  Cognitive Assessment Difficulty concentrating, remembering, or making decisions? : yes Will 6CIT or Mini Cog be Completed: yes What year is it?: 0 points What month is it?: 0 points Give patient an address phrase to remember (5 components): [address] [place]. About what time is it?: 0 points Count backwards from 20 to 1: 0 points Say the months of the year in reverse: 0 points Repeat the address phrase from earlier: 6 points 6 CIT Score: 6 points  Advance Directives (For Healthcare) Does Patient Have a Medical Advance Directive?: Yes Does patient want to make changes to medical advance directive?: No - Patient declined Type of Advance Directive: Healthcare Power of [place]; Living will Copy of Healthcare Power of Attorney in Chart?: Yes - validated most recent copy scanned in chart (See row information) Copy of Living Will in Chart?: Yes - validated most recent copy scanned in chart (See row information)  Reviewed/Updated  Reviewed/Updated: Reviewed All (Medical, Surgical, Family, Medications, Allergies, Care Teams, Patient Goals)    Allergies (verified) Patient has no known allergies.   Current Medications (verified) Outpatient Encounter Medications as of 08/11/2024  Medication Sig   ACCU-CHEK SOFTCLIX LANCETS lancets Check blood sugar once daily. Dx:E11.9   aspirin  EC 81 MG tablet Take 1 tablet (81 mg total) by mouth daily.   atorvastatin  (LIPITOR ) 80 MG tablet Take 1 tablet (80 mg total) by mouth daily.   azithromycin  (ZITHROMAX ) 250 MG tablet 2 today then one daily- antibiotic (Patient not taking: Reported on 08/11/2024)  azithromycin  (ZITHROMAX ) 250 MG tablet Take 1 tablet (250 mg total) by mouth daily. Take first 2 tablets together, then 1 every day until  finished. (Patient not taking: Reported on 08/11/2024)   Blood Glucose Monitoring Suppl (ONETOUCH VERIO) w/Device KIT Use daily to check blood sugar.   DX E11.9   Calcium  500-100 MG-UNIT CHEW Chew 1 tablet by mouth daily before breakfast.   Cholecalciferol  (VITAMIN D3) 50 MCG (2000 UT) capsule Take 1 capsule (2,000 Units total) by mouth daily.   clopidogrel  (PLAVIX ) 75 MG tablet TAKE 1 TABLET BY MOUTH DAILY   cyanocobalamin  (VITAMIN B12) 1000 MCG/ML injection Inject 1 mL (1,000 mcg total) into the muscle every 30 (thirty) days.   cyanocobalamin  (VITAMIN B12) 1000 MCG/ML injection Inject 1 mL (1,000 mcg total) into the skin every 30 (thirty) days.   FEROSUL 325 (65 Fe) MG tablet TAKE 1 TABLET BY MOUTH DAILY   glucose blood (ONETOUCH VERIO) test strip USE AS DIRECTED DAILY   guaiFENesin  (MUCINEX ) 600 MG 12 hr tablet Take 1 tablet (600 mg total) by mouth 2 (two) times daily.   INSULIN  SYRINGE .5CC/29G 29G X 1/2 0.5 ML MISC Inject 1 each into the muscle every 30 (thirty) days.   ipratropium-albuterol  (DUONEB) 0.5-2.5 (3) MG/3ML SOLN Take 3 mLs by nebulization every 6 (six) hours as needed.   loratadine  (CLARITIN ) 10 MG tablet Take 1 tablet (10 mg total) by mouth daily.   metoprolol  succinate (TOPROL -XL) 25 MG 24 hr tablet Take 1 tablet (25 mg total) by mouth daily. Take with or immediately following a meal   mirtazapine  (REMERON ) 7.5 MG tablet Take 1 tablet (7.5 mg total) by mouth at bedtime.   Multiple Vitamins-Minerals (A THRU Z SELECT 50+ ADVANCED) TABS TAKE 1 TABLET BY MOUTH DAILY (Patient taking differently: Take 1 tablet by mouth daily.)   olmesartan  (BENICAR ) 20 MG tablet Take 1 tablet (20 mg total) by mouth daily.   pantoprazole  (PROTONIX ) 40 MG tablet TAKE 1 TABLET(40 MG) BY MOUTH DAILY BEFORE BREAKFAST   Pirfenidone  801 MG TABS TAKE 1 TABLET (801MG ) BY MOUTH  THREE TIMES DAILY WITH FOOD   predniSONE  (DELTASONE ) 10 MG tablet Take 4 tablets (40 mg total) by mouth daily.   predniSONE   (DELTASONE ) 20 MG tablet 1 each morning till gone   promethazine -dextromethorphan  (PROMETHAZINE -DM) 6.25-15 MG/5ML syrup Take 5 mLs by mouth 4 (four) times daily as needed.   spironolactone  (ALDACTONE ) 25 MG tablet Take 1 tablet (25 mg total) by mouth daily.   STIOLTO RESPIMAT  2.5-2.5 MCG/ACT AERS USE 2 INHALATIONS BY MOUTH DAILY   sucralfate  (CARAFATE ) 1 g tablet Take 1 tablet (1 g total) by mouth 4 (four) times daily -  with meals and at bedtime.   SYRINGE-NEEDLE, DISP, 3 ML (BD INTEGRA SYRINGE) 25G X 1 3 ML MISC Use to inject b12 once a month.   Tiotropium Bromide -Olodaterol (STIOLTO RESPIMAT ) 2.5-2.5 MCG/ACT AERS Inhale 2 puffs into the lungs daily.   VENTOLIN  HFA 108 (90 Base) MCG/ACT inhaler INHALE 1 TO 2 PUFFS INTO THE LUNGS EVERY 6 HOURS AS NEEDED FOR WHEEZING OR SHORTNESS OF BREATH   No facility-administered encounter medications on file as of 08/11/2024.    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   CAD (coronary artery disease)    Stent RCA 1999   Chronic kidney disease    COPD (chronic obstructive pulmonary disease) (HCC)  Diabetes mellitus, type 2 (HCC)    Diet controlled   Gastric polyps - hyperplastic 02/23/2018   Hyperlipidemia    Hypertension    OSA (obstructive sleep apnea) [name]    PVD (peripheral vascular disease)    Carotid stenosis, renal artery stenosis   Sleep apnea    dx'd but doesn't use mask    Uterine cancer (HCC) ?8019'd   Past Surgical History:  Procedure Laterality Date   ABDOMINAL ANGIOGRAM  11/22/2014   Procedure: ABDOMINAL ANGIOGRAM;  Surgeon: Dorn JINNY Lesches, MD;  Location: [facility] CATH LAB;  Service: Cardiovascular;;   ABDOMINAL AORTOGRAM W/LOWER EXTREMITY N/A 10/03/2020   Procedure: ABDOMINAL AORTOGRAM W/LOWER EXTREMITY;  Surgeon: Lesches Dorn JINNY, MD;  Location: [facility] INVASIVE CV LAB;  Service: Cardiovascular;  Laterality: N/A;   COLONOSCOPY   2000   negative   CORONARY ANGIOPLASTY WITH STENT PLACEMENT  1999   RCA; Dr. Lavon G4 P4   ESOPHAGOGASTRODUODENOSCOPY     ILIAC ARTERY STENT Right 11/22/2014   LOWER EXTREMITY ANGIOGRAM N/A 11/22/2014   Procedure: LOWER EXTREMITY ANGIOGRAM;  Surgeon: Dorn JINNY Lesches, MD; L-SFA 100%, 3v runoff, R-pCIA 95>>0% w/ 8 mm x 18 mm long  Balloon expandable stent; R-dCIA 67>>0% w/ 8 mm x 4 cm long Cordis Smart Nitinol self  expanding stent    LOWER EXTREMITY ANGIOGRAPHY Bilateral 12/08/2020   Procedure: Lower Extremity Angiography;  Surgeon: Lesches Dorn JINNY, MD;  Location: [facility] INVASIVE CV LAB;  Service: Cardiovascular;  Laterality: Bilateral;   PERIPHERAL VASCULAR ATHERECTOMY Left 12/08/2020   Procedure: PERIPHERAL VASCULAR ATHERECTOMY;  Surgeon: Lesches Dorn JINNY, MD;  Location: [facility] INVASIVE CV LAB;  Service: Cardiovascular;  Laterality: Left;  common iliac   PERIPHERAL VASCULAR INTERVENTION Left 12/08/2020   Procedure: PERIPHERAL VASCULAR INTERVENTION;  Surgeon: Lesches Dorn JINNY, MD;  Location: [facility] INVASIVE CV LAB;  Service: Cardiovascular;  Laterality: Left;  common iliac   RENAL ARTERY STENT Right May 2007; 08/2006   ; restenosis   TOTAL ABDOMINAL HYSTERECTOMY  ?8019'd    for uterine CA   VISCERAL ARTERY INTERVENTION N/A 10/03/2020   Procedure: VISCERAL ARTERY INTERVENTION;  Surgeon: Lesches Dorn JINNY, MD;  Location: [facility] INVASIVE CV LAB;  Service: Cardiovascular;  Laterality: N/A;  SMA   Family History  Problem Relation Age of Onset   Heart failure Mother        after hip fx.    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    Employer: [employer]  Tobacco Use   Smoking status: Every Day    Current packs/day: 0.25    Average packs/day: 0.3 packs/day for 51.0 years (12.8 ttl pk-yrs)    Types: Cigarettes    Passive exposure: Current  Smokeless tobacco: Never   Tobacco comments:    1-2 cigarettes per day KD-10/31/23  Vaping Use   Vaping status: Never Used  Substance and Sexual Activity   Alcohol use: No    Alcohol/week: 0.0 standard drinks of alcohol   Drug use: No   Sexual activity: Not Currently   Tobacco Counseling Ready to quit: No Counseling given: Yes Tobacco comments: 1-2 cigarettes per day KD-10/31/23  SDOH Screenings   Food Insecurity: No Food Insecurity (08/11/2024)  Housing: Unknown (08/11/2024)  Transportation Needs: No Transportation Needs (08/11/2024)  Utilities: Not At Risk (08/11/2024)  Alcohol Screen: Low Risk  (08/01/2023)  Depression (PHQ2-9): Low Risk  (08/11/2024)  Financial Resource Strain: Medium Risk (08/09/2024)  Physical Activity: Inactive (08/11/2024)  Social Connections: Moderately Isolated (08/11/2024)  Stress: No Stress Concern Present (08/11/2024)  Tobacco Use: High Risk (08/11/2024)  Health Literacy: Adequate Health Literacy (08/11/2024)   See flowsheets for full screening details  Depression Screen PHQ 2 & 9 Depression Scale- Over the past 2 weeks, how often have you been bothered by any of the following problems? Little interest or pleasure in doing things: 0 Feeling down, depressed, or hopeless (PHQ Adolescent also includes...irritable): 0 PHQ-2 Total Score: 0     Goals Addressed               This Visit's Progress     Remain active (pt-stated)               Objective:    Today's Vitals   08/11/24 0805  BP: 120/60  Pulse: 75  Temp:  97.7 F (36.5 C)  TempSrc: Oral  SpO2: 99%  Weight: 98 lb (44.5 kg)  Height: 5' 1 (1.549 m)   Body mass index is 18.52 kg/m.  Hearing/Vision screen Hearing Screening - Comments:: Denies hearing difficulties   Vision Screening - Comments:: Wears rx glasses - up to date with routine eye exams with  [facility] Immunizations and Health Maintenance Health Maintenance  Topic Date Due   Zoster Vaccines- Shingrix (1 of 2) Never done   Diabetic kidney evaluation - Urine ACR  02/17/2011   Influenza Vaccine  04/10/2024   COVID-19 Vaccine (5 - 2025-26 season) 05/11/2024   HEMOGLOBIN A1C  07/07/2024   FOOT EXAM  10/15/2024   OPHTHALMOLOGY EXAM  11/04/2024   Diabetic kidney evaluation - eGFR measurement  05/22/2025   Medicare Annual Wellness (AWV)  08/11/2025   Colonoscopy  02/19/2028   DTaP/Tdap/Td (3 - Td or Tdap) 07/07/2033   Pneumococcal Vaccine: 50+ Years  Completed   Bone Density Scan  Completed   Hepatitis C Screening  Completed   Meningococcal B Vaccine  Aged Out   Mammogram  Discontinued        Assessment/Plan:  This is a routine wellness examination for Christine Meyer.  Patient Care Team: Antonio Meth, Jamee SAUNDERS, DO as PCP - General (Family Medicine) Court Dorn PARAS, MD as PCP - Cardiology (Cardiology) Camillo Golas, MD as Consulting Physician (Ophthalmology) Alaine Vicenta NOVAK, MD as Consulting Physician (Pulmonary Disease) Court Dorn PARAS, MD as Consulting Physician (Cardiology)  I have personally reviewed and noted the following in the patient's chart:   Medical and social history Use of alcohol, tobacco or illicit drugs  Current medications and supplements including opioid prescriptions. Functional ability and status Nutritional status Physical activity Advanced directives List of other physicians Hospitalizations, surgeries, and ER visits in previous 12 months Vitals Screenings to include cognitive, depression, and falls Referrals and appointments  No  orders  of the defined types were placed in this encounter.  In addition, I have reviewed and discussed with patient certain preventive protocols, quality metrics, and best practice recommendations. A written personalized care plan for preventive services as well as general preventive health recommendations were provided to patient.   Rojelio LELON Blush, LPN   [id number]   Return in 1 year on 08/17/25  After Visit Summary: (In Person-Printed) AVS printed and given to the patient  Nurse Notes: None

## 2024-08-11 NOTE — Patient Instructions (Addendum)
 Christine Meyer,  Thank you for taking the time for your Medicare Wellness Visit. I appreciate your continued commitment to your health goals. Please review the care plan we discussed, and feel free to reach out if I can assist you further.  Please note that Annual Wellness Visits do not include a physical exam. Some assessments may be limited, especially if the visit was conducted virtually. If needed, we may recommend an in-person follow-up with your provider.  Ongoing Care Seeing your primary care provider every 3 to 6 months helps us  monitor your health and provide consistent, personalized care.   Referrals If a referral was made during today's visit and you haven't received any updates within two weeks, please contact the referred provider directly to check on the status.  Recommended Screenings:  Health Maintenance  Topic Date Due   Zoster (Shingles) Vaccine (1 of 2) Never done   Yearly kidney health urinalysis for diabetes  02/17/2011   Flu Shot  04/10/2024   COVID-19 Vaccine (5 - 2025-26 season) 05/11/2024   Hemoglobin A1C  07/07/2024   Complete foot exam   10/15/2024   Eye exam for diabetics  11/04/2024   Yearly kidney function blood test for diabetes  05/22/2025   Medicare Annual Wellness Visit  08/11/2025   Colon Cancer Screening  02/19/2028   DTaP/Tdap/Td vaccine (3 - Td or Tdap) 07/07/2033   Pneumococcal Vaccine for age over 75  Completed   Osteoporosis screening with Bone Density Scan  Completed   Hepatitis C Screening  Completed   Meningitis B Vaccine  Aged Out   Breast Cancer Screening  Discontinued       08/11/2024    8:24 AM  Advanced Directives  Does Patient Have a Medical Advance Directive? Yes  Type of Estate Agent of [place];Living will  Does patient want to make changes to medical advance directive? No - Patient declined  Copy of Healthcare Power of Attorney in Chart? Yes - validated most recent copy scanned in chart (See row  information)    Vision: Annual vision screenings are recommended for early detection of glaucoma, cataracts, and diabetic retinopathy. These exams can also reveal signs of chronic conditions such as diabetes and high blood pressure.  Dental: Annual dental screenings help detect early signs of oral cancer, gum disease, and other conditions linked to overall health, including heart disease and diabetes.  Please see the attached documents for additional preventive care recommendations.

## 2024-08-12 ENCOUNTER — Other Ambulatory Visit: Payer: Self-pay | Admitting: Internal Medicine

## 2024-08-18 NOTE — Progress Notes (Signed)
 Christine Meyer                                          MRN: [id number]   08/18/2024   The VBCI Quality Team Specialist reviewed this patient medical record for the purposes of chart review for care gap closure. The following were reviewed: chart review for care gap closure-glycemic status assessment and kidney health evaluation for diabetes:eGFR  and uACR.    VBCI Quality Team

## 2024-08-24 ENCOUNTER — Other Ambulatory Visit (HOSPITAL_BASED_OUTPATIENT_CLINIC_OR_DEPARTMENT_OTHER): Payer: Self-pay

## 2024-08-24 ENCOUNTER — Ambulatory Visit: Payer: Self-pay | Admitting: Family Medicine

## 2024-08-24 ENCOUNTER — Ambulatory Visit (HOSPITAL_BASED_OUTPATIENT_CLINIC_OR_DEPARTMENT_OTHER)
Admission: RE | Admit: 2024-08-24 | Discharge: 2024-08-24 | Disposition: A | Source: Ambulatory Visit | Attending: Family Medicine | Admitting: Family Medicine

## 2024-08-24 ENCOUNTER — Encounter: Payer: Self-pay | Admitting: Family Medicine

## 2024-08-24 ENCOUNTER — Other Ambulatory Visit: Payer: Self-pay | Admitting: Family Medicine

## 2024-08-24 ENCOUNTER — Ambulatory Visit: Admitting: Family Medicine

## 2024-08-24 VITALS — BP 120/68 | HR 81 | Temp 98.2°F | Resp 18 | Ht 61.0 in | Wt 98.4 lb

## 2024-08-24 DIAGNOSIS — J40 Bronchitis, not specified as acute or chronic: Secondary | ICD-10-CM

## 2024-08-24 DIAGNOSIS — J441 Chronic obstructive pulmonary disease with (acute) exacerbation: Secondary | ICD-10-CM | POA: Diagnosis not present

## 2024-08-24 DIAGNOSIS — R051 Acute cough: Secondary | ICD-10-CM

## 2024-08-24 DIAGNOSIS — D649 Anemia, unspecified: Secondary | ICD-10-CM

## 2024-08-24 DIAGNOSIS — E1169 Type 2 diabetes mellitus with other specified complication: Secondary | ICD-10-CM

## 2024-08-24 DIAGNOSIS — E538 Deficiency of other specified B group vitamins: Secondary | ICD-10-CM | POA: Diagnosis not present

## 2024-08-24 DIAGNOSIS — E785 Hyperlipidemia, unspecified: Secondary | ICD-10-CM

## 2024-08-24 DIAGNOSIS — J84112 Idiopathic pulmonary fibrosis: Secondary | ICD-10-CM | POA: Diagnosis not present

## 2024-08-24 DIAGNOSIS — I10 Essential (primary) hypertension: Secondary | ICD-10-CM

## 2024-08-24 DIAGNOSIS — R413 Other amnesia: Secondary | ICD-10-CM | POA: Diagnosis not present

## 2024-08-24 DIAGNOSIS — E559 Vitamin D deficiency, unspecified: Secondary | ICD-10-CM | POA: Insufficient documentation

## 2024-08-24 DIAGNOSIS — E441 Mild protein-calorie malnutrition: Secondary | ICD-10-CM | POA: Insufficient documentation

## 2024-08-24 LAB — COMPREHENSIVE METABOLIC PANEL WITH GFR
ALT: 10 U/L (ref 0–35)
AST: 16 U/L (ref 0–37)
Albumin: 4.2 g/dL (ref 3.5–5.2)
Alkaline Phosphatase: 72 U/L (ref 39–117)
BUN: 21 mg/dL (ref 6–23)
CO2: 27 meq/L (ref 19–32)
Calcium: 9.7 mg/dL (ref 8.4–10.5)
Chloride: 107 meq/L (ref 96–112)
Creatinine, Ser: 1.47 mg/dL — ABNORMAL HIGH (ref 0.40–1.20)
GFR: 34.73 mL/min — ABNORMAL LOW (ref >60.00)
Glucose, Bld: 83 mg/dL (ref 70–99)
Potassium: 4.8 meq/L (ref 3.5–5.1)
Sodium: 141 meq/L (ref 135–145)
Total Bilirubin: 0.4 mg/dL (ref 0.2–1.2)
Total Protein: 6.6 g/dL (ref 6.0–8.3)

## 2024-08-24 LAB — CBC WITH DIFFERENTIAL/PLATELET
Basophils Absolute: 0 K/uL (ref 0.0–0.1)
Basophils Relative: 0.2 % (ref 0.0–3.0)
Eosinophils Absolute: 0.2 K/uL (ref 0.0–0.7)
Eosinophils Relative: 4 % (ref 0.0–5.0)
HCT: 31.7 % — ABNORMAL LOW (ref 36.0–46.0)
Hemoglobin: 10.5 g/dL — ABNORMAL LOW (ref 12.0–15.0)
Lymphocytes Relative: 30.5 % (ref 12.0–46.0)
Lymphs Abs: 1.4 K/uL (ref 0.7–4.0)
MCHC: 33.2 g/dL (ref 30.0–36.0)
MCV: 89.9 fl (ref 78.0–100.0)
Monocytes Absolute: 0.4 K/uL (ref 0.1–1.0)
Monocytes Relative: 8.4 % (ref 3.0–12.0)
Neutro Abs: 2.6 K/uL (ref 1.4–7.7)
Neutrophils Relative %: 56.9 % (ref 43.0–77.0)
Platelets: 215 K/uL (ref 150.0–400.0)
RBC: 3.52 Mil/uL — ABNORMAL LOW (ref 3.87–5.11)
RDW: 14.9 % (ref 11.5–15.5)
WBC: 4.6 K/uL (ref 4.0–10.5)

## 2024-08-24 LAB — LIPID PANEL
Cholesterol: 123 mg/dL (ref 0–200)
HDL: 43.8 mg/dL (ref >39.00)
LDL Cholesterol: 58 mg/dL (ref 0–99)
NonHDL: 79.07
Total CHOL/HDL Ratio: 3
Triglycerides: 103 mg/dL (ref 0.0–149.0)
VLDL: 20.6 mg/dL (ref 0.0–40.0)

## 2024-08-24 LAB — VITAMIN B12: Vitamin B-12: 1485 pg/mL — ABNORMAL HIGH (ref 211–911)

## 2024-08-24 LAB — HEMOGLOBIN A1C: Hgb A1c MFr Bld: 6 % (ref 4.6–6.5)

## 2024-08-24 LAB — TSH: TSH: 2.79 u[IU]/mL (ref 0.35–5.50)

## 2024-08-24 LAB — VITAMIN D 25 HYDROXY (VIT D DEFICIENCY, FRACTURES): VITD: 69.94 ng/mL (ref 30.00–100.00)

## 2024-08-24 MED ORDER — BENZONATATE 200 MG PO CAPS
200.0000 mg | ORAL_CAPSULE | Freq: Two times a day (BID) | ORAL | 0 refills | Status: AC | PRN
  Filled 2024-08-24: qty 20, 10d supply, fill #0

## 2024-08-24 MED ORDER — AZITHROMYCIN 250 MG PO TABS
ORAL_TABLET | ORAL | 0 refills | Status: AC
  Filled 2024-08-24 (×2): qty 6, 5d supply, fill #0

## 2024-08-24 MED ORDER — DONEPEZIL HCL 5 MG PO TABS: 5.0000 mg | ORAL_TABLET | Freq: Every day | ORAL | 1 refills | Status: AC

## 2024-08-24 MED ORDER — BENZONATATE 200 MG PO CAPS
200.0000 mg | ORAL_CAPSULE | Freq: Two times a day (BID) | ORAL | 0 refills | Status: DC | PRN
  Filled 2024-08-24: qty 20, 10d supply, fill #0

## 2024-08-24 MED ORDER — AZITHROMYCIN 250 MG PO TABS: ORAL_TABLET | ORAL | 0 refills | Status: DC

## 2024-08-24 NOTE — Assessment & Plan Note (Signed)
 Check labs

## 2024-08-24 NOTE — Assessment & Plan Note (Signed)
 20/30 mmse Aricept  started  Refer to neurology Mri brain

## 2024-08-24 NOTE — Assessment & Plan Note (Signed)
 Per pulmonary

## 2024-08-24 NOTE — Assessment & Plan Note (Signed)
 Pt encouraged to eat 3 meals a day and drink ensure in between.

## 2024-08-24 NOTE — Progress Notes (Signed)
 Established Patient Office Visit  Subjective   Patient ID: Christine Meyer, female    DOB: 1950/12/01  Age: 73 y.o. MRN: [id number]  Chief Complaint  Patient presents with   Hypertension   Follow-up    HPI Discussed the use of AI scribe software for clinical note transcription with the patient, who gave verbal consent to proceed.  History of Present Illness Christine Meyer is a 73 year old female with chronic respiratory issues who presents with worsening shortness of breath and cough. She is accompanied by her nephew.  She experiences worsening shortness of breath, particularly during physical activities, which has progressively deteriorated over time. Episodes occur where her breath 'just went short,' necessitating her to sit down. She uses inhalers and a nebulizer daily to manage her symptoms.  She has a persistent cough producing yellow sputum, more pronounced at night. She uses Mucinex  and Tessalon  Perles to manage these symptoms. No fever or sinus congestion is present.  She reports a lack of taste for food and decreased appetite, leading to reduced food intake. She supplements her diet with Ensure or Boost, consuming one or two per day, but notes she has only one left. She weighs 98 pounds and acknowledges some weight loss.  There is concern about her memory, as she sometimes forgets things but states that 'it comes back.' Her nephew notes repetitive storytelling and same-day forgetfulness.   Patient Active Problem List   Diagnosis Date Noted   Mild protein-calorie malnutrition 08/24/2024   Vitamin D  deficiency 08/24/2024   Memory loss 08/24/2024   B12 deficiency 10/07/2023   Early satiety 10/09/2022   IPF (idiopathic pulmonary fibrosis) (HCC) 07/11/2022   Preventative health care 01/23/2022   Hyperlipidemia associated with type 2 diabetes mellitus (HCC) 01/23/2022   Claudication in peripheral vascular disease 12/08/2020   Mesenteric ischemia 08/16/2020   Bronchitis  07/10/2020   COPD with acute exacerbation (HCC) 06/30/2020   Rheumatoid arthritis involving multiple sites (HCC) 01/21/2020   Coagulation disorder 01/21/2020   Uncontrolled type 2 diabetes mellitus with hyperglycemia (HCC) 01/21/2020   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   Hyperlipidemia LDL goal <70 06/28/2018   Degenerative disc disease, cervical 03/27/2018   Gastric polyps - hyperplastic 02/23/2018   Antiplatelet or antithrombotic long-term use - clopidogrel  - CAD/PAD 02/13/2018   Osteopenia 01/26/2018   Dyspepsia 01/26/2018   Rotator cuff tear, left 01/24/2018   COPD (chronic obstructive pulmonary disease) (HCC) 07/26/2017   Weight loss 04/22/2017   Cough 04/22/2017   Current every day smoker 04/22/2017   Bilateral carotid artery disease 07/04/2016   Claudication (HCC) 11/22/2014   Peripheral arterial disease 10/22/2014   Renal artery stenosis, native, bilateral 10/22/2014   Centrilobular emphysema (HCC) 10/19/2014   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   Pain in joint, lower leg 08/24/2014   Dyslipidemia 03/12/2011   Coronary atherosclerosis 09/19/2010   GERD 02/23/2010   Smoker 10/14/2009   EDEMA- LOCALIZED 04/06/2008   CAROTID BRUITS, BILATERAL 04/06/2008   Disorder resulting from impaired renal function 10/09/2007   DM (diabetes mellitus) type II, controlled, with peripheral vascular disorder (HCC) 07/07/2007   Anemia 07/07/2007   Essential hypertension 07/07/2007   HX, PERSONAL, MALIGNANCY, UTERUS NEC 07/07/2007   Past Medical History:  Diagnosis Date   Anemia    NOS   Anginal pain    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   CAD (coronary artery disease)    Stent RCA 1999   Chronic kidney disease    COPD (chronic obstructive pulmonary disease) (  HCC)    Diabetes mellitus, type 2 (HCC)    Diet controlled   Gastric polyps - hyperplastic 02/23/2018   Hyperlipidemia    Hypertension    OSA (obstructive sleep apnea) not on  CPAP    PVD (peripheral vascular disease)    Carotid stenosis, renal artery stenosis   Sleep apnea    dx'd but doesn't use mask    Uterine cancer (HCC) ?8019'd   Past Surgical History:  Procedure Laterality Date   ABDOMINAL ANGIOGRAM  11/22/2014   Procedure: ABDOMINAL ANGIOGRAM;  Surgeon: Dorn JINNY Lesches, MD;  Location: [facility] CATH LAB;  Service: Cardiovascular;;   ABDOMINAL AORTOGRAM W/LOWER EXTREMITY N/A 10/03/2020   Procedure: ABDOMINAL AORTOGRAM W/LOWER EXTREMITY;  Surgeon: Lesches Dorn JINNY, MD;  Location: [facility] INVASIVE CV LAB;  Service: Cardiovascular;  Laterality: N/A;   COLONOSCOPY  2000   negative   CORONARY ANGIOPLASTY WITH STENT PLACEMENT  1999   RCA; Dr. Lavon G4 P4   ESOPHAGOGASTRODUODENOSCOPY     ILIAC ARTERY STENT Right 11/22/2014   LOWER EXTREMITY ANGIOGRAM N/A 11/22/2014   Procedure: LOWER EXTREMITY ANGIOGRAM;  Surgeon: Dorn JINNY Lesches, MD; L-SFA 100%, 3v runoff, R-pCIA 95>>0% w/ 8 mm x 18 mm long  Balloon expandable stent; R-dCIA 67>>0% w/ 8 mm x 4 cm long Cordis Smart Nitinol self  expanding stent    LOWER EXTREMITY ANGIOGRAPHY Bilateral 12/08/2020   Procedure: Lower Extremity Angiography;  Surgeon: Lesches Dorn JINNY, MD;  Location[facility] INVASIVE CV LAB;  Service: Cardiovascular;  Laterality: Bilateral;   PERIPHERAL VASCULAR ATHERECTOMY Left 12/08/2020   Procedure: PERIPHERAL VASCULAR ATHERECTOMY;  Surgeon: Lesches Dorn JINNY, MD;  Location: [facility] INVASIVE CV LAB;  Service: Cardiovascular;  Laterality: Left;  common iliac   PERIPHERAL VASCULAR INTERVENTION Left 12/08/2020   Procedure: PERIPHERAL VASCULAR INTERVENTION;  Surgeon: Lesches Dorn JINNY, MD;  Location: [facility] INVASIVE CV LAB;  Service: Cardiovascular;  Laterality: Left;  common iliac   RENAL ARTERY STENT Right May 2007; 08/2006   ; restenosis   TOTAL ABDOMINAL HYSTERECTOMY  ?8019'd    for uterine CA   VISCERAL ARTERY INTERVENTION N/A 10/03/2020   Procedure: VISCERAL ARTERY INTERVENTION;  Surgeon: Lesches Dorn JINNY, MD;   Location: [facility] INVASIVE CV LAB;  Service: Cardiovascular;  Laterality: N/A;  SMA   Social History[1] Social History   Socioeconomic History   Marital status: Single    Spouse name: Not on file   Number of children: 4   Years of education: Not on file   Highest education level: 12th grade  Occupational History   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    Employer: [employer]  Tobacco Use   Smoking status: Every Day    Current packs/day: 0.25    Average packs/day: 0.3 packs/day for 51.0 years (12.8 ttl pk-yrs)    Types: Cigarettes    Passive exposure: Current   Smokeless tobacco: Never   Tobacco comments:    1-2 cigarettes per day KD-10/31/23  Vaping Use   Vaping status: Never Used  Substance and Sexual Activity   Alcohol use: No    Alcohol/week: 0.0 standard drinks of alcohol   Drug use: No   Sexual activity: Not Currently  Other Topics Concern   Not on file  Social History Narrative      Daughter Dorian is CMA [facility]   Retired Information Systems Manager   Smoker, no EtOH/drugs   Social Drivers of Health   Tobacco Use: High Risk (08/24/2024)  Patient History    Smoking Tobacco Use: Every Day    Smokeless Tobacco Use: Never    Passive Exposure: Current  Financial Resource Strain: Medium Risk (08/09/2024)   Overall Financial Resource Strain (CARDIA)    Difficulty of Paying Living Expenses: Somewhat hard  Food Insecurity: No Food Insecurity (08/11/2024)   Epic    Worried About Programme Researcher, Broadcasting/film/video in the Last Year: Never true    Ran Out of Food in the Last Year: Never true  Transportation Needs: No Transportation Needs (08/11/2024)   Epic    Lack of Transportation (Medical): No    Lack of Transportation (Non-Medical): No  Physical Activity: Inactive (08/11/2024)   Exercise Vital Sign    Days of Exercise per Week: 0 days    Minutes of Exercise per Session: 0 min  Stress: No Stress Concern Present (08/11/2024)    [employer] of Occupational Health - Occupational Stress Questionnaire    Feeling of Stress: Not at all  Social Connections: Moderately Isolated (08/11/2024)   Social Connection and Isolation Panel    Frequency of Communication with Friends and Family: More than three times a week    Frequency of Social Gatherings with Friends and Family: Once a week    Attends Religious Services: More than 4 times per year    Active Member of Clubs or Organizations: No    Attends Banker Meetings: Not on file    Marital Status: Never married  Intimate Partner Violence: Not At Risk (08/11/2024)   Epic    Fear of Current or Ex-Partner: No    Emotionally Abused: No    Physically Abused: No    Sexually Abused: No  Depression (PHQ2-9): Low Risk (08/11/2024)   Depression (PHQ2-9)    PHQ-2 Score: 0  Alcohol Screen: Low Risk (08/01/2023)   Alcohol Screen    Last Alcohol Screening Score (AUDIT): 0  Housing: Unknown (08/11/2024)   Epic    Unable to Pay for Housing in the Last Year: No    Number of Times Moved in the Last Year: Not on file    Homeless in the Last Year: No  Utilities: Not At Risk (08/11/2024)   Epic    Threatened with loss of utilities: No  Health Literacy: Adequate Health Literacy (08/11/2024)   B1300 Health Literacy    Frequency of need for help with medical instructions: Never   Family Status  Relation Name Status   Mother Yaqueline Gutter Deceased   Father Morene Adolphus Deceased   Sister  (Not Specified)   Brother  Deceased   Brother Jerilynn Adolphus (Not Specified)   MGM Lola Ostroff (Not Specified)   Daughter benita Fester Deceased at age 31       MI/ stroke   Daughter LUCY Alive   Mat Aunt  (Not Specified)   Oceanographer  (Not Specified)  No partnership data on file   Family History  Problem Relation Age of Onset   Heart failure Mother        after hip fx.    Hypertension Mother    Diabetes Mother    Deep vein thrombosis Mother    Hypertension Father     Diabetes Sister        insulin  dependent   Hypertension Brother    Emphysema Brother    Diabetes Brother        insulin  dependent   Diabetes Maternal Grandmother        non-insulin  dependent   Stroke  Daughter    Heart attack Daughter    Breast cancer Daughter    Brain cancer Daughter    Breast cancer Daughter    Diabetes Maternal Aunt        non-insulent dependent   Heart attack Paternal Aunt    Allergies[2]    Review of Systems  Constitutional:  Negative for fever and malaise/fatigue.  HENT:  Negative for congestion.   Eyes:  Negative for blurred vision.  Respiratory:  Positive for cough and sputum production. Negative for shortness of breath.   Cardiovascular:  Negative for chest pain, palpitations and leg swelling.  Gastrointestinal:  Negative for abdominal pain, blood in stool and nausea.  Genitourinary:  Negative for dysuria and frequency.  Musculoskeletal:  Negative for falls.  Skin:  Negative for rash.  Neurological:  Negative for dizziness, loss of consciousness and headaches.  Endo/Heme/Allergies:  Negative for environmental allergies.  Psychiatric/Behavioral:  Positive for memory loss. Negative for depression. The patient is not nervous/anxious.       Objective:     BP 120/68 (BP Location: Right Arm, Patient Position: Sitting, Cuff Size: Normal)   Pulse 81   Temp 98.2 F (36.8 C) (Oral)   Resp 18   Ht 5' 1 (1.549 m)   Wt 98 lb 6.4 oz (44.6 kg)   SpO2 99%   BMI 18.59 kg/m  BP Readings from Last 3 Encounters:  08/24/24 120/68  08/11/24 120/60  08/10/24 125/68   Wt Readings from Last 3 Encounters:  08/24/24 98 lb 6.4 oz (44.6 kg)  08/11/24 98 lb (44.5 kg)  08/10/24 98 lb (44.5 kg)   SpO2 Readings from Last 3 Encounters:  08/24/24 99%  08/11/24 99%  08/10/24 99%      Physical Exam Vitals and nursing note reviewed.      No results found for any visits on 08/24/24.  Last CBC Lab Results  Component Value Date   WBC 6.5 05/22/2024   HGB  10.1 (L) 05/22/2024   HCT 31.8 (L) 05/22/2024   MCV 92.4 05/22/2024   MCH 29.4 05/22/2024   RDW 14.3 05/22/2024   PLT 216 05/22/2024   Last metabolic panel Lab Results  Component Value Date   GLUCOSE 98 05/22/2024   NA 140 05/22/2024   K 4.3 05/22/2024   CL 107 05/22/2024   CO2 23 05/22/2024   BUN 16 05/22/2024   CREATININE 1.09 (H) 05/22/2024   EGFR 53 (L) 05/22/2024   CALCIUM  8.7 05/22/2024   PROT 5.5 (L) 05/22/2024   ALBUMIN 4.2 04/20/2024   LABGLOB 2.5 05/16/2021   AGRATIO 1.7 05/16/2021   BILITOT 0.3 05/22/2024   ALKPHOS 70 04/20/2024   AST 17 05/22/2024   ALT 23 05/22/2024   ANIONGAP 11 05/13/2024   Last lipids Lab Results  Component Value Date   CHOL 142 01/06/2024   HDL 54.10 01/06/2024   LDLCALC 72 01/06/2024   TRIG 80.0 01/06/2024   CHOLHDL 3 01/06/2024   Last hemoglobin A1c Lab Results  Component Value Date   HGBA1C 6.2 01/06/2024   Last thyroid  functions Lab Results  Component Value Date   TSH 3.21 01/06/2024   T4TOTAL 6.5 06/24/2023   FREET4 0.84 04/22/2017   Last vitamin D  Lab Results  Component Value Date   VD25OH 62.88 10/07/2023   Last vitamin B12 and Folate Lab Results  Component Value Date   VITAMINB12 871 01/06/2024   FOLATE >24.0 02/28/2018      The ASCVD Risk score (Arnett DK, et al., 2019)  failed to calculate for the following reasons:   Risk score cannot be calculated because patient has a medical history suggesting prior/existing ASCVD   * - Cholesterol units were assumed    Assessment & Plan:   Problem List Items Addressed This Visit       Unprioritized   Bronchitis   Relevant Medications   azithromycin  (ZITHROMAX ) 250 MG tablet   Cough - Primary   Relevant Orders   DG Chest 2 View (Completed)   Hyperlipidemia associated with type 2 diabetes mellitus (HCC)   Relevant Orders   Hemoglobin A1c   Comprehensive metabolic panel with GFR   Lipid panel   B12 deficiency   Relevant Orders   Vitamin B12    Hyperlipidemia LDL goal <70   Relevant Orders   CBC with Differential/Platelet   Comprehensive metabolic panel with GFR   Lipid panel   COPD with acute exacerbation (HCC)   Relevant Medications   azithromycin  (ZITHROMAX ) 250 MG tablet   Essential hypertension   Relevant Orders   TSH   Hemoglobin A1c   Vitamin D  deficiency   Relevant Orders   VITAMIN D  25 Hydroxy (Vit-D Deficiency, Fractures)   Mild protein-calorie malnutrition   Pt encouraged to eat 3 meals a day and drink ensure in between.       Memory loss   20/30 mmse Aricept  started  Refer to neurology Mri brain      Relevant Medications   donepezil  (ARICEPT ) 5 MG tablet   Other Relevant Orders   Ambulatory referral to Neurology   IPF (idiopathic pulmonary fibrosis) (HCC)   Per pulmonary      Anemia   Check labs        Assessment and Plan Assessment & Plan COPD with acute exacerbation   She experiences worsening dyspnea and a productive cough with yellow sputum, more severe at night. There is no fever or sinus congestion. Current medications include inhalers and nebulizer treatments. Start Z-Pak (azithromycin ) and Tessalon  Perles for nighttime cough. A chest x-ray and blood work are ordered. Continue current inhalers and nebulizer treatments.  Mild protein-calorie malnutrition   She has decreased appetite and weight loss to 98 pounds, possibly due to congestion affecting taste. She consumes Ensure or Boost between meals. Encourage continued consumption of Ensure or Boost and advise maintaining regular meals despite decreased appetite.  Cognitive impairment   She has memory issues with occasional forgetfulness and repetition of stories, causing family concern about progression. Arrange cognitive testing.  General Health Maintenance   A flu shot is recommended but deferred due to current illness. Advise receiving the flu shot when feeling better.   No follow-ups on file.    Jamee JONELLE Antonio Cyndee, DO      [1]  Social History Tobacco Use   Smoking status: Every Day    Current packs/day: 0.25    Average packs/day: 0.3 packs/day for 51.0 years (12.8 ttl pk-yrs)    Types: Cigarettes    Passive exposure: Current   Smokeless tobacco: Never   Tobacco comments:    1-2 cigarettes per day KD-10/31/23  Vaping Use   Vaping status: Never Used  Substance Use Topics   Alcohol use: No    Alcohol/week: 0.0 standard drinks of alcohol   Drug use: No  [2] No Known Allergies

## 2024-08-25 ENCOUNTER — Encounter: Payer: Self-pay | Admitting: Physician Assistant

## 2024-08-28 ENCOUNTER — Encounter: Payer: Self-pay | Admitting: Family Medicine

## 2024-09-05 ENCOUNTER — Other Ambulatory Visit: Payer: Self-pay | Admitting: Pulmonary Disease

## 2024-09-22 ENCOUNTER — Other Ambulatory Visit: Payer: Self-pay | Admitting: Family Medicine

## 2024-09-22 DIAGNOSIS — I10 Essential (primary) hypertension: Secondary | ICD-10-CM

## 2024-10-14 ENCOUNTER — Ambulatory Visit: Admitting: Pulmonary Disease

## 2024-10-29 ENCOUNTER — Encounter: Payer: Self-pay | Admitting: Physician Assistant

## 2024-11-04 ENCOUNTER — Ambulatory Visit: Admitting: Pulmonary Disease

## 2024-11-27 ENCOUNTER — Ambulatory Visit: Admitting: Family Medicine

## 2025-08-17 ENCOUNTER — Ambulatory Visit
